# Patient Record
Sex: Female | Born: 1950 | Race: White | Hispanic: No | Marital: Married | Smoking: Never smoker
Health system: Southern US, Community
[De-identification: ages and names within clinical notes are randomized; demographics above are authoritative.]

## PROBLEM LIST (undated history)

## (undated) DIAGNOSIS — I82409 Acute embolism and thrombosis of unspecified deep veins of unspecified lower extremity: Secondary | ICD-10-CM

## (undated) DIAGNOSIS — K219 Gastro-esophageal reflux disease without esophagitis: Secondary | ICD-10-CM

## (undated) DIAGNOSIS — R112 Nausea with vomiting, unspecified: Secondary | ICD-10-CM

## (undated) DIAGNOSIS — J189 Pneumonia, unspecified organism: Secondary | ICD-10-CM

## (undated) DIAGNOSIS — E119 Type 2 diabetes mellitus without complications: Secondary | ICD-10-CM

## (undated) DIAGNOSIS — F32A Depression, unspecified: Secondary | ICD-10-CM

## (undated) DIAGNOSIS — U071 COVID-19: Secondary | ICD-10-CM

## (undated) DIAGNOSIS — I499 Cardiac arrhythmia, unspecified: Secondary | ICD-10-CM

## (undated) DIAGNOSIS — F419 Anxiety disorder, unspecified: Secondary | ICD-10-CM

## (undated) DIAGNOSIS — R51 Headache: Secondary | ICD-10-CM

## (undated) DIAGNOSIS — Z9889 Other specified postprocedural states: Secondary | ICD-10-CM

## (undated) DIAGNOSIS — J45909 Unspecified asthma, uncomplicated: Secondary | ICD-10-CM

## (undated) DIAGNOSIS — R519 Headache, unspecified: Secondary | ICD-10-CM

## (undated) DIAGNOSIS — M1990 Unspecified osteoarthritis, unspecified site: Secondary | ICD-10-CM

## (undated) DIAGNOSIS — T4145XA Adverse effect of unspecified anesthetic, initial encounter: Secondary | ICD-10-CM

## (undated) DIAGNOSIS — T8859XA Other complications of anesthesia, initial encounter: Secondary | ICD-10-CM

## (undated) HISTORY — PX: EYE SURGERY: SHX253

## (undated) HISTORY — PX: FINGER SURGERY: SHX640

## (undated) HISTORY — PX: ABDOMINAL HYSTERECTOMY: SHX81

## (undated) HISTORY — PX: TOTAL HIP ARTHROPLASTY: SHX124

## (undated) HISTORY — PX: SHOULDER ARTHROSCOPY: SHX128

---

## 1998-12-07 ENCOUNTER — Ambulatory Visit (HOSPITAL_BASED_OUTPATIENT_CLINIC_OR_DEPARTMENT_OTHER): Admission: RE | Admit: 1998-12-07 | Discharge: 1998-12-07 | Payer: Self-pay | Admitting: Orthopedic Surgery

## 1998-12-29 ENCOUNTER — Ambulatory Visit (HOSPITAL_BASED_OUTPATIENT_CLINIC_OR_DEPARTMENT_OTHER): Admission: RE | Admit: 1998-12-29 | Discharge: 1998-12-29 | Payer: Self-pay | Admitting: Orthopedic Surgery

## 1999-01-14 ENCOUNTER — Encounter: Admission: RE | Admit: 1999-01-14 | Discharge: 1999-03-02 | Payer: Self-pay | Admitting: Family Medicine

## 1999-09-28 ENCOUNTER — Encounter: Payer: Self-pay | Admitting: Orthopedic Surgery

## 1999-10-04 ENCOUNTER — Inpatient Hospital Stay (HOSPITAL_COMMUNITY): Admission: RE | Admit: 1999-10-04 | Discharge: 1999-10-07 | Payer: Self-pay | Admitting: Orthopedic Surgery

## 1999-11-05 ENCOUNTER — Encounter: Admission: RE | Admit: 1999-11-05 | Discharge: 2000-02-03 | Payer: Self-pay | Admitting: Orthopedic Surgery

## 2000-03-20 ENCOUNTER — Inpatient Hospital Stay (HOSPITAL_COMMUNITY): Admission: RE | Admit: 2000-03-20 | Discharge: 2000-03-23 | Payer: Self-pay | Admitting: Orthopedic Surgery

## 2000-04-18 ENCOUNTER — Encounter: Admission: RE | Admit: 2000-04-18 | Discharge: 2000-05-22 | Payer: Self-pay | Admitting: Orthopedic Surgery

## 2000-05-16 HISTORY — PX: JOINT REPLACEMENT: SHX530

## 2001-02-28 ENCOUNTER — Encounter: Payer: Self-pay | Admitting: Orthopedic Surgery

## 2001-02-28 ENCOUNTER — Inpatient Hospital Stay (HOSPITAL_COMMUNITY): Admission: RE | Admit: 2001-02-28 | Discharge: 2001-03-02 | Payer: Self-pay | Admitting: Orthopedic Surgery

## 2005-04-05 ENCOUNTER — Encounter: Admission: RE | Admit: 2005-04-05 | Discharge: 2005-04-12 | Payer: Self-pay | Admitting: Orthopedic Surgery

## 2005-08-24 ENCOUNTER — Encounter (HOSPITAL_COMMUNITY): Admission: RE | Admit: 2005-08-24 | Discharge: 2005-09-23 | Payer: Self-pay | Admitting: Orthopedic Surgery

## 2012-12-24 ENCOUNTER — Emergency Department (HOSPITAL_COMMUNITY)
Admission: EM | Admit: 2012-12-24 | Discharge: 2012-12-24 | Disposition: A | Discharge status: Home / Self Care | Payer: Medicare Other | Attending: Emergency Medicine | Admitting: Emergency Medicine

## 2012-12-24 ENCOUNTER — Encounter (HOSPITAL_COMMUNITY): Payer: Self-pay | Admitting: *Deleted

## 2012-12-24 ENCOUNTER — Emergency Department (HOSPITAL_COMMUNITY): Payer: Medicare Other

## 2012-12-24 ENCOUNTER — Encounter (HOSPITAL_COMMUNITY): Payer: Self-pay | Admitting: Registered Nurse

## 2012-12-24 ENCOUNTER — Emergency Department (HOSPITAL_COMMUNITY): Payer: Medicare Other | Admitting: Registered Nurse

## 2012-12-24 ENCOUNTER — Encounter (HOSPITAL_COMMUNITY)
Admission: EM | Disposition: A | Discharge status: Home / Self Care | Payer: Self-pay | Source: Home / Self Care | Attending: Emergency Medicine

## 2012-12-24 DIAGNOSIS — S73004A Unspecified dislocation of right hip, initial encounter: Secondary | ICD-10-CM

## 2012-12-24 DIAGNOSIS — X58XXXA Exposure to other specified factors, initial encounter: Secondary | ICD-10-CM | POA: Insufficient documentation

## 2012-12-24 DIAGNOSIS — Z96649 Presence of unspecified artificial hip joint: Secondary | ICD-10-CM | POA: Insufficient documentation

## 2012-12-24 DIAGNOSIS — T84029A Dislocation of unspecified internal joint prosthesis, initial encounter: Secondary | ICD-10-CM | POA: Insufficient documentation

## 2012-12-24 HISTORY — PX: HIP CLOSED REDUCTION: SHX983

## 2012-12-24 HISTORY — DX: Type 2 diabetes mellitus without complications: E11.9

## 2012-12-24 LAB — GLUCOSE, CAPILLARY: Glucose-Capillary: 144 mg/dL — ABNORMAL HIGH (ref 70–99)

## 2012-12-24 SURGERY — CLOSED REDUCTION, HIP
Anesthesia: General | Site: Hip | Laterality: Right | Wound class: Clean

## 2012-12-24 MED ORDER — SUCCINYLCHOLINE CHLORIDE 20 MG/ML IJ SOLN
INTRAMUSCULAR | Status: DC | PRN
  Administered 2012-12-24: 140 mg via INTRAVENOUS

## 2012-12-24 MED ORDER — FENTANYL CITRATE 0.05 MG/ML IJ SOLN: 25.0000 ug | INTRAMUSCULAR | Status: DC | PRN

## 2012-12-24 MED ORDER — PROMETHAZINE HCL 25 MG/ML IJ SOLN: 6.2500 mg | INTRAMUSCULAR | Status: DC | PRN

## 2012-12-24 MED ORDER — PROPOFOL 10 MG/ML IV BOLUS
0.5000 mg/kg | Freq: Once | INTRAVENOUS | Status: AC
  Administered 2012-12-24: 120 mg via INTRAVENOUS
  Filled 2012-12-24: qty 1

## 2012-12-24 MED ORDER — PROPOFOL 10 MG/ML IV BOLUS
INTRAVENOUS | Status: DC | PRN
  Administered 2012-12-24: 200 mg via INTRAVENOUS

## 2012-12-24 MED ORDER — HYDROMORPHONE HCL PF 1 MG/ML IJ SOLN
1.0000 mg | Freq: Once | INTRAMUSCULAR | Status: AC
  Administered 2012-12-24: 1 mg via INTRAVENOUS
  Filled 2012-12-24: qty 1

## 2012-12-24 MED ORDER — LACTATED RINGERS IV SOLN
INTRAVENOUS | Status: DC | PRN
  Administered 2012-12-24: 22:00:00 via INTRAVENOUS

## 2012-12-24 MED ORDER — ONDANSETRON HCL 4 MG/2ML IJ SOLN
INTRAMUSCULAR | Status: DC | PRN
  Administered 2012-12-24: 4 mg via INTRAVENOUS

## 2012-12-24 MED ORDER — ONDANSETRON HCL 4 MG/2ML IJ SOLN
4.0000 mg | Freq: Once | INTRAMUSCULAR | Status: AC
  Administered 2012-12-24: 4 mg via INTRAVENOUS
  Filled 2012-12-24: qty 2

## 2012-12-24 MED ORDER — LIDOCAINE HCL (CARDIAC) 20 MG/ML IV SOLN
INTRAVENOUS | Status: DC | PRN
  Administered 2012-12-24: 30 mg via INTRAVENOUS

## 2012-12-24 SURGICAL SUPPLY — 29 items
BAG ZIPLOCK 12X15 (MISCELLANEOUS) IMPLANT
CLOTH BEACON ORANGE TIMEOUT ST (SAFETY) IMPLANT
CUFF TOURN SGL QUICK 34 (TOURNIQUET CUFF)
CUFF TRNQT CYL 34X4X40X1 (TOURNIQUET CUFF) IMPLANT
DRAPE C-ARM 42X120 X-RAY (DRAPES) IMPLANT
DRAPE U-SHAPE 47X51 STRL (DRAPES) IMPLANT
DRSG ADAPTIC 3X8 NADH LF (GAUZE/BANDAGES/DRESSINGS) IMPLANT
DRSG PAD ABDOMINAL 8X10 ST (GAUZE/BANDAGES/DRESSINGS) IMPLANT
DURAPREP 26ML APPLICATOR (WOUND CARE) IMPLANT
ELECT REM PT RETURN 9FT ADLT (ELECTROSURGICAL)
ELECTRODE REM PT RTRN 9FT ADLT (ELECTROSURGICAL) IMPLANT
GLOVE ECLIPSE 7.5 STRL STRAW (GLOVE) IMPLANT
IMMOBILIZER KNEE 20 (SOFTGOODS) ×2
IMMOBILIZER KNEE 20 THIGH 36 (SOFTGOODS) ×1 IMPLANT
KIT BASIN OR (CUSTOM PROCEDURE TRAY) IMPLANT
MANIFOLD NEPTUNE II (INSTRUMENTS) IMPLANT
NS IRRIG 1000ML POUR BTL (IV SOLUTION) IMPLANT
PACK LOWER EXTREMITY WL (CUSTOM PROCEDURE TRAY) IMPLANT
PAD CAST 4YDX4 CTTN HI CHSV (CAST SUPPLIES) IMPLANT
PADDING CAST COTTON 4X4 STRL (CAST SUPPLIES)
POSITIONER SURGICAL ARM (MISCELLANEOUS) IMPLANT
SOL PREP PROV IODINE SCRUB 4OZ (MISCELLANEOUS) IMPLANT
SPONGE GAUZE 4X4 12PLY (GAUZE/BANDAGES/DRESSINGS) IMPLANT
STRIP CLOSURE SKIN 1/2X4 (GAUZE/BANDAGES/DRESSINGS) IMPLANT
SUT MNCRL AB 4-0 PS2 18 (SUTURE) IMPLANT
SUT VIC AB 1 CT1 27 (SUTURE)
SUT VIC AB 1 CT1 27XBRD ANTBC (SUTURE) IMPLANT
SUT VIC AB 2-0 CT1 27 (SUTURE)
SUT VIC AB 2-0 CT1 TAPERPNT 27 (SUTURE) IMPLANT

## 2012-12-24 NOTE — ED Notes (Signed)
Per ems: pt from home, slipped and fell on her porch today. Right hip pain. Deformity to hip, shortening and rotation noted. 10 mg morphine given in route. Hx of right hip replacement. bp 102/70, pulse 74, respirations 14, sao2 98%, cbg 134

## 2012-12-24 NOTE — Consult Note (Signed)
Reason for Consult: R hip dislocation Referring Physician: ED Anne Shutter  Regina Munoz is an 62 y.o. female.  HPI: She presented to the ED today by EMS after slipping on her porch and falling onto her R hip. She had immediate onset of pain and could not rise up or bare weight on her RLE. Her husband called EMS and she was transported to the ED. Radiographs were obtained and she was found to have R hip dislocation and ortho was consulted. She reports pain about the R hip and groin but denies radiation down the leg. Denies numbness or tingling. She denies LOC. She describes her R hip pain as severe, sharp, stabbing and aching. She cannot move the RLE without pain. She reports having a R THR approx 10 years ago and has no history of dislocations in the past. The ED has attempted to reduce without success. Pain in currently minimal and has improved.   Past Medical History  Diagnosis Date  . Diabetes mellitus without complication     Past Surgical History  Procedure Laterality Date  . Total hip arthroplasty    . Abdominal hysterectomy    . Joint replacement      right and left knees    History reviewed. No pertinent family history.  Social History:  reports that she has never smoked. She does not have any smokeless tobacco history on file. She reports that she does not drink alcohol or use illicit drugs.She lives at home with her husband and is retired  Allergies: No Known Allergies  Medications: No current facility-administered medications for this encounter. Current outpatient prescriptions:aspirin EC 81 MG tablet, Take 81 mg by mouth daily., Disp: , Rfl: ;  cholecalciferol (VITAMIN D) 1000 UNITS tablet, Take 1,000 Units by mouth daily., Disp: , Rfl: ;  citalopram (CELEXA) 20 MG tablet, Take 20 mg by mouth daily., Disp: , Rfl: ;  linagliptin (TRADJENTA) 5 MG TABS tablet, Take 5 mg by mouth daily., Disp: , Rfl: ;  omeprazole (PRILOSEC) 20 MG capsule, Take 20 mg by mouth daily., Disp: ,  Rfl:   BP 125/56  Pulse 80  Temp(Src) 97.7 F (36.5 C) (Oral)  Resp 15  Ht 5\' 5"  (1.651 m)  Wt 117.935 kg (260 lb)  BMI 43.27 kg/m2  SpO2 99%   Dg Hip Complete Right  12/24/2012   *RADIOLOGY REPORT*  Clinical Data: Fall.  Right hip pain.  RIGHT HIP - COMPLETE 2+ VIEW  Comparison: None.  Findings: Posterior right hip dislocation is present.  Right total hip arthroplasty.  Pelvic rings grossly appear intact.  No periprosthetic fracture is identified.  IMPRESSION: Posterior dislocation of the right total hip arthroplasty.   Original Report Authenticated By: Andreas Newport, M.D.    Review of Systems  Constitutional: Negative for fever, chills and malaise/fatigue.  Musculoskeletal: Positive for joint pain and falls.  Skin: Negative.   Neurological: Negative for dizziness and weakness.   Blood pressure 118/63, pulse 79, temperature 97.7 F (36.5 C), temperature source Oral, resp. rate 18, height 5\' 5"  (1.651 m), weight 117.935 kg (260 lb), SpO2 97.00%. Physical Exam  Constitutional: She is oriented to person, place, and time. She appears well-developed and well-nourished. No distress.  HENT:  Head: Normocephalic and atraumatic.  Eyes: EOM are normal.  Neck: Normal range of motion.  Cardiovascular: Intact distal pulses.   Respiratory: Effort normal.  Musculoskeletal:       Right hip: She exhibits decreased range of motion, decreased strength, tenderness and deformity. She  exhibits no bony tenderness, no swelling and no laceration.  R leg shortened and rotated, R hip displaced  Neurological: She is alert and oriented to person, place, and time.  Skin: Skin is warm and dry. She is not diaphoretic. No erythema.  Psychiatric: She has a normal mood and affect. Her behavior is normal. Judgment and thought content normal.    Assessment/Plan: R posterior hip displacement S/P R THA by Dr Turner Daniels 10 yrs previous, failure to reduce in OR  -Plan to take to OR for reduction under anesthesia. I  did discuss risks of failure of reduction, nerve injury, recurrent dislocation, fracture, and vascular injury  -F/U with Dr. Turner Daniels in the office in 1-2 weeks  Regina Munoz 12/24/2012, 9:37 PM

## 2012-12-24 NOTE — ED Notes (Signed)
Pt is awake and alert and complaining right hip pain..  Unable to to do closed reduction of right hip-Dr. Denton Lank made aware pt requesting pain meds.  Husband at bedside. VSS

## 2012-12-24 NOTE — ED Provider Notes (Signed)
CSN: [id number]     Arrival date & time 12/24/12  1836 History     First MD Initiated Contact with Patient 12/24/12 1840     Chief Complaint  Patient presents with  . Hip Injury  . Fall   (Consider location/radiation/quality/duration/timing/severity/associated sxs/prior Treatment) HPI Comments: Patient with a history of right hip replacement presents with a chief complaint of right hip pain and deformity.  She states that just prior to arrival she slipped on wet pavement and fell to the ground.  When she fell her right leg went behind her.  She has not been able to move her right hip since that time.  She has not been ambulatory since the fall.  She denies hitting her head.  No LOC.  She is not having pain anywhere aside from the right hip.  She denies numbness or tingling.  She reports that her right hip replacement was performed by Dr. Turner Daniels approximately ten years ago.  She denies any prior hip dislocations.    The history is provided by the patient.    Past Medical History  Diagnosis Date  . Diabetes mellitus without complication    Past Surgical History  Procedure Laterality Date  . Total hip arthroplasty    . Abdominal hysterectomy     No family history on file. History  Substance Use Topics  . Smoking status: Never Smoker   . Smokeless tobacco: Not on file  . Alcohol Use: No   OB History   Grav Para Term Preterm Abortions TAB SAB Ect Mult Living                 Review of Systems  Musculoskeletal:       Right hip pain    Allergies  Review of patient's allergies indicates no known allergies.  Home Medications   Current Outpatient Rx  Name  Route  Sig  Dispense  Refill  . aspirin EC 81 MG tablet   Oral   Take 81 mg by mouth daily.         . cholecalciferol (VITAMIN D) 1000 UNITS tablet   Oral   Take 1,000 Units by mouth daily.         . citalopram (CELEXA) 20 MG tablet   Oral   Take 20 mg by mouth daily.         Marland Kitchen linagliptin (TRADJENTA) 5 MG  TABS tablet   Oral   Take 5 mg by mouth daily.         Marland Kitchen omeprazole (PRILOSEC) 20 MG capsule   Oral   Take 20 mg by mouth daily.          BP 110/75  Pulse 83  Temp(Src) 97.7 F (36.5 C) (Oral)  Resp 18  Ht 5\' 5"  (1.651 m)  Wt 260 lb (117.935 kg)  BMI 43.27 kg/m2  SpO2 97% Physical Exam  Nursing note and vitals reviewed. Constitutional: She appears well-developed and well-nourished.  HENT:  Head: Normocephalic and atraumatic.  Eyes: EOM are normal. Pupils are equal, round, and reactive to light.  Neck: Normal range of motion. Neck supple.  Cardiovascular: Normal rate, regular rhythm and normal heart sounds.   Pulses:      Dorsalis pedis pulses are 2+ on the right side, and 2+ on the left side.  Pulmonary/Chest: Effort normal and breath sounds normal.  Musculoskeletal:       Right hip: She exhibits decreased range of motion, bony tenderness and deformity. She exhibits no laceration.  Right leg shortened and rotated.    Neurological: She is alert.  Sensation of right foot intact  Skin: Skin is warm and dry.  Psychiatric: She has a normal mood and affect.    ED Course   Reduction of dislocation Date/Time: 12/25/2012 12:50 AM Performed by: Suzi Roots Authorized by: Anne Shutter, Herbert Seta Consent: Verbal consent obtained. written consent obtained. Risks and benefits: risks, benefits and alternatives were discussed Consent given by: patient Patient understanding: patient states understanding of the procedure being performed Patient consent: the patient's understanding of the procedure matches consent given Patient identity confirmed: verbally with patient and arm band Time out: Immediately prior to procedure a "time out" was called to verify the correct patient, procedure, equipment, support staff and site/side marked as required. Local anesthesia used: no Patient sedated: yes Sedation type: moderate (conscious) sedation Sedatives: propofol Vitals: Vital signs  were monitored during sedation. Patient tolerance: Patient tolerated the procedure well with no immediate complications.   (including critical care time)  Labs Reviewed - No data to display Dg Hip Complete Right  12/24/2012   *RADIOLOGY REPORT*  Clinical Data: Fall.  Right hip pain.  RIGHT HIP - COMPLETE 2+ VIEW  Comparison: None.  Findings: Posterior right hip dislocation is present.  Right total hip arthroplasty.  Pelvic rings grossly appear intact.  No periprosthetic fracture is identified.  IMPRESSION: Posterior dislocation of the right total hip arthroplasty.   Original Report Authenticated By: Andreas Newport, M.D.   No diagnosis found.  9:00 PM Discussed with Dr. Yevette Edwards with Orthopedics.  He states that he will arrange hip reduction in the OR.  MDM  Patient with a history of right hip replacement presents with right hip dislocation.  Patient neurovascularly intact.  Hip dislocation reduction attempted by Dr. Denton Lank without success.  Dr. Yevette Edwards consulted to perform reduction in the OR.  Pascal Lux [place], PA-C 12/25/12 [phone]

## 2012-12-24 NOTE — Anesthesia Preprocedure Evaluation (Addendum)
Anesthesia Evaluation  Patient identified by MRN, date of birth, ID band Patient awake    Reviewed: Allergy & Precautions, H&P , NPO status , Patient's Chart, lab work & pertinent test results  History of Anesthesia Complications (+) MALIGNANT HYPERTHERMIA  Airway Mallampati: II TM Distance: <3 FB Neck ROM: Full    Dental no notable dental hx.    Pulmonary neg pulmonary ROS,  breath sounds clear to auscultation  Pulmonary exam normal       Cardiovascular negative cardio ROS  Rhythm:Regular Rate:Normal     Neuro/Psych negative neurological ROS  negative psych ROS   GI/Hepatic negative GI ROS, Neg liver ROS,   Endo/Other  diabetesMorbid obesity  Renal/GU negative Renal ROS  negative genitourinary   Musculoskeletal negative musculoskeletal ROS (+)   Abdominal   Peds negative pediatric ROS (+)  Hematology negative hematology ROS (+)   Anesthesia Other Findings   Reproductive/Obstetrics negative OB ROS                          Anesthesia Physical Anesthesia Plan  ASA: III  Anesthesia Plan: General   Post-op Pain Management:    Induction: Intravenous  Airway Management Planned: Oral ETT  Additional Equipment:   Intra-op Plan:   Post-operative Plan:   Informed Consent: I have reviewed the patients History and Physical, chart, labs and discussed the procedure including the risks, benefits and alternatives for the proposed anesthesia with the patient or authorized representative who has indicated his/her understanding and acceptance.   Dental advisory given  Plan Discussed with: CRNA and Surgeon  Anesthesia Plan Comments:        Anesthesia Quick Evaluation

## 2012-12-24 NOTE — Transfer of Care (Signed)
Immediate Anesthesia Transfer of Care Note  Patient: Regina Munoz  Procedure(s) Performed: Procedure(s): CLOSED REDUCTION HIP (Right)  Patient Location: PACU  Anesthesia Type:General  Level of Consciousness: awake, alert  and oriented  Airway & Oxygen Therapy: Patient Spontanous Breathing and Patient connected to face mask oxygen  Post-op Assessment: Report given to PACU RN and Post -op Vital signs reviewed and stable  Post vital signs: stable  Complications: No apparent anesthesia complications

## 2012-12-24 NOTE — ED Notes (Signed)
Pt remains awake and alert-MP NSR with rate 80 and no ectopy-O2 sat 100 % on O2 5l/[place] BP 117/63-IV intact right ACF/husband at bedside

## 2012-12-24 NOTE — ED Notes (Signed)
Bed:[facility]ALA<BR> Expected date:<BR> Expected time:<BR> Means of arrival:<BR> Comments:<BR> ems- right hip pain

## 2012-12-24 NOTE — ED Notes (Signed)
Pt received total 120 mg propofol IV during procedure

## 2012-12-24 NOTE — Progress Notes (Signed)
Pt does not use a computer. Pt does not want to sign up for My Chart. Briscoe Burns BSN, RN-BC Admissions RN  12/24/2012 9:09 PM

## 2012-12-24 NOTE — Preoperative (Signed)
Beta Blockers   Reason not to administer Beta Blockers:Not Applicable 

## 2012-12-25 ENCOUNTER — Encounter (HOSPITAL_COMMUNITY): Payer: Self-pay | Admitting: Orthopedic Surgery

## 2012-12-25 MED ORDER — MIDAZOLAM HCL 5 MG/5ML IJ SOLN
INTRAMUSCULAR | Status: DC | PRN
  Administered 2012-12-24: 2 mg via INTRAVENOUS

## 2012-12-25 NOTE — Op Note (Signed)
NAME:  Regina Munoz, Regina Munoz NO.:  [id number]  MEDICAL RECORD NO.:  [id number]  LOCATION:  [facility]                         FACILITY:  [facility]  PHYSICIAN:  Estill Bamberg, MD      DATE OF BIRTH:  Jul 05, 1950  DATE OF PROCEDURE:  12/24/2012 DATE OF DISCHARGE:  12/24/2012                              OPERATIVE REPORT   PREOPERATIVE DIAGNOSIS:  Posterior right hip dislocation.  POSTOPERATIVE DIAGNOSIS:  Posterior right hip dislocation.  PROCEDURE:  Reduction of dislocated right total hip arthroplasty with general anesthesia.  SURGEON:  Estill Bamberg, MD  ASSISTANT:  None.  ANESTHESIA:  General endotracheal anesthesia.  COMPLICATIONS:  None.  DISPOSITION:  Stable.  ESTIMATED BLOOD LOSS:  Minimal.  INDICATIONS FOR PROCEDURE:  Briefly, [name] is a pleasant 62 year old female who is approximately 10 years status post a right total hip replacement.  The patient did slip on a wet surface earlier today.  Her right lower extremity did perform a twisting maneuver.  She did notice an inability to bear weight and a deformity of her right lower extremity was noted.  After the patient's presentation to the [facility] Emergency Department, she was evaluated by me.  Radiographs did reveal right posterior hip dislocation.  I did discuss a closed reduction and the patient did agree to proceed.  OPERATIVE DETAILS:  On December 24, 2012, the patient was brought to surgery and general endotracheal anesthesia was administered.  In the supine position, with an assistant holding counter pressure against the patient's pelvis, I did internally rotate the right hip, applied traction, and adducted the hip.  I then externally rotated the hip and I did hear an audible clunk.  At this point, the patient's leg lengths were symmetric.  I then obtained an AP of the pelvis using intraoperative fluoroscopy, and a concentric reduction of the patient's right total hip arthroplasty was noted.  At  this point, the patient was awoken from general endotracheal anesthesia and transferred to recovery in stable condition.  On the patient's postoperative examination, she was noted to be neurovascularly intact.  Postoperative plan is to discharge the patient with follow up with Dr. Turner Daniels in approximately 2 weeks.     Estill Bamberg, MD     MD/MEDQ  D:  12/24/2012  T:  12/25/2012  Job:  [id number]  cc:   Feliberto Gottron. Turner Daniels, M.D. Fax: [phone]

## 2012-12-25 NOTE — ED Provider Notes (Signed)
Medical screening examination/treatment/procedure(s) were conducted as a shared visit with non-physician practitioner(s) and myself.  I personally evaluated the patient during the encounter Pt s/p slip and fall. No loc. C/o right hip pain. Remote hx right tha. Dislocation on xr.  w procedural sedation, propofol, excellent sedation/relaxation, but unable to reduce hip.  Recheck, distal pulses palp, no numbness/weakness, RLE motor/sens intact. Ortho consulted - to or for reduction.   Suzi Roots, MD 12/25/12 2141

## 2012-12-27 NOTE — Anesthesia Postprocedure Evaluation (Signed)
  Anesthesia Post-op Note  Patient: Regina Munoz  Procedure(s) Performed: Procedure(s) (LRB): CLOSED REDUCTION HIP (Right)  Patient Location: PACU  Anesthesia Type: General  Level of Consciousness: awake and alert   Airway and Oxygen Therapy: Patient Spontanous Breathing  Post-op Pain: mild  Post-op Assessment: Post-op Vital signs reviewed, Patient's Cardiovascular Status Stable, Respiratory Function Stable, Patent Airway and No signs of Nausea or vomiting  Last Vitals:  Filed Vitals:   12/24/12 2330  BP: 116/61  Pulse: 72  Temp: 36.5 C  Resp: 18    Post-op Vital Signs: stable   Complications: No apparent anesthesia complications

## 2015-01-13 ENCOUNTER — Ambulatory Visit: Payer: Medicare Other | Attending: Orthopedic Surgery | Admitting: Physical Therapy

## 2015-01-13 DIAGNOSIS — M25552 Pain in left hip: Secondary | ICD-10-CM | POA: Diagnosis present

## 2015-01-13 NOTE — Therapy (Signed)
[facility] [address] [place], [place], [address] Phone: [phone]   Fax:  [phone]  Physical Therapy Evaluation  Patient Details  Name: Regina Munoz MRN: [id number] Date of Birth: Jan 09, 1951 Referring Provider:  Gean Birchwood, MD  Encounter Date: 01/13/2015      PT End of Session - 01/13/15 1439    Visit Number 1   Number of Visits 8   Date for PT Re-Evaluation 02/17/15   PT Start Time 1123   PT Stop Time 1207   PT Time Calculation (min) 44 min   Activity Tolerance Patient tolerated treatment well   Behavior During Therapy [facility] for tasks assessed/performed      Past Medical History  Diagnosis Date  . Diabetes mellitus without complication     Past Surgical History  Procedure Laterality Date  . Total hip arthroplasty    . Abdominal hysterectomy    . Joint replacement      right and left knees  . Hip closed reduction Right 12/24/2012    Procedure: CLOSED REDUCTION HIP;  Surgeon: Emilee Hero, MD;  Location: [facility];  Service: Orthopedics;  Laterality: Right;    There were no vitals filed for this visit.  Visit Diagnosis:  Left hip pain - Plan: PT plan of care cert/re-cert      Subjective Assessment - 01/13/15 1422    Subjective Sometimes the pain hits me so hard in my hip my legs give way.              [facility] PT Assessment - 01/13/15 0001    Assessment   Medical Diagnosis Trochanteric bursitis.   Onset Date/Surgical Date --  Several months.   Precautions   Precautions None   Restrictions   Weight Bearing Restrictions No   Balance Screen   Has the patient fallen in the past 6 months Yes   How many times? --  2   Has the patient had a decrease in activity level because of a fear of falling?  Yes   Is the patient reluctant to leave their home because of a fear of falling?  No   Home [employer] information [employer] manager residence   Prior Function   Level of Independence Independent   ROM / Strength   AROM / PROM / Strength AROM;Strength   AROM   Overall AROM Comments WFL for left hip.   Strength   Overall Strength Comments Left hip abduction= 4/5.   Palpation   Palpation comment Pain complaints "in" hip but no palpable pain around the patient's left hip.   Special Tests    Special Tests --  Positive left FABER test.   Ambulation/Gait   Gait Comments Trendelenburg type gait pattern.                   [facility] Adult PT Treatment/Exercise - 01/13/15 0001    Modalities   Modalities Ultrasound   Ultrasound   Ultrasound Location Left hip.   Ultrasound Parameters 1.50 W/CM2 x 10 minutes.  Patient was in the left sdly position with folded pillow between knees for comfort.                  PT Short Term Goals - 01/13/15 1435    PT SHORT TERM GOAL #1   Title Ind with HEP.   Time 2   Period Weeks   Status New           PT Long Term Goals -  2015-01-29 1435    PT LONG TERM GOAL #1   Title Perform ADL's with pain not > 3/10 in left hip.   Time 4   Period Weeks   Status New   PT LONG TERM GOAL #2   Title No episodes of intense pain causing legs to "give way."   Time 4   Period Weeks   Status New               Plan - 2015-01-29 1427    Clinical Impression Statement The patient reports a pain occuring in her left hip over the last several months that will come without notice and has been so intense that her knees have given way on 2 occasions.  When this occurs she states her pain is a 10/10.  She reports no areas around her left hip that she can touch that feels painful.  When asked if she has a h/o low back problems she stated "yes" and has had injections in the past.  She is not reporting any significant LBP today however.  She is not palpable tender in her low back or left SIJ region.   PT Frequency 2x / week   PT Duration 4 weeks   PT Treatment/Interventions Ultrasound;Iontophoresis 4mg /ml Dexamethasone;Manual techniques   PT Next  Visit Plan 1-1 left hip PROM (IR/ER); U/S and Ionto.          G-Codes - 29-Jan-2015 1413    Functional Assessment Tool Used Clinical judgement.   Functional Limitation Mobility: Walking and moving around   Mobility: Walking and Moving Around Current Status [phone]) At least 20 percent but less than 40 percent impaired, limited or restricted   Mobility: Walking and Moving Around Goal Status [phone]) At least 1 percent but less than 20 percent impaired, limited or restricted       Problem List There are no active problems to display for this patient.   Miklo Aken, [place] MPT 01/29/15, 2:42 PM  [facility] [address] [place], [place], [address] Phone: [phone]   Fax:  [phone]

## 2015-01-15 ENCOUNTER — Ambulatory Visit: Payer: Medicare Other | Attending: Orthopedic Surgery | Admitting: *Deleted

## 2015-01-15 ENCOUNTER — Encounter: Payer: Self-pay | Admitting: *Deleted

## 2015-01-15 DIAGNOSIS — M25552 Pain in left hip: Secondary | ICD-10-CM | POA: Insufficient documentation

## 2015-01-15 NOTE — Therapy (Signed)
[facility] [address] [place], [place], [address] Phone: [phone]   Fax:  [phone]  Physical Therapy Treatment  Patient Details  Name: Regina Munoz MRN: [id number] Date of Birth: 21-Nov-1950 Referring Provider:  Gean Birchwood, MD  Encounter Date: 01/15/2015      PT End of Session - 01/15/15 1130    Visit Number 2   Number of Visits 8   Date for PT Re-Evaluation 02/17/15   PT Start Time 1115   PT Stop Time 1200   PT Time Calculation (min) 45 min      Past Medical History  Diagnosis Date  . Diabetes mellitus without complication     Past Surgical History  Procedure Laterality Date  . Total hip arthroplasty    . Abdominal hysterectomy    . Joint replacement      right and left knees  . Hip closed reduction Right 12/24/2012    Procedure: CLOSED REDUCTION HIP;  Surgeon: Emilee Hero, MD;  Location: [facility];  Service: Orthopedics;  Laterality: Right;    There were no vitals filed for this visit.  Visit Diagnosis:  Left hip pain      Subjective Assessment - 01/15/15 1128    Subjective Sometimes the pain hits me so hard in my hip my  LT leg gives way.     Currently in Pain? No/denies  No pain most of the time                         [facility] Adult PT Treatment/Exercise - 01/15/15 0001    Modalities   Modalities Ultrasound;Iontophoresis   Ultrasound   Ultrasound Location LT knee   Ultrasound Parameters 1.5 w/cm2 x10 mins RT sidelying   Iontophoresis   Type of Iontophoresis Dexamethasone   Location LT hip   Dose 2 ML, 80 ma/min   Time 8 min,  4 hour patch   Manual Therapy   Manual Therapy Soft tissue mobilization;Myofascial release;Passive ROM   Myofascial Release IASTM around LT hip with Pt RT sidelying   Passive ROM AAROM for ABD LT LE 3x10                  PT Short Term Goals - 01/13/15 1435    PT SHORT TERM GOAL #1   Title Ind with HEP.   Time 2   Period Weeks   Status New           PT Long Term Goals - 01/13/15 1435    PT LONG TERM GOAL #1   Title Perform ADL's with pain not > 3/10 in left hip.   Time 4   Period Weeks   Status New   PT LONG TERM GOAL #2   Title No episodes of intense pain causing legs to "give way."   Time 4   Period Weeks   Status New               Plan - 01/15/15 1210    Clinical Impression Statement Pt did fairly well with Rx and wasn't very sore with STW. She did have a little challenge with Hip ABduction. Goals are ongoing.   PT Frequency 2x / week   PT Duration 4 weeks   PT Treatment/Interventions Ultrasound;Iontophoresis /ml Dexamethasone;Manual techniques   PT Next Visit Plan 1-1 left hip PROM (IR/ER) and ABduction; U/S and Ionto.        Problem List There are no active problems to  display for this patient.   Eleyna Brugh,CHRIS, PTA 01/15/2015, 12:19 PM  [facility] [address] [place], [place], [address] Phone: [phone]   Fax:  [phone]

## 2015-01-20 ENCOUNTER — Ambulatory Visit: Payer: Medicare Other | Admitting: *Deleted

## 2015-01-20 DIAGNOSIS — M25552 Pain in left hip: Secondary | ICD-10-CM

## 2015-01-20 NOTE — Therapy (Addendum)
[facility] [place], [place], [address] Phone: [phone]   Fax:  [phone]  Physical Therapy Treatment  Patient Details  Name: Regina Munoz MRN: [id number] Date of Birth: 1951/05/12 Referring Provider:  Frederik Pear, MD  Encounter Date: 01/20/2015    Past Medical History:  Diagnosis Date  . Anxiety   . Arthritis   . Asthma   . Complication of anesthesia   . Diabetes mellitus without complication ([place])   . Dysrhythmia    palpitations ->20 yrs stress test neg nothing since  . GERD (gastroesophageal reflux disease)   . Headache   . PONV (postoperative nausea and vomiting)     Past Surgical History:  Procedure Laterality Date  . ABDOMINAL HYSTERECTOMY    . ANTERIOR CERVICAL DECOMP/DISCECTOMY FUSION N/A 02/17/2016   Procedure: ANTERIOR CERVICAL DECOMPRESSION/DISCECTOMY FUSION 1 LEVEL C4-5;  Surgeon: Melina Schools, MD;  Location: [facility];  Service: Orthopedics;  Laterality: N/A;  . EYE SURGERY Bilateral    cataracts  . HIP CLOSED REDUCTION Right 12/24/2012   Procedure: CLOSED REDUCTION HIP;  Surgeon: Sinclair Ship, MD;  Location: [facility];  Service: Orthopedics;  Laterality: Right;  . JOINT REPLACEMENT  2002   right and left knees  . TOTAL HIP ARTHROPLASTY Right    2002    There were no vitals filed for this visit.  Visit Diagnosis:  Left hip pain                                 PT Short Term Goals - 01/13/15 1435      PT SHORT TERM GOAL #1   Title Ind with HEP.   Time 2   Period Weeks   Status New           PT Long Term Goals - 01/13/15 1435      PT LONG TERM GOAL #1   Title Perform ADL's with pain not > 3/10 in left hip.   Time 4   Period Weeks   Status New     PT LONG TERM GOAL #2   Title No episodes of intense pain causing legs to "give way."   Time 4   Period Weeks   Status New               Problem List Patient Active Problem List   Diagnosis Date Noted  . Neck pain 02/17/2016    APPLEGATE, [place], PTA 03/21/2016, 3:53 PM  [facility] [place], [place], [address] Phone: [phone]   Fax:  [phone]  PHYSICAL THERAPY DISCHARGE SUMMARY  Visits from Start of Care: 3.  Current functional level related to goals / functional outcomes: Please see above.   Remaining deficits: Continued left hip pain.   Education / Equipment: HEP.  Plan: Patient agrees to discharge.  Patient goals were not met. Patient is being discharged due to meeting the stated rehab goals.  ?????        Mali Applegate MPT

## 2016-01-28 ENCOUNTER — Ambulatory Visit: Payer: Self-pay | Admitting: Physician Assistant

## 2016-02-10 ENCOUNTER — Encounter (HOSPITAL_COMMUNITY)
Admission: RE | Admit: 2016-02-10 | Discharge: 2016-02-10 | Disposition: A | Discharge status: Home / Self Care | Payer: Medicare HMO | Source: Ambulatory Visit | Attending: Orthopedic Surgery | Admitting: Orthopedic Surgery

## 2016-02-10 ENCOUNTER — Encounter (HOSPITAL_COMMUNITY): Payer: Self-pay

## 2016-02-10 DIAGNOSIS — Z01812 Encounter for preprocedural laboratory examination: Secondary | ICD-10-CM | POA: Diagnosis present

## 2016-02-10 DIAGNOSIS — J45909 Unspecified asthma, uncomplicated: Secondary | ICD-10-CM | POA: Diagnosis not present

## 2016-02-10 DIAGNOSIS — Z01810 Encounter for preprocedural cardiovascular examination: Secondary | ICD-10-CM | POA: Diagnosis present

## 2016-02-10 DIAGNOSIS — E119 Type 2 diabetes mellitus without complications: Secondary | ICD-10-CM | POA: Insufficient documentation

## 2016-02-10 DIAGNOSIS — M5412 Radiculopathy, cervical region: Secondary | ICD-10-CM | POA: Insufficient documentation

## 2016-02-10 DIAGNOSIS — K219 Gastro-esophageal reflux disease without esophagitis: Secondary | ICD-10-CM | POA: Diagnosis not present

## 2016-02-10 HISTORY — DX: Other specified postprocedural states: Z98.890

## 2016-02-10 HISTORY — DX: Headache, unspecified: R51.9

## 2016-02-10 HISTORY — DX: Unspecified osteoarthritis, unspecified site: M19.90

## 2016-02-10 HISTORY — DX: Headache: R51

## 2016-02-10 HISTORY — DX: Unspecified asthma, uncomplicated: J45.909

## 2016-02-10 HISTORY — DX: Cardiac arrhythmia, unspecified: I49.9

## 2016-02-10 HISTORY — DX: Gastro-esophageal reflux disease without esophagitis: K21.9

## 2016-02-10 HISTORY — DX: Nausea with vomiting, unspecified: R11.2

## 2016-02-10 HISTORY — DX: Anxiety disorder, unspecified: F41.9

## 2016-02-10 HISTORY — DX: Adverse effect of unspecified anesthetic, initial encounter: T41.45XA

## 2016-02-10 HISTORY — DX: Other complications of anesthesia, initial encounter: T88.59XA

## 2016-02-10 LAB — CBC
HEMATOCRIT: 37.3 % (ref 36.0–46.0)
Hemoglobin: 11.4 g/dL — ABNORMAL LOW (ref 12.0–15.0)
MCH: 24.7 pg — ABNORMAL LOW (ref 26.0–34.0)
MCHC: 30.6 g/dL (ref 30.0–36.0)
MCV: 80.9 fL (ref 78.0–100.0)
Platelets: 197 10*3/uL (ref 150–400)
RBC: 4.61 MIL/uL (ref 3.87–5.11)
RDW: 17.4 % — AB (ref 11.5–15.5)
WBC: 7.6 10*3/uL (ref 4.0–10.5)

## 2016-02-10 LAB — SURGICAL PCR SCREEN
MRSA, PCR: POSITIVE — AB
STAPHYLOCOCCUS AUREUS: POSITIVE — AB

## 2016-02-10 LAB — BASIC METABOLIC PANEL
Anion gap: 8 (ref 5–15)
BUN: 9 mg/dL (ref 6–20)
CALCIUM: 9.6 mg/dL (ref 8.9–10.3)
CO2: 24 mmol/L (ref 22–32)
CREATININE: 0.82 mg/dL (ref 0.44–1.00)
Chloride: 107 mmol/L (ref 101–111)
GFR calc non Af Amer: >60 mL/min (ref >60)
GLUCOSE: 101 mg/dL — AB (ref 65–99)
Potassium: 4.2 mmol/L (ref 3.5–5.1)
Sodium: 139 mmol/L (ref 135–145)

## 2016-02-10 LAB — GLUCOSE, CAPILLARY: GLUCOSE-CAPILLARY: 109 mg/dL — AB (ref 65–99)

## 2016-02-10 NOTE — Pre-Procedure Instructions (Addendum)
Regina Munoz  02/10/2016      [facility] [place] - [address] [place] [address] Phone: [phone] Fax: [phone]  [facility] [address] [place] - [address] [place] [address] [place] [address] Phone: [phone] Fax: [phone]    Your procedure is scheduled on 02/17/16.  Report to [facility] Admitting at 630 A.M.  Call this number if you have problems the morning of surgery:  [phone]   Remember:  Do not eat food or drink liquids after midnight.  Take these medicines the morning of surgery with A SIP OF WATER inhaler if needed(bring with you),celexa,prilosec, hydrocodone if needed  STOP all herbel meds, nsaids (aleve,naproxen,advil,ibuprofen)  starting Now including aspirin,all vitamins,Phenteramine      How to Manage Your Diabetes Before and After Surgery  Why is it important to control my blood sugar before and after surgery? . Improving blood sugar levels before and after surgery helps healing and can limit problems. . A way of improving blood sugar control is eating a healthy diet by: o  Eating less sugar and carbohydrates o  Increasing activity/exercise o  Talking with your doctor about reaching your blood sugar goals . High blood sugars (greater than 180 mg/dL) can raise your risk of infections and slow your recovery, so you will need to focus on controlling your diabetes during the weeks before surgery. . Make sure that the doctor who takes care of your diabetes knows about your planned surgery including the date and location.  How do I manage my blood sugar before surgery? . Check your blood sugar at least 4 times a day, starting 2 days before surgery, to make sure that the level is not too high or low. o Check your blood sugar the morning of your surgery when you wake up and every 2 hours until you get to the Short Stay unit. . If your blood sugar is less than 70  mg/dL, you will need to treat for low blood sugar: o Do not take insulin. o Treat a low blood sugar (less than 70 mg/dL) with  cup of clear juice (cranberry or apple), 4 glucose tablets, OR glucose gel. o Recheck blood sugar in 15 minutes after treatment (to make sure it is greater than 70 mg/dL). If your blood sugar is not greater than 70 mg/dL on recheck, call [phone] for further instructions. . Report your blood sugar to the short stay nurse when you get to Short Stay.  . If you are admitted to the hospital after surgery: o Your blood sugar will be checked by the staff and you will probably be given insulin after surgery (instead of oral diabetes medicines) to make sure you have good blood sugar levels. o The goal for blood sugar control after surgery is 80-180 mg/dL.   WHAT DO I DO ABOUT MY DIABETES MEDICATION?       May take regular  dose of trulicity  On regular day(friday 02/12/16) unless instructed otherwise by dr  . Drucilla Schmidt not take oral diabetes medicines (pills) the morning of surgery.(metformin,januvia)    Do not wear jewelry, make-up or nail polish.  Do not wear lotions, powders, or perfumes, or deoderant.  Do not shave 48 hours prior to surgery.  Men may shave face and neck.  Do not bring valuables to the hospital.  [facility] is not responsible for any belongings or valuables.  Contacts, dentures or bridgework  may not be worn into surgery.  Leave your suitcase in the car.  After surgery it may be brought to your room.  For patients admitted to the hospital, discharge time will be determined by your treatment team.  Patients discharged the day of surgery will not be allowed to drive home.   Name and phone number of your driver:   Special instructions:   Special Instructions: [facility] - Preparing for Surgery  Before surgery, you can play an important role.  Because skin is not sterile, your skin needs to be as free of germs as possible.  You can reduce the number of  germs on you skin by washing with CHG (chlorahexidine gluconate) soap before surgery.  CHG is an antiseptic cleaner which kills germs and bonds with the skin to continue killing germs even after washing.  Please DO NOT use if you have an allergy to CHG or antibacterial soaps.  If your skin becomes reddened/irritated stop using the CHG and inform your nurse when you arrive at Short Stay.  Do not shave (including legs and underarms) for at least 48 hours prior to the first CHG shower.  You may shave your face.  Please follow these instructions carefully:   1.  Shower with CHG Soap the night before surgery and the morning of Surgery.  2.  If you choose to wash your hair, wash your hair first as usual with your normal shampoo.  3.  After you shampoo, rinse your hair and body thoroughly to remove the Shampoo.  4.  Use CHG as you would any other liquid soap.  You can apply chg directly  to the skin and wash gently with scrungie or a clean washcloth.  5.  Apply the CHG Soap to your body ONLY FROM THE NECK DOWN.  Do not use on open wounds or open sores.  Avoid contact with your eyes ears, mouth and genitals (private parts).  Wash genitals (private parts)       with your normal soap.  6.  Wash thoroughly, paying special attention to the area where your surgery will be performed.  7.  Thoroughly rinse your body with warm water from the neck down.  8.  DO NOT shower/wash with your normal soap after using and rinsing off the CHG Soap.  9.  Pat yourself dry with a clean towel.            10.  Wear clean pajamas.            11.  Place clean sheets on your bed the night of your first shower and do not sleep with pets.  Day of Surgery  Do not apply any lotions/deodorants the morning of surgery.  Please wear clean clothes to the hospital/surgery center.  Please read over the fact sheets that you were given.

## 2016-02-10 NOTE — Progress Notes (Signed)
Mupirocin Ointment called into [facility] in [place] for positive PCR of MRSA and Staph. Pt notified and voiced understanding.

## 2016-02-11 LAB — HEMOGLOBIN A1C
HEMOGLOBIN A1C: 5.8 % — AB (ref 4.8–5.6)
MEAN PLASMA GLUCOSE: 120 mg/dL

## 2016-02-11 NOTE — Progress Notes (Signed)
Anesthesia Chart Review:  Pt is a 65 year old female scheduled for C4-5 ACDF on 02/17/2016 with Venita Lickahari Brooks, MD.   PCP is Samuel Jesterynthia Butler, DO  PMH includes:  DM, asthma, palpitations, post-op N/V, GERD.  Never smoker. BMI 43  Medications include: albuterol, ASA, dulaglutide, metformin, prilosec, phentermine, sitagliptin. Pt instructed to stop phentermine 02/10/16.   Preoperative labs reviewed. HgbA1c 5.8, glucose 101  EKG 02/10/16: NSR.   If no changes, I anticipate pt can proceed with surgery as scheduled.   Rica Mastngela Trayon Krantz, FNP-BC [facility]/Anesthesiology Phone: [phone] 02/11/2016 1:19 PM

## 2016-02-16 NOTE — Anesthesia Preprocedure Evaluation (Addendum)
Anesthesia Evaluation  Patient identified by MRN, date of birth, ID band Patient awake    Reviewed: Allergy & Precautions, NPO status , Patient's Chart, lab work & pertinent test results  History of Anesthesia Complications (+) PONV and history of anesthetic complications  Airway Mallampati: III  TM Distance: >3 FB Neck ROM: Full    Dental  (+) Teeth Intact, Dental Advisory Given   Pulmonary asthma ,    Pulmonary exam normal        Cardiovascular negative cardio ROS Normal cardiovascular exam     Neuro/Psych  Headaches, Anxiety    GI/Hepatic Neg liver ROS, GERD  Medicated,  Endo/Other  diabetes, Well Controlled, Type 2, Oral Hypoglycemic AgentsMorbid obesity  Renal/GU negative Renal ROS     Musculoskeletal  (+) Arthritis ,   Abdominal   Peds  Hematology   Anesthesia Other Findings   Reproductive/Obstetrics                           Anesthesia Physical Anesthesia Plan  ASA: III  Anesthesia Plan: General   Post-op Pain Management:    Induction: Intravenous  Airway Management Planned: Oral ETT  Additional Equipment:   Intra-op Plan:   Post-operative Plan: Extubation in OR  Informed Consent: I have reviewed the patients History and Physical, chart, labs and discussed the procedure including the risks, benefits and alternatives for the proposed anesthesia with the patient or authorized representative who has indicated his/her understanding and acceptance.   Dental advisory given  Plan Discussed with: CRNA and Anesthesiologist  Anesthesia Plan Comments:        Anesthesia Quick Evaluation

## 2016-02-17 ENCOUNTER — Observation Stay (HOSPITAL_COMMUNITY)
Admission: RE | Admit: 2016-02-17 | Discharge: 2016-02-18 | Disposition: A | Discharge status: Home / Self Care | Payer: Medicare HMO | Source: Ambulatory Visit | Attending: Orthopedic Surgery | Admitting: Orthopedic Surgery

## 2016-02-17 ENCOUNTER — Ambulatory Visit (HOSPITAL_COMMUNITY): Payer: Medicare HMO | Admitting: Emergency Medicine

## 2016-02-17 ENCOUNTER — Observation Stay (HOSPITAL_COMMUNITY): Payer: Medicare HMO

## 2016-02-17 ENCOUNTER — Ambulatory Visit (HOSPITAL_COMMUNITY): Payer: Medicare HMO | Admitting: Anesthesiology

## 2016-02-17 ENCOUNTER — Ambulatory Visit (HOSPITAL_COMMUNITY): Payer: Medicare HMO

## 2016-02-17 ENCOUNTER — Encounter (HOSPITAL_COMMUNITY): Payer: Self-pay | Admitting: *Deleted

## 2016-02-17 ENCOUNTER — Encounter (HOSPITAL_COMMUNITY)
Admission: RE | Disposition: A | Discharge status: Home / Self Care | Payer: Self-pay | Source: Ambulatory Visit | Attending: Orthopedic Surgery

## 2016-02-17 DIAGNOSIS — K219 Gastro-esophageal reflux disease without esophagitis: Secondary | ICD-10-CM | POA: Diagnosis not present

## 2016-02-17 DIAGNOSIS — M1612 Unilateral primary osteoarthritis, left hip: Secondary | ICD-10-CM | POA: Insufficient documentation

## 2016-02-17 DIAGNOSIS — M50121 Cervical disc disorder at C4-C5 level with radiculopathy: Secondary | ICD-10-CM | POA: Diagnosis present

## 2016-02-17 DIAGNOSIS — M25552 Pain in left hip: Secondary | ICD-10-CM | POA: Insufficient documentation

## 2016-02-17 DIAGNOSIS — Z90710 Acquired absence of both cervix and uterus: Secondary | ICD-10-CM | POA: Diagnosis not present

## 2016-02-17 DIAGNOSIS — Z9841 Cataract extraction status, right eye: Secondary | ICD-10-CM | POA: Diagnosis not present

## 2016-02-17 DIAGNOSIS — F419 Anxiety disorder, unspecified: Secondary | ICD-10-CM | POA: Diagnosis not present

## 2016-02-17 DIAGNOSIS — M25511 Pain in right shoulder: Secondary | ICD-10-CM | POA: Diagnosis not present

## 2016-02-17 DIAGNOSIS — R262 Difficulty in walking, not elsewhere classified: Secondary | ICD-10-CM

## 2016-02-17 DIAGNOSIS — IMO0001 Reserved for inherently not codable concepts without codable children: Secondary | ICD-10-CM

## 2016-02-17 DIAGNOSIS — Z9842 Cataract extraction status, left eye: Secondary | ICD-10-CM | POA: Insufficient documentation

## 2016-02-17 DIAGNOSIS — Z7984 Long term (current) use of oral hypoglycemic drugs: Secondary | ICD-10-CM | POA: Diagnosis not present

## 2016-02-17 DIAGNOSIS — M25512 Pain in left shoulder: Secondary | ICD-10-CM | POA: Insufficient documentation

## 2016-02-17 DIAGNOSIS — J45909 Unspecified asthma, uncomplicated: Secondary | ICD-10-CM | POA: Insufficient documentation

## 2016-02-17 DIAGNOSIS — E119 Type 2 diabetes mellitus without complications: Secondary | ICD-10-CM | POA: Insufficient documentation

## 2016-02-17 DIAGNOSIS — G8929 Other chronic pain: Secondary | ICD-10-CM | POA: Insufficient documentation

## 2016-02-17 DIAGNOSIS — M542 Cervicalgia: Secondary | ICD-10-CM | POA: Diagnosis present

## 2016-02-17 DIAGNOSIS — Z419 Encounter for procedure for purposes other than remedying health state, unspecified: Secondary | ICD-10-CM

## 2016-02-17 DIAGNOSIS — Z7982 Long term (current) use of aspirin: Secondary | ICD-10-CM | POA: Diagnosis not present

## 2016-02-17 DIAGNOSIS — M545 Low back pain: Secondary | ICD-10-CM | POA: Diagnosis not present

## 2016-02-17 DIAGNOSIS — T849XXA Unspecified complication of internal orthopedic prosthetic device, implant and graft, initial encounter: Secondary | ICD-10-CM

## 2016-02-17 HISTORY — PX: ANTERIOR CERVICAL DECOMP/DISCECTOMY FUSION: SHX1161

## 2016-02-17 LAB — GLUCOSE, CAPILLARY
GLUCOSE-CAPILLARY: 113 mg/dL — AB (ref 65–99)
GLUCOSE-CAPILLARY: 167 mg/dL — AB (ref 65–99)
Glucose-Capillary: 151 mg/dL — ABNORMAL HIGH (ref 65–99)
Glucose-Capillary: 162 mg/dL — ABNORMAL HIGH (ref 65–99)
Glucose-Capillary: 151 mg/dL — ABNORMAL HIGH (ref 65–99)

## 2016-02-17 SURGERY — ANTERIOR CERVICAL DECOMPRESSION/DISCECTOMY FUSION 1 LEVEL
Anesthesia: General | Site: Spine Cervical

## 2016-02-17 MED ORDER — METHOCARBAMOL 1000 MG/10ML IJ SOLN
500.0000 mg | Freq: Four times a day (QID) | INTRAMUSCULAR | Status: DC | PRN
  Filled 2016-02-17: qty 5

## 2016-02-17 MED ORDER — ONDANSETRON HCL 4 MG/2ML IJ SOLN
INTRAMUSCULAR | Status: DC | PRN
  Administered 2016-02-17: 4 mg via INTRAVENOUS

## 2016-02-17 MED ORDER — PROPOFOL 10 MG/ML IV BOLUS
INTRAVENOUS | Status: DC | PRN
  Administered 2016-02-17: 200 mg via INTRAVENOUS

## 2016-02-17 MED ORDER — BUPIVACAINE-EPINEPHRINE 0.25% -1:200000 IJ SOLN
INTRAMUSCULAR | Status: DC | PRN
  Administered 2016-02-17: 8 mL

## 2016-02-17 MED ORDER — FENTANYL CITRATE (PF) 100 MCG/2ML IJ SOLN
INTRAMUSCULAR | Status: AC
  Filled 2016-02-17: qty 2

## 2016-02-17 MED ORDER — THROMBIN 20000 UNITS EX KIT
PACK | CUTANEOUS | Status: DC | PRN
  Administered 2016-02-17: 20 mL via TOPICAL

## 2016-02-17 MED ORDER — METHOCARBAMOL 500 MG PO TABS
ORAL_TABLET | ORAL | Status: AC
  Filled 2016-02-17: qty 1

## 2016-02-17 MED ORDER — OXYCODONE HCL 5 MG PO TABS
ORAL_TABLET | ORAL | Status: AC
  Filled 2016-02-17: qty 2

## 2016-02-17 MED ORDER — ROCURONIUM BROMIDE 10 MG/ML (PF) SYRINGE
PREFILLED_SYRINGE | INTRAVENOUS | Status: AC
  Filled 2016-02-17: qty 10

## 2016-02-17 MED ORDER — PHENYLEPHRINE HCL 10 MG/ML IJ SOLN
INTRAMUSCULAR | Status: DC | PRN
  Administered 2016-02-17: 40 ug via INTRAVENOUS

## 2016-02-17 MED ORDER — MENTHOL 3 MG MT LOZG: 1.0000 | LOZENGE | OROMUCOSAL | Status: DC | PRN

## 2016-02-17 MED ORDER — ONDANSETRON HCL 4 MG PO TABS: 4.0000 mg | ORAL_TABLET | Freq: Three times a day (TID) | ORAL | 0 refills | Status: DC | PRN

## 2016-02-17 MED ORDER — LACTATED RINGERS IV SOLN: INTRAVENOUS | Status: DC

## 2016-02-17 MED ORDER — DEXAMETHASONE SODIUM PHOSPHATE 10 MG/ML IJ SOLN
INTRAMUSCULAR | Status: AC
  Filled 2016-02-17: qty 1

## 2016-02-17 MED ORDER — SODIUM CHLORIDE 0.9% FLUSH
3.0000 mL | Freq: Two times a day (BID) | INTRAVENOUS | Status: DC
  Administered 2016-02-17 (×2): 3 mL via INTRAVENOUS

## 2016-02-17 MED ORDER — FENTANYL CITRATE (PF) 100 MCG/2ML IJ SOLN
INTRAMUSCULAR | Status: DC | PRN
  Administered 2016-02-17: 100 ug via INTRAVENOUS
  Administered 2016-02-17 (×6): 50 ug via INTRAVENOUS

## 2016-02-17 MED ORDER — HYDROMORPHONE HCL 1 MG/ML IJ SOLN
INTRAMUSCULAR | Status: AC
  Filled 2016-02-17: qty 1

## 2016-02-17 MED ORDER — CEFAZOLIN SODIUM-DEXTROSE 2-4 GM/100ML-% IV SOLN
2.0000 g | INTRAVENOUS | Status: AC
  Administered 2016-02-17: 2 g via INTRAVENOUS
  Filled 2016-02-17: qty 100

## 2016-02-17 MED ORDER — OXYCODONE-ACETAMINOPHEN 10-325 MG PO TABS: 1.0000 | ORAL_TABLET | ORAL | 0 refills | Status: DC | PRN

## 2016-02-17 MED ORDER — FENTANYL CITRATE (PF) 100 MCG/2ML IJ SOLN
INTRAMUSCULAR | Status: AC
  Filled 2016-02-17: qty 4

## 2016-02-17 MED ORDER — MIDAZOLAM HCL 5 MG/5ML IJ SOLN
INTRAMUSCULAR | Status: DC | PRN
  Administered 2016-02-17: 2 mg via INTRAVENOUS

## 2016-02-17 MED ORDER — ROCURONIUM BROMIDE 100 MG/10ML IV SOLN
INTRAVENOUS | Status: DC | PRN
  Administered 2016-02-17 (×2): 10 mg via INTRAVENOUS
  Administered 2016-02-17: 30 mg via INTRAVENOUS
  Administered 2016-02-17: 10 mg via INTRAVENOUS
  Administered 2016-02-17: 20 mg via INTRAVENOUS

## 2016-02-17 MED ORDER — PROPOFOL 10 MG/ML IV BOLUS
INTRAVENOUS | Status: AC
  Filled 2016-02-17: qty 20

## 2016-02-17 MED ORDER — PHENOL 1.4 % MT LIQD
1.0000 | OROMUCOSAL | Status: DC | PRN
  Administered 2016-02-17: 1 via OROMUCOSAL
  Filled 2016-02-17: qty 177

## 2016-02-17 MED ORDER — PHENYLEPHRINE 40 MCG/ML (10ML) SYRINGE FOR IV PUSH (FOR BLOOD PRESSURE SUPPORT)
PREFILLED_SYRINGE | INTRAVENOUS | Status: AC
  Filled 2016-02-17: qty 10

## 2016-02-17 MED ORDER — LIDOCAINE HCL (CARDIAC) 20 MG/ML IV SOLN
INTRAVENOUS | Status: DC | PRN
  Administered 2016-02-17: 40 mg via INTRATRACHEAL

## 2016-02-17 MED ORDER — SUGAMMADEX SODIUM 200 MG/2ML IV SOLN
INTRAVENOUS | Status: DC | PRN
  Administered 2016-02-17: 200 mg via INTRAVENOUS

## 2016-02-17 MED ORDER — OXYCODONE HCL 5 MG PO TABS
10.0000 mg | ORAL_TABLET | ORAL | Status: DC | PRN
Start: 2016-02-17 — End: 2016-02-18
  Administered 2016-02-17 – 2016-02-18 (×5): 10 mg via ORAL
  Filled 2016-02-17 (×4): qty 2

## 2016-02-17 MED ORDER — METHOCARBAMOL 500 MG PO TABS: 500.0000 mg | ORAL_TABLET | Freq: Three times a day (TID) | ORAL | 0 refills | Status: DC | PRN

## 2016-02-17 MED ORDER — METFORMIN HCL 500 MG PO TABS
500.0000 mg | ORAL_TABLET | Freq: Two times a day (BID) | ORAL | Status: DC
  Administered 2016-02-17 – 2016-02-18 (×2): 500 mg via ORAL
  Filled 2016-02-17 (×2): qty 1

## 2016-02-17 MED ORDER — DEXTROSE 5 % IV SOLN
INTRAVENOUS | Status: DC | PRN
  Administered 2016-02-17: 20 ug/min via INTRAVENOUS

## 2016-02-17 MED ORDER — ONDANSETRON HCL 4 MG/2ML IJ SOLN
4.0000 mg | INTRAMUSCULAR | Status: DC | PRN
  Administered 2016-02-17: 4 mg via INTRAVENOUS
  Filled 2016-02-17: qty 2

## 2016-02-17 MED ORDER — THROMBIN 20000 UNITS EX SOLR
CUTANEOUS | Status: AC
  Filled 2016-02-17: qty 20000

## 2016-02-17 MED ORDER — SCOPOLAMINE 1 MG/3DAYS TD PT72
MEDICATED_PATCH | TRANSDERMAL | Status: AC
  Administered 2016-02-17: 1.5 mg via TRANSDERMAL
  Filled 2016-02-17: qty 1

## 2016-02-17 MED ORDER — DEXAMETHASONE SODIUM PHOSPHATE 10 MG/ML IJ SOLN
INTRAMUSCULAR | Status: DC | PRN
  Administered 2016-02-17: 8 mg via INTRAVENOUS

## 2016-02-17 MED ORDER — LACTATED RINGERS IV SOLN
INTRAVENOUS | Status: DC | PRN
  Administered 2016-02-17 (×2): via INTRAVENOUS

## 2016-02-17 MED ORDER — BUPIVACAINE-EPINEPHRINE (PF) 0.25% -1:200000 IJ SOLN
INTRAMUSCULAR | Status: AC
  Filled 2016-02-17: qty 30

## 2016-02-17 MED ORDER — CEFAZOLIN IN D5W 1 GM/50ML IV SOLN
1.0000 g | Freq: Three times a day (TID) | INTRAVENOUS | Status: AC
  Administered 2016-02-17 (×2): 1 g via INTRAVENOUS
  Filled 2016-02-17 (×2): qty 50

## 2016-02-17 MED ORDER — ONDANSETRON HCL 4 MG/2ML IJ SOLN
INTRAMUSCULAR | Status: AC
  Filled 2016-02-17: qty 2

## 2016-02-17 MED ORDER — ALBUTEROL SULFATE (2.5 MG/3ML) 0.083% IN NEBU: 3.0000 mL | INHALATION_SOLUTION | Freq: Four times a day (QID) | RESPIRATORY_TRACT | Status: DC | PRN

## 2016-02-17 MED ORDER — LINAGLIPTIN 5 MG PO TABS
5.0000 mg | ORAL_TABLET | Freq: Every day | ORAL | Status: DC
  Administered 2016-02-17: 5 mg via ORAL
  Filled 2016-02-17 (×2): qty 1

## 2016-02-17 MED ORDER — PHENTERMINE HCL 37.5 MG PO TABS: 18.7500 mg | ORAL_TABLET | Freq: Every day | ORAL | Status: DC

## 2016-02-17 MED ORDER — SUCCINYLCHOLINE CHLORIDE 200 MG/10ML IV SOSY
PREFILLED_SYRINGE | INTRAVENOUS | Status: DC | PRN
  Administered 2016-02-17: 120 mg via INTRAVENOUS

## 2016-02-17 MED ORDER — HYDROMORPHONE HCL 1 MG/ML IJ SOLN
0.2500 mg | INTRAMUSCULAR | Status: DC | PRN
  Administered 2016-02-17 (×4): 0.5 mg via INTRAVENOUS

## 2016-02-17 MED ORDER — MORPHINE SULFATE (PF) 2 MG/ML IV SOLN: 1.0000 mg | INTRAVENOUS | Status: DC | PRN

## 2016-02-17 MED ORDER — LIDOCAINE 2% (20 MG/ML) 5 ML SYRINGE
INTRAMUSCULAR | Status: AC
  Filled 2016-02-17: qty 5

## 2016-02-17 MED ORDER — INSULIN ASPART 100 UNIT/ML ~~LOC~~ SOLN
0.0000 [IU] | SUBCUTANEOUS | Status: DC
  Administered 2016-02-17: 3 [IU] via SUBCUTANEOUS

## 2016-02-17 MED ORDER — SODIUM CHLORIDE 0.9% FLUSH: 3.0000 mL | INTRAVENOUS | Status: DC | PRN

## 2016-02-17 MED ORDER — INSULIN ASPART 100 UNIT/ML ~~LOC~~ SOLN
0.0000 [IU] | Freq: Three times a day (TID) | SUBCUTANEOUS | Status: DC
  Administered 2016-02-18: 2 [IU] via SUBCUTANEOUS

## 2016-02-17 MED ORDER — 0.9 % SODIUM CHLORIDE (POUR BTL) OPTIME
TOPICAL | Status: DC | PRN
  Administered 2016-02-17: 1000 mL

## 2016-02-17 MED ORDER — SUCCINYLCHOLINE CHLORIDE 200 MG/10ML IV SOSY
PREFILLED_SYRINGE | INTRAVENOUS | Status: AC
  Filled 2016-02-17: qty 10

## 2016-02-17 MED ORDER — MIDAZOLAM HCL 2 MG/2ML IJ SOLN
INTRAMUSCULAR | Status: AC
  Filled 2016-02-17: qty 2

## 2016-02-17 MED ORDER — CITALOPRAM HYDROBROMIDE 20 MG PO TABS
20.0000 mg | ORAL_TABLET | Freq: Every day | ORAL | Status: DC
  Administered 2016-02-17: 20 mg via ORAL
  Filled 2016-02-17 (×2): qty 1

## 2016-02-17 MED ORDER — SCOPOLAMINE 1 MG/3DAYS TD PT72
1.0000 | MEDICATED_PATCH | TRANSDERMAL | Status: DC
  Administered 2016-02-17: 1.5 mg via TRANSDERMAL

## 2016-02-17 MED ORDER — PROMETHAZINE HCL 25 MG/ML IJ SOLN: 6.2500 mg | INTRAMUSCULAR | Status: DC | PRN

## 2016-02-17 MED ORDER — SUGAMMADEX SODIUM 200 MG/2ML IV SOLN
INTRAVENOUS | Status: AC
  Filled 2016-02-17: qty 2

## 2016-02-17 MED ORDER — METHOCARBAMOL 500 MG PO TABS
500.0000 mg | ORAL_TABLET | Freq: Four times a day (QID) | ORAL | Status: DC | PRN
  Administered 2016-02-17 – 2016-02-18 (×3): 500 mg via ORAL
  Filled 2016-02-17 (×2): qty 1

## 2016-02-17 SURGICAL SUPPLY — 63 items
BLADE SURG ROTATE 9660 (MISCELLANEOUS) IMPLANT
CANISTER SUCTION 2500CC (MISCELLANEOUS) ×3 IMPLANT
CLOSURE STERI-STRIP 1/2X4 (GAUZE/BANDAGES/DRESSINGS) ×1
CLSR STERI-STRIP ANTIMIC 1/2X4 (GAUZE/BANDAGES/DRESSINGS) ×2 IMPLANT
CORDS BIPOLAR (ELECTRODE) ×3 IMPLANT
COVER SURGICAL LIGHT HANDLE (MISCELLANEOUS) ×6 IMPLANT
CRADLE DONUT ADULT HEAD (MISCELLANEOUS) ×3 IMPLANT
DEVICE ENDSKLTN IMPLANT SM 7MM (Cage) ×1 IMPLANT
DRAPE C-ARM 42X72 X-RAY (DRAPES) ×3 IMPLANT
DRAPE INCISE IOBAN 66X45 STRL (DRAPES) ×3 IMPLANT
DRAPE POUCH INSTRU U-SHP 10X18 (DRAPES) ×3 IMPLANT
DRAPE SURG 17X23 STRL (DRAPES) ×3 IMPLANT
DRAPE U-SHAPE 47X51 STRL (DRAPES) ×3 IMPLANT
DRSG AQUACEL AG ADV 3.5X 4 (GAUZE/BANDAGES/DRESSINGS) IMPLANT
DRSG MEPILEX BORDER 4X4 (GAUZE/BANDAGES/DRESSINGS) ×3 IMPLANT
DURAPREP 6ML APPLICATOR 50/CS (WOUND CARE) ×3 IMPLANT
ELECT COATED BLADE 2.86 ST (ELECTRODE) ×3 IMPLANT
ELECT PENCIL ROCKER SW 15FT (MISCELLANEOUS) ×3 IMPLANT
ELECT REM PT RETURN 9FT ADLT (ELECTROSURGICAL) ×3
ELECTRODE REM PT RTRN 9FT ADLT (ELECTROSURGICAL) ×1 IMPLANT
ENDOSKELETON IMPLANT SM 7MM (Cage) ×3 IMPLANT
GLOVE BIO SURGEON STRL SZ 6.5 (GLOVE) ×2 IMPLANT
GLOVE BIO SURGEONS STRL SZ 6.5 (GLOVE) ×1
GLOVE BIOGEL PI IND STRL 6.5 (GLOVE) ×1 IMPLANT
GLOVE BIOGEL PI IND STRL 8.5 (GLOVE) ×1 IMPLANT
GLOVE BIOGEL PI INDICATOR 6.5 (GLOVE) ×2
GLOVE BIOGEL PI INDICATOR 8.5 (GLOVE) ×2
GLOVE SS BIOGEL STRL SZ 8.5 (GLOVE) ×1 IMPLANT
GLOVE SUPERSENSE BIOGEL SZ 8.5 (GLOVE) ×2
GOWN STRL REUS W/ TWL LRG LVL3 (GOWN DISPOSABLE) ×1 IMPLANT
GOWN STRL REUS W/TWL 2XL LVL3 (GOWN DISPOSABLE) ×3 IMPLANT
GOWN STRL REUS W/TWL LRG LVL3 (GOWN DISPOSABLE) ×2
KIT BASIN OR (CUSTOM PROCEDURE TRAY) ×3 IMPLANT
KIT ROOM TURNOVER OR (KITS) ×3 IMPLANT
NEEDLE 22X1 1/2 (OR ONLY) (NEEDLE) ×3 IMPLANT
NEEDLE SPNL 18GX3.5 QUINCKE PK (NEEDLE) ×3 IMPLANT
NS IRRIG 1000ML POUR BTL (IV SOLUTION) ×3 IMPLANT
PACK ORTHO CERVICAL (CUSTOM PROCEDURE TRAY) ×3 IMPLANT
PACK UNIVERSAL I (CUSTOM PROCEDURE TRAY) ×3 IMPLANT
PAD ARMBOARD 7.5X6 YLW CONV (MISCELLANEOUS) ×6 IMPLANT
PATTIES SURGICAL .25X.25 (GAUZE/BANDAGES/DRESSINGS) IMPLANT
PIN DISTRACTION 14 (PIN) ×6 IMPLANT
PLATE SKYLINE 12MM (Plate) ×3 IMPLANT
PUTTY BONE DBX 2.5 MIS (Bone Implant) ×3 IMPLANT
RESTRAINT LIMB HOLDER UNIV (RESTRAINTS) ×3 IMPLANT
SCREW SELF DRILL SKYLINE 12MM (Screw) ×6 IMPLANT
SCREW SKYLINE 14MM SD-VA (Screw) ×6 IMPLANT
SPONGE INTESTINAL PEANUT (DISPOSABLE) ×3 IMPLANT
SPONGE SURGIFOAM ABS GEL 100 (HEMOSTASIS) ×3 IMPLANT
SURGIFLO W/THROMBIN 8M KIT (HEMOSTASIS) ×3 IMPLANT
SUT BONE WAX W31G (SUTURE) ×3 IMPLANT
SUT MON AB 3-0 SH 27 (SUTURE) ×2
SUT MON AB 3-0 SH27 (SUTURE) ×1 IMPLANT
SUT SILK 2 0 (SUTURE) ×2
SUT SILK 2-0 18XBRD TIE 12 (SUTURE) ×1 IMPLANT
SUT VIC AB 2-0 CT1 18 (SUTURE) ×3 IMPLANT
SYR BULB IRRIGATION 50ML (SYRINGE) ×3 IMPLANT
SYR CONTROL 10ML LL (SYRINGE) ×3 IMPLANT
TAPE CLOTH 4X10 WHT NS (GAUZE/BANDAGES/DRESSINGS) ×3 IMPLANT
TAPE UMBILICAL COTTON 1/8X30 (MISCELLANEOUS) ×3 IMPLANT
TOWEL OR 17X24 6PK STRL BLUE (TOWEL DISPOSABLE) ×3 IMPLANT
TOWEL OR 17X26 10 PK STRL BLUE (TOWEL DISPOSABLE) ×3 IMPLANT
WATER STERILE IRR 1000ML POUR (IV SOLUTION) IMPLANT

## 2016-02-17 NOTE — H&P (Signed)
History of Present Illness  The patient is a 65 year old female who comes in today for a preoperative History and Physical. The patient is scheduled for a ACDF C4-5 to be performed by Dr. Debria Garretahari D. Shon BatonBrooks, MD [facility] on 02-17-16 . Please see the hospital record for complete dictated history and physical. Pt reports overall being in good health. She does reports left hip pain. She recalls benefit to an I/A hip injection but says it is wearing off. Told her we could discuss getting another injection at the 6 week post op appt. We can also consider a referral to one of our hip specialist postop.  Additional reasons for visit:  Transition into care is described as the following: The patient is transitioning into care and a summary of care was reviewed.   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 Congestive Heart Failure  Mother. First Degree Relatives  reported Heart Disease  Paternal Grandfather. Heart disease in female family member before age 64  Heart disease in female family member before age 64  Severe allergy  Sister.  Social History  Exercise  Exercises rarely; does other No history of drug/alcohol rehab  Number of flights of stairs before winded  greater than 5 Tobacco use  Never smoker. No alcohol use   Medication History  ProAir HFA (108 (90 Base)MCG/ACT Aerosol Soln, Inhalation) Active. Omeprazole (20MG  Capsule DR, Oral) Active. MetFORMIN HCl (500MG  Tablet, Oral) Active. Aspirin (81MG  Tablet DR, Oral) Active. Phentermine HCl (37.5MG  Tablet, Oral)  Active. Citalopram Hydrobromide (20MG  Tablet, Oral) Active. Trulicity (1.5MG /0.5ML Soln Pen-inj, Subcutaneous) Active. Tylenol (325MG  Tablet, Oral) Active. Medications Reconciled  Past Surgical History  Arthroscopy of Knee  bilateral Cataract Surgery  bilateral Hysterectomy  complete (non-cancerous)  Vitals 02/09/2016 1:14 PM Weight: 238 lb Height: 63in Body Surface Area: 2.08 m Body Mass Index: 42.16 kg/m  Temp.: 98.60F(Oral)  Pulse: 84 (Regular)  BP: 135/83 (Sitting, Left Arm, Standard)  Physical Exam General General Appearance-Not in acute distress. Orientation-Oriented X3. Build & Nutrition-Well nourished and Well developed.  Integumentary General Characteristics Surgical Scars - no surgical scar evidence of previous cervical surgery. Cervical Spine-Skin examination of the cervical spine is without deformity, skin lesions, lacerations or abrasions.  Chest and Lung Exam Auscultation Breath sounds - Normal and Clear.  Cardiovascular Auscultation Rhythm - Regular rate and rhythm.  Peripheral Vascular Upper Extremity Palpation - Radial pulse - Bilateral - 2+.  Neurologic Sensation Upper Extremity - Bilateral - sensation is intact in the upper extremity. Reflexes Biceps Reflex - Bilateral - 2+. Brachioradialis Reflex - Bilateral - 2+. Triceps Reflex - Bilateral - 2+. Hoffman's Sign - Bilateral - Hoffman's sign not present.  Musculoskeletal Spine/Ribs/Pelvis  Cervical Spine : Inspection and Palpation - Tenderness - right cervical paraspinals tender to palpation and left cervical paraspinals tender to palpation. Strength and Tone: Strength: Strength: Strength - Triceps - Bilateral - 5/5. Right - 5/5. Deltoid - Left - 4/5. Biceps - Left - 4/5. Right - 5/5. Wrist Extension - Bilateral - 5/5. Hand Grip - Bilateral - 5/5. Heel walk - Bilateral - able to heel walk without difficulty. Toe Walk - Bilateral - able to  walk on toes without  difficulty. Heel-Toe Walk - Bilateral - able to heel-toe walk without difficulty. ROM - Flexion - Moderately Decreased and painful. Extension - Moderately Decreased and painful. Left Lateral Flexion - Moderately Decreased and painful. Right Lateral Flexion - Moderately Decreased and painful. Left Rotation - Moderately Decreased and painful. Right Rotation - Moderately Decreased and painful. Pain - . Cervical Spine - Special Testing - axial compression test negative, cross chest impingement test negative. Non-Anatomic Signs - No non-anatomic signs present. Upper Extremity Range of Motion - No truesholder pain with IR/ER of the shoulders.   The MRI today compared to her January MRI shows the ongoing disc herniation at 4-5 with posterior lateral to the left disc extrusion. It is causing left C5 nerve compression and most likely C6 irritation as it traverses. There is mild-to-moderate foraminal stenosis with no significant central stenosis at C5/6.  Assessment & Plan  Anterior cervical fusion:Risks of surgery include, but are not limited to: Throat pain, swallowing difficulty, hoarseness or change in voice, death, stroke, paralysis, nerve root damage/injury, bleeding, blood clots, loss of bowel/bladder control, hardware failure, or mal-position, spinal fluid leak, adjacent segment disease, non-union, need for further surgery, ongoing or worse pain, infection. Post-operative bleeding or swelling that could require emergent surgery.  Goal Of Surgery: Discussed that goal of surgery is to reduce pain and improve function and quality of life. Patient is aware that despite all appropriate treatment that there pain and function could be the same, worse, or different.  On clinical exam, she continues to have trace weakness of the deltoid and biceps on the left side. She has pain that radiates into the C5 distribution down to the deltoid tuberosity in the mid humerus region on the left side. She has pain with  cervical spine range of motion. At this point in time, we had a long talk about a one versus two level ACDF. I know in the past I had considered the 5-6, but at this point clinically she has more C5 nerve pathology. Her MRI shows more pathology at the 4-5 level than at the 5-6. At this point, I think doing just a one level will give [place] a positive outcome. We have reviewed the risks and benefits, all of her questions were addressed. We will plan on moving forward with surgery once we get preoperative medical clearance.

## 2016-02-17 NOTE — Progress Notes (Signed)
PHARMACIST - PHYSICIAN ORDER COMMUNICATION  CONCERNING: Phentermine  This medication is used for weight loss and is unavailable inpatient. The order has been discontinued. Consider resuming upon discharge if appropriate.   Loura BackJennifer Entiat, PharmD, BCPS Clinical Pharmacist Main pharmacy - [phone] 02/17/2016 1:40 PM

## 2016-02-17 NOTE — Anesthesia Procedure Notes (Signed)
Performed by: Orlinda Slomski P       

## 2016-02-17 NOTE — Discharge Instructions (Signed)

## 2016-02-17 NOTE — Anesthesia Postprocedure Evaluation (Signed)
Anesthesia Post Note  Patient: Regina Munoz  Procedure(s) Performed: Procedure(s) (LRB): ANTERIOR CERVICAL DECOMPRESSION/DISCECTOMY FUSION 1 LEVEL C4-5 (N/A)  Patient location during evaluation: PACU Anesthesia Type: General Level of consciousness: sedated Pain management: pain level controlled Vital Signs Assessment: post-procedure vital signs reviewed and stable Respiratory status: spontaneous breathing and respiratory function stable Cardiovascular status: stable Anesthetic complications: no    Last Vitals:  Vitals:   02/17/16 1230 02/17/16 1245  BP: (!) 137/55 131/83  Pulse:    Resp: 20   Temp:      Last Pain:  Vitals:   02/17/16 1245  TempSrc:   PainSc: 3                  Raffaele Derise DANIEL

## 2016-02-17 NOTE — Progress Notes (Signed)
Pt. Had hemoglobin A1C drawn on 02/10/2016. The order for today was discontinued per protocol.

## 2016-02-17 NOTE — Brief Op Note (Signed)
02/17/2016  11:10 AM  PATIENT:  Regina Munoz  65 y.o. female  PRE-OPERATIVE DIAGNOSIS:  C4-5 H & P left C5 radiculopathy  POST-OPERATIVE DIAGNOSIS:  C4-5 H & P left C5 radiculopathy  PROCEDURE:  Procedure(s): ANTERIOR CERVICAL DECOMPRESSION/DISCECTOMY FUSION 1 LEVEL C4-5 (N/A)  SURGEON:  Surgeon(s) and Role:    * Venita Lickahari Cordney Barstow, MD - Primary  PHYSICIAN ASSISTANT:   ASSISTANTS: Carmen Mayo   ANESTHESIA:   general  EBL:  Total I/O In: 1000 [I.V.:1000] Out: -   BLOOD ADMINISTERED:none  DRAINS: none   LOCAL MEDICATIONS USED:  MARCAINE     SPECIMEN:  No Specimen  DISPOSITION OF SPECIMEN:  N/A  COUNTS:  YES  TOURNIQUET:  * No tourniquets in log *  DICTATION: .Other Dictation: Dictation Number [id number]  PLAN OF CARE: Admit for overnight observation  PATIENT DISPOSITION:  PACU - hemodynamically stable.

## 2016-02-17 NOTE — Transfer of Care (Signed)
Immediate Anesthesia Transfer of Care Note  Patient: Regina Munoz  Procedure(s) Performed: Procedure(s): ANTERIOR CERVICAL DECOMPRESSION/DISCECTOMY FUSION 1 LEVEL C4-5 (N/A)  Patient Location: PACU  Anesthesia Type:General  Level of Consciousness: awake, alert , oriented and patient cooperative  Airway & Oxygen Therapy: Patient Spontanous Breathing and Patient connected to face mask  Post-op Assessment: Report given to RN, Post -op Vital signs reviewed and stable, Patient moving all extremities X 4 and Patient able to stick tongue midline  Post vital signs: Reviewed and stable  Last Vitals:  Vitals:   02/17/16 0746  BP: (!) 128/48  Pulse: 87  Resp: 20  Temp: 37.2 C    Last Pain:  Vitals:   02/17/16 0746  TempSrc: Oral      Patients Stated Pain Goal: 6 (02/17/16 0715)  Complications: No apparent anesthesia complications

## 2016-02-18 ENCOUNTER — Encounter (HOSPITAL_COMMUNITY): Payer: Self-pay | Admitting: Orthopedic Surgery

## 2016-02-18 DIAGNOSIS — M50121 Cervical disc disorder at C4-C5 level with radiculopathy: Secondary | ICD-10-CM | POA: Diagnosis not present

## 2016-02-18 LAB — GLUCOSE, CAPILLARY: Glucose-Capillary: 123 mg/dL — ABNORMAL HIGH (ref 65–99)

## 2016-02-18 MED ORDER — CHLORHEXIDINE GLUCONATE CLOTH 2 % EX PADS
6.0000 | MEDICATED_PAD | Freq: Every day | CUTANEOUS | Status: DC
  Administered 2016-02-18: 6 via TOPICAL

## 2016-02-18 NOTE — Progress Notes (Signed)
    Subjective: Procedure(s) (LRB): ANTERIOR CERVICAL DECOMPRESSION/DISCECTOMY FUSION 1 LEVEL C4-5 (N/A) 1 Day Post-Op  Patient reports pain as 2 on 0-10 scale.  Reports decreased arm pain reports incisional neck pain   Positive void Negative bowel movement Positive flatus Negative chest pain or shortness of breath  Objective: Vital signs in last 24 hours: Temp:  [97.6 F (36.4 C)-98.5 F (36.9 C)] 97.8 F (36.6 C) (10/05 0753) Pulse Rate:  [76-99] 79 (10/05 0753) Resp:  [16-20] 18 (10/05 0753) BP: (104-138)/(49-83) 104/51 (10/05 0753) SpO2:  [93 %-99 %] 94 % (10/05 0753)  Intake/Output from previous day: 10/04 0701 - 10/05 0700 In: 1780 [P.O.:480; I.V.:1300] Out: 200 [Emesis/NG output:200]  Labs: No results for input(s): WBC, RBC, HCT, PLT in the last 72 hours. No results for input(s): NA, K, CL, CO2, BUN, CREATININE, GLUCOSE, CALCIUM in the last 72 hours. No results for input(s): LABPT, INR in the last 72 hours.  Physical Exam: Neurologically intact ABD soft Intact pulses distally Incision: dressing C/D/I and no drainage Compartment soft  Assessment/Plan: Patient stable  xrays satisfactory Mobilization with physical therapy Encourage incentive spirometry Continue care  Advance diet Up with therapy Discharge home with home health if needed  F/u in 2 weeks  Regina Lickahari Brystol Wasilewski, MD [facility] [phone]

## 2016-02-18 NOTE — Evaluation (Signed)
Occupational Therapy Evaluation and Discharge Patient Details Name: Regina Munoz MRN: [id number] DOB: January 14, 1951 Today's Date: 02/18/2016    History of Present Illness Pt is a 65 y/o female post-op Anterior cervical decompression/Disectomy fusion level C4-5 (N/A). Pt has a past medical history of Anxiety; Arthritis; Asthma; Complication of anesthesia; Diabetes mellitus without complication (HCC); Dysrhythmia; GERD (gastroesophageal reflux disease); Headache; and PONV (postoperative nausea and vomiting).   Clinical Impression   PTA Pt independent with ADL and IADL. Pt received education on precautions and AE to increase independence in self-care tasks. Daughter also in room and present for session. Pt able to perform grooming at sink and toileting as well as simulated shower transfer. Pt safe to d/c home with supervision and no OT follow up.     Follow Up Recommendations  No OT follow up;Supervision - Intermittent    Equipment Recommendations       Recommendations for Other Services       Precautions / Restrictions Precautions Precautions: Cervical Precaution Comments: Reviewed  Required Braces or Orthoses: Cervical Brace Cervical Brace: Hard collar;At all times Restrictions Weight Bearing Restrictions: No      Mobility Bed Mobility Overal bed mobility: Modified Independent                Transfers Overall transfer level: Modified independent                    Balance Overall balance assessment: No apparent balance deficits (not formally assessed)                                          ADL Overall ADL's : Needs assistance/impaired Eating/Feeding: Modified independent   Grooming: Wash/dry hands;Wash/dry face;Brushing hair;Supervision/safety;Cueing for safety;Standing Grooming Details (indicate cue type and reason): Pt educated in arching precaution, keeping activities at shoulder or below Upper Body Bathing: Minimal  assitance;With caregiver independent assisting;With adaptive equipment;Sitting   Lower Body Bathing: With caregiver independent assisting;Minimal assistance;Cueing for compensatory techniques;Cueing for back precautions;Sit to/from stand Lower Body Bathing Details (indicate cue type and reason): Pt re-educated in use of long handle sponge for LB bathing Upper Body Dressing : Supervision/safety;Bed level;Sitting Upper Body Dressing Details (indicate cue type and reason): Pt able to don cervical brace under supervision laying down and rolling.  Lower Body Dressing: Supervision/safety;Sit to/from stand (Pt able to bendi knees and bringing feet up for socks) Lower Body Dressing Details (indicate cue type and reason): re-educated in use of grabber/reacher for LB dressing. Pt states that she has sock donner as well at home Toilet Transfer: Supervision/safety;Ambulation;BSC Toilet Transfer Details (indicate cue type and reason): BSC over toilet Toileting- Clothing Manipulation and Hygiene: Supervision/safety;Cueing for back precautions;Sit to/from stand (educated on use of toileting tongs)   Tub/ Shower Transfer: Walk-in shower;Supervision/safety   Functional mobility during ADLs: Supervision/safety General ADL Comments: Pt familiar with DME and AE from previous surgeries     Vision     Perception     Praxis      Pertinent Vitals/Pain Pain Assessment: 0-10 Pain Score: 4  Pain Location: neck Pain Descriptors / Indicators: Sore Pain Intervention(s): Monitored during session     Hand Dominance Right   Extremity/Trunk Assessment Upper Extremity Assessment Upper Extremity Assessment: Overall WFL for tasks assessed   Lower Extremity Assessment Lower Extremity Assessment: Overall WFL for tasks assessed   Cervical / Trunk Assessment Cervical / Trunk Assessment:  Other exceptions Cervical / Trunk Exceptions: cervical brace   Communication Communication Communication: No difficulties    Cognition Arousal/Alertness: Awake/alert Behavior During Therapy: WFL for tasks assessed/performed Overall Cognitive Status: Within Functional Limits for tasks assessed                     General Comments       Exercises       Shoulder Instructions      Home Living Family/patient expects to be discharged to:: Private residence Living Arrangements: Spouse/significant other Available Help at Discharge: Family;Available 24 hours/day Type of Home: House Home Access: Stairs to enter [employer] of Steps: 2 Entrance Stairs-Rails: Right;Left;Can reach both Home Layout: Able to live on main level with bedroom/bathroom     Bathroom Shower/Tub: Producer, television/film/video: Standard Bathroom Accessibility: Yes How Accessible: Accessible via walker Home Equipment: Walker - 2 wheels;Cane - single point;Bedside commode;Shower seat;Adaptive equipment Adaptive Equipment: Reacher;Long-handled sponge Additional Comments: Pt with Bil Knee replacement and Hip replacement, all DME and AE      Prior Functioning/Environment Level of Independence: Independent                 OT Problem List: Decreased knowledge of precautions;Decreased knowledge of use of DME or AE   OT Treatment/Interventions:      OT Goals(Current goals can be found in the care plan section) Acute Rehab OT Goals Patient Stated Goal: home today OT Goal Formulation: All assessment and education complete, DC therapy Time For Goal Achievement: 02/25/16 Potential to Achieve Goals: Good  OT Frequency:     Barriers to D/C:            Co-evaluation              End of Session Equipment Utilized During Treatment: Cervical collar Nurse Communication: Mobility status;Precautions  Activity Tolerance: Patient tolerated treatment well Patient left: in bed;with call bell/phone within reach;with family/visitor present   Time: 0835-0905 OT Time Calculation (min): 30 min Charges:  OT  General Charges $OT Visit: 1 Procedure OT Evaluation $OT Eval Low Complexity: 1 Procedure OT Treatments $Self Care/Home Management : 8-22 mins G-Codes:    Evern Bio Aragon Scarantino 21-Feb-2016, 11:31 AM  Sherryl Manges OTR/L [phone]

## 2016-02-18 NOTE — Progress Notes (Signed)
Pt and daughter given D/C instructions with Rx's, verbal understanding was provided. Pt's incision is clean and dry with no sign of infection. Pt's IV was removed prior to D/C. Pt has Aspen collar on @ D/C. Pt D/C'd home via wheelchair @ 1110 per MD order. Pt is stable @ D/C and has no other needs at this time. Rema FendtAshley Joel [place], RN

## 2016-02-18 NOTE — Op Note (Signed)
NAMNicoletta Munoz:  Regina, Munoz             ACCOUNT NO.:  [id number]  MEDICAL RECORD NO.:  [id number]  LOCATION:  3C08C                        FACILITY:  [facility]  PHYSICIAN:  Kayte Borchard D. Shon BatonBrooks, M.D. 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  OPERATIVE PROCEDURE:  Anterior cervical diskectomy and fusion.  IMPLANT SYSTEM USED:  Titan titanium intervertebral cage, size 7, small packed with DBX mix along with a DePuy Skyline anterior cervical plate size 12 with 14-mm screws placed into the body of C4 and 12-mm screws into the body of C5.  COMPLICATIONS:  None.  CONDITION:  Stable.  FIRST ASSISTANT:  Anette Riedelarmen Mayo, [place].  HISTORY:  This is a very pleasant 65 year old woman who presents with severe radicular arm pain on the left side and weakness of the C5 dermatome.  MRI demonstrated a C4-5 disk herniation with caudal migration.  Attempts at conservative management have failed to alleviate her symptoms and so we elected to proceed with surgery.  All appropriate risks, benefits, and alternatives were discussed with the patient and consent was obtained.  OPERATIVE NOTE:  The patient was brought to the operating room and placed supine on the operating table.  After successful induction of general anesthesia and endotracheal intubation, TEDs and SCDs were applied.  The arms were tucked at the sides.  The anterior cervical spine was prepped and draped in a standard fashion.  Time-out was taken confirming the patient, procedure and all other pertinent important data.  Once this was done, x-ray was used to identify the C4-5 disk space and the incision site was marked out and then infiltrated with 0.25% Marcaine with epi.  A midline transverse  incision was started at the midline and proceeding to the left and sharp dissection was carried out down to the platysma.  The platysma was sharply incised, and I continued a standard Smith-Robinson approach through the platysma and through the deep cervical fascia.  I swept the esophagus and trachea to the right and protected it with an appendiceal retractor and then identified the carotid sheath.  Using [employer] dissectors, I removed the remaining prevertebral fascia to expose the anterior longitudinal ligament.  A needle was placed into the 4-5 disk space and a lateral C- arm x-ray was used to confirm I was at the appropriate level.  Once this was done, I then used bipolar electrocautery and mobilized the paraspinal muscles from the midbody of C4 to the midbody of C5.  Self- retaining retractor blades were placed underneath the longus colli muscle.  The endotracheal cuff was deflated.  I expanded the retractor and then reinflated the cuff.  An annulotomy was performed with a 15- blade scalpel and then using pituitary rongeurs, curettes, and Kerrison rongeurs, I removed the bulk of the disk material.  I then used a 2 and 3-mm Kerrison punch to remove the anterior osteophyte from the inferior aspect of the C4 vertebral body for better exposure.  Distraction pins were then placed into the 4 and  5 disk space.  I distracted the intervertebral space and maintained the distraction with the distraction pin set.  I then used my neural curette to continue my diskectomy down to the posterior anulus.  Once I was down there, I was able to see a rent in the midportion of the disk and I removed the fragment of disk material.  I then used my 1-mm Kerrison punch to trim down the posterior osteophyte from the C4 and C5 vertebral bodies.  I then went underneath the uncovertebral joint and debrided this with my 1-mm Kerrison as well. Using a fine neural hook, I was able to sweep underneath the body of  C4- C5 and sweep out 3 large free fragments of disk material.  This allowed me to develop a plane underneath the posterior longitudinal ligament and resect it with a 1-mm Kerrison.  At this point, I was able to freely pass my nerve hook underneath the posterior body of C5 where the disk fragment was located.  I was also able to sweep superiorly and easily out and underneath the uncovertebral joints.  I was pleased with the decompression and diskectomy.  I rasped the endplates and then trialed the 8 and 7 Titan interbody spacer and elected to place the 7.  The implant was obtained, packed with DBX max and malleted to the appropriate depth.  Once that was accomplished, I then removed the distraction pins and placed bone wax over the distraction pin sites.  I then obtained a 12-mm anterior cervical plate and affixed it using self- drilling screws into the bodies of C4 and C5.  All 4 screws had excellent purchase and were then locked according to manufacturer standards.  I then irrigated the wound copiously with normal saline.  I then used bipolar electrocautery and FloSeal to obtain and maintain hemostasis.  Once this was completed, I returned the trachea and esophagus to midline.  I then closed the platysma with interrupted 2-0 Vicryl suture and then a 3-0 Monocryl for the skin.  Steri-Strips and a dry dressing were applied and the patient was ultimately extubated, transferred to the PACU without incident.  At the end of the case, all needle and sponge counts were correct.     Vannah Nadal D. Shon Baton, M.D.     DDB/MEDQ  D:  02/17/2016  T:  02/18/2016  Job:  [id number]  cc:   Dr. Shon Baton' office

## 2016-02-18 NOTE — Evaluation (Signed)
Physical Therapy Evaluation & discharge Patient Details Name: Regina Munoz MRN: [id number] DOB: 1950-08-28 Today's Date: 02/18/2016   History of Present Illness  Pt is a 65 y/o female post-op Anterior cervical decompression/Disectomy fusion level C4-5 (N/A). Pt has a past medical history of Anxiety; Arthritis; Asthma; Complication of anesthesia; Diabetes mellitus without complication (HCC); Dysrhythmia; GERD (gastroesophageal reflux disease); Headache; and PONV (postoperative nausea and vomiting).  Clinical Impression  Pt did well with gait and stairs.  Pt educated on importance of home safety due to decreased cervical ROM.  No further PT needs identified and will d/c.    Follow Up Recommendations No PT follow up    Equipment Recommendations  None recommended by PT    Recommendations for Other Services       Precautions / Restrictions Precautions Precautions: Cervical Precaution Comments: Reviewed  Required Braces or Orthoses: Cervical Brace Cervical Brace: Hard collar;At all times Restrictions Weight Bearing Restrictions: No      Mobility  Bed Mobility Overal bed mobility: Modified Independent                Transfers Overall transfer level: Modified independent                  Ambulation/Gait Ambulation/Gait assistance: Modified independent (Device/Increase time);Supervision Ambulation Distance (Feet): 400 Feet Assistive device: None Gait Pattern/deviations: Step-through pattern     General Gait Details: Pt with good step through pattern and able to scan environment  Stairs Stairs: Yes Stairs assistance: Supervision Stair Management: One rail Left;Step to pattern Number of Stairs: 2 General stair comments: Step to pattern due to hip pain   Wheelchair Mobility    Modified Rankin (Stroke Patients Only)       Balance Overall balance assessment: No apparent balance deficits (not formally assessed)                                            Pertinent Vitals/Pain Pain Assessment: 0-10 Pain Score: 4  Pain Location: neck Pain Descriptors / Indicators: Sore Pain Intervention(s): Monitored during session    Home Living Family/patient expects to be discharged to:: Private residence Living Arrangements: Spouse/significant other Available Help at Discharge: Family;Available 24 hours/day Type of Home: House Home Access: Stairs to enter Entrance Stairs-Rails: Right;Left;Can reach both Entrance Stairs-Number of Steps: 2 Home Layout: Able to live on main level with bedroom/bathroom Home Equipment: Walker - 2 wheels;Cane - single point;Bedside commode;Shower seat;Adaptive equipment Additional Comments: Pt with Bil Knee replacement and Hip replacement, all DME and AE    Prior Function Level of Independence: Independent               Hand Dominance   Dominant Hand: Right    Extremity/Trunk Assessment   Upper Extremity Assessment: Defer to OT evaluation           Lower Extremity Assessment: Overall WFL for tasks assessed      Cervical / Trunk Assessment: Other exceptions  Communication   Communication: No difficulties  Cognition Arousal/Alertness: Awake/alert Behavior During Therapy: WFL for tasks assessed/performed Overall Cognitive Status: Within Functional Limits for tasks assessed                      General Comments      Exercises     Assessment/Plan    PT Assessment Patent does not need any further PT services  PT Problem List            PT Treatment Interventions      PT Goals (Current goals can be found in the Care Plan section)  Acute Rehab PT Goals Patient Stated Goal: home today PT Goal Formulation: All assessment and education complete, DC therapy    Frequency     Barriers to discharge        Co-evaluation               End of Session   Activity Tolerance: Patient tolerated treatment well Patient left: in bed Nurse Communication:  Mobility status    Functional Assessment Tool Used: objective findings and clinical judgement Functional Limitation: Mobility: Walking and moving around Mobility: Walking and Moving Around Current Status ([id number]): At least 1 percent but less than 20 percent impaired, limited or restricted Mobility: Walking and Moving Around Goal Status [phone]): At least 1 percent but less than 20 percent impaired, limited or restricted Mobility: Walking and Moving Around Discharge Status [phone]): At least 1 percent but less than 20 percent impaired, limited or restricted    Time: 0905-0916 PT Time Calculation (min) (ACUTE ONLY): 11 min   Charges:   PT Evaluation $PT Eval Low Complexity: 1 Procedure     PT G Codes:   PT G-Codes **NOT FOR INPATIENT CLASS** Functional Assessment Tool Used: objective findings and clinical judgement Functional Limitation: Mobility: Walking and moving around Mobility: Walking and Moving Around Current Status ([id number]): At least 1 percent but less than 20 percent impaired, limited or restricted Mobility: Walking and Moving Around Goal Status [phone]): At least 1 percent but less than 20 percent impaired, limited or restricted Mobility: Walking and Moving Around Discharge Status [phone]): At least 1 percent but less than 20 percent impaired, limited or restricted    [facility]UB[facility] 02/18/2016, 10:52 AM

## 2016-02-19 MED FILL — Thrombin For Soln 20000 Unit: CUTANEOUS | Qty: 1 | Status: AC

## 2016-02-23 NOTE — Progress Notes (Signed)
OT Note: Addendum    02/18/16 1131  OT Visit Information  Last OT Received On 02/18/16  OT G-codes **NOT FOR INPATIENT CLASS**  Functional Assessment Tool Used Clinical Judgement  Functional Limitation Self care  Self Care Current Status [phone]([id number]) CJ  Self Care Goal Status ([phone](G8988) CI  Self Care Discharge Status [phone](G8989) Irven EasterlyJ   Sherryl MangesLaura Bekki Tavenner OTR/L [phone]

## 2016-02-24 NOTE — Discharge Summary (Signed)
Physician Discharge Summary  Patient ID: Regina Munoz MRN: [id number] DOB/AGE: 10-15-1950 65 y.o.  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 Anxiety   . Arthritis   . Asthma   . Complication of anesthesia   . Diabetes mellitus without complication (HCC)   . Dysrhythmia    palpitations ->20 yrs stress test neg nothing since  . GERD (gastroesophageal reflux disease)   . Headache   . PONV (postoperative nausea and vomiting)     Surgeries: Procedure(s): ANTERIOR CERVICAL DECOMPRESSION/DISCECTOMY FUSION 1 LEVEL C4-5 on 02/17/2016   Consultants (if any):   Discharged Condition: Improved  Hospital Course: Regina Sarkatricia T Altergott is an 65 y.o. female who was admitted 02/17/2016 with a diagnosis of Cervical disc herniation with RUE radicular pain and went to the operating room on 02/17/2016 and underwent the above named procedures.  Post op day 1 - pt mobilizing with PT.  Decreased arm pain.  Pt has incisional pain well controlled on oral meds. Pt urinating w/o difficulty. Pt discharged with home health.  She was given perioperative antibiotics:  Anti-infectives    Start     Dose/Rate Route Frequency Ordered Stop   02/17/16 1600  ceFAZolin (ANCEF) IVPB 1 g/50 mL premix     1 g 100 mL/hr over 30 Minutes Intravenous Every 8 hours 02/17/16 1333 02/17/16 2349   02/17/16 0554  ceFAZolin (ANCEF) IVPB 2g/100 mL premix     2 g 200 mL/hr over 30 Minutes Intravenous 30 min pre-op 02/17/16 0554 02/17/16 0849    .  She was given sequential compression devices, early ambulation, and TED for DVT prophylaxis.  She benefited maximally from the hospital stay and there were no complications.    Recent vital signs:  Vitals:   02/18/16 0415 02/18/16 0753  BP: (!) 127/57 (!) 104/51  Pulse: 88 79  Resp: 20 18  Temp: 98.4 F (36.9  C) 97.8 F (36.6 C)    Recent laboratory studies:  Lab Results  Component Value Date   HGB 11.4 (L) 02/10/2016   Lab Results  Component Value Date   WBC 7.6 02/10/2016   PLT 197 02/10/2016   No results found for: INR Lab Results  Component Value Date   NA 139 02/10/2016   K 4.2 02/10/2016   CL 107 02/10/2016   CO2 24 02/10/2016   BUN 9 02/10/2016   CREATININE 0.82 02/10/2016   GLUCOSE 101 (H) 02/10/2016    Discharge Medications:     Medication List    STOP taking these medications   HYDROcodone-acetaminophen 10-325 MG tablet Commonly known as:  NORCO     TAKE these medications   acetaminophen 500 MG tablet Commonly known as:  TYLENOL Take 1,000 mg by mouth every 6 (six) hours as needed for moderate pain.   albuterol 108 (90 Base) MCG/ACT inhaler Commonly known as:  PROVENTIL HFA;VENTOLIN HFA Inhale 2 puffs into the lungs every 6 (six) hours as needed for wheezing or shortness of breath.   amoxicillin 500 MG capsule Commonly known as:  AMOXIL Take 2,000 mg by mouth as directed. An hour before dentist appointment   aspirin EC 81 MG tablet Take 81 mg by mouth daily.   cholecalciferol 1000 units tablet Commonly known as:  VITAMIN D Take 1,000 Units by mouth daily.   citalopram 20 MG tablet Commonly known  as:  CELEXA Take 20 mg by mouth daily.   metFORMIN 500 MG tablet Commonly known as:  GLUCOPHAGE Take 500 mg by mouth 2 (two) times daily with a meal.   methocarbamol 500 MG tablet Commonly known as:  ROBAXIN Take 1 tablet (500 mg total) by mouth 3 (three) times daily as needed for muscle spasms.   omeprazole 20 MG capsule Commonly known as:  PRILOSEC Take 20 mg by mouth daily.   ondansetron 4 MG tablet Commonly known as:  ZOFRAN Take 1 tablet (4 mg total) by mouth every 8 (eight) hours as needed for nausea or vomiting.   oxyCODONE-acetaminophen 10-325 MG tablet Commonly known as:  PERCOCET Take 1 tablet by mouth every 4 (four) hours as needed  for pain.   phentermine 37.5 MG tablet Commonly known as:  ADIPEX-P Take 18.75 mg by mouth daily before breakfast.   sitaGLIPtin 100 MG tablet Commonly known as:  JANUVIA Take 100 mg by mouth daily.   TRULICITY 1.5 MG/0.5ML Sopn Generic drug:  Dulaglutide Inject 1.5 mg into the skin once a week.       Diagnostic Studies: Dg Cervical Spine 2 Or 3 Views  Result Date: 02/17/2016 CLINICAL DATA:  65 year old female status post ACDF. Initial encounter. EXAM: CERVICAL SPINE - 2-3 VIEW COMPARISON:  Intraoperative images 10 49 hours today. FINDINGS: Portable AP and cross-table lateral views of the cervical spine. Sequelae of C4-C5 ACDF with partially metal interbody implant. Prevertebral soft tissue contour appears normal for the postoperative setting. Hardware appears intact. AP cervical alignment within normal limits. Evidence of some levoconvex upper thoracic scoliosis. Negative lung apices. IMPRESSION: C4-C5 ACDF with no adverse features. Electronically Signed   By: Odessa Fleming M.D.   On: 02/17/2016 12:26   Dg Cervical Spine 2-3 Views  Result Date: 02/17/2016 CLINICAL DATA:  Status post anterior fusion at C4-5 EXAM: DG C-ARM 61-120 MIN; CERVICAL SPINE - 2-3 VIEW COMPARISON:  None. FINDINGS: Frontal and lateral views were obtained. The patient is status post anterior screw and plate fixation at C4 and C5. Support hardware is intact. Disc spacer at C4-5 present. No fracture or spondylolisthesis. There is moderate disc space narrowing at C5-6 and C6-7. No bony destruction evident. IMPRESSION: Status post anterior screw and plate fixation at C4 and C5 with disc spacer placed at C4-5. Support hardware intact. No fracture or spondylolisthesis. Moderate disc space narrowing noted at C5-6 and C6-7. Electronically Signed   By: Bretta Bang III M.D.   On: 02/17/2016 11:08   Dg C-arm 1-60 Min  Result Date: 02/17/2016 CLINICAL DATA:  Status post anterior fusion at C4-5 EXAM: DG C-ARM 61-120 MIN; CERVICAL  SPINE - 2-3 VIEW COMPARISON:  None. FINDINGS: Frontal and lateral views were obtained. The patient is status post anterior screw and plate fixation at C4 and C5. Support hardware is intact. Disc spacer at C4-5 present. No fracture or spondylolisthesis. There is moderate disc space narrowing at C5-6 and C6-7. No bony destruction evident. IMPRESSION: Status post anterior screw and plate fixation at C4 and C5 with disc spacer placed at C4-5. Support hardware intact. No fracture or spondylolisthesis. Moderate disc space narrowing noted at C5-6 and C6-7. Electronically Signed   By: Bretta Bang III M.D.   On: 02/17/2016 11:08    Disposition: 01-Home or Self Care Post op medication provided Pt will present in 2 weeks to clinic Home health ordered for the pt   Follow-up Information    BROOKS,DAHARI D, MD. Schedule an appointment as soon  as possible for a visit in 2 weeks.   Specialty:  Orthopedic Surgery Why:  If symptoms worsen, For suture removal, For wound re-check Contact information: [address] [place] [address] [phone]            Signed: Kirt Boys 02/24/2016, 3:45 PM

## 2016-04-04 ENCOUNTER — Ambulatory Visit: Payer: Medicare HMO | Attending: Orthopedic Surgery | Admitting: Physical Therapy

## 2016-04-04 DIAGNOSIS — M542 Cervicalgia: Secondary | ICD-10-CM | POA: Insufficient documentation

## 2016-04-04 DIAGNOSIS — R293 Abnormal posture: Secondary | ICD-10-CM

## 2016-04-04 NOTE — Therapy (Signed)
[facility] [address] [place], [place], [address] Phone: [phone]   Fax:  [phone]  Physical Therapy Evaluation  Patient Details  Name: Regina Munoz MRN: [id number] Date of Birth: [birth year] Referring Provider: Venita Lick MD.  Encounter Date: 04/04/2016      PT End of Session - 04/04/16 1446    Visit Number 1   Number of Visits 12   Date for PT Re-Evaluation 06/03/16   PT Start Time 0945   PT Stop Time 1039   PT Time Calculation (min) 54 min   Activity Tolerance Patient tolerated treatment well   Behavior During Therapy [facility] for tasks assessed/performed      Past Medical History:  Diagnosis Date  . Anxiety   . Arthritis   . Asthma   . Complication of anesthesia   . Diabetes mellitus without complication (HCC)   . Dysrhythmia    palpitations ->20 yrs stress test neg nothing since  . GERD (gastroesophageal reflux disease)   . Headache   . PONV (postoperative nausea and vomiting)     Past Surgical History:  Procedure Laterality Date  . ABDOMINAL HYSTERECTOMY    . ANTERIOR CERVICAL DECOMP/DISCECTOMY FUSION N/A 02/17/2016   Procedure: ANTERIOR CERVICAL DECOMPRESSION/DISCECTOMY FUSION 1 LEVEL C4-5;  Surgeon: Venita Lick, MD;  Location: [facility];  Service: Orthopedics;  Laterality: N/A;  . EYE SURGERY Bilateral    cataracts  . HIP CLOSED REDUCTION Right 12/24/2012   Procedure: CLOSED REDUCTION HIP;  Surgeon: Emilee Hero, MD;  Location: [facility];  Service: Orthopedics;  Laterality: Right;  . JOINT REPLACEMENT  2002   right and left knees  . TOTAL HIP ARTHROPLASTY Right    2002    There were no vitals filed for this visit.       Subjective Assessment - 04/04/16 1440    Subjective The patient underwent a cervical fusion on 02/17/16.  She is very pleased with her progress and states:  "I can feel my hands again."  Her CC is that of headaches and she rates her pain at a 6/10 today.  She takes Tylenol to  decrease her pain and states that stress increases her pain.  She tried to sing at church recently but states her voice isn't quite back yet.   Patient Stated Goals Get rid of pain and headaches.   Currently in Pain? Yes   Pain Score 6    Pain Location Neck   Pain Orientation Posterior   Pain Descriptors / Indicators Aching   Pain Type Surgical pain   Pain Onset More than a month ago   Pain Frequency Constant   Aggravating Factors  Please see above.   Pain Relieving Factors Please see above.            [facility] PT Assessment - 04/04/16 0001      Assessment   Medical Diagnosis Cervical fusion.   Referring Provider Venita Lick MD.   Onset Date/Surgical Date --  02/17/16 (surgery date).     Precautions   Precautions --  Please follow cervical fusion protocol.     Restrictions   Weight Bearing Restrictions No     Balance Screen   Has the patient fallen in the past 6 months No   Has the patient had a decrease in activity level because of a fear of falling?  No   Is the patient reluctant to leave their home because of a fear of falling?  No     Home  [employer] manag[employer]   Living Environment Private residence     Prior Function   Level of Independence Independent     Posture/Postural Control   Posture/Postural Control Postural limitations   Postural Limitations Rounded Shoulders;Forward head     ROM / Strength   AROM / PROM / Strength AROM;Strength     AROM   Overall AROM Comments left active cervical rotation= 60 degrees and right= 65 degrees.     Strength   Overall Strength Comments Normal UE strength at elbows, wrists and hands.     Palpation   Palpation comment Tenderness reported over bilateral cervical paraspinal musculature which are taut.     Special Tests    Special Tests --  Normal bilateral UE DTR's.     Ambulation/Gait   Gait Comments WNL.                   [facility] Adult PT Treatment/Exercise - 04/04/16 0001      Modalities   Modalities  Electrical Stimulation;Moist Heat     Moist Heat Therapy   Number Minutes Moist Heat 20 Minutes   Moist Heat Location Cervical     Electrical Stimulation   Electrical Stimulation Location Cervical.   Electrical Stimulation Action IFC   Electrical Stimulation Parameters 80-150 Hz at 100% scan   Electrical Stimulation Goals Pain                  PT Short Term Goals - 04/04/16 1450      PT SHORT TERM GOAL #1   Title Ind with HEP.   Time 2   Period Weeks   Status New           PT Long Term Goals - 04/04/16 1450      PT LONG TERM GOAL #1   Title Bilateral active cervical rotation= 65-70 degrees so she can turn her head more easily while driving.   Time 4   Period Weeks   Status New     PT LONG TERM GOAL #2   Title Eliminate headaches.   Time 4   Period Weeks   Status New     PT LONG TERM GOAL #3   Title Perform ADL's with pain not > 3/10.   Time 4   Period Weeks   Status New               Plan - 04/04/16 1446    Clinical Impression Statement The patient presents with bilateral palpable tenderness and tautness over bilateral cervical paraspinal musculature.  She has some loss of active cervical range of motion as expected.  UE reflexes are intact and she reports her feeling in her hands has returned since having surgery.   Rehab Potential Excellent   PT Frequency 3x / week   PT Duration 4 weeks   PT Treatment/Interventions ADLs/Self Care Home Management;Electrical Stimulation;Moist Heat;Ultrasound;Patient/family education;Therapeutic exercise;Therapeutic activities;Manual techniques   PT Next Visit Plan STW/M to cervical spine musculature; HMP and STW/M.  Please follow cervical fusion protocol.      Patient will benefit from skilled therapeutic intervention in order to improve the following deficits and impairments:  Pain, Decreased activity tolerance, Decreased range of motion, Postural dysfunction  Visit Diagnosis: Cervicalgia - Plan: PT plan  of care cert/re-cert  Abnormal posture - Plan: PT plan of care cert/re-cert      G-Codes - 04/04/16 1415    Functional Assessment Tool Used Clinical judgement.   Functional Limitation Other PT primary   Other  PT Primary Current Status [phone]) At least 20 percent but less than 40 percent impaired, limited or restricted   Other PT Primary Goal Status [phone]) At least 1 percent but less than 20 percent impaired, limited or restricted       Problem List Patient Active Problem List   Diagnosis Date Noted  . Neck pain 02/17/2016    Leldon Steege, [place] MPT 04/04/2016, 2:59 PM  [facility] [address] [place], [place], [address] Phone: [phone]   Fax:  [phone]  Name: Regina Munoz MRN: [id number] Date of Birth: 12/08/1950

## 2016-04-13 ENCOUNTER — Ambulatory Visit: Payer: Medicare HMO | Admitting: Physical Therapy

## 2016-04-13 ENCOUNTER — Encounter: Payer: Self-pay | Admitting: Physical Therapy

## 2016-04-13 DIAGNOSIS — M542 Cervicalgia: Secondary | ICD-10-CM | POA: Diagnosis not present

## 2016-04-13 DIAGNOSIS — R293 Abnormal posture: Secondary | ICD-10-CM

## 2016-04-13 NOTE — Patient Instructions (Addendum)
AROM: Neck Rotation   Turn head slowly to look over one shoulder, then the other. Hold each position _10___ seconds. Repeat _5___ times per set. Do __2__ sets per session. Do _2-3___ sessions per day.   AROM: Lateral Neck Flexion   Slowly tilt head toward one shoulder, then the other. Hold each position _10___ seconds. Repeat __5__ times per set. Do __2__ sets per session. Do __2-3__ sessions per day.  Stretch Break - Chest and Shoulder Stretch   Maintaining erect posture, draw shoulders back while bringing elbows back and inward. Return to starting position. Repeat __10-20__ times every _3-4___ hours.

## 2016-04-13 NOTE — Therapy (Signed)
[facility] [place], [place], [address] Phone: [phone]   Fax:  [phone]  Physical Therapy Treatment  Patient Details  Name: Regina Munoz MRN: [id number] Date of Birth: 09-17-1950 Referring Provider: Melina Schools MD.  Encounter Date: 04/13/2016      PT End of Session - 04/13/16 1330    Visit Number 2   Number of Visits 12   Date for PT Re-Evaluation 06/03/16   PT Start Time 1315   PT Stop Time 1359   PT Time Calculation (min) 44 min   Activity Tolerance Patient tolerated treatment well   Behavior During Therapy [facility] for tasks assessed/performed      Past Medical History:  Diagnosis Date  . Anxiety   . Arthritis   . Asthma   . Complication of anesthesia   . Diabetes mellitus without complication ([place])   . Dysrhythmia    palpitations ->20 yrs stress test neg nothing since  . GERD (gastroesophageal reflux disease)   . Headache   . PONV (postoperative nausea and vomiting)     Past Surgical History:  Procedure Laterality Date  . ABDOMINAL HYSTERECTOMY    . ANTERIOR CERVICAL DECOMP/DISCECTOMY FUSION N/A 02/17/2016   Procedure: ANTERIOR CERVICAL DECOMPRESSION/DISCECTOMY FUSION 1 LEVEL C4-5;  Surgeon: Melina Schools, MD;  Location: [place];  Service: Orthopedics;  Laterality: N/A;  . EYE SURGERY Bilateral    cataracts  . HIP CLOSED REDUCTION Right 12/24/2012   Procedure: CLOSED REDUCTION HIP;  Surgeon: Sinclair Ship, MD;  Location: [facility];  Service: Orthopedics;  Laterality: Right;  . JOINT REPLACEMENT  2002   right and left knees  . TOTAL HIP ARTHROPLASTY Right    2002    There were no vitals filed for this visit.      Subjective Assessment - 04/13/16 1323    Subjective Patient reported stiffness in neck today   Patient Stated Goals Get rid of pain and headaches.   Currently in Pain? Yes   Pain Score 6    Pain Location Neck   Pain Orientation Right   Pain Descriptors / Indicators  Aching;Tightness   Pain Type Surgical pain   Pain Onset More than a month ago   Pain Frequency Constant   Aggravating Factors  certain movements   Pain Relieving Factors heat and rest                         [facility] Adult PT Treatment/Exercise - 04/13/16 0001      Moist Heat Therapy   Number Minutes Moist Heat 15 Minutes   Moist Heat Location Cervical     Electrical Stimulation   Electrical Stimulation Location Cervical.   Electrical Stimulation Action IFC   Electrical Stimulation Parameters 80-150hz  [id number]   Electrical Stimulation Goals Pain     Manual Therapy   Manual Therapy Soft tissue mobilization   Soft tissue mobilization gentle manual STW to bil cervical paraspinals and esp right levator and area of pain and tight muscles                PT Education - 04/13/16 1334    Education provided Yes   Education Details HEP   Person(s) Educated Patient   Methods Explanation;Demonstration;Handout   Comprehension Returned demonstration;Verbalized understanding          PT Short Term Goals - 04/13/16 1334      PT SHORT TERM GOAL #1   Title Ind with HEP.  Time 2   Period Weeks   Status Achieved           PT Long Term Goals - 04/04/16 1450      PT LONG TERM GOAL #1   Title Bilateral active cervical rotation= 65-70 degrees so she can turn her head more easily while driving.   Time 4   Period Weeks   Status New     PT LONG TERM GOAL #2   Title Eliminate headaches.   Time 4   Period Weeks   Status New     PT LONG TERM GOAL #3   Title Perform ADL's with pain not > 3/10.   Time 4   Period Weeks   Status New               Plan - 04/13/16 1331    Clinical Impression Statement Patient tolerated treatment well today. Patient arrived with stiffness and pain in c-spine today and has increased pain with certain movements esp in right side. Patient had less pain after therapy today. HEP given for self ROM in c-spine. Patient met STG  #1 and other goals ongoing due to ROM and pain deficts.   Rehab Potential Excellent   Clinical Impairments Affecting Rehab Potential surgery 02/17/16 currently 9 weeks 04/13/16   PT Frequency 3x / week   PT Duration 4 weeks   PT Treatment/Interventions ADLs/Self Care Home Management;Electrical Stimulation;Moist Heat;Ultrasound;Patient/family education;Therapeutic exercise;Therapeutic activities;Manual techniques   PT Next Visit Plan STW/M to cervical spine musculature; HMP and STW/M.  Please follow cervical fusion protocol.   Consulted and Agree with Plan of Care Patient      Patient will benefit from skilled therapeutic intervention in order to improve the following deficits and impairments:  Pain, Decreased activity tolerance, Decreased range of motion, Postural dysfunction  Visit Diagnosis: Cervicalgia  Abnormal posture     Problem List Patient Active Problem List   Diagnosis Date Noted  . Neck pain 02/17/2016    Aryana Wonnacott P, PTA 04/13/2016, 2:06 PM  [facility] [place], [place], [address] Phone: [phone]   Fax:  [phone]  Name: Regina Munoz MRN: [id number] Date of Birth: [birth year]

## 2016-04-15 ENCOUNTER — Ambulatory Visit: Payer: Medicare HMO | Attending: Orthopedic Surgery | Admitting: Physical Therapy

## 2016-04-15 DIAGNOSIS — M25552 Pain in left hip: Secondary | ICD-10-CM | POA: Insufficient documentation

## 2016-04-15 DIAGNOSIS — R293 Abnormal posture: Secondary | ICD-10-CM | POA: Insufficient documentation

## 2016-04-15 DIAGNOSIS — M542 Cervicalgia: Secondary | ICD-10-CM | POA: Insufficient documentation

## 2016-04-19 ENCOUNTER — Ambulatory Visit: Payer: Medicare HMO | Admitting: *Deleted

## 2016-04-19 DIAGNOSIS — R293 Abnormal posture: Secondary | ICD-10-CM | POA: Diagnosis present

## 2016-04-19 DIAGNOSIS — M25552 Pain in left hip: Secondary | ICD-10-CM

## 2016-04-19 DIAGNOSIS — M542 Cervicalgia: Secondary | ICD-10-CM | POA: Diagnosis not present

## 2016-04-19 NOTE — Therapy (Signed)
[facility] [address] [place], [place], [address] Phone: [phone]   Fax:  [phone]  Physical Therapy Treatment  Patient Details  Name: Regina Munoz MRN: [id number] Date of Birth: Feb 13, 1951 Referring Provider: Venita Lickahari Brooks MD.  Encounter Date: 04/19/2016      PT End of Session - 04/19/16 1301    Visit Number 3   Number of Visits 12   Date for PT Re-Evaluation 06/03/16   PT Start Time 1115   PT Stop Time 1205   PT Time Calculation (min) 50 min      Past Medical History:  Diagnosis Date  . Anxiety   . Arthritis   . Asthma   . Complication of anesthesia   . Diabetes mellitus without complication (HCC)   . Dysrhythmia    palpitations ->20 yrs stress test neg nothing since  . GERD (gastroesophageal reflux disease)   . Headache   . PONV (postoperative nausea and vomiting)     Past Surgical History:  Procedure Laterality Date  . ABDOMINAL HYSTERECTOMY    . ANTERIOR CERVICAL DECOMP/DISCECTOMY FUSION N/A 02/17/2016   Procedure: ANTERIOR CERVICAL DECOMPRESSION/DISCECTOMY FUSION 1 LEVEL C4-5;  Surgeon: Venita Lickahari Brooks, MD;  Location: [facility];  Service: Orthopedics;  Laterality: N/A;  . EYE SURGERY Bilateral    cataracts  . HIP CLOSED REDUCTION Right 12/24/2012   Procedure: CLOSED REDUCTION HIP;  Surgeon: Emilee HeroMark Leonard Dumonski, MD;  Location: [facility];  Service: Orthopedics;  Laterality: Right;  . JOINT REPLACEMENT  2002   right and left knees  . TOTAL HIP ARTHROPLASTY Right    2002    There were no vitals filed for this visit.      Subjective Assessment - 04/19/16 1116    Subjective Patient reported stiffness in neck today   Patient Stated Goals Get rid of pain and headaches.   Currently in Pain? Yes                         [facility] Adult PT Treatment/Exercise - 04/19/16 0001      Modalities   Modalities Electrical Stimulation;Moist Heat;Ultrasound     Moist Heat Therapy   Number Minutes Moist  Heat 15 Minutes   Moist Heat Location Cervical     Electrical Stimulation   Electrical Stimulation Location Cervical. IFC x 15 mins 80-150hz  in sitting   Electrical Stimulation Goals Pain     Ultrasound   Ultrasound Location Rt UT and levator   Ultrasound Parameters 1.5 w/cm2 x 10 mins in sitting   Ultrasound Goals Pain     Manual Therapy   Manual Therapy Soft tissue mobilization   Soft tissue mobilization IASTM and  manual STW to RT cervical paraspinals , UT, and right levator  in sitting                  PT Short Term Goals - 04/13/16 1334      PT SHORT TERM GOAL #1   Title Ind with HEP.   Time 2   Period Weeks   Status Achieved           PT Long Term Goals - 04/04/16 1450      PT LONG TERM GOAL #1   Title Bilateral active cervical rotation= 65-70 degrees so she can turn her head more easily while driving.   Time 4   Period Weeks   Status New     PT LONG TERM GOAL #2   Title  Eliminate headaches.   Time 4   Period Weeks   Status New     PT LONG TERM GOAL #3   Title Perform ADL's with pain not > 3/10.   Time 4   Period Weeks   Status New               Plan - 04/19/16 1204    Clinical Impression Statement Pt did fairly well today with Rx. She was still fairly tight on RT side UT and in levator. There was notable decrease in tension after US and STW. Normal response to modalities. She continues to perform her HEP at home and feels that they are helping.  She does feel that she over does it sometimes at home.   Rehab Potential Excellent   Clinical Impairments Affecting Rehab Potential surgery 02/17/16 currently 9 weeks 04/13/16   PT Frequency 3x / week   PT Duration 4 weeks   PT Treatment/Interventions ADLs/Self Care Home Management;Electrical Stimulation;Moist Heat;Ultrasound;Patient/family education;Therapeutic exercise;Therapeutic activities;Manual techniques   PT Next Visit Plan STW/M to cervical spine musculature; HMP and STW/M.  Please  follow cervical fusion protocol.   Consulted and Agree with Plan of Care Patient      Patient will benefit from skilled therapeutic intervention in order to improve the following deficits and impairments:  Pain, Decreased activity tolerance, Decreased range of motion, Postural dysfunction  Visit Diagnosis: Cervicalgia  Abnormal posture  Left hip pain     Problem List Patient Active Problem List   Diagnosis Date Noted  . Neck pain 02/17/2016    RAMSEUR,CHRIS, PTA 04/19/2016, 1:02 PM  [facility] [address] [place], [place], [address] Phone: [phone]   Fax:  [phone]  Name: Regina Sarkatricia T Schraeder MRN: [id number] Date of Birth: 1951/01/12

## 2016-04-21 ENCOUNTER — Ambulatory Visit: Payer: Medicare HMO | Admitting: Physical Therapy

## 2016-04-21 ENCOUNTER — Encounter: Payer: Self-pay | Admitting: Physical Therapy

## 2016-04-21 DIAGNOSIS — M542 Cervicalgia: Secondary | ICD-10-CM | POA: Diagnosis not present

## 2016-04-21 DIAGNOSIS — R293 Abnormal posture: Secondary | ICD-10-CM

## 2016-04-21 NOTE — Therapy (Addendum)
[facility] [place], [place], [address] Phone: [phone]   Fax:  [phone]  Physical Therapy Treatment  Patient Details  Name: Regina Munoz MRN: [id number] Date of Birth: Dec 08, 1950 Referring Provider: Melina Schools MD.  Encounter Date: 04/21/2016    Past Medical History:  Diagnosis Date  . Anxiety   . Arthritis   . Asthma   . Complication of anesthesia   . Diabetes mellitus without complication ([facility])   . Dysrhythmia    palpitations ->20 yrs stress test neg nothing since  . GERD (gastroesophageal reflux disease)   . Headache   . PONV (postoperative nausea and vomiting)     Past Surgical History:  Procedure Laterality Date  . ABDOMINAL HYSTERECTOMY    . ANTERIOR CERVICAL DECOMP/DISCECTOMY FUSION N/A 02/17/2016   Procedure: ANTERIOR CERVICAL DECOMPRESSION/DISCECTOMY FUSION 1 LEVEL C4-5;  Surgeon: Melina Schools, MD;  Location: [facility];  Service: Orthopedics;  Laterality: N/A;  . EYE SURGERY Bilateral    cataracts  . HIP CLOSED REDUCTION Right 12/24/2012   Procedure: CLOSED REDUCTION HIP;  Surgeon: Sinclair Ship, MD;  Location: [facility];  Service: Orthopedics;  Laterality: Right;  . JOINT REPLACEMENT  2002   right and left knees  . TOTAL HIP ARTHROPLASTY Right    2002    There were no vitals filed for this visit.                                 PT Short Term Goals - 04/13/16 1334      PT SHORT TERM GOAL #1   Title Ind with HEP.   Time 2   Period Weeks   Status Achieved           PT Long Term Goals - 04/21/16 1119      PT LONG TERM GOAL #1   Title Bilateral active cervical rotation= 65-70 degrees so she can turn her head more easily while driving.   Time 4   Period Weeks   Status Achieved  bil AROM 70 degrees 04/21/16     PT LONG TERM GOAL #2   Title Eliminate headaches.   Period Weeks   Status Achieved     PT LONG TERM GOAL #3   Title Perform ADL's with  pain not > 3/10.   Time 4   Period Weeks   Status Achieved             Patient will benefit from skilled therapeutic intervention in order to improve the following deficits and impairments:  Pain, Decreased activity tolerance, Decreased range of motion, Postural dysfunction  Visit Diagnosis: Cervicalgia  Abnormal posture     Problem List Patient Active Problem List   Diagnosis Date Noted  . Neck pain 02/17/2016    APPLE[place]ATE, [place], MPT 04/27/2016, 6:19 PM   [facility] [address] [place], [place], [address] Phone: [phone]   Fax:  [phone]  Name: Regina Munoz MRN: [id number] Date of Birth: 1951/01/27 PHYSICAL THERAPY DISCHARGE SUMMARY  Visits from Start of Care:  4.  Current functional level related to goals / functional outcomes: See above.   Remaining deficits: All goals met.   Education / Equipment: HEP. Plan: Patient agrees to discharge.  Patient goals were met. Patient is being discharged due to meeting the stated rehab goals.  ?????         Mali Applegate MPT

## 2016-04-21 NOTE — Patient Instructions (Signed)
Strengthening: Lateral Bend - Isometric (in Neutral)    Using light pressure from fingertips, press into right then left temple. Resist bending head sideways. Hold __10__ seconds. Repeat _5___ times per set. Do _2___ sets per session. Do _2___ sessions per day.   Strengthening: Flexion - Isometric (in Neutral)    Using light pressure from fingertips at forehead, resist bending head forward. Hold __10__ seconds. Repeat __5__ times per set. Do __2__ sets per session. Do _2___ sessions per day.   Strengthening: Extension - Isometric (in Neutral)    Using light pressure from fingertips at back of head, resist bending head backward. Hold _10___ seconds. Repeat __5__ times per set. Do _2___ sets per session. Do __2__ sessions per day.   Scapular Retraction: Bilateral   Facing anchor, pull arms back, bringing shoulder blades together. Repeat _30___ times per set. Do __1-2__ sets per session. Do _1-2___ sessions per day.

## 2016-04-26 ENCOUNTER — Ambulatory Visit: Payer: Medicare HMO | Admitting: Physical Therapy

## 2016-04-28 ENCOUNTER — Ambulatory Visit: Payer: Medicare HMO | Admitting: *Deleted

## 2016-05-17 DIAGNOSIS — M50021 Cervical disc disorder at C4-C5 level with myelopathy: Secondary | ICD-10-CM | POA: Diagnosis not present

## 2016-05-17 DIAGNOSIS — M50321 Other cervical disc degeneration at C4-C5 level: Secondary | ICD-10-CM | POA: Diagnosis not present

## 2016-06-06 DIAGNOSIS — Z1231 Encounter for screening mammogram for malignant neoplasm of breast: Secondary | ICD-10-CM | POA: Diagnosis not present

## 2016-07-18 DIAGNOSIS — E119 Type 2 diabetes mellitus without complications: Secondary | ICD-10-CM | POA: Diagnosis not present

## 2016-07-18 DIAGNOSIS — Z961 Presence of intraocular lens: Secondary | ICD-10-CM | POA: Diagnosis not present

## 2016-07-18 DIAGNOSIS — H43813 Vitreous degeneration, bilateral: Secondary | ICD-10-CM | POA: Diagnosis not present

## 2016-07-18 DIAGNOSIS — Z7984 Long term (current) use of oral hypoglycemic drugs: Secondary | ICD-10-CM | POA: Diagnosis not present

## 2016-08-01 DIAGNOSIS — I10 Essential (primary) hypertension: Secondary | ICD-10-CM | POA: Diagnosis not present

## 2016-08-01 DIAGNOSIS — E785 Hyperlipidemia, unspecified: Secondary | ICD-10-CM | POA: Diagnosis not present

## 2016-08-01 DIAGNOSIS — Z20828 Contact with and (suspected) exposure to other viral communicable diseases: Secondary | ICD-10-CM | POA: Diagnosis not present

## 2016-08-01 DIAGNOSIS — E669 Obesity, unspecified: Secondary | ICD-10-CM | POA: Diagnosis not present

## 2016-08-01 DIAGNOSIS — E119 Type 2 diabetes mellitus without complications: Secondary | ICD-10-CM | POA: Diagnosis not present

## 2016-08-01 DIAGNOSIS — E782 Mixed hyperlipidemia: Secondary | ICD-10-CM | POA: Diagnosis not present

## 2016-08-01 DIAGNOSIS — Z6841 Body Mass Index (BMI) 40.0 and over, adult: Secondary | ICD-10-CM | POA: Diagnosis not present

## 2016-08-01 DIAGNOSIS — Z79899 Other long term (current) drug therapy: Secondary | ICD-10-CM | POA: Diagnosis not present

## 2016-08-08 DIAGNOSIS — M50321 Other cervical disc degeneration at C4-C5 level: Secondary | ICD-10-CM | POA: Diagnosis not present

## 2016-08-08 DIAGNOSIS — Z4789 Encounter for other orthopedic aftercare: Secondary | ICD-10-CM | POA: Diagnosis not present

## 2016-08-08 DIAGNOSIS — M50322 Other cervical disc degeneration at C5-C6 level: Secondary | ICD-10-CM | POA: Diagnosis not present

## 2016-11-01 DIAGNOSIS — Z7984 Long term (current) use of oral hypoglycemic drugs: Secondary | ICD-10-CM | POA: Diagnosis not present

## 2016-11-01 DIAGNOSIS — F418 Other specified anxiety disorders: Secondary | ICD-10-CM | POA: Diagnosis not present

## 2016-11-01 DIAGNOSIS — Z79899 Other long term (current) drug therapy: Secondary | ICD-10-CM | POA: Diagnosis not present

## 2016-11-01 DIAGNOSIS — Z6841 Body Mass Index (BMI) 40.0 and over, adult: Secondary | ICD-10-CM | POA: Diagnosis not present

## 2016-11-01 DIAGNOSIS — G2581 Restless legs syndrome: Secondary | ICD-10-CM | POA: Diagnosis not present

## 2016-11-01 DIAGNOSIS — E785 Hyperlipidemia, unspecified: Secondary | ICD-10-CM | POA: Diagnosis not present

## 2016-11-01 DIAGNOSIS — E119 Type 2 diabetes mellitus without complications: Secondary | ICD-10-CM | POA: Diagnosis not present

## 2016-11-01 DIAGNOSIS — E669 Obesity, unspecified: Secondary | ICD-10-CM | POA: Diagnosis not present

## 2016-11-01 DIAGNOSIS — K219 Gastro-esophageal reflux disease without esophagitis: Secondary | ICD-10-CM | POA: Diagnosis not present

## 2016-11-01 DIAGNOSIS — Z20828 Contact with and (suspected) exposure to other viral communicable diseases: Secondary | ICD-10-CM | POA: Diagnosis not present

## 2016-11-01 DIAGNOSIS — R5383 Other fatigue: Secondary | ICD-10-CM | POA: Diagnosis not present

## 2016-11-01 DIAGNOSIS — I10 Essential (primary) hypertension: Secondary | ICD-10-CM | POA: Diagnosis not present

## 2016-11-07 DIAGNOSIS — Z4789 Encounter for other orthopedic aftercare: Secondary | ICD-10-CM | POA: Diagnosis not present

## 2016-11-07 DIAGNOSIS — M50322 Other cervical disc degeneration at C5-C6 level: Secondary | ICD-10-CM | POA: Diagnosis not present

## 2016-11-07 DIAGNOSIS — M50321 Other cervical disc degeneration at C4-C5 level: Secondary | ICD-10-CM | POA: Diagnosis not present

## 2017-02-02 DIAGNOSIS — I10 Essential (primary) hypertension: Secondary | ICD-10-CM | POA: Diagnosis not present

## 2017-02-02 DIAGNOSIS — K219 Gastro-esophageal reflux disease without esophagitis: Secondary | ICD-10-CM | POA: Diagnosis not present

## 2017-02-02 DIAGNOSIS — E119 Type 2 diabetes mellitus without complications: Secondary | ICD-10-CM | POA: Diagnosis not present

## 2017-02-02 DIAGNOSIS — E785 Hyperlipidemia, unspecified: Secondary | ICD-10-CM | POA: Diagnosis not present

## 2017-02-02 DIAGNOSIS — G2581 Restless legs syndrome: Secondary | ICD-10-CM | POA: Diagnosis not present

## 2017-02-02 DIAGNOSIS — Z79899 Other long term (current) drug therapy: Secondary | ICD-10-CM | POA: Diagnosis not present

## 2017-02-02 DIAGNOSIS — M659 Synovitis and tenosynovitis, unspecified: Secondary | ICD-10-CM | POA: Diagnosis not present

## 2017-02-02 DIAGNOSIS — E669 Obesity, unspecified: Secondary | ICD-10-CM | POA: Diagnosis not present

## 2017-02-13 DIAGNOSIS — M50321 Other cervical disc degeneration at C4-C5 level: Secondary | ICD-10-CM | POA: Diagnosis not present

## 2017-02-13 DIAGNOSIS — Z09 Encounter for follow-up examination after completed treatment for conditions other than malignant neoplasm: Secondary | ICD-10-CM | POA: Diagnosis not present

## 2017-03-03 DIAGNOSIS — M65341 Trigger finger, right ring finger: Secondary | ICD-10-CM | POA: Diagnosis not present

## 2017-03-03 DIAGNOSIS — G5603 Carpal tunnel syndrome, bilateral upper limbs: Secondary | ICD-10-CM | POA: Diagnosis not present

## 2017-03-03 DIAGNOSIS — M1812 Unilateral primary osteoarthritis of first carpometacarpal joint, left hand: Secondary | ICD-10-CM | POA: Diagnosis not present

## 2017-04-14 DIAGNOSIS — M1812 Unilateral primary osteoarthritis of first carpometacarpal joint, left hand: Secondary | ICD-10-CM | POA: Diagnosis not present

## 2017-05-04 DIAGNOSIS — Z79899 Other long term (current) drug therapy: Secondary | ICD-10-CM | POA: Diagnosis not present

## 2017-05-04 DIAGNOSIS — G2581 Restless legs syndrome: Secondary | ICD-10-CM | POA: Diagnosis not present

## 2017-05-04 DIAGNOSIS — K219 Gastro-esophageal reflux disease without esophagitis: Secondary | ICD-10-CM | POA: Diagnosis not present

## 2017-05-04 DIAGNOSIS — E782 Mixed hyperlipidemia: Secondary | ICD-10-CM | POA: Diagnosis not present

## 2017-05-04 DIAGNOSIS — F418 Other specified anxiety disorders: Secondary | ICD-10-CM | POA: Diagnosis not present

## 2017-05-04 DIAGNOSIS — I10 Essential (primary) hypertension: Secondary | ICD-10-CM | POA: Diagnosis not present

## 2017-05-04 DIAGNOSIS — E119 Type 2 diabetes mellitus without complications: Secondary | ICD-10-CM | POA: Diagnosis not present

## 2017-05-04 DIAGNOSIS — Z6841 Body Mass Index (BMI) 40.0 and over, adult: Secondary | ICD-10-CM | POA: Diagnosis not present

## 2017-05-04 DIAGNOSIS — E785 Hyperlipidemia, unspecified: Secondary | ICD-10-CM | POA: Diagnosis not present

## 2017-05-04 DIAGNOSIS — E669 Obesity, unspecified: Secondary | ICD-10-CM | POA: Diagnosis not present

## 2017-05-04 DIAGNOSIS — Z7984 Long term (current) use of oral hypoglycemic drugs: Secondary | ICD-10-CM | POA: Diagnosis not present

## 2017-05-04 DIAGNOSIS — Z20828 Contact with and (suspected) exposure to other viral communicable diseases: Secondary | ICD-10-CM | POA: Diagnosis not present

## 2017-05-04 DIAGNOSIS — R5383 Other fatigue: Secondary | ICD-10-CM | POA: Diagnosis not present

## 2017-05-26 DIAGNOSIS — M1812 Unilateral primary osteoarthritis of first carpometacarpal joint, left hand: Secondary | ICD-10-CM | POA: Diagnosis not present

## 2017-06-13 DIAGNOSIS — Z1231 Encounter for screening mammogram for malignant neoplasm of breast: Secondary | ICD-10-CM | POA: Diagnosis not present

## 2017-08-01 DIAGNOSIS — M1812 Unilateral primary osteoarthritis of first carpometacarpal joint, left hand: Secondary | ICD-10-CM | POA: Diagnosis not present

## 2017-08-02 DIAGNOSIS — R5383 Other fatigue: Secondary | ICD-10-CM | POA: Diagnosis not present

## 2017-08-02 DIAGNOSIS — Z7984 Long term (current) use of oral hypoglycemic drugs: Secondary | ICD-10-CM | POA: Diagnosis not present

## 2017-08-02 DIAGNOSIS — E669 Obesity, unspecified: Secondary | ICD-10-CM | POA: Diagnosis not present

## 2017-08-02 DIAGNOSIS — E785 Hyperlipidemia, unspecified: Secondary | ICD-10-CM | POA: Diagnosis not present

## 2017-08-02 DIAGNOSIS — Z6841 Body Mass Index (BMI) 40.0 and over, adult: Secondary | ICD-10-CM | POA: Diagnosis not present

## 2017-08-02 DIAGNOSIS — Z79899 Other long term (current) drug therapy: Secondary | ICD-10-CM | POA: Diagnosis not present

## 2017-08-02 DIAGNOSIS — I10 Essential (primary) hypertension: Secondary | ICD-10-CM | POA: Diagnosis not present

## 2017-08-02 DIAGNOSIS — E782 Mixed hyperlipidemia: Secondary | ICD-10-CM | POA: Diagnosis not present

## 2017-08-02 DIAGNOSIS — F418 Other specified anxiety disorders: Secondary | ICD-10-CM | POA: Diagnosis not present

## 2017-08-02 DIAGNOSIS — E119 Type 2 diabetes mellitus without complications: Secondary | ICD-10-CM | POA: Diagnosis not present

## 2017-08-02 DIAGNOSIS — G2581 Restless legs syndrome: Secondary | ICD-10-CM | POA: Diagnosis not present

## 2017-08-02 DIAGNOSIS — K219 Gastro-esophageal reflux disease without esophagitis: Secondary | ICD-10-CM | POA: Diagnosis not present

## 2017-08-29 DIAGNOSIS — M1812 Unilateral primary osteoarthritis of first carpometacarpal joint, left hand: Secondary | ICD-10-CM | POA: Diagnosis not present

## 2017-09-18 DIAGNOSIS — M4812 Ankylosing hyperostosis [Forestier], cervical region: Secondary | ICD-10-CM | POA: Diagnosis not present

## 2017-09-18 DIAGNOSIS — M13842 Other specified arthritis, left hand: Secondary | ICD-10-CM | POA: Diagnosis not present

## 2017-09-18 DIAGNOSIS — M1812 Unilateral primary osteoarthritis of first carpometacarpal joint, left hand: Secondary | ICD-10-CM | POA: Diagnosis not present

## 2017-09-18 DIAGNOSIS — G8918 Other acute postprocedural pain: Secondary | ICD-10-CM | POA: Diagnosis not present

## 2017-09-18 DIAGNOSIS — M19032 Primary osteoarthritis, left wrist: Secondary | ICD-10-CM | POA: Diagnosis not present

## 2017-10-03 DIAGNOSIS — Z4789 Encounter for other orthopedic aftercare: Secondary | ICD-10-CM | POA: Diagnosis not present

## 2017-10-03 DIAGNOSIS — M1812 Unilateral primary osteoarthritis of first carpometacarpal joint, left hand: Secondary | ICD-10-CM | POA: Diagnosis not present

## 2017-10-31 DIAGNOSIS — M1812 Unilateral primary osteoarthritis of first carpometacarpal joint, left hand: Secondary | ICD-10-CM | POA: Diagnosis not present

## 2017-10-31 DIAGNOSIS — M79645 Pain in left finger(s): Secondary | ICD-10-CM | POA: Diagnosis not present

## 2017-10-31 DIAGNOSIS — Z4789 Encounter for other orthopedic aftercare: Secondary | ICD-10-CM | POA: Diagnosis not present

## 2017-11-06 DIAGNOSIS — Z6841 Body Mass Index (BMI) 40.0 and over, adult: Secondary | ICD-10-CM | POA: Diagnosis not present

## 2017-11-06 DIAGNOSIS — K219 Gastro-esophageal reflux disease without esophagitis: Secondary | ICD-10-CM | POA: Diagnosis not present

## 2017-11-06 DIAGNOSIS — E785 Hyperlipidemia, unspecified: Secondary | ICD-10-CM | POA: Diagnosis not present

## 2017-11-06 DIAGNOSIS — Z7984 Long term (current) use of oral hypoglycemic drugs: Secondary | ICD-10-CM | POA: Diagnosis not present

## 2017-11-06 DIAGNOSIS — E782 Mixed hyperlipidemia: Secondary | ICD-10-CM | POA: Diagnosis not present

## 2017-11-06 DIAGNOSIS — E669 Obesity, unspecified: Secondary | ICD-10-CM | POA: Diagnosis not present

## 2017-11-06 DIAGNOSIS — E119 Type 2 diabetes mellitus without complications: Secondary | ICD-10-CM | POA: Diagnosis not present

## 2017-11-06 DIAGNOSIS — R5383 Other fatigue: Secondary | ICD-10-CM | POA: Diagnosis not present

## 2017-11-06 DIAGNOSIS — I10 Essential (primary) hypertension: Secondary | ICD-10-CM | POA: Diagnosis not present

## 2017-11-06 DIAGNOSIS — G894 Chronic pain syndrome: Secondary | ICD-10-CM | POA: Diagnosis not present

## 2017-11-06 DIAGNOSIS — Z79899 Other long term (current) drug therapy: Secondary | ICD-10-CM | POA: Diagnosis not present

## 2017-11-10 DIAGNOSIS — M25642 Stiffness of left hand, not elsewhere classified: Secondary | ICD-10-CM | POA: Diagnosis not present

## 2017-11-14 DIAGNOSIS — M25632 Stiffness of left wrist, not elsewhere classified: Secondary | ICD-10-CM | POA: Diagnosis not present

## 2017-11-28 DIAGNOSIS — M1812 Unilateral primary osteoarthritis of first carpometacarpal joint, left hand: Secondary | ICD-10-CM | POA: Diagnosis not present

## 2017-11-28 DIAGNOSIS — Z4789 Encounter for other orthopedic aftercare: Secondary | ICD-10-CM | POA: Diagnosis not present

## 2018-01-09 DIAGNOSIS — M1812 Unilateral primary osteoarthritis of first carpometacarpal joint, left hand: Secondary | ICD-10-CM | POA: Diagnosis not present

## 2018-01-09 DIAGNOSIS — Z4789 Encounter for other orthopedic aftercare: Secondary | ICD-10-CM | POA: Diagnosis not present

## 2018-02-06 DIAGNOSIS — Z79899 Other long term (current) drug therapy: Secondary | ICD-10-CM | POA: Diagnosis not present

## 2018-02-06 DIAGNOSIS — I10 Essential (primary) hypertension: Secondary | ICD-10-CM | POA: Diagnosis not present

## 2018-02-06 DIAGNOSIS — E782 Mixed hyperlipidemia: Secondary | ICD-10-CM | POA: Diagnosis not present

## 2018-02-06 DIAGNOSIS — E119 Type 2 diabetes mellitus without complications: Secondary | ICD-10-CM | POA: Diagnosis not present

## 2018-05-14 DIAGNOSIS — E119 Type 2 diabetes mellitus without complications: Secondary | ICD-10-CM | POA: Diagnosis not present

## 2018-05-14 DIAGNOSIS — E782 Mixed hyperlipidemia: Secondary | ICD-10-CM | POA: Diagnosis not present

## 2018-05-14 DIAGNOSIS — I10 Essential (primary) hypertension: Secondary | ICD-10-CM | POA: Diagnosis not present

## 2018-05-14 DIAGNOSIS — K219 Gastro-esophageal reflux disease without esophagitis: Secondary | ICD-10-CM | POA: Diagnosis not present

## 2018-05-14 DIAGNOSIS — G2581 Restless legs syndrome: Secondary | ICD-10-CM | POA: Diagnosis not present

## 2018-05-14 DIAGNOSIS — Z6841 Body Mass Index (BMI) 40.0 and over, adult: Secondary | ICD-10-CM | POA: Diagnosis not present

## 2018-05-14 DIAGNOSIS — E785 Hyperlipidemia, unspecified: Secondary | ICD-10-CM | POA: Diagnosis not present

## 2018-05-14 DIAGNOSIS — F418 Other specified anxiety disorders: Secondary | ICD-10-CM | POA: Diagnosis not present

## 2018-06-29 DIAGNOSIS — Z1231 Encounter for screening mammogram for malignant neoplasm of breast: Secondary | ICD-10-CM | POA: Diagnosis not present

## 2018-07-17 DIAGNOSIS — M542 Cervicalgia: Secondary | ICD-10-CM | POA: Diagnosis not present

## 2018-07-17 DIAGNOSIS — Z6841 Body Mass Index (BMI) 40.0 and over, adult: Secondary | ICD-10-CM | POA: Diagnosis not present

## 2018-07-25 ENCOUNTER — Other Ambulatory Visit: Payer: Self-pay

## 2018-07-25 ENCOUNTER — Ambulatory Visit: Payer: PPO | Attending: Orthopedic Surgery | Admitting: Physical Therapy

## 2018-07-25 ENCOUNTER — Encounter: Payer: Self-pay | Admitting: Physical Therapy

## 2018-07-25 DIAGNOSIS — R293 Abnormal posture: Secondary | ICD-10-CM | POA: Insufficient documentation

## 2018-07-25 DIAGNOSIS — M542 Cervicalgia: Secondary | ICD-10-CM | POA: Diagnosis not present

## 2018-07-25 NOTE — Therapy (Signed)
[facility] [address] [place], [place], [address] Phone: [phone]   Fax:  [phone]  Physical Therapy Evaluation  Patient Details  Name: Regina Munoz MRN: [id number] Date of Birth: Oct 05, 1950 Referring Provider (PT): Venita Lick MD   Encounter Date: 07/25/2018  PT End of Session - 07/25/18 1427    Visit Number  1    Number of Visits  8    Date for PT Re-Evaluation  08/22/18    PT Start Time  0100    PT Stop Time  0146    PT Time Calculation (min)  46 min    Activity Tolerance  Patient tolerated treatment well    Behavior During Therapy  [facility] for tasks assessed/performed       Past Medical History:  Diagnosis Date  . Anxiety   . Arthritis   . Asthma   . Complication of anesthesia   . Diabetes mellitus without complication ([facility])   . Dysrhythmia    palpitations ->20 yrs stress test neg nothing since  . GERD (gastroesophageal reflux disease)   . Headache   . PONV (postoperative nausea and vomiting)     Past Surgical History:  Procedure Laterality Date  . ABDOMINAL HYSTERECTOMY    . ANTERIOR CERVICAL DECOMP/DISCECTOMY FUSION N/A 02/17/2016   Procedure: ANTERIOR CERVICAL DECOMPRESSION/DISCECTOMY FUSION 1 LEVEL C4-5;  Surgeon: Venita Lick, MD;  Location: [facility];  Service: Orthopedics;  Laterality: N/A;  . EYE SURGERY Bilateral    cataracts  . HIP CLOSED REDUCTION Right 12/24/2012   Procedure: CLOSED REDUCTION HIP;  Surgeon: Emilee Hero, MD;  Location: [facility];  Service: Orthopedics;  Laterality: Right;  . JOINT REPLACEMENT  2002   right and left knees  . TOTAL HIP ARTHROPLASTY Right    2002    There were no vitals filed for this visit.   Subjective Assessment - 07/25/18 1432    Subjective  The patient presents to the clinic today with c/o neck pain and daily headaches.  She reports pain into both shoulders left > right.  Her pain today is a 4/10 but can rise to 7+/10 with certain neck postures.  Her  pain decreases with rest/lying down.      Pertinent History  C4-5, Right THA, DM.    How long can you sit comfortably?  5-10 degrees depending on position of neck.    Patient Stated Goals  Get rid of headaches and neck pain.    Currently in Pain?  Yes    Pain Score  4     Pain Location  Neck    Pain Orientation  Right;Left    Pain Descriptors / Indicators  Aching    Pain Type  Chronic pain    Pain Onset  More than a month ago    Pain Frequency  Constant    Aggravating Factors   See above.    Pain Relieving Factors  See above.         [facility] PT Assessment - 07/25/18 0001      Assessment   Medical Diagnosis  Cervicalgia.    Referring Provider (PT)  Venita Lick MD    Onset Date/Surgical Date  --   "Few months ago."     Precautions   Precautions  None      Restrictions   Weight Bearing Restrictions  No      Balance Screen   Has the patient fallen in the past 6 months  Yes    How  many times?  --   2.  Dog-related x 1.   Has the patient had a decrease in activity level because of a fear of falling?   Yes    Is the patient reluctant to leave their home because of a fear of falling?   Yes      Home Environment   Living Environment  Private residence      Prior Function   Level of Independence  Independent      Posture/Postural Control   Posture/Postural Control  Postural limitations    Postural Limitations  Rounded Shoulders;Forward head      Deep Tendon Reflexes   DTR Assessment Site  Biceps;Brachioradialis;Triceps    Biceps DTR  2+   Bilateral.   Brachioradialis DTR  2+   Bilateral.   Triceps DTR  2+   Bilateral.     ROM / Strength   AROM / PROM / Strength  AROM;Strength      AROM   Overall AROM Comments  Right active cervical rotation= 65 degrees and left= 52 degrees.  Bilateral shoulder AROM is WFL.      Strength   Overall Strength Comments  Bilateral shoudler ER= 4/5.      Palpation   Palpation comment  Tender to palption over bilateral suboccipital  region and UT's which increased tone noted.      Special Tests    Special Tests  Rotator Cuff Impingement    Rotator Cuff Impingment tests  Neer impingement test      Neer Impingement test    Findings  Positive    Side  Left      Ambulation/Gait   Gait Comments  WNL.                Objective measurements completed on examination: See above findings.      [facility] Adult PT Treatment/Exercise - 07/25/18 0001      Modalities   Modalities  Electrical Stimulation;Moist Heat               PT Short Term Goals - 07/25/18 1523      PT SHORT TERM GOAL #1   Title  Ind with HEP.    Time  2    Period  Weeks    Status  New        PT Long Term Goals - 07/25/18 1524      PT LONG TERM GOAL #1   Title  Bilateral active cervical rotation= 65-70 degrees so she can turn her head more easily while driving.    Time  4    Period  Weeks      PT LONG TERM GOAL #2   Title  Decrease headaches to no more than 2 per week.    Time  4    Period  Weeks    Status  New      PT LONG TERM GOAL #3   Title  Perform ADL's with pain not > 3/10.    Time  4    Period  Weeks    Status  New             Plan - 07/25/18 1457    Clinical Impression Statement  The patient presents to OPPT with c/o neck pain radiating into both shoulders and daily headaches.  She has limited cervical rotation especially to the left.  She is tender over both suboccipital region and increased tone and tenderness over both UT's.  She experienced pain reproduction with  an Impingement test on the left (shoulder).  Patient will benefit from skilled physical therapy intervention to address deficits and pain.      Personal Factors and Comorbidities  Age;Comorbidity 1    Comorbidities  C4-5 ACDF, DM.    Examination-Activity Limitations  Sit    Examination-Participation Restrictions  Church;Cleaning    Stability/Clinical Decision Making  Evolving/Moderate complexity    Clinical Decision Making  Low    Rehab  Potential  Good    PT Frequency  2x / week    PT Duration  4 weeks    PT Treatment/Interventions  ADLs/Self Care Home Management;Cryotherapy;Electrical Stimulation;Therapeutic activities;Therapeutic exercise;Patient/family education;Manual techniques;Passive range of motion;Dry needling    PT Next Visit Plan  In supine:  STW/M.Marland Kitchensuboccipital release technique, bilateral shoulder ER with theraband, U/S.  Postural improvement exercises. Dry needling.    Consulted and Agree with Plan of Care  Patient       Patient will benefit from skilled therapeutic intervention in order to improve the following deficits and impairments:  Pain, Decreased activity tolerance, Decreased range of motion, Postural dysfunction  Visit Diagnosis: Cervicalgia - Plan: PT plan of care cert/re-cert  Abnormal posture - Plan: PT plan of care cert/re-cert     Problem List Patient Active Problem List   Diagnosis Date Noted  . Neck pain 02/17/2016    Daily Doe, [place] MPT 07/25/2018, 3:29 PM  [facility] [address] [place], [place], [address] Phone: [phone]   Fax:  [phone]  Name: Regina Munoz MRN: [id number] Date of Birth: 1951/03/01

## 2018-07-31 ENCOUNTER — Ambulatory Visit: Payer: PPO | Admitting: Physical Therapy

## 2018-07-31 ENCOUNTER — Other Ambulatory Visit: Payer: Self-pay

## 2018-07-31 DIAGNOSIS — R293 Abnormal posture: Secondary | ICD-10-CM

## 2018-07-31 DIAGNOSIS — M542 Cervicalgia: Secondary | ICD-10-CM | POA: Diagnosis not present

## 2018-07-31 NOTE — Therapy (Signed)
[facility] [address] [place], [place], [address] Phone: [phone]   Fax:  [phone]  Physical Therapy Treatment  Patient Details  Name: Regina Munoz MRN: [id number] Date of Birth: 06/12/1950 Referring Provider (PT): Venita Lick MD   Encounter Date: 07/31/2018  PT End of Session - 07/31/18 1245    Visit Number  2    Number of Visits  8    Date for PT Re-Evaluation  08/22/18    PT Start Time  1115    PT Stop Time  1204    PT Time Calculation (min)  49 min    Activity Tolerance  Patient tolerated treatment well    Behavior During Therapy  [facility] for tasks assessed/performed       Past Medical History:  Diagnosis Date  . Anxiety   . Arthritis   . Asthma   . Complication of anesthesia   . Diabetes mellitus without complication ([facility])   . Dysrhythmia    palpitations ->20 yrs stress test neg nothing since  . GERD (gastroesophageal reflux disease)   . Headache   . PONV (postoperative nausea and vomiting)     Past Surgical History:  Procedure Laterality Date  . ABDOMINAL HYSTERECTOMY    . ANTERIOR CERVICAL DECOMP/DISCECTOMY FUSION N/A 02/17/2016   Procedure: ANTERIOR CERVICAL DECOMPRESSION/DISCECTOMY FUSION 1 LEVEL C4-5;  Surgeon: Venita Lick, MD;  [facility]: [facility];  Service: Orthopedics;  Laterality: N/A;  . EYE SURGERY Bilateral    cataracts  . HIP CLOSED REDUCTION Right 12/24/2012   Procedure: CLOSED REDUCTION HIP;  Surgeon: Emilee Hero, MD;  Location: [facility];  Service: Orthopedics;  Laterality: Right;  . JOINT REPLACEMENT  2002   right and left knees  . TOTAL HIP ARTHROPLASTY Right    2002    There were no vitals filed for this visit.  Subjective Assessment - 07/31/18 1245    Subjective  No new complaints.  Patient enjoyed treatment.    Currently in Pain?  Yes    Pain Score  4     Pain Location  Neck    Pain Orientation  Right;Left    Pain Type  Chronic pain    Pain Onset  More than a month ago                        [facility] Adult PT Treatment/Exercise - 07/31/18 0001      Modalities   Modalities  Electrical Stimulation;Ultrasound      Moist Heat Therapy   Number Minutes Moist Heat  20 Minutes    Moist Heat Location  Cervical      Electrical Stimulation   Electrical Stimulation Location  Cervical.    Electrical Stimulation Action  IFC    Electrical Stimulation Parameters  80-150 Hz x 20 minutes.    Electrical Stimulation Goals  Tone;Pain      Ultrasound   Ultrasound Location  Cervical.    Ultrasound Parameters  1.50 W/CM2 x 12 minutes.    Ultrasound Goals  Pain      Manual Therapy   Manual Therapy  Soft tissue mobilization    Soft tissue mobilization  STW/M x 12 to affected cervical region.               PT Short Term Goals - 07/25/18 1523      PT SHORT TERM GOAL #1   Title  Ind with HEP.    Time  2  Period  Weeks    Status  New        PT Long Term Goals - 07/25/18 1524      PT LONG TERM GOAL #1   Title  Bilateral active cervical rotation= 65-70 degrees so she can turn her head more easily while driving.    Time  4    Period  Weeks      PT LONG TERM GOAL #2   Title  Decrease headaches to no more than 2 per week.    Time  4    Period  Weeks    Status  New      PT LONG TERM GOAL #3   Title  Perform ADL's with pain not > 3/10.    Time  4    Period  Weeks    Status  New            Plan - 07/31/18 1248    Clinical Impression Statement  Patient did well today.  Continued increased tone over bilateral UT's.    PT Next Visit Plan  In supine:  STW/M.Marland Kitchensuboccipital release technique, bilateral shoulder ER with theraband, U/S.  Postural improvement exercises. Dry needling.    Consulted and Agree with Plan of Care  Patient       Patient will benefit from skilled therapeutic intervention in order to improve the following deficits and impairments:     Visit Diagnosis: Cervicalgia  Abnormal posture     Problem  List Patient Active Problem List   Diagnosis Date Noted  . Neck pain 02/17/2016    Archana Eckman, [place] MPT 07/31/2018, 12:49 PM  [facility] [address] [place], [place], [address] Phone: [phone]   Fax:  [phone]  Name: EVALY GINGER MRN: [id number] Date of Birth: August 09, 1950

## 2018-08-08 ENCOUNTER — Ambulatory Visit: Payer: PPO | Admitting: Physical Therapy

## 2018-08-13 DIAGNOSIS — E119 Type 2 diabetes mellitus without complications: Secondary | ICD-10-CM | POA: Diagnosis not present

## 2018-08-13 DIAGNOSIS — R5383 Other fatigue: Secondary | ICD-10-CM | POA: Diagnosis not present

## 2018-08-13 DIAGNOSIS — E785 Hyperlipidemia, unspecified: Secondary | ICD-10-CM | POA: Diagnosis not present

## 2018-08-13 DIAGNOSIS — H8110 Benign paroxysmal vertigo, unspecified ear: Secondary | ICD-10-CM | POA: Diagnosis not present

## 2018-08-13 DIAGNOSIS — I10 Essential (primary) hypertension: Secondary | ICD-10-CM | POA: Diagnosis not present

## 2018-08-15 ENCOUNTER — Telehealth: Payer: Self-pay | Admitting: Physical Therapy

## 2018-08-15 NOTE — Telephone Encounter (Signed)
  Regina Munoz was contacted today regarding the temporary reduction of OP Rehab Services due to concerns for community transmission of Covid-19.    PTA advised to contact her orthopedic office in regards to her pain and she thinks it will lead to her getting an injection. Outpatient Rehabilitation Services will follow up with this client. Patient is aware we can be reached by telephone during limited business hours in the meantime.

## 2018-08-30 DIAGNOSIS — E559 Vitamin D deficiency, unspecified: Secondary | ICD-10-CM | POA: Diagnosis not present

## 2018-08-30 DIAGNOSIS — Z7984 Long term (current) use of oral hypoglycemic drugs: Secondary | ICD-10-CM | POA: Diagnosis not present

## 2018-08-30 DIAGNOSIS — N182 Chronic kidney disease, stage 2 (mild): Secondary | ICD-10-CM | POA: Diagnosis not present

## 2018-08-30 DIAGNOSIS — Z7982 Long term (current) use of aspirin: Secondary | ICD-10-CM | POA: Diagnosis not present

## 2018-08-30 DIAGNOSIS — I129 Hypertensive chronic kidney disease with stage 1 through stage 4 chronic kidney disease, or unspecified chronic kidney disease: Secondary | ICD-10-CM | POA: Diagnosis not present

## 2018-08-30 DIAGNOSIS — E1122 Type 2 diabetes mellitus with diabetic chronic kidney disease: Secondary | ICD-10-CM | POA: Diagnosis not present

## 2018-08-30 DIAGNOSIS — M5033 Other cervical disc degeneration, cervicothoracic region: Secondary | ICD-10-CM | POA: Diagnosis not present

## 2018-08-30 DIAGNOSIS — J449 Chronic obstructive pulmonary disease, unspecified: Secondary | ICD-10-CM | POA: Diagnosis not present

## 2018-08-30 DIAGNOSIS — I2510 Atherosclerotic heart disease of native coronary artery without angina pectoris: Secondary | ICD-10-CM | POA: Diagnosis not present

## 2018-08-30 DIAGNOSIS — E785 Hyperlipidemia, unspecified: Secondary | ICD-10-CM | POA: Diagnosis not present

## 2018-08-30 DIAGNOSIS — D631 Anemia in chronic kidney disease: Secondary | ICD-10-CM | POA: Diagnosis not present

## 2018-08-30 DIAGNOSIS — G8929 Other chronic pain: Secondary | ICD-10-CM | POA: Diagnosis not present

## 2018-08-30 DIAGNOSIS — F3341 Major depressive disorder, recurrent, in partial remission: Secondary | ICD-10-CM | POA: Diagnosis not present

## 2018-08-30 DIAGNOSIS — M5030 Other cervical disc degeneration, unspecified cervical region: Secondary | ICD-10-CM | POA: Diagnosis not present

## 2018-08-30 DIAGNOSIS — S72402D Unspecified fracture of lower end of left femur, subsequent encounter for closed fracture with routine healing: Secondary | ICD-10-CM | POA: Diagnosis not present

## 2018-09-04 DIAGNOSIS — L237 Allergic contact dermatitis due to plants, except food: Secondary | ICD-10-CM | POA: Diagnosis not present

## 2018-09-06 DIAGNOSIS — L237 Allergic contact dermatitis due to plants, except food: Secondary | ICD-10-CM | POA: Diagnosis not present

## 2018-11-13 DIAGNOSIS — E119 Type 2 diabetes mellitus without complications: Secondary | ICD-10-CM | POA: Diagnosis not present

## 2018-11-13 DIAGNOSIS — E669 Obesity, unspecified: Secondary | ICD-10-CM | POA: Diagnosis not present

## 2018-11-13 DIAGNOSIS — K219 Gastro-esophageal reflux disease without esophagitis: Secondary | ICD-10-CM | POA: Diagnosis not present

## 2018-11-13 DIAGNOSIS — I10 Essential (primary) hypertension: Secondary | ICD-10-CM | POA: Diagnosis not present

## 2018-11-13 DIAGNOSIS — E782 Mixed hyperlipidemia: Secondary | ICD-10-CM | POA: Diagnosis not present

## 2018-11-13 DIAGNOSIS — F418 Other specified anxiety disorders: Secondary | ICD-10-CM | POA: Diagnosis not present

## 2018-11-19 DIAGNOSIS — Z981 Arthrodesis status: Secondary | ICD-10-CM | POA: Diagnosis not present

## 2018-11-19 DIAGNOSIS — M25512 Pain in left shoulder: Secondary | ICD-10-CM | POA: Diagnosis not present

## 2018-11-19 DIAGNOSIS — M25519 Pain in unspecified shoulder: Secondary | ICD-10-CM | POA: Diagnosis not present

## 2018-11-19 DIAGNOSIS — M542 Cervicalgia: Secondary | ICD-10-CM | POA: Diagnosis not present

## 2018-12-10 DIAGNOSIS — M25512 Pain in left shoulder: Secondary | ICD-10-CM | POA: Diagnosis not present

## 2018-12-19 DIAGNOSIS — M25512 Pain in left shoulder: Secondary | ICD-10-CM | POA: Diagnosis not present

## 2018-12-19 DIAGNOSIS — M19012 Primary osteoarthritis, left shoulder: Secondary | ICD-10-CM | POA: Diagnosis not present

## 2019-01-04 DIAGNOSIS — M25512 Pain in left shoulder: Secondary | ICD-10-CM | POA: Diagnosis not present

## 2019-02-04 DIAGNOSIS — M25512 Pain in left shoulder: Secondary | ICD-10-CM | POA: Diagnosis not present

## 2019-02-13 DIAGNOSIS — K219 Gastro-esophageal reflux disease without esophagitis: Secondary | ICD-10-CM | POA: Diagnosis not present

## 2019-02-13 DIAGNOSIS — Z6841 Body Mass Index (BMI) 40.0 and over, adult: Secondary | ICD-10-CM | POA: Diagnosis not present

## 2019-02-13 DIAGNOSIS — E119 Type 2 diabetes mellitus without complications: Secondary | ICD-10-CM | POA: Diagnosis not present

## 2019-02-13 DIAGNOSIS — F418 Other specified anxiety disorders: Secondary | ICD-10-CM | POA: Diagnosis not present

## 2019-02-13 DIAGNOSIS — I10 Essential (primary) hypertension: Secondary | ICD-10-CM | POA: Diagnosis not present

## 2019-02-13 DIAGNOSIS — Z23 Encounter for immunization: Secondary | ICD-10-CM | POA: Diagnosis not present

## 2019-02-19 DIAGNOSIS — M94212 Chondromalacia, left shoulder: Secondary | ICD-10-CM | POA: Diagnosis not present

## 2019-02-19 DIAGNOSIS — S46192A Other injury of muscle, fascia and tendon of long head of biceps, left arm, initial encounter: Secondary | ICD-10-CM | POA: Diagnosis not present

## 2019-02-19 DIAGNOSIS — G8918 Other acute postprocedural pain: Secondary | ICD-10-CM | POA: Diagnosis not present

## 2019-02-19 DIAGNOSIS — M19012 Primary osteoarthritis, left shoulder: Secondary | ICD-10-CM | POA: Diagnosis not present

## 2019-02-19 DIAGNOSIS — M7522 Bicipital tendinitis, left shoulder: Secondary | ICD-10-CM | POA: Diagnosis not present

## 2019-02-19 DIAGNOSIS — M24012 Loose body in left shoulder: Secondary | ICD-10-CM | POA: Diagnosis not present

## 2019-04-03 DIAGNOSIS — L509 Urticaria, unspecified: Secondary | ICD-10-CM | POA: Diagnosis not present

## 2019-04-15 ENCOUNTER — Other Ambulatory Visit: Payer: Self-pay

## 2019-04-15 DIAGNOSIS — Z20822 Contact with and (suspected) exposure to covid-19: Secondary | ICD-10-CM

## 2019-04-16 LAB — NOVEL CORONAVIRUS, NAA: SARS-CoV-2, NAA: DETECTED — AB

## 2019-04-17 ENCOUNTER — Telehealth: Payer: Self-pay | Admitting: Unknown Physician Specialty

## 2019-04-17 NOTE — Telephone Encounter (Signed)
Called to discussed monoclonal antibody infusion for Covid. Pt with symptoms greater than 10 days and does not qualify

## 2019-04-21 ENCOUNTER — Encounter (HOSPITAL_COMMUNITY): Payer: Self-pay | Admitting: Emergency Medicine

## 2019-04-21 ENCOUNTER — Inpatient Hospital Stay (HOSPITAL_COMMUNITY)
Admission: EM | Admit: 2019-04-21 | Discharge: 2019-05-02 | DRG: 177 | Disposition: A | Discharge status: Home / Self Care | Payer: PPO | Attending: Internal Medicine | Admitting: Internal Medicine

## 2019-04-21 ENCOUNTER — Emergency Department (HOSPITAL_COMMUNITY): Payer: PPO

## 2019-04-21 ENCOUNTER — Other Ambulatory Visit: Payer: Self-pay

## 2019-04-21 DIAGNOSIS — Z981 Arthrodesis status: Secondary | ICD-10-CM

## 2019-04-21 DIAGNOSIS — Z7982 Long term (current) use of aspirin: Secondary | ICD-10-CM

## 2019-04-21 DIAGNOSIS — K661 Hemoperitoneum: Secondary | ICD-10-CM | POA: Diagnosis not present

## 2019-04-21 DIAGNOSIS — K219 Gastro-esophageal reflux disease without esophagitis: Secondary | ICD-10-CM | POA: Diagnosis not present

## 2019-04-21 DIAGNOSIS — I82409 Acute embolism and thrombosis of unspecified deep veins of unspecified lower extremity: Secondary | ICD-10-CM

## 2019-04-21 DIAGNOSIS — R58 Hemorrhage, not elsewhere classified: Secondary | ICD-10-CM

## 2019-04-21 DIAGNOSIS — F419 Anxiety disorder, unspecified: Secondary | ICD-10-CM | POA: Diagnosis not present

## 2019-04-21 DIAGNOSIS — R0602 Shortness of breath: Secondary | ICD-10-CM | POA: Diagnosis not present

## 2019-04-21 DIAGNOSIS — J45901 Unspecified asthma with (acute) exacerbation: Secondary | ICD-10-CM | POA: Diagnosis present

## 2019-04-21 DIAGNOSIS — Y92239 Unspecified place in hospital as the place of occurrence of the external cause: Secondary | ICD-10-CM | POA: Diagnosis not present

## 2019-04-21 DIAGNOSIS — N9489 Other specified conditions associated with female genital organs and menstrual cycle: Secondary | ICD-10-CM | POA: Diagnosis not present

## 2019-04-21 DIAGNOSIS — U071 COVID-19: Secondary | ICD-10-CM | POA: Diagnosis not present

## 2019-04-21 DIAGNOSIS — J9601 Acute respiratory failure with hypoxia: Secondary | ICD-10-CM | POA: Diagnosis present

## 2019-04-21 DIAGNOSIS — J45909 Unspecified asthma, uncomplicated: Secondary | ICD-10-CM | POA: Diagnosis not present

## 2019-04-21 DIAGNOSIS — I9589 Other hypotension: Secondary | ICD-10-CM | POA: Diagnosis not present

## 2019-04-21 DIAGNOSIS — I2692 Saddle embolus of pulmonary artery without acute cor pulmonale: Secondary | ICD-10-CM | POA: Diagnosis not present

## 2019-04-21 DIAGNOSIS — Z96641 Presence of right artificial hip joint: Secondary | ICD-10-CM | POA: Diagnosis present

## 2019-04-21 DIAGNOSIS — I959 Hypotension, unspecified: Secondary | ICD-10-CM | POA: Diagnosis not present

## 2019-04-21 DIAGNOSIS — J1282 Pneumonia due to coronavirus disease 2019: Secondary | ICD-10-CM

## 2019-04-21 DIAGNOSIS — K8020 Calculus of gallbladder without cholecystitis without obstruction: Secondary | ICD-10-CM | POA: Diagnosis not present

## 2019-04-21 DIAGNOSIS — I82552 Chronic embolism and thrombosis of left peroneal vein: Secondary | ICD-10-CM | POA: Diagnosis present

## 2019-04-21 DIAGNOSIS — I82220 Acute embolism and thrombosis of inferior vena cava: Secondary | ICD-10-CM | POA: Diagnosis not present

## 2019-04-21 DIAGNOSIS — I248 Other forms of acute ischemic heart disease: Secondary | ICD-10-CM | POA: Diagnosis not present

## 2019-04-21 DIAGNOSIS — D62 Acute posthemorrhagic anemia: Secondary | ICD-10-CM | POA: Diagnosis not present

## 2019-04-21 DIAGNOSIS — Y848 Other medical procedures as the cause of abnormal reaction of the patient, or of later complication, without mention of misadventure at the time of the procedure: Secondary | ICD-10-CM | POA: Diagnosis present

## 2019-04-21 DIAGNOSIS — I2699 Other pulmonary embolism without acute cor pulmonale: Secondary | ICD-10-CM | POA: Diagnosis not present

## 2019-04-21 DIAGNOSIS — R0902 Hypoxemia: Secondary | ICD-10-CM

## 2019-04-21 DIAGNOSIS — I4581 Long QT syndrome: Secondary | ICD-10-CM | POA: Diagnosis not present

## 2019-04-21 DIAGNOSIS — E872 Acidosis: Secondary | ICD-10-CM | POA: Diagnosis present

## 2019-04-21 DIAGNOSIS — J1289 Other viral pneumonia: Secondary | ICD-10-CM | POA: Diagnosis present

## 2019-04-21 DIAGNOSIS — T8089XA Other complications following infusion, transfusion and therapeutic injection, initial encounter: Secondary | ICD-10-CM | POA: Diagnosis not present

## 2019-04-21 DIAGNOSIS — Z96653 Presence of artificial knee joint, bilateral: Secondary | ICD-10-CM | POA: Diagnosis present

## 2019-04-21 DIAGNOSIS — L508 Other urticaria: Secondary | ICD-10-CM | POA: Diagnosis not present

## 2019-04-21 DIAGNOSIS — R000 Tachycardia, unspecified: Secondary | ICD-10-CM | POA: Diagnosis not present

## 2019-04-21 DIAGNOSIS — Z79899 Other long term (current) drug therapy: Secondary | ICD-10-CM

## 2019-04-21 DIAGNOSIS — M7981 Nontraumatic hematoma of soft tissue: Secondary | ICD-10-CM | POA: Diagnosis not present

## 2019-04-21 DIAGNOSIS — I82542 Chronic embolism and thrombosis of left tibial vein: Secondary | ICD-10-CM | POA: Diagnosis present

## 2019-04-21 DIAGNOSIS — E119 Type 2 diabetes mellitus without complications: Secondary | ICD-10-CM

## 2019-04-21 DIAGNOSIS — E1165 Type 2 diabetes mellitus with hyperglycemia: Secondary | ICD-10-CM | POA: Diagnosis not present

## 2019-04-21 DIAGNOSIS — Z6841 Body Mass Index (BMI) 40.0 and over, adult: Secondary | ICD-10-CM | POA: Diagnosis not present

## 2019-04-21 DIAGNOSIS — T45515A Adverse effect of anticoagulants, initial encounter: Secondary | ICD-10-CM | POA: Diagnosis not present

## 2019-04-21 DIAGNOSIS — F329 Major depressive disorder, single episode, unspecified: Secondary | ICD-10-CM | POA: Diagnosis present

## 2019-04-21 DIAGNOSIS — I2609 Other pulmonary embolism with acute cor pulmonale: Secondary | ICD-10-CM | POA: Diagnosis not present

## 2019-04-21 DIAGNOSIS — Z7984 Long term (current) use of oral hypoglycemic drugs: Secondary | ICD-10-CM

## 2019-04-21 DIAGNOSIS — E861 Hypovolemia: Secondary | ICD-10-CM | POA: Diagnosis not present

## 2019-04-21 HISTORY — DX: COVID-19: U07.1

## 2019-04-21 LAB — CBC WITH DIFFERENTIAL/PLATELET
Abs Immature Granulocytes: 0.07 10*3/uL (ref 0.00–0.07)
Basophils Absolute: 0 10*3/uL (ref 0.0–0.1)
Basophils Relative: 1 %
Eosinophils Absolute: 0.1 10*3/uL (ref 0.0–0.5)
Eosinophils Relative: 1 %
HCT: 44.5 % (ref 36.0–46.0)
Hemoglobin: 13.3 g/dL (ref 12.0–15.0)
Immature Granulocytes: 1 %
Lymphocytes Relative: 17 %
Lymphs Abs: 1.2 10*3/uL (ref 0.7–4.0)
MCH: 24 pg — ABNORMAL LOW (ref 26.0–34.0)
MCHC: 29.9 g/dL — ABNORMAL LOW (ref 30.0–36.0)
MCV: 80.3 fL (ref 80.0–100.0)
Monocytes Absolute: 0.6 10*3/uL (ref 0.1–1.0)
Monocytes Relative: 8 %
Neutro Abs: 5.1 10*3/uL (ref 1.7–7.7)
Neutrophils Relative %: 72 %
Platelets: 134 10*3/uL — ABNORMAL LOW (ref 150–400)
RBC: 5.54 MIL/uL — ABNORMAL HIGH (ref 3.87–5.11)
RDW: 24 % — ABNORMAL HIGH (ref 11.5–15.5)
WBC: 7.1 10*3/uL (ref 4.0–10.5)
nRBC: 0 % (ref 0.0–0.2)

## 2019-04-21 LAB — COMPREHENSIVE METABOLIC PANEL
ALT: 17 U/L (ref 0–44)
AST: 25 U/L (ref 15–41)
Albumin: 3.1 g/dL — ABNORMAL LOW (ref 3.5–5.0)
Alkaline Phosphatase: 84 U/L (ref 38–126)
Anion gap: 15 (ref 5–15)
BUN: 12 mg/dL (ref 8–23)
CO2: 24 mmol/L (ref 22–32)
Calcium: 8.7 mg/dL — ABNORMAL LOW (ref 8.9–10.3)
Chloride: 99 mmol/L (ref 98–111)
Creatinine, Ser: 0.84 mg/dL (ref 0.44–1.00)
GFR calc Af Amer: >60 mL/min (ref >60)
GFR calc non Af Amer: >60 mL/min (ref >60)
Glucose, Bld: 221 mg/dL — ABNORMAL HIGH (ref 70–99)
Potassium: 3.8 mmol/L (ref 3.5–5.1)
Sodium: 138 mmol/L (ref 135–145)
Total Bilirubin: 1.2 mg/dL (ref 0.3–1.2)
Total Protein: 7.5 g/dL (ref 6.5–8.1)

## 2019-04-21 LAB — C-REACTIVE PROTEIN: CRP: 10.8 mg/dL — ABNORMAL HIGH (ref <1.0)

## 2019-04-21 LAB — LACTIC ACID, PLASMA
Lactic Acid, Venous: 2.8 mmol/L (ref 0.5–1.9)
Lactic Acid, Venous: 1.6 mmol/L (ref 0.5–1.9)

## 2019-04-21 LAB — D-DIMER, QUANTITATIVE: D-Dimer, Quant: 8.26 ug/mL-FEU — ABNORMAL HIGH (ref 0.00–0.50)

## 2019-04-21 LAB — LACTATE DEHYDROGENASE: LDH: 265 U/L — ABNORMAL HIGH (ref 98–192)

## 2019-04-21 LAB — PROCALCITONIN: Procalcitonin: <0.1 ng/mL

## 2019-04-21 LAB — TRIGLYCERIDES: Triglycerides: 123 mg/dL (ref <150)

## 2019-04-21 LAB — FERRITIN: Ferritin: 40 ng/mL (ref 11–307)

## 2019-04-21 LAB — FIBRINOGEN: Fibrinogen: 532 mg/dL — ABNORMAL HIGH (ref 210–475)

## 2019-04-21 MED ORDER — SODIUM CHLORIDE 0.9 % IV SOLN
100.0000 mg | Freq: Every day | INTRAVENOUS | Status: AC
  Administered 2019-04-22 – 2019-04-25 (×4): 100 mg via INTRAVENOUS
  Filled 2019-04-21: qty 100
  Filled 2019-04-21 (×4): qty 20

## 2019-04-21 MED ORDER — ALBUTEROL SULFATE HFA 108 (90 BASE) MCG/ACT IN AERS
2.0000 | INHALATION_SPRAY | RESPIRATORY_TRACT | Status: DC | PRN
  Administered 2019-04-21 – 2019-04-24 (×2): 2 via RESPIRATORY_TRACT
  Filled 2019-04-21: qty 6.7

## 2019-04-21 MED ORDER — SODIUM CHLORIDE 0.9 % IV BOLUS
1000.0000 mL | Freq: Once | INTRAVENOUS | Status: AC
  Administered 2019-04-21: 1000 mL via INTRAVENOUS

## 2019-04-21 MED ORDER — SODIUM CHLORIDE 0.9 % IV SOLN
200.0000 mg | Freq: Once | INTRAVENOUS | Status: AC
  Administered 2019-04-21: 21:00:00 200 mg via INTRAVENOUS
  Filled 2019-04-21: qty 40

## 2019-04-21 MED ORDER — DEXAMETHASONE SODIUM PHOSPHATE 10 MG/ML IJ SOLN
6.0000 mg | INTRAMUSCULAR | Status: DC
  Administered 2019-04-21 – 2019-04-28 (×7): 6 mg via INTRAVENOUS
  Filled 2019-04-21 (×5): qty 1
  Filled 2019-04-21: qty 0.6

## 2019-04-21 NOTE — ED Notes (Signed)
CRITICAL VALUE ALERT  Critical Value:  Lactic acid 2.8  Date & Time Notied:  04/21/2019, 1831  Provider Notified: Dr. Rogene Houston  Orders Received/Actions taken: see chart

## 2019-04-21 NOTE — ED Provider Notes (Addendum)
[facility] EMERGENCY DEPARTMENT Provider Note   CSN: [id number] Arrival date & time: 04/21/19  1556     History   Chief Complaint Chief Complaint  Patient presents with   Shortness of Breath    HPI Regina Munoz is a 68 y.o. female.     Patient formally diagnosed with a COVID-19 infection on November 30.  Patient states she had symptoms several days prior to that.  Said total number of days symptoms about 2 weeks she estimates.  Today she started to get significantly more short of breath.  The patient stated that it has been gradually getting worse since Monday.  Patient's oxygen saturations on arrival were around 84% on room air.  Patient placed on 3 L sats are 94%.  Remains a little tachycardic and a little tachypneic.  Not febrile here.  Patient able to talk in complete sentences.  Patient states that she has had loss of taste but not loss of smell.  Past medical history is significant for history of asthma, diabetes.     Past Medical History:  Diagnosis Date   Anxiety    Arthritis    Asthma    Complication of anesthesia    COVID-19    Diabetes mellitus without complication (HCC)    Dysrhythmia    palpitations ->20 yrs stress test neg nothing since   GERD (gastroesophageal reflux disease)    Headache    PONV (postoperative nausea and vomiting)     Patient Active Problem List   Diagnosis Date Noted   Neck pain 02/17/2016    Past Surgical History:  Procedure Laterality Date   ABDOMINAL HYSTERECTOMY     ANTERIOR CERVICAL DECOMP/DISCECTOMY FUSION N/A 02/17/2016   Procedure: ANTERIOR CERVICAL DECOMPRESSION/DISCECTOMY FUSION 1 LEVEL C4-5;  Surgeon: Venita Lickahari Brooks, MD;  Location: [facility] OR;  Service: Orthopedics;  Laterality: N/A;   EYE SURGERY Bilateral    cataracts   HIP CLOSED REDUCTION Right 12/24/2012   Procedure: CLOSED REDUCTION HIP;  Surgeon: Emilee HeroMark Leonard Dumonski, MD;  Location: [facility] ORS;  Service: Orthopedics;  Laterality: Right;   JOINT  REPLACEMENT  2002   right and left knees   SHOULDER ARTHROSCOPY Left    TOTAL HIP ARTHROPLASTY Right    2002     OB History   No obstetric history on file.      Home Medications    Prior to Admission medications   Medication Sig Start Date End Date Taking? Authorizing Provider  acetaminophen (TYLENOL) 500 MG tablet Take 1,000 mg by mouth every 6 (six) hours as needed for mild pain or moderate pain.    Yes [provider]  aspirin EC 81 MG tablet Take 81 mg by mouth daily.   Yes [provider]  buPROPion (ZYBAN) 150 MG 12 hr tablet Take 75 mg by mouth every morning.  03/28/19  Yes [provider]  cetirizine (ZYRTEC) 10 MG tablet Take 10 mg by mouth daily. 02/13/19  Yes [provider]  cholecalciferol (VITAMIN D) 1000 UNITS tablet Take 1,000 Units by mouth daily.   Yes [provider]  diclofenac Sodium (VOLTAREN) 1 % GEL Apply 2 g topically daily as needed (for pain).    Yes [provider]  metFORMIN (GLUCOPHAGE) 500 MG tablet Take 500 mg by mouth 2 (two) times daily with a meal.    Yes [provider]  omeprazole (PRILOSEC) 20 MG capsule Take 20 mg by mouth daily.   Yes [provider]    Family History  No family history on file.  Social History Social History   Tobacco Use   Smoking status: Never Smoker   Smokeless tobacco: Never Used  Substance Use Topics   Alcohol use: No   Drug use: No     Allergies   Patient has no known allergies.   Review of Systems Review of Systems  Constitutional: Negative for chills and fever.  HENT: Negative for rhinorrhea and sore throat.   Eyes: Negative for visual disturbance.  Respiratory: Positive for cough and shortness of breath.   Cardiovascular: Negative for chest pain and leg swelling.  Gastrointestinal: Negative for abdominal pain, diarrhea, nausea and vomiting.  Genitourinary: Negative for dysuria.  Musculoskeletal: Negative for back pain and  neck pain.  Skin: Negative for rash.  Neurological: Negative for dizziness, light-headedness and headaches.  Hematological: Does not bruise/bleed easily.  Psychiatric/Behavioral: Negative for confusion.     Physical Exam Updated Vital Signs BP 106/68 (BP Location: Right Arm)    Pulse (!) 124    Temp 99.3 F (37.4 C) (Oral)    Resp 20    Ht 1.6 m (5\' 3" )    Wt 109.8 kg    SpO2 94%    BMI 42.87 kg/m   Physical Exam Vitals signs and nursing note reviewed.  Constitutional:      General: She is not in acute distress.    Appearance: Normal appearance. She is well-developed.  HENT:     Head: Normocephalic and atraumatic.  Eyes:     Extraocular Movements: Extraocular movements intact.     Conjunctiva/sclera: Conjunctivae normal.     Pupils: Pupils are equal, round, and reactive to light.  Neck:     Musculoskeletal: Normal range of motion and neck supple.  Cardiovascular:     Rate and Rhythm: Normal rate and regular rhythm.     Heart sounds: No murmur.  Pulmonary:     Effort: Respiratory distress present.     Breath sounds: Normal breath sounds.  Abdominal:     Palpations: Abdomen is soft.     Tenderness: There is no abdominal tenderness.  Musculoskeletal:        General: No swelling.  Skin:    General: Skin is warm and dry.     Capillary Refill: Capillary refill takes less than 2 seconds.  Neurological:     General: No focal deficit present.     Mental Status: She is alert and oriented to person, place, and time.      ED Treatments / Results  Labs (all labs ordered are listed, but only abnormal results are displayed) Labs Reviewed  LACTIC ACID, PLASMA - Abnormal; Notable for the following components:      Result Value   Lactic Acid, Venous 2.8 (*)    All other components within normal limits  CBC WITH DIFFERENTIAL/PLATELET - Abnormal; Notable for the following components:   RBC 5.54 (*)    MCH 24.0 (*)    MCHC 29.9 (*)    RDW 24.0 (*)    Platelets 134 (*)    All  other components within normal limits  COMPREHENSIVE METABOLIC PANEL - Abnormal; Notable for the following components:   Glucose, Bld 221 (*)    Calcium 8.7 (*)    Albumin 3.1 (*)    All other components within normal limits  D-DIMER, QUANTITATIVE (NOT AT [facility]) - Abnormal; Notable for the following components:   D-Dimer, Quant 8.26 (*)    All other components within normal limits  LACTATE DEHYDROGENASE -  Abnormal; Notable for the following components:   LDH 265 (*)    All other components within normal limits  FIBRINOGEN - Abnormal; Notable for the following components:   Fibrinogen 532 (*)    All other components within normal limits  CULTURE, BLOOD (ROUTINE X 2)  CULTURE, BLOOD (ROUTINE X 2)  LACTIC ACID, PLASMA  PROCALCITONIN  FERRITIN  TRIGLYCERIDES  C-REACTIVE PROTEIN    EKG EKG Interpretation  Date/Time:  Sunday April 21 2019 17:12:17 EST Ventricular Rate:  123 PR Interval:    QRS Duration: 95 QT Interval:  342 QTC Calculation: 490 R Axis:   82 Text Interpretation: Sinus tachycardia Borderline right axis deviation Low voltage, precordial leads Borderline T abnormalities, anterior leads Borderline prolonged QT interval Confirmed by Vanetta Mulders [phone]) on 04/21/2019 5:22:34 PM   Radiology Dg Chest Port 1 View  Result Date: 04/21/2019 CLINICAL DATA:  68 year old female with shortness of breath. EXAM: PORTABLE CHEST 1 VIEW COMPARISON:  Chest radiograph report dated 02/28/2001. The images are not available for direct comparison. FINDINGS: Minimal bibasilar, left greater than right, densities likely atelectatic changes. Atypical infiltrate is less likely but not excluded. Clinical correlation is recommended. There is no focal consolidation, pleural effusion, pneumothorax. The cardiac silhouette is within normal limits. No acute osseous pathology. IMPRESSION: Minimal bibasilar atelectasis.  No focal consolidation. Electronically Signed   By: Elgie Collard M.D.   On:  04/21/2019 17:45    Procedures Procedures (including critical care time)  CRITICAL CARE Performed by: Vanetta Mulders Total critical care time: 30 minutes Critical care time was exclusive of separately billable procedures and treating other patients. Critical care was necessary to treat or prevent imminent or life-threatening deterioration. Critical care was time spent personally by me on the following activities: development of treatment plan with patient and/or surrogate as well as nursing, discussions with consultants, evaluation of patient's response to treatment, examination of patient, obtaining history from patient or surrogate, ordering and performing treatments and interventions, ordering and review of laboratory studies, ordering and review of radiographic studies, pulse oximetry and re-evaluation of patient's condition.   Medications Ordered in ED Medications - No data to display   Initial Impression / Assessment and Plan / ED Course  I have reviewed the triage vital signs and the nursing notes.  Pertinent labs & imaging results that were available during my care of the patient were reviewed by me and considered in my medical decision making (see chart for details).       Patient with known COVID-19 infection.  Labs here today suggestive of COVID-19 chest x-ray without significant abnormalities with patient has hypoxia patient more comfortable with normal sats on 3 L of oxygen.  Discussed with hospitalist patient has been arranged for admission.  Patient currently reasonably stable but persistent tachycardia and tachypnea.   Final Clinical Impressions(s) / ED Diagnoses   Final diagnoses:  COVID-19 virus infection  Hypoxia    ED Discharge Orders    None       Vanetta Mulders, MD 04/21/19 2022    Vanetta Mulders, MD 04/21/19 [id number]    Vanetta Mulders, MD 04/21/19 2034

## 2019-04-21 NOTE — ED Triage Notes (Addendum)
Patient c/o shortness of breath with productive cough, fever, nausea, vomiting, loss of taste, and diarrhea. Per patient started 2 weeks ago and is progressively getting worse. Patient states she got tested for Covid on 11/30 in which her results were positive. Patient extremely short of breath with exertion. O2 sat 82% on room air.

## 2019-04-21 NOTE — H&P (Signed)
History and Physical    Regina Munoz ZOX:[id number] DOB: [birth year] DOA: 04/21/2019  PCP: Hart RobinsonsButler, Cynthia P, DO   Patient coming from: Home  I [facility] personally briefly [facility]  Chief Complaint: Difficulty breathing, cough, recent positive COVID-19 test  HPI: Regina Munoz is a 68 y.o. female with medical history significant for diabetes mellitus, asthma and anxiety.  Patient presented to the ED with complaints of difficulty breathing, cough.  Also with fever chills nausea vomiting loss of taste and diarrhea.  Symptoms started about 2 weeks ago and have progressed.  Patient spouse and son tested positive also for COVID-19 but they are doing okay.  Patient tested positive for COVID-19 11/30.  ED Course: O2 sats 82% on room air.  Improved with 3.5 L nasal cannula to greater than 93%.  WBC 7.1.  Elevation in inflammatory markers.  Portable chest x-ray-minimal bibasilar left greater than right, densities likely atelectasis changes.  Atypical infiltrate is less likely but not excluded.  Lactic acid elevated at 2.8.  Hospitalist to admit for further evaluation and management.  Review of Systems: As per HPI all other systems reviewed and negative.  Past Medical History:  Diagnosis Date  . Anxiety   . Arthritis   . Asthma   . Complication of anesthesia   . COVID-19   . Diabetes mellitus without complication (HCC)   . Dysrhythmia    palpitations ->20 yrs stress test neg nothing since  . GERD (gastroesophageal reflux disease)   . Headache   . PONV (postoperative nausea and vomiting)     Past Surgical History:  Procedure Laterality Date  . ABDOMINAL HYSTERECTOMY    . ANTERIOR CERVICAL DECOMP/DISCECTOMY FUSION N/A 02/17/2016   Procedure: ANTERIOR CERVICAL DECOMPRESSION/DISCECTOMY FUSION 1 LEVEL C4-5;  Surgeon: Venita Lickahari Brooks, MD;  Location: [facility] OR;  Service: Orthopedics;  Laterality: N/A;  . EYE SURGERY Bilateral    cataracts  . HIP  CLOSED REDUCTION Right 12/24/2012   Procedure: CLOSED REDUCTION HIP;  Surgeon: Emilee HeroMark Leonard Dumonski, MD;  Location: [facility] OR[facility];  Service: Orthopedics;  Laterality: Right;  . JOINT REPLACEMENT  2002   right and left knees  . SHOULDER ARTHROSCOPY Left   . TOTAL HIP ARTHROPLASTY Right    2002     reports that she has never smoked. She has never used smokeless tobacco. She reports that she does not drink alcohol or use drugs.  No Known Allergies  Family hx of hypertension.  Prior to Admission medications   Medication Sig Start Date End Date Taking? Authorizing Provider  acetaminophen (TYLENOL) 500 MG tablet Take 1,000 mg by mouth every 6 (six) hours as needed for mild pain or moderate pain.    Yes [provider]  aspirin EC 81 MG tablet Take 81 mg by mouth daily.   Yes [provider]  buPROPion (ZYBAN) 150 MG 12 hr tablet Take 75 mg by mouth every morning.  03/28/19  Yes [provider]  cetirizine (ZYRTEC) 10 MG tablet Take 10 mg by mouth daily. 02/13/19  Yes [provider]  cholecalciferol (VITAMIN D) 1000 UNITS tablet Take 1,000 Units by mouth daily.   Yes [provider]  diclofenac Sodium (VOLTAREN) 1 % GEL Apply 2 g topically daily as needed (for pain).    Yes [provider]  metFORMIN (GLUCOPHAGE) 500 MG tablet Take 500 mg by mouth 2 (two) times daily with a meal.    Yes [provider]  omeprazole (PRILOSEC)  20 MG capsule Take 20 mg by mouth daily.   Yes [provider]    Physical Exam: Vitals:   04/21/19 1830 04/21/19 1900 04/21/19 1930 04/21/19 2000  BP: 118/66 114/67 112/65 124/69  Pulse: (!) 114 (!) 114 (!) 111 (!) 109  Resp: (!) 30 (!) 31 (!) 30 (!) 26  Temp:      TempSrc:      SpO2: 94% 91% 93% 90%  Weight:      Height:        Constitutional: NAD, calm, comfortable Vitals:   04/21/19 1830 04/21/19 1900 04/21/19 1930 04/21/19 2000  BP: 118/66 114/67 112/65 124/69  Pulse: (!) 114 (!) 114 (!)  111 (!) 109  Resp: (!) 30 (!) 31 (!) 30 (!) 26  Temp:      TempSrc:      SpO2: 94% 91% 93% 90%  Weight:      Height:       Eyes: PERRL, lids and conjunctivae normal ENMT: Mucous membranes are moist. Posterior pharynx clear of any exudate or lesions.Normal dentition.  Neck: normal, supple, no masses, no thyromegaly Respiratory: On 3.5 L nasal cannula, diffuse wheezing, normal respiratory effort. No accessory muscle use.  Cardiovascular: Regular rate and rhythm, no murmurs / rubs / gallops. No extremity edema. 2+ pedal pulses.  Abdomen: no tenderness, no masses palpated. No hepatosplenomegaly. Bowel sounds positive.  Musculoskeletal: no clubbing / cyanosis. No joint deformity upper and lower extremities. Good ROM, no contractures. Normal muscle tone.  Skin: no rashes, lesions, ulcers. No induration Neurologic: CN 2-12 grossly intact. Strength 5/5 in all 4.  Psychiatric: Normal judgment and insight. Alert and oriented x 3. Normal mood.   Labs on Admission: I have personally reviewed following labs and imaging studies  CBC: Recent Labs  Lab 04/21/19 1722  WBC 7.1  NEUTROABS 5.1  HGB 13.3  HCT 44.5  MCV 80.3  PLT 134*   Basic Metabolic Panel: Recent Labs  Lab 04/21/19 1722  NA 138  K 3.8  CL 99  CO2 24  GLUCOSE 221*  BUN 12  CREATININE 0.84  CALCIUM 8.7*   Liver Function Tests: Recent Labs  Lab 04/21/19 1722  AST 25  ALT 17  ALKPHOS 84  BILITOT 1.2  PROT 7.5  ALBUMIN 3.1*   Lipid Profile: Recent Labs    04/21/19 1722  TRIG 123   Anemia Panel: Recent Labs    04/21/19 1722  FERRITIN 40    Radiological Exams on Admission: Dg Chest Port 1 View  Result Date: 04/21/2019 CLINICAL DATA:  68 year old female with shortness of breath. EXAM: PORTABLE CHEST 1 VIEW COMPARISON:  Chest radiograph report dated 02/28/2001. The images are not available for direct comparison. FINDINGS: Minimal bibasilar, left greater than right, densities likely atelectatic changes.  Atypical infiltrate is less likely but not excluded. Clinical correlation is recommended. There is no focal consolidation, pleural effusion, pneumothorax. The cardiac silhouette is within normal limits. No acute osseous pathology. IMPRESSION: Minimal bibasilar atelectasis.  No focal consolidation. Electronically Signed   By: Elgie Collard M.D.   On: 04/21/2019 17:45    EKG: Independently reviewed.  Sinus tachycardia.  Nonspecific T wave changes in V3 and V4.   Assessment/Plan Active Problems:   Pneumonia due to COVID-19 virus  COVID-19 pneumonia- tachycardic and tachypneic.  O2 sats currently on 3.5 L nasal cannula.,  Sats greater than 93%.  Densities left greater than right bases, likely atelectasis changes, atypical infiltrate is less likely but not excluded.  Elevation  in inflammatory markers, D-dimer 8.26, LDH 265.  Fibrinogen 532.  CRP 10.8.  Procalcitonin less than 0.1. - Lactic acidosis 2.8 > 1.6, likely secondary to infection, poor p.o. intake and diarrhea.  Improved with bolus fluids- 1L.  -Initiate remdesivir, pharmacy to dose -IV dexamethasone 6 mg daily -Mucolytic's, supplemental O2 -As needed albuterol inhaler -Respiratory protocol -COVID-19 admission protocol -Daily CMP, CBC and inflammatory markers  Asthma-exacerbated by COVID-19 pneumonia. -As needed and scheduled albuterol inhaler -IV dexamethasone -Supplemental O2, mucolytics  Diabetes mellitus-random glucose 221 -Hemoglobin A1c - SSI -Hold home Metformin  DVT prophylaxis: Lovenox Code Status: Full code Family Communication: Patient's son was at bedside, I explained to him why he should not be in the hospital, and to call the hospital to talk to his mom.  He voiced understanding. Disposition Plan: Per rounding team Consults called: None Admission status: Inpatient, telemetry I certify that at the point of admission it is my clinical judgment that the patient will require inpatient hospital care spanning beyond 2  midnights from the point of admission due to high intensity of service, high risk for further deterioration and high frequency of surveillance required. The following factors support the patient status of inpatient: COVID-19 pneumonia requiring IV antivirals and steroids and his inpatient admission.   Bethena Roys MD [facility]  04/21/2019, 10:01 PM

## 2019-04-21 NOTE — ED Notes (Signed)
Called [facility] for remdesivir 

## 2019-04-21 NOTE — ED Notes (Signed)
Patient started on mdi inhaler , low sats no wheezes noted.

## 2019-04-21 NOTE — ED Notes (Signed)
[phone] number of spouse who would like to speak with provider re wife

## 2019-04-22 ENCOUNTER — Encounter (HOSPITAL_COMMUNITY): Payer: Self-pay

## 2019-04-22 LAB — GLUCOSE, CAPILLARY
Glucose-Capillary: 261 mg/dL — ABNORMAL HIGH (ref 70–99)
Glucose-Capillary: 262 mg/dL — ABNORMAL HIGH (ref 70–99)
Glucose-Capillary: 169 mg/dL — ABNORMAL HIGH (ref 70–99)

## 2019-04-22 LAB — HEMOGLOBIN A1C
Hgb A1c MFr Bld: 7.8 % — ABNORMAL HIGH (ref 4.8–5.6)
Mean Plasma Glucose: 177.16 mg/dL

## 2019-04-22 LAB — ABO/RH: ABO/RH(D): O POS

## 2019-04-22 LAB — HIV ANTIBODY (ROUTINE TESTING W REFLEX): HIV Screen 4th Generation wRfx: NONREACTIVE

## 2019-04-22 MED ORDER — IPRATROPIUM BROMIDE HFA 17 MCG/ACT IN AERS
2.0000 | INHALATION_SPRAY | Freq: Three times a day (TID) | RESPIRATORY_TRACT | Status: DC
  Administered 2019-04-23 – 2019-04-28 (×13): 2 via RESPIRATORY_TRACT
  Filled 2019-04-22: qty 12.9

## 2019-04-22 MED ORDER — VITAMIN D 25 MCG (1000 UNIT) PO TABS
1000.0000 [IU] | ORAL_TABLET | Freq: Every day | ORAL | Status: DC
  Administered 2019-04-22 – 2019-05-02 (×9): 1000 [IU] via ORAL
  Filled 2019-04-22 (×9): qty 1

## 2019-04-22 MED ORDER — IPRATROPIUM-ALBUTEROL 20-100 MCG/ACT IN AERS
1.0000 | INHALATION_SPRAY | Freq: Four times a day (QID) | RESPIRATORY_TRACT | Status: AC
  Administered 2019-04-22 – 2019-04-23 (×5): 1 via RESPIRATORY_TRACT
  Filled 2019-04-22: qty 4

## 2019-04-22 MED ORDER — ASPIRIN EC 81 MG PO TBEC
81.0000 mg | DELAYED_RELEASE_TABLET | Freq: Every day | ORAL | Status: DC
  Administered 2019-04-22 – 2019-04-27 (×6): 81 mg via ORAL
  Filled 2019-04-22 (×6): qty 1

## 2019-04-22 MED ORDER — INSULIN ASPART 100 UNIT/ML ~~LOC~~ SOLN
0.0000 [IU] | Freq: Every day | SUBCUTANEOUS | Status: DC
  Administered 2019-04-25: 2 [IU] via SUBCUTANEOUS
  Administered 2019-04-26: 22:00:00 3 [IU] via SUBCUTANEOUS

## 2019-04-22 MED ORDER — ALBUTEROL SULFATE HFA 108 (90 BASE) MCG/ACT IN AERS
2.0000 | INHALATION_SPRAY | Freq: Three times a day (TID) | RESPIRATORY_TRACT | Status: DC
  Administered 2019-04-22 – 2019-04-28 (×18): 2 via RESPIRATORY_TRACT
  Filled 2019-04-22 (×2): qty 6.7

## 2019-04-22 MED ORDER — BUPROPION HCL ER (SMOKING DET) 150 MG PO TB12: 75.0000 mg | ORAL_TABLET | Freq: Every morning | ORAL | Status: DC

## 2019-04-22 MED ORDER — ONDANSETRON HCL 4 MG PO TABS: 4.0000 mg | ORAL_TABLET | Freq: Four times a day (QID) | ORAL | Status: DC | PRN

## 2019-04-22 MED ORDER — INSULIN ASPART 100 UNIT/ML ~~LOC~~ SOLN
0.0000 [IU] | Freq: Three times a day (TID) | SUBCUTANEOUS | Status: DC
  Administered 2019-04-22 (×2): 8 [IU] via SUBCUTANEOUS
  Administered 2019-04-23: 5 [IU] via SUBCUTANEOUS
  Administered 2019-04-23: 8 [IU] via SUBCUTANEOUS
  Administered 2019-04-23: 3 [IU] via SUBCUTANEOUS
  Administered 2019-04-24: 13:00:00 5 [IU] via SUBCUTANEOUS
  Administered 2019-04-24: 8 [IU] via SUBCUTANEOUS
  Administered 2019-04-24 – 2019-04-25 (×2): 5 [IU] via SUBCUTANEOUS
  Administered 2019-04-25: 2 [IU] via SUBCUTANEOUS
  Administered 2019-04-25 – 2019-04-26 (×2): 3 [IU] via SUBCUTANEOUS
  Administered 2019-04-26 – 2019-04-27 (×2): 5 [IU] via SUBCUTANEOUS
  Administered 2019-04-27: 08:00:00 8 [IU] via SUBCUTANEOUS
  Administered 2019-04-27: 5 [IU] via SUBCUTANEOUS
  Administered 2019-04-28 (×2): 2 [IU] via SUBCUTANEOUS
  Administered 2019-04-28: 3 [IU] via SUBCUTANEOUS
  Administered 2019-04-29: 09:00:00 5 [IU] via SUBCUTANEOUS

## 2019-04-22 MED ORDER — IPRATROPIUM-ALBUTEROL 0.5-2.5 (3) MG/3ML IN SOLN: 3.0000 mL | Freq: Four times a day (QID) | RESPIRATORY_TRACT | Status: DC

## 2019-04-22 MED ORDER — ZINC SULFATE 220 (50 ZN) MG PO CAPS
220.0000 mg | ORAL_CAPSULE | Freq: Every day | ORAL | Status: DC
  Administered 2019-04-22 – 2019-05-02 (×9): 220 mg via ORAL
  Filled 2019-04-22 (×9): qty 1

## 2019-04-22 MED ORDER — VITAMIN C 500 MG PO TABS: 500.0000 mg | ORAL_TABLET | Freq: Every day | ORAL | Status: DC

## 2019-04-22 MED ORDER — IPRATROPIUM BROMIDE HFA 17 MCG/ACT IN AERS
2.0000 | INHALATION_SPRAY | Freq: Three times a day (TID) | RESPIRATORY_TRACT | Status: DC
  Administered 2019-04-22: 2 via RESPIRATORY_TRACT
  Filled 2019-04-22: qty 12.9

## 2019-04-22 MED ORDER — ONDANSETRON HCL 4 MG/2ML IJ SOLN: 4.0000 mg | Freq: Four times a day (QID) | INTRAMUSCULAR | Status: DC | PRN

## 2019-04-22 MED ORDER — ZINC SULFATE 220 (50 ZN) MG PO CAPS: 220.0000 mg | ORAL_CAPSULE | Freq: Every day | ORAL | Status: DC

## 2019-04-22 MED ORDER — BUPROPION HCL ER (SR) 150 MG PO TB12: 150.0000 mg | ORAL_TABLET | Freq: Every day | ORAL | Status: DC

## 2019-04-22 MED ORDER — PANTOPRAZOLE SODIUM 40 MG PO TBEC
40.0000 mg | DELAYED_RELEASE_TABLET | Freq: Every day | ORAL | Status: DC
  Administered 2019-04-22 – 2019-05-02 (×9): 40 mg via ORAL
  Filled 2019-04-22 (×9): qty 1

## 2019-04-22 MED ORDER — ENOXAPARIN SODIUM 60 MG/0.6ML ~~LOC~~ SOLN
55.0000 mg | SUBCUTANEOUS | Status: DC
  Administered 2019-04-22 – 2019-04-24 (×3): 55 mg via SUBCUTANEOUS
  Filled 2019-04-22 (×3): qty 0.6

## 2019-04-22 MED ORDER — DM-GUAIFENESIN ER 30-600 MG PO TB12
1.0000 | ORAL_TABLET | Freq: Two times a day (BID) | ORAL | Status: DC
  Administered 2019-04-22 – 2019-05-02 (×19): 1 via ORAL
  Filled 2019-04-22 (×22): qty 1

## 2019-04-22 MED ORDER — ASCORBIC ACID 500 MG PO TABS
500.0000 mg | ORAL_TABLET | Freq: Every day | ORAL | Status: DC
  Administered 2019-04-22 – 2019-05-02 (×9): 500 mg via ORAL
  Filled 2019-04-22 (×11): qty 1

## 2019-04-22 MED ORDER — BUPROPION HCL ER (SR) 100 MG PO TB12
100.0000 mg | ORAL_TABLET | Freq: Every day | ORAL | Status: DC
  Administered 2019-04-22 – 2019-05-02 (×9): 100 mg via ORAL
  Filled 2019-04-22 (×11): qty 1

## 2019-04-22 MED ORDER — ENSURE ENLIVE PO LIQD
237.0000 mL | Freq: Two times a day (BID) | ORAL | Status: DC
  Administered 2019-04-22 – 2019-04-25 (×5): 237 mL via ORAL

## 2019-04-22 MED ORDER — ACETAMINOPHEN 325 MG PO TABS
650.0000 mg | ORAL_TABLET | Freq: Four times a day (QID) | ORAL | Status: DC | PRN
  Administered 2019-04-27: 09:00:00 650 mg via ORAL
  Filled 2019-04-22: qty 2

## 2019-04-22 MED ORDER — POLYETHYLENE GLYCOL 3350 17 G PO PACK: 17.0000 g | PACK | Freq: Every day | ORAL | Status: DC | PRN

## 2019-04-22 NOTE — Progress Notes (Addendum)
PROGRESS NOTE  Regina Munoz PVV:[id number] DOB: 02-19-1951 DOA: 04/21/2019 PCP: [name], DO  HPI/Recap of past 60 hours: 68 year old female with past medical history significant for type 2 diabetes, asthma and anxiety who presented to the ED from home due to difficulty breathing and cough.  Associated with fever chills nausea vomiting loss of taste and diarrhea.  Symptoms started 2 weeks ago and have progressed.  Patient spouse and son tested positive for COVID-19.  Patient tested positive for COVID-19 on 04/15/2019.  Hypoxic on presentation with O2 saturation in the 80s on room air.  Chest x-ray showed patchy infiltrates worse at bases.  Started on remdesivir and Decadron.  Admitted for acute COVID-19 viral pneumonia.  04/22/19: Seen and examined.  Reports breathing has improved but still dyspneic on exertion.  Poor appetite and oral intake.  Assessment/Plan: Active Problems:   Pneumonia due to COVID-19 virus   Acute COVID-19 viral pneumonia Hypoxic with O2 saturation in the 80s on room air Covid -19 positive on 04/15/19 O2 saturation improved on 3.5 L by nasal cannula Independently viewed chest x-ray done on admission which shows patchy infiltrates bilaterally worse at bases. Started on remdesivir, to complete 5 days and IV Decadron 6 mg daily to complete 10 days. Started Xopenex and ipratropium inhalers 3 times daily Start vitamin C, D3 and zinc Start incentive spirometer Maintain O2 saturation greater than 92%  Acute hypoxic respiratory failure secondary to acute COVID-19 viral pneumonia Management as per above  Type 2 diabetes with hyperglycemia Hemoglobin A1c 7.8 Continue insulin coverage  Chronic anxiety/depression Continue home medication  GERD Continue home medication  DVT prophylaxis: Lovenox Code Status: Full code Family Communication:  None at bedside.  Disposition Plan:  Transfer to [facility] to continue care.   Consults called: None   Objective:  Vitals:   04/22/19 0100 04/22/19 0115 04/22/19 0604 04/22/19 0731  BP: 128/72  106/67   Pulse: (!) 108 (!) 103 88 87  Resp: (!) 23 (!) 24 (!) 26 20  Temp:   98 F (36.7 C)   TempSrc:   Oral   SpO2: 93% 93% 90% 92%  Weight:   107 kg   Height:        Intake/Output Summary (Last 24 hours) at 04/22/2019 1135 Last data filed at 04/22/2019 0600 Gross per 24 hour  Intake 200 ml  Output -  Net 200 ml   Filed Weights   04/21/19 1715 04/22/19 0604  Weight: 109.8 kg 107 kg    Exam:  . General: 68 y.o. year-old female well developed well nourished in no acute distress.  Alert and oriented x3. . Cardiovascular: Regular rate and rhythm with no rubs or gallops.  No thyromegaly or JVD noted.   Marland Kitchen Respiratory: Mild rales noted at bases.  No wheezing noted.  Good inspiratory effort. . Abdomen: Obese nontender with normal bowel sounds x4 quadrants. . Musculoskeletal: Trace lower extremity edema. 2/4 pulses in all 4 extremities. Marland Kitchen Psychiatry: Mood is appropriate for condition and setting   Data Reviewed: CBC: Recent Labs  Lab 04/21/19 1722  WBC 7.1  NEUTROABS 5.1  HGB 13.3  HCT 44.5  MCV 80.3  PLT 134*   Basic Metabolic Panel: Recent Labs  Lab 04/21/19 1722  NA 138  K 3.8  CL 99  CO2 24  GLUCOSE 221*  BUN 12  CREATININE 0.84  CALCIUM 8.7*   GFR: Estimated Creatinine Clearance: 76.1 mL/min (by C-G formula based on SCr of 0.84 mg/dL). Liver Function Tests:  Recent Labs  Lab 04/21/19 1722  AST 25  ALT 17  ALKPHOS 84  BILITOT 1.2  PROT 7.5  ALBUMIN 3.1*   No results for input(s): LIPASE, AMYLASE in the last 168 hours. No results for input(s): AMMONIA in the last 168 hours. Coagulation Profile: No results for input(s): INR, PROTIME in the last 168 hours. Cardiac Enzymes: No results for input(s): CKTOTAL, CKMB, CKMBINDEX, TROPONINI in the last 168 hours. BNP (last 3 results) No results for input(s): PROBNP in the last 8760 hours. HbA1C: Recent Labs    04/22/19  1100  HGBA1C 7.8*   CBG: No results for input(s): GLUCAP in the last 168 hours. Lipid Profile: Recent Labs    04/21/19 1722  TRIG 123   Thyroid Function Tests: No results for input(s): TSH, T4TOTAL, FREET4, T3FREE, THYROIDAB in the last 72 hours. Anemia Panel: Recent Labs    04/21/19 1722  FERRITIN 40   Urine analysis: No results found for: COLORURINE, APPEARANCEUR, LABSPEC, PHURINE, GLUCOSEU, HGBUR, BILIRUBINUR, KETONESUR, PROTEINUR, UROBILINOGEN, NITRITE, LEUKOCYTESUR Sepsis Labs: @LABRCNTIP (procalcitonin:4,lacticidven:4)  ) Recent Results (from the past 240 hour(s))  Novel Coronavirus, NAA ([facility])     Status: Abnormal   Collection Time: 04/15/19  8:50 AM   Specimen: Nasopharyngeal(NP) swabs in vial transport medium   NASOPHARYNGE  TESTING  Result Value Ref Range Status   SARS-CoV-2, NAA Detected (A) Not Detected Final    Comment: This nucleic acid amplification test was developed and its performance characteristics determined by [facility]. Nucleic acid amplification tests include PCR and TMA. This test has not been FDA cleared or approved. This test has been authorized by FDA under an Emergency Use Authorization (EUA). This test is only authorized for the duration of time the declaration that circumstances exist justifying the authorization of the emergency use of in vitro diagnostic tests for detection of SARS-CoV-2 virus and/or diagnosis of COVID-19 infection under section 564(b)(1) of the Act, 21 U.S.C. 161WRU-0(A360bbb-3(b) (1), unless the authorization is terminated or revoked sooner. When diagnostic testing is negative, the possibility of a false negative result should be considered in the context of a patient's recent exposures and the presence of clinical signs and symptoms consistent with COVID-19. An individual without symptoms of COVID-19 and who is not shedding SARS-CoV-2 virus would  expect to have a negative (not detected) result in this assay.    Blood Culture (routine x 2)     Status: None (Preliminary result)   Collection Time: 04/21/19  5:38 PM   Specimen: BLOOD LEFT ARM  Result Value Ref Range Status   Specimen Description BLOOD LEFT ARM  Final   Special Requests   Final    BOTTLES DRAWN AEROBIC AND ANAEROBIC Blood Culture adequate volume   Culture   Final    NO GROWTH < 24 HOURS Performed at [facility], [address] [place], [place] [address]    Report Status PENDING  Incomplete  Blood Culture (routine x 2)     Status: None (Preliminary result)   Collection Time: 04/21/19  5:38 PM   Specimen: Right Antecubital; Blood  Result Value Ref Range Status   Specimen Description RIGHT ANTECUBITAL  Final   Special Requests   Final    BOTTLES DRAWN AEROBIC AND ANAEROBIC Blood Culture adequate volume   Culture   Final    NO GROWTH < 24 HOURS Performed at [facility], [address] [place], [place] [address]    Report Status PENDING  Incomplete      Studies: Dg Chest  Port 1 View  Result Date: 04/21/2019 CLINICAL DATA:  68 year old female with shortness of breath. EXAM: PORTABLE CHEST 1 VIEW COMPARISON:  Chest radiograph report dated 02/28/2001. The images are not available for direct comparison. FINDINGS: Minimal bibasilar, left greater than right, densities likely atelectatic changes. Atypical infiltrate is less likely but not excluded. Clinical correlation is recommended. There is no focal consolidation, pleural effusion, pneumothorax. The cardiac silhouette is within normal limits. No acute osseous pathology. IMPRESSION: Minimal bibasilar atelectasis.  No focal consolidation. Electronically Signed   By: Anner Crete M.D.   On: 04/21/2019 17:45    Scheduled Meds: . albuterol  2 puff Inhalation Q8H  . cholecalciferol  1,000 Units Oral Daily  . dexamethasone (DECADRON) injection  6 mg Intravenous Q24H  . dextromethorphan-guaiFENesin  1 tablet Oral BID  . enoxaparin (LOVENOX) injection  55 mg Subcutaneous Q24H  .  feeding supplement (ENSURE ENLIVE)  237 mL Oral BID BM  . insulin aspart  0-15 Units Subcutaneous TID WC  . insulin aspart  0-5 Units Subcutaneous QHS  . [START ON 04/23/2019] ipratropium  2 puff Inhalation Q8H  . Ipratropium-Albuterol  1 puff Inhalation Q6H  . vitamin C  500 mg Oral Daily  . zinc sulfate  220 mg Oral Daily    Continuous Infusions: . remdesivir 100 mg in NS 100 mL       LOS: 1 day     Kayleen Memos, MD [facility] Pager [phone]  If 7PM-7AM, please contact night-coverage [email or web address] Password [email or web address] 04/22/2019, 11:35 AM

## 2019-04-22 NOTE — Progress Notes (Signed)
Updated Pt's son Herbie Baltimore over the phone. Answered all questions/concerns. Will update with any changes.

## 2019-04-22 NOTE — Progress Notes (Signed)
Initial Nutrition Assessment  RD working remotely.  DOCUMENTATION CODES:   Morbid obesity  INTERVENTION:   - Continue Ensure Enlive po BID, each supplement provides 350 kcal and 20 grams of protein  - Encourage adequate PO intake  NUTRITION DIAGNOSIS:   Inadequate oral intake related to decreased appetite as evidenced by per patient/family report.  GOAL:   Patient will meet greater than or equal to 90% of their needs  MONITOR:   PO intake, Supplement acceptance, Labs, Weight trends  REASON FOR ASSESSMENT:   Malnutrition Screening Tool    ASSESSMENT:   68 year old female who presented to the ED on 12/06 with SOB. Pt with known COVID-19 infection. PMH of DM, asthma, anxiety. Pt admitted with COVID-19 pneumonia.   Spoke with pt via phone call to room. Pt reports having a decrease in appetite and experiencing taste changes over the last 2 weeks. Pt states, "I thought I had the flu." Pt reports that because of this decrease in appetite, she has not really wanted to eat.  Pt shares that most meats that she has been eating are "burning my mouth." Pt prefers softer foods like pancakes and creamed potatoes. Encouraged pt to have her room phone nearby so that she can order meals she enjoys when [facility] calls to take her meal orders. Pt expresses understanding.  Pt states that this morning for breakfast, she had about 25% of the items on her meal tray. Pt reports she ate 1 pancake with syrup and drank the milk.  Pt reports that her UBW is 242 lbs and that she last weighed this 2 weeks ago. Current weight is 235 lbs. RD will monitor weight trends during admission.  Pt willing to consume Ensure Enlive during admission to aid in meeting kcal and protein needs. Pt prefers chocolate flavor.  No meal completions recorded.  Medications reviewed and include: cholecalciferol, Decadron, Ensure Enlive BID, SSI, vitamin C, zinc sulfate, Remdesivir  Labs reviewed.  NUTRITION - FOCUSED PHYSICAL  EXAM:  Unable to complete at this time. RD working remotely.  Diet Order:   Diet Order            Diet heart healthy/carb modified Room service appropriate? Yes; Fluid consistency: Thin  Diet effective now              EDUCATION NEEDS:   Education needs have been addressed  Skin:  Skin Assessment: Reviewed RN Assessment (MASD right buttocks)  Last BM:  no documented BM  Height:   Ht Readings from Last 1 Encounters:  04/21/19 5\' 3"  (1.6 m)    Weight:   Wt Readings from Last 1 Encounters:  04/22/19 107 kg    Ideal Body Weight:  52.3 kg  BMI:  Body mass index is 41.79 kg/m.  Estimated Nutritional Needs:   Kcal:  1900-2100  Protein:  95-110 grams  Fluid:  >/= 1.8 L    Gaynell Face, MS, RD, LDN Inpatient Clinical Dietitian Pager: [phone] Weekend/After Hours: [phone]

## 2019-04-22 NOTE — ED Notes (Signed)
ED TO INPATIENT HANDOFF REPORT  ED Nurse Name and Phone #: Louanne Belton, RN [phone]  S Name/Age/Gender Regina Munoz 68 y.o. female Room/Bed: APOTF/OTF  Code Status   Code Status: Prior  Home/SNF/Other Home Patient oriented to: self, place, time and situation Is this baseline? Yes   Triage Complete: Triage complete  Chief Complaint Sob Covid+  Triage Note Patient c/o shortness of breath with productive cough, fever, nausea, vomiting, loss of taste, and diarrhea. Per patient started 2 weeks ago and is progressively getting worse. Patient states she got tested for Covid on 11/30 in which her results were positive. Patient extremely short of breath with exertion. O2 sat 82% on room air.   Allergies No Known Allergies  Level of Care/Admitting Diagnosis ED Disposition    ED Disposition Condition Comment   Admit  Hospital Area: [facility] [[id number]]  Level of Care: Telemetry Medical [104]  Covid Evaluation: Confirmed COVID Positive  Diagnosis: Pneumonia due to COVID-19 virus [[id number]]  Admitting Physician: Onnie Boer [[id number]]  Attending Physician: Onnie Boer [phone]  Estimated length of stay: past midnight tomorrow  Certification:: I certify this patient will need inpatient services for at least 2 midnights  PT Class (Do Not Modify): Inpatient [101]  PT Acc Code (Do Not Modify): Private [1]       B Medical/Surgery History Past Medical History:  Diagnosis Date  . Anxiety   . Arthritis   . Asthma   . Complication of anesthesia   . COVID-19   . Diabetes mellitus without complication (HCC)   . Dysrhythmia    palpitations ->20 yrs stress test neg nothing since  . GERD (gastroesophageal reflux disease)   . Headache   . PONV (postoperative nausea and vomiting)    Past Surgical History:  Procedure Laterality Date  . ABDOMINAL HYSTERECTOMY    . ANTERIOR CERVICAL DECOMP/DISCECTOMY FUSION N/A 02/17/2016   Procedure: ANTERIOR  CERVICAL DECOMPRESSION/DISCECTOMY FUSION 1 LEVEL C4-5;  Surgeon: Venita Lick, MD;  Location: [facility] OR;  Service: Orthopedics;  Laterality: N/A;  . EYE SURGERY Bilateral    cataracts  . HIP CLOSED REDUCTION Right 12/24/2012   Procedure: CLOSED REDUCTION HIP;  Surgeon: Emilee Hero, MD;  Location: [facility] ORS;  Service: Orthopedics;  Laterality: Right;  . JOINT REPLACEMENT  2002   right and left knees  . SHOULDER ARTHROSCOPY Left   . TOTAL HIP ARTHROPLASTY Right    2002     A IV Location/Drains/Wounds Patient Lines/Drains/Airways Status   Active Line/Drains/Airways    Name:   Placement date:   Placement time:   Site:   Days:   Peripheral IV 04/21/19 Left Hand   04/21/19    1723    Hand   1   Peripheral IV 04/21/19 Lateral;Left Forearm   04/21/19    1736    Forearm   1   Incision 12/24/12 Leg Right   12/24/12    2231     2310   Incision (Closed) 02/17/16 Neck Other (Comment)   02/17/16    0931     1160          Intake/Output Last 24 hours No intake or output data in the 24 hours ending 04/22/19 0136  Labs/Imaging Results for orders placed or performed during the hospital encounter of 04/21/19 (from the past 48 hour(s))  Lactic acid, plasma     Status: Abnormal   Collection Time: 04/21/19  5:22 PM  Result Value Ref Range   Lactic  Acid, Venous 2.8 (HH) 0.5 - 1.9 mmol/L    Comment: CRITICAL RESULT CALLED TO, READ BACK BY AND VERIFIED WITH: CARDWELL,L @ 1830 ON 04/21/19 BY JUW Performed at [facility], [address] [place], [place] [address]   CBC WITH DIFFERENTIAL     Status: Abnormal   Collection Time: 04/21/19  5:22 PM  Result Value Ref Range   WBC 7.1 4.0 - 10.5 K/uL   RBC 5.54 (H) 3.87 - 5.11 MIL/uL   Hemoglobin 13.3 12.0 - 15.0 g/dL   HCT 44.5 36.0 - 46.0 %   MCV 80.3 80.0 - 100.0 fL   MCH 24.0 (L) 26.0 - 34.0 pg   MCHC 29.9 (L) 30.0 - 36.0 g/dL   RDW 24.0 (H) 11.5 - 15.5 %   Platelets 134 (L) 150 - 400 K/uL   nRBC 0.0 0.0 - 0.2 %   Neutrophils Relative % 72 %    Neutro Abs 5.1 1.7 - 7.7 K/uL   Lymphocytes Relative 17 %   Lymphs Abs 1.2 0.7 - 4.0 K/uL   Monocytes Relative 8 %   Monocytes Absolute 0.6 0.1 - 1.0 K/uL   Eosinophils Relative 1 %   Eosinophils Absolute 0.1 0.0 - 0.5 K/uL   Basophils Relative 1 %   Basophils Absolute 0.0 0.0 - 0.1 K/uL   Immature Granulocytes 1 %   Abs Immature Granulocytes 0.07 0.00 - 0.07 K/uL    Comment: Performed at [facility], [address], [place], [place] [address]  Comprehensive metabolic panel     Status: Abnormal   Collection Time: 04/21/19  5:22 PM  Result Value Ref Range   Sodium 138 135 - 145 mmol/L   Potassium 3.8 3.5 - 5.1 mmol/L   Chloride 99 98 - 111 mmol/L   CO2 24 22 - 32 mmol/L   Glucose, Bld 221 (H) 70 - 99 mg/dL   BUN 12 8 - 23 mg/dL   Creatinine, Ser 0.84 0.44 - 1.00 mg/dL   Calcium 8.7 (L) 8.9 - 10.3 mg/dL   Total Protein 7.5 6.5 - 8.1 g/dL   Albumin 3.1 (L) 3.5 - 5.0 g/dL   AST 25 15 - 41 U/L   ALT 17 0 - 44 U/L   Alkaline Phosphatase 84 38 - 126 U/L   Total Bilirubin 1.2 0.3 - 1.2 mg/dL   GFR calc non Af Amer >60 >60 mL/min   GFR calc Af Amer >60 >60 mL/min   Anion gap 15 5 - 15    Comment: Performed at [facility], [address] [place], [place] [address]  D-dimer, quantitative     Status: Abnormal   Collection Time: 04/21/19  5:22 PM  Result Value Ref Range   D-Dimer, Quant 8.26 (H) 0.00 - 0.50 ug/mL-FEU    Comment: (NOTE) At the manufacturer cut-off of 0.50 ug/mL FEU, this assay has been documented to exclude PE with a sensitivity and negative predictive value of 97 to 99%.  At this time, this assay has not been approved by the FDA to exclude DVT/VTE. Results should be correlated with clinical presentation. Performed at [facility], [address], [place], [place] [address]   Procalcitonin     Status: None   Collection Time: 04/21/19  5:22 PM  Result Value Ref Range   Procalcitonin <0.10 ng/mL    Comment:        Interpretation: PCT (Procalcitonin) <= 0.5  ng/mL: Systemic infection (sepsis) is not likely. Local bacterial infection is possible. (NOTE)  Sepsis PCT Algorithm           Lower Respiratory Tract                                      Infection PCT Algorithm    ----------------------------     ----------------------------         PCT < 0.25 ng/mL                PCT < 0.10 ng/mL         Strongly encourage             Strongly discourage   discontinuation of antibiotics    initiation of antibiotics    ----------------------------     -----------------------------       PCT 0.25 - 0.50 ng/mL            PCT 0.10 - 0.25 ng/mL               OR       >80% decrease in PCT            Discourage initiation of                                            antibiotics      Encourage discontinuation           of antibiotics    ----------------------------     -----------------------------         PCT >= 0.50 ng/mL              PCT 0.26 - 0.50 ng/mL               AND        <80% decrease in PCT             Encourage initiation of                                             antibiotics       Encourage continuation           of antibiotics    ----------------------------     -----------------------------        PCT >= 0.50 ng/mL                  PCT > 0.50 ng/mL               AND         increase in PCT                  Strongly encourage                                      initiation of antibiotics    Strongly encourage escalation           of antibiotics                                     -----------------------------  PCT <= 0.25 ng/mL                                                 OR                                        > 80% decrease in PCT                                     Discontinue / Do not initiate                                             antibiotics Performed at [facility], [address] [place], [place] [address]   Lactate dehydrogenase     Status: Abnormal   Collection  Time: 04/21/19  5:22 PM  Result Value Ref Range   LDH 265 (H) 98 - 192 U/L    Comment: Performed at [facility], [address] [place], [place] [address]  Ferritin     Status: None   Collection Time: 04/21/19  5:22 PM  Result Value Ref Range   Ferritin 40 11 - 307 ng/mL    Comment: Performed at [facility], [address] [place], [place] [address]  Triglycerides     Status: None   Collection Time: 04/21/19  5:22 PM  Result Value Ref Range   Triglycerides 123 <150 mg/dL    Comment: Performed at [facility], [address] [place], [place] [address]  Fibrinogen     Status: Abnormal   Collection Time: 04/21/19  5:22 PM  Result Value Ref Range   Fibrinogen 532 (H) 210 - 475 mg/dL    Comment: Performed at [facility], [address] [place], [place] [address]  C-reactive protein     Status: Abnormal   Collection Time: 04/21/19  5:22 PM  Result Value Ref Range   CRP 10.8 (H) <1.0 mg/dL    Comment: Performed at [facility], [address] [place], [place] [address]  Blood Culture (routine x 2)     Status: None (Preliminary result)   Collection Time: 04/21/19  5:38 PM   Specimen: BLOOD LEFT ARM  Result Value Ref Range   Specimen Description BLOOD LEFT ARM    Special Requests      BOTTLES DRAWN AEROBIC AND ANAEROBIC Blood Culture adequate volume Performed at [facility], [address] [place], [place] [address]    Culture PENDING    Report Status PENDING   Blood Culture (routine x 2)     Status: None (Preliminary result)   Collection Time: 04/21/19  5:38 PM   Specimen: Right Antecubital; Blood  Result Value Ref Range   Specimen Description RIGHT ANTECUBITAL    Special Requests      BOTTLES DRAWN AEROBIC AND ANAEROBIC Blood Culture adequate volume Performed at [facility], [address] [place], [place] [address]    Culture PENDING    Report Status PENDING   Lactic acid, plasma     Status: None   Collection Time: 04/21/19  8:58 PM  Result  Value Ref Range   Lactic  Acid, Venous 1.6 0.5 - 1.9 mmol/L    Comment: Performed at [facility], [address], [place], [place] [address]   Dg Chest Port 1 View  Result Date: 04/21/2019 CLINICAL DATA:  68 year old female with shortness of breath. EXAM: PORTABLE CHEST 1 VIEW COMPARISON:  Chest radiograph report dated 02/28/2001. The images are not available for direct comparison. FINDINGS: Minimal bibasilar, left greater than right, densities likely atelectatic changes. Atypical infiltrate is less likely but not excluded. Clinical correlation is recommended. There is no focal consolidation, pleural effusion, pneumothorax. The cardiac silhouette is within normal limits. No acute osseous pathology. IMPRESSION: Minimal bibasilar atelectasis.  No focal consolidation. Electronically Signed   By: Elgie Collard M.D.   On: 04/21/2019 17:45    Pending Labs [employer] (From admission, onward)    Start     Ordered   Signed and Held  HIV Antibody (routine testing w rflx)  (HIV Antibody (Routine testing w reflex) panel)  Once,   R     Signed and Held   Signed and Held  ABO/Rh  Once,   R     Signed and Held   Signed and Held  C-reactive protein  Daily,   R     Signed and Held   Signed and Held  CBC with Differential/Platelet  Daily,   R     Signed and Held   Signed and Held  Comprehensive metabolic panel  Daily,   R     Signed and Held   Signed and Held  D-dimer, quantitative (not at [facility])  Daily,   R     Signed and Held   Signed and Held  Ferritin  Daily,   R     Signed and Held   Signed and Held  Hemoglobin A1c  Once,   R    Comments: To assess prior glycemic control    Signed and Held          Vitals/Pain Today's Vitals   04/22/19 0000 04/22/19 0030 04/22/19 0100 04/22/19 0115  BP: 110/60 101/60 128/72   Pulse: (!) 109 (!) 105 (!) 108 (!) 103  Resp: (!) 26 (!) 25 (!) 23 (!) 24  Temp:      TempSrc:      SpO2: 91% 92% 93% 93%  Weight:      Height:      PainSc:        Isolation  Precautions Airborne and Contact precautions  Medications Medications  dexamethasone (DECADRON) injection 6 mg (6 mg Intravenous Given 04/21/19 2011)  remdesivir 200 mg in sodium chloride 0.9% 250 mL IVPB (0 mg Intravenous Stopped 04/21/19 2158)    Followed by  remdesivir 100 mg in sodium chloride 0.9 % 100 mL IVPB (has no administration in time range)  albuterol (VENTOLIN HFA) 108 (90 Base) MCG/ACT inhaler 2 puff (2 puffs Inhalation Given 04/21/19 2140)  sodium chloride 0.9 % bolus 1,000 mL (0 mLs Intravenous Stopped 04/21/19 2203)    Mobility walks with person assist High fall risk   Focused Assessments    R Recommendations: See Admitting Provider Note  Report given to:   Additional Notes:

## 2019-04-22 NOTE — Progress Notes (Signed)
Chaplain is aware of the need for and AD. Due to [facility] testing positive for COVID-19 Chaplain is not currently able to complete the AD at this time. Chaplain's office is working on a plan for having notaries and volunteers available for AD completion while keeping everyone safe. Chaplain remains available for support as needs arise.   Chaplain Resident, Evelene Croon, [place]. [phone]

## 2019-04-23 LAB — COMPREHENSIVE METABOLIC PANEL
ALT: 13 U/L (ref 0–44)
AST: 17 U/L (ref 15–41)
Albumin: 2.4 g/dL — ABNORMAL LOW (ref 3.5–5.0)
Alkaline Phosphatase: 64 U/L (ref 38–126)
Anion gap: 10 (ref 5–15)
BUN: 18 mg/dL (ref 8–23)
CO2: 23 mmol/L (ref 22–32)
Calcium: 8.2 mg/dL — ABNORMAL LOW (ref 8.9–10.3)
Chloride: 106 mmol/L (ref 98–111)
Creatinine, Ser: 0.84 mg/dL (ref 0.44–1.00)
GFR calc Af Amer: >60 mL/min (ref >60)
GFR calc non Af Amer: >60 mL/min (ref >60)
Glucose, Bld: 245 mg/dL — ABNORMAL HIGH (ref 70–99)
Potassium: 4.1 mmol/L (ref 3.5–5.1)
Sodium: 139 mmol/L (ref 135–145)
Total Bilirubin: 0.5 mg/dL (ref 0.3–1.2)
Total Protein: 6.1 g/dL — ABNORMAL LOW (ref 6.5–8.1)

## 2019-04-23 LAB — CBC WITH DIFFERENTIAL/PLATELET
Abs Immature Granulocytes: 0.05 10*3/uL (ref 0.00–0.07)
Basophils Absolute: 0 10*3/uL (ref 0.0–0.1)
Basophils Relative: 1 %
Eosinophils Absolute: 0 10*3/uL (ref 0.0–0.5)
Eosinophils Relative: 0 %
HCT: 34.9 % — ABNORMAL LOW (ref 36.0–46.0)
Hemoglobin: 10.9 g/dL — ABNORMAL LOW (ref 12.0–15.0)
Immature Granulocytes: 1 %
Lymphocytes Relative: 14 %
Lymphs Abs: 1.3 10*3/uL (ref 0.7–4.0)
MCH: 24.6 pg — ABNORMAL LOW (ref 26.0–34.0)
MCHC: 31.2 g/dL (ref 30.0–36.0)
MCV: 78.8 fL — ABNORMAL LOW (ref 80.0–100.0)
Monocytes Absolute: 0.6 10*3/uL (ref 0.1–1.0)
Monocytes Relative: 6 %
Neutro Abs: 6.9 10*3/uL (ref 1.7–7.7)
Neutrophils Relative %: 78 %
Platelets: 119 10*3/uL — ABNORMAL LOW (ref 150–400)
RBC: 4.43 MIL/uL (ref 3.87–5.11)
RDW: 23.8 % — ABNORMAL HIGH (ref 11.5–15.5)
WBC: 8.9 10*3/uL (ref 4.0–10.5)
nRBC: 0 % (ref 0.0–0.2)

## 2019-04-23 LAB — FERRITIN: Ferritin: 42 ng/mL (ref 11–307)

## 2019-04-23 LAB — MRSA PCR SCREENING: MRSA by PCR: NEGATIVE

## 2019-04-23 LAB — GLUCOSE, CAPILLARY
Glucose-Capillary: 230 mg/dL — ABNORMAL HIGH (ref 70–99)
Glucose-Capillary: 291 mg/dL — ABNORMAL HIGH (ref 70–99)
Glucose-Capillary: 161 mg/dL — ABNORMAL HIGH (ref 70–99)
Glucose-Capillary: 190 mg/dL — ABNORMAL HIGH (ref 70–99)

## 2019-04-23 LAB — FIBRINOGEN: Fibrinogen: 391 mg/dL (ref 210–475)

## 2019-04-23 LAB — LACTATE DEHYDROGENASE: LDH: 284 U/L — ABNORMAL HIGH (ref 98–192)

## 2019-04-23 LAB — C-REACTIVE PROTEIN: CRP: 6 mg/dL — ABNORMAL HIGH (ref <1.0)

## 2019-04-23 LAB — D-DIMER, QUANTITATIVE: D-Dimer, Quant: 6.81 ug/mL-FEU — ABNORMAL HIGH (ref 0.00–0.50)

## 2019-04-23 MED ORDER — INSULIN ASPART 100 UNIT/ML ~~LOC~~ SOLN
4.0000 [IU] | Freq: Three times a day (TID) | SUBCUTANEOUS | Status: DC
  Administered 2019-04-23 – 2019-04-24 (×4): 4 [IU] via SUBCUTANEOUS

## 2019-04-23 MED ORDER — INSULIN GLARGINE 100 UNIT/ML ~~LOC~~ SOLN
7.0000 [IU] | Freq: Every day | SUBCUTANEOUS | Status: DC
  Administered 2019-04-23 – 2019-04-26 (×4): 7 [IU] via SUBCUTANEOUS
  Filled 2019-04-23 (×4): qty 0.07

## 2019-04-23 NOTE — Progress Notes (Addendum)
PROGRESS NOTE  Regina Munoz DXI:[id number] DOB: May 01, 1951 DOA: 04/21/2019 PCP: Scotty Court, DO  HPI/Recap of past 61 hours: 68 year old female with past medical history significant for type 2 diabetes, asthma and anxiety who presented to the ED from home due to difficulty breathing and cough.  Associated with fever chills nausea vomiting loss of taste and diarrhea.  Symptoms started 2 weeks ago and have progressed.  Patient spouse and son tested positive for COVID-19.  Patient tested positive for COVID-19 on 04/15/2019.  Hypoxic on presentation with O2 saturation in the 80s on room air.  Chest x-ray showed patchy infiltrates worse at bases.  Started on remdesivir and Decadron.  Admitted for acute COVID-19 viral pneumonia.  04/23/19: Seen and examined.  Reports persistent cough, nonproductive.  Denies chest pain.  Admits to pain in her lower abdomen when she coughs.  Nausea is improved.  No vomiting.   Assessment/Plan: Active Problems:   Pneumonia due to COVID-19 virus   Acute COVID-19 viral pneumonia Initially hypoxemic with O2 saturation in the 80s on room air.   Covid -19 positive on 04/15/19 O2 saturation improved on 3.5 L by nasal cannula>> improved now on room air with O2 saturation 93%. Independently viewed chest x-ray done on admission which shows patchy infiltrates bilaterally worse at bases. Continue remdesivir, to complete 5 days and IV Decadron 6 mg daily to complete 10 days. Continue albuterol inhaler and ipratropium inhaler 3 times daily Continue vitamin C, D3 and zinc Continue incentive spirometer Continue to trend inflammatory markers.  Acute hypoxic respiratory failure secondary to acute COVID-19 viral pneumonia Management as stated above.  Type 2 diabetes with hyperglycemia Hemoglobin A1c 7.8 Uncontrolled hyperglycemia exacerbated by Decadron Add Lantus 7 units daily Add NovoLog 4 units 3 times daily. Continue insulin sliding scale  Chronic  anxiety/depression Continue home medication  GERD Continue home medication  Obesity BMI 41 Recommend weight loss outpatient with regular physical activity and healthy diet daily once healed from COVID-19 viral pneumonia. PT OT to assess.   DVT prophylaxis: Lovenox subcu daily Code Status: Full code Family Communication:  None at bedside.  Disposition Plan:  Possible discharge to home in 72 hours once complete course of remdesivir.   Consults called: None   Objective: Vitals:   04/22/19 2043 04/22/19 2356 04/23/19 0000 04/23/19 0414  BP:  98/72 104/64 (!) 106/95  Pulse:   91   Resp:   (!) 25   Temp: 98.1 F (36.7 C) 98.1 F (36.7 C)  98.2 F (36.8 C)  TempSrc:      SpO2:   93%   Weight:      Height:        Intake/Output Summary (Last 24 hours) at 04/23/2019 1137 Last data filed at 04/22/2019 1500 Gross per 24 hour  Intake 420 ml  Output -  Net 420 ml   Filed Weights   04/21/19 1715 04/22/19 0604  Weight: 109.8 kg 107 kg    Exam:  . General: 68 y.o. year-old female well-developed well-nourished in no acute distress.  Alert and oriented x3.   . Cardiovascular: Regular rate and rhythm no rubs or gallops.  Marland Kitchen Respiratory: Faint rales at bases.  No wheezing noted.  Good respiratory effort.   . Abdomen: Obese nontender normal bowel sounds present.  . Musculoskeletal: Trace lower extremity edema.  2 out of 4 pulses in all 4 extremities.   Marland Kitchen Psychiatry: Mood is appropriate for condition and setting.   Data Reviewed: CBC: Recent Labs  Lab 04/21/19 1722 04/23/19 0500  WBC 7.1 8.9  NEUTROABS 5.1 6.9  HGB 13.3 10.9*  HCT 44.5 34.9*  MCV 80.3 78.8*  PLT 134* 119*   Basic Metabolic Panel: Recent Labs  Lab 04/21/19 1722 04/23/19 0500  NA 138 139  K 3.8 4.1  CL 99 106  CO2 24 23  GLUCOSE 221* 245*  BUN 12 18  CREATININE 0.84 0.84  CALCIUM 8.7* 8.2*   GFR: Estimated Creatinine Clearance: 76.1 mL/min (by C-G formula based on SCr of 0.84 mg/dL). Liver  Function Tests: Recent Labs  Lab 04/21/19 1722 04/23/19 0500  AST 25 17  ALT 17 13  ALKPHOS 84 64  BILITOT 1.2 0.5  PROT 7.5 6.1*  ALBUMIN 3.1* 2.4*   No results for input(s): LIPASE, AMYLASE in the last 168 hours. No results for input(s): AMMONIA in the last 168 hours. Coagulation Profile: No results for input(s): INR, PROTIME in the last 168 hours. Cardiac Enzymes: No results for input(s): CKTOTAL, CKMB, CKMBINDEX, TROPONINI in the last 168 hours. BNP (last 3 results) No results for input(s): PROBNP in the last 8760 hours. HbA1C: Recent Labs    04/22/19 1100  HGBA1C 7.8*   CBG: Recent Labs  Lab 04/22/19 1200 04/22/19 1646 04/22/19 2023 04/23/19 0809  GLUCAP 261* 262* 169* 230*   Lipid Profile: Recent Labs    04/21/19 1722  TRIG 123   Thyroid Function Tests: No results for input(s): TSH, T4TOTAL, FREET4, T3FREE, THYROIDAB in the last 72 hours. Anemia Panel: Recent Labs    04/21/19 1722 04/23/19 0500  FERRITIN 40 42   Urine analysis: No results found for: COLORURINE, APPEARANCEUR, LABSPEC, PHURINE, GLUCOSEU, HGBUR, BILIRUBINUR, KETONESUR, PROTEINUR, UROBILINOGEN, NITRITE, LEUKOCYTESUR Sepsis Labs: @LABRCNTIP (procalcitonin:4,lacticidven:4)  ) Recent Results (from the past 240 hour(s))  Novel Coronavirus, NAA ([facility])     Status: Abnormal   Collection Time: 04/15/19  8:50 AM   Specimen: Nasopharyngeal(NP) swabs in vial transport medium   NASOPHARYNGE  TESTING  Result Value Ref Range Status   SARS-CoV-2, NAA Detected (A) Not Detected Final    Comment: This nucleic acid amplification test was developed and its performance characteristics determined by 04/17/19. Nucleic acid amplification tests include PCR and TMA. This test has not been FDA cleared or approved. This test has been authorized by FDA under an Emergency Use Authorization (EUA). This test is only authorized for the duration of time the declaration that circumstances exist  justifying the authorization of the emergency use of in vitro diagnostic tests for detection of SARS-CoV-2 virus and/or diagnosis of COVID-19 infection under section 564(b)(1) of the Act, 21 U.S.C. [employer]) (1), unless the authorization is terminated or revoked sooner. When diagnostic testing is negative, the possibility of a false negative result should be considered in the context of a patient's recent exposures and the presence of clinical signs and symptoms consistent with COVID-19. An individual without symptoms of COVID-19 and who is not shedding SARS-CoV-2 virus would  expect to have a negative (not detected) result in this assay.   Blood Culture (routine x 2)     Status: None (Preliminary result)   Collection Time: 04/21/19  5:38 PM   Specimen: BLOOD LEFT ARM  Result Value Ref Range Status   Specimen Description BLOOD LEFT ARM  Final   Special Requests   Final    BOTTLES DRAWN AEROBIC AND ANAEROBIC Blood Culture adequate volume   Culture   Final    NO GROWTH 2 DAYS Performed at [facility], [address] [place], [place] [address]    Report Status PENDING  Incomplete  Blood Culture (routine x 2)     Status: None (Preliminary result)   Collection Time: 04/21/19  5:38 PM   Specimen: Right Antecubital; Blood  Result Value Ref Range Status   Specimen Description RIGHT ANTECUBITAL  Final   Special Requests   Final    BOTTLES DRAWN AEROBIC AND ANAEROBIC Blood Culture adequate volume   Culture   Final    NO GROWTH 2 DAYS Performed at [facility], [address] [place], [place] [address]    Report Status PENDING  Incomplete      Studies: No results found.  Scheduled Meds: . albuterol  2 puff Inhalation Q8H  . aspirin EC  81 mg Oral Daily  . buPROPion  100 mg Oral Daily  . cholecalciferol  1,000 Units Oral Daily  . dexamethasone (DECADRON) injection  6 mg Intravenous Q24H  . dextromethorphan-guaiFENesin  1 tablet Oral BID  . enoxaparin (LOVENOX) injection  55  mg Subcutaneous Q24H  . feeding supplement (ENSURE ENLIVE)  237 mL Oral BID BM  . insulin aspart  0-15 Units Subcutaneous TID WC  . insulin aspart  0-5 Units Subcutaneous QHS  . ipratropium  2 puff Inhalation Q8H  . pantoprazole  40 mg Oral Daily  . vitamin C  500 mg Oral Daily  . zinc sulfate  220 mg Oral Daily    Continuous Infusions: . remdesivir 100 mg in NS 100 mL 100 mg (04/23/19 0951)     LOS: 2 days     Darlin Droparole N Nairi Oswald, MD [facility] Pager [phone]  If 7PM-7AM, please contact night-coverage [email or web address] Password [email or web address] 04/23/2019, 11:37 AM

## 2019-04-23 NOTE — Progress Notes (Signed)
Inpatient Diabetes Program Recommendations  AACE/ADA: New Consensus Statement on Inpatient Glycemic Control (2015)  Target Ranges:  Prepandial:   less than 140 mg/dL      Peak postprandial:   less than 180 mg/dL (1-2 hours)      Critically ill patients:  140 - 180 mg/dL   Lab Results  Component Value Date   GLUCAP 230 (H) 04/23/2019   HGBA1C 7.8 (H) 04/22/2019    Review of Glycemic Control Results for Regina Munoz, Regina Munoz (MRN [id number]) as of 04/23/2019 10:47  Ref. Range 04/22/2019 12:00 04/22/2019 16:46 04/22/2019 20:23 04/23/2019 08:09  Glucose-Capillary Latest Ref Range: 70 - 99 mg/dL 261 (H) 262 (H) 169 (H) 230 (H)   Diabetes history: DM 2 Outpatient Diabetes medications: Metformin 500 mg bid Current orders for Inpatient glycemic control:  Novolog 0-15 units tid + hs  Decadron 6 mg Q24 hours A1c 7.8% BUN/Creat:  18/0.84 Ensure Enlive bid between meals   Inpatient Diabetes Program Recommendations:  Insulin - Basal: Consider Lantus 6-8 units for glucose control.  Thanks,  Tama Headings RN, MSN, BC-ADM Inpatient Diabetes Coordinator Team Pager [phone] (8a-5p)

## 2019-04-24 ENCOUNTER — Other Ambulatory Visit (HOSPITAL_COMMUNITY): Payer: Self-pay | Admitting: Radiology

## 2019-04-24 ENCOUNTER — Inpatient Hospital Stay (HOSPITAL_COMMUNITY): Payer: PPO

## 2019-04-24 LAB — CBC WITH DIFFERENTIAL/PLATELET
Abs Immature Granulocytes: 0.07 10*3/uL (ref 0.00–0.07)
Basophils Absolute: 0 10*3/uL (ref 0.0–0.1)
Basophils Relative: 0 %
Eosinophils Absolute: 0 10*3/uL (ref 0.0–0.5)
Eosinophils Relative: 0 %
HCT: 37.2 % (ref 36.0–46.0)
Hemoglobin: 11.6 g/dL — ABNORMAL LOW (ref 12.0–15.0)
Immature Granulocytes: 1 %
Lymphocytes Relative: 16 %
Lymphs Abs: 1.3 10*3/uL (ref 0.7–4.0)
MCH: 24.1 pg — ABNORMAL LOW (ref 26.0–34.0)
MCHC: 31.2 g/dL (ref 30.0–36.0)
MCV: 77.3 fL — ABNORMAL LOW (ref 80.0–100.0)
Monocytes Absolute: 0.5 10*3/uL (ref 0.1–1.0)
Monocytes Relative: 7 %
Neutro Abs: 6 10*3/uL (ref 1.7–7.7)
Neutrophils Relative %: 76 %
Platelets: 128 10*3/uL — ABNORMAL LOW (ref 150–400)
RBC: 4.81 MIL/uL (ref 3.87–5.11)
RDW: 23.6 % — ABNORMAL HIGH (ref 11.5–15.5)
WBC: 7.9 10*3/uL (ref 4.0–10.5)
nRBC: 0 % (ref 0.0–0.2)

## 2019-04-24 LAB — COMPREHENSIVE METABOLIC PANEL
ALT: 13 U/L (ref 0–44)
AST: 22 U/L (ref 15–41)
Albumin: 2.3 g/dL — ABNORMAL LOW (ref 3.5–5.0)
Alkaline Phosphatase: 75 U/L (ref 38–126)
Anion gap: 11 (ref 5–15)
BUN: 14 mg/dL (ref 8–23)
CO2: 25 mmol/L (ref 22–32)
Calcium: 8.4 mg/dL — ABNORMAL LOW (ref 8.9–10.3)
Chloride: 103 mmol/L (ref 98–111)
Creatinine, Ser: 0.83 mg/dL (ref 0.44–1.00)
GFR calc Af Amer: >60 mL/min (ref >60)
GFR calc non Af Amer: >60 mL/min (ref >60)
Glucose, Bld: 275 mg/dL — ABNORMAL HIGH (ref 70–99)
Potassium: 4.8 mmol/L (ref 3.5–5.1)
Sodium: 139 mmol/L (ref 135–145)
Total Bilirubin: 0.8 mg/dL (ref 0.3–1.2)
Total Protein: 6.2 g/dL — ABNORMAL LOW (ref 6.5–8.1)

## 2019-04-24 LAB — GLUCOSE, CAPILLARY
Glucose-Capillary: 243 mg/dL — ABNORMAL HIGH (ref 70–99)
Glucose-Capillary: 221 mg/dL — ABNORMAL HIGH (ref 70–99)
Glucose-Capillary: 267 mg/dL — ABNORMAL HIGH (ref 70–99)
Glucose-Capillary: 181 mg/dL — ABNORMAL HIGH (ref 70–99)

## 2019-04-24 LAB — BRAIN NATRIURETIC PEPTIDE: B Natriuretic Peptide: 193.7 pg/mL — ABNORMAL HIGH (ref 0.0–100.0)

## 2019-04-24 LAB — LACTATE DEHYDROGENASE: LDH: 315 U/L — ABNORMAL HIGH (ref 98–192)

## 2019-04-24 LAB — C-REACTIVE PROTEIN: CRP: 2.7 mg/dL — ABNORMAL HIGH (ref <1.0)

## 2019-04-24 LAB — D-DIMER, QUANTITATIVE: D-Dimer, Quant: 7.75 ug/mL-FEU — ABNORMAL HIGH (ref 0.00–0.50)

## 2019-04-24 LAB — FERRITIN: Ferritin: 36 ng/mL (ref 11–307)

## 2019-04-24 MED ORDER — IOHEXOL 350 MG/ML SOLN
75.0000 mL | Freq: Once | INTRAVENOUS | Status: AC | PRN
  Administered 2019-04-24: 21:00:00 75 mL via INTRAVENOUS

## 2019-04-24 MED ORDER — INSULIN ASPART 100 UNIT/ML ~~LOC~~ SOLN
7.0000 [IU] | Freq: Three times a day (TID) | SUBCUTANEOUS | Status: DC
  Administered 2019-04-24 – 2019-05-02 (×19): 7 [IU] via SUBCUTANEOUS

## 2019-04-24 MED ORDER — HEPARIN BOLUS VIA INFUSION
3100.0000 [IU] | Freq: Once | INTRAVENOUS | Status: AC
  Administered 2019-04-24: 3100 [IU] via INTRAVENOUS
  Filled 2019-04-24: qty 3100

## 2019-04-24 MED ORDER — HEPARIN (PORCINE) 25000 UT/250ML-% IV SOLN
1250.0000 [IU]/h | INTRAVENOUS | Status: DC
  Administered 2019-04-24 – 2019-04-27 (×4): 1250 [IU]/h via INTRAVENOUS
  Filled 2019-04-24 (×3): qty 250
  Filled 2019-04-24: qty 500

## 2019-04-24 NOTE — Progress Notes (Signed)
NP called by radiologist to report abnormal findings of CTA chest. + bilateral PEs with right heart strain. Pt's chart reviewed. No obvious contraindication for Heparin. Lovenox d/c'd. Heparin per pharmacy consult.  KJKG, NP [facility]

## 2019-04-24 NOTE — Progress Notes (Signed)
ANTICOAGULATION CONSULT NOTE - Initial Consult  Pharmacy Consult for Heparin Indication: pulmonary embolus  No Known Allergies  Patient Measurements: Height: 5\' 3"  (160 cm) Weight: 235 lb 14.3 oz (107 kg) IBW/kg (Calculated) : 52.4 Heparin Dosing Weight: 78 kg  Vital Signs: Temp: 97.7 F (36.5 C) (12/09 2123) Temp Source: Oral (12/09 2123) BP: 105/63 (12/09 2123) Pulse Rate: 98 (12/09 2123)  Labs: Recent Labs    04/23/19 0500 04/24/19 0500  HGB 10.9* 11.6*  HCT 34.9* 37.2  PLT 119* 128*  CREATININE 0.84 0.83    Estimated Creatinine Clearance: 77 mL/min (by C-G formula based on SCr of 0.83 mg/dL).   Medical History: Past Medical History:  Diagnosis Date  . Anxiety   . Arthritis   . Asthma   . Complication of anesthesia   . COVID-19   . Diabetes mellitus without complication ([place])   . Dysrhythmia    palpitations ->20 yrs stress test neg nothing since  . GERD (gastroesophageal reflux disease)   . Headache   . PONV (postoperative nausea and vomiting)     Assessment: 68 yr old female admitted on 04/21/19 with difficulty breathing/cough; she had tested positive for COVID on 03/19/19. Pt now with new bilateral PEs with right heart strain. Pharmacy consulted to dose heparin. Pt was on no anticoagulation PTA. Pt has been receiving Lovenox 0.5 mg/kg (55 mg) SQ daily for VTE prophylaxis (last dose at 11:01 AM today).  H/H 11.6/37.2, platelets 120 (CBC stable)  Goal of Therapy:  Heparin level 0.3-0.7 units/ml Monitor platelets by anticoagulation protocol: Yes   Plan: Heparin 3100 units IV bolus X 1, followed by heparin infusion at 1250 units/hr Check 6-hr HL Monitor daily heparin level, CBC Monitor for signs/symptoms of bleeding  Gillermina Hu, PharmD, BCPS, [facility] Clinical Pharmacist 04/24/2019,9:52 PM

## 2019-04-24 NOTE — Progress Notes (Signed)
VAST consulted regarding IV infiltration of Remdesivir. Contacted pharmacy and notified that there is not enough research or a known anecdote for Remdesivir infiltration at this time. Advised to use warm or cold compresses.  Notified Margarita Grizzle, Therapist, sports.

## 2019-04-24 NOTE — Progress Notes (Signed)
Paged Dr. [place] Crane to let her know that patient desatted again when she was walked.  Ambulatory sats in this note.  SATURATION QUALIFICATIONS: (This note is used to comply with regulatory documentation for home oxygen)  Patient Saturations on Room Air at Rest =92%  Patient Saturations on Room Air while Ambulating = 74% on 1.5 Liters [place], can't tolerate walking with no oxygen.  Patient Saturations on 1.5 Liters of oxygen while Ambulating = 74%  Please briefly explain why patient needs home oxygen: pt cannot tolerate walking 15 feet to restroom even with 1.5 LNC on, she gets winded, and tachycardic.  Needs assistance to get back to bed.

## 2019-04-24 NOTE — Evaluation (Signed)
Physical Therapy Evaluation Patient Details Name: Regina Munoz MRN: [id number] DOB: November 11, 1950 Today's Date: 04/24/2019   History of Present Illness  68 y.o. female with medical history significant for diabetes mellitus, asthma and anxiety.  Patient presented to the ED with complaints of difficulty breathing, cough, fever chills nausea vomiting loss of taste and diarrhea. Patient tested positive for COVID-19 11/30. Admitted 04/21/19 for treatment of COVID PNA  Clinical Impression  PTA pt living with husband and children in mobile home with 2 steps to enter. Pt was driving and completely independent. Pt is currently limited in safe mobility by oxygen desaturation in presence of generalized weakness and mild instability. Pt is mod I for bed mobility, supervision for transfers and min guard for ambulation with RW and minA for ambulation without AD. Pt advised to ambulate with RW until a little more steady on her feet. PT does not anticipate any further PT needs at d/c, however PT will continue to see pt acutely to progress strengthening and balance.     Follow Up Recommendations No PT follow up;Supervision for mobility/OOB    Equipment Recommendations  None recommended by PT       Precautions / Restrictions Precautions Precautions: None Restrictions Weight Bearing Restrictions: No      Mobility  Bed Mobility Overal bed mobility: Modified Independent                Transfers Overall transfer level: Needs assistance Equipment used: Rolling walker (2 wheeled) Transfers: Sit to/from Stand Sit to Stand: Supervision         General transfer comment: supervision for safety   Ambulation/Gait Ambulation/Gait assistance: Min guard;Min assist Gait Distance (Feet): 30 Feet Assistive device: Rolling walker (2 wheeled) Gait Pattern/deviations: Step-to pattern;Decreased step length - right;Decreased step length - left Gait velocity: slowed Gait velocity interpretation: <1.8  ft/sec, indicate of risk for recurrent falls General Gait Details: min guard for safety with RW, attempted to ambulate without RW and requires minA for steadying      Balance Overall balance assessment: Needs assistance Sitting-balance support: Feet supported;No upper extremity supported Sitting balance-Leahy Scale: Normal     Standing balance support: No upper extremity supported;During functional activity Standing balance-Leahy Scale: Good                               Pertinent Vitals/Pain Pain Assessment: No/denies pain    Home Living Family/patient expects to be discharged to:: Private residence Living Arrangements: Spouse/significant other;Children Available Help at Discharge: Family;Available 24 hours/day Type of Home: Mobile home Home Access: Stairs to enter Entrance Stairs-Rails: Right;Left;Can reach both Entrance Stairs-Number of Steps: 2 Home Layout: One level Home Equipment: Walker - 2 wheels;Shower seat - built in;Shower seat;Hand held shower head;Bedside commode      Prior Function           Comments: Independent, drives     Hand Dominance        Extremity/Trunk Assessment   Upper Extremity Assessment Upper Extremity Assessment: Defer to OT evaluation    Lower Extremity Assessment Lower Extremity Assessment: RLE deficits/detail;LLE deficits/detail RLE Deficits / Details: TKA, ROM WFL, strength grossly assessed at 4/5 LLE Deficits / Details: TKA, ROM WFL, strength grossly assessed at 4/5       Communication   Communication: No difficulties  Cognition Arousal/Alertness: Awake/alert Behavior During Therapy: WFL for tasks assessed/performed Overall Cognitive Status: Within Functional Limits for tasks assessed  General Comments General comments (skin integrity, edema, etc.): Pt on 1 L O2 via [place] on entry with SaO2 96%O2 with ambulation SaO2 dropped to 87%O2 increased O2 to 2L  and pt able to finish ambulation >92%O2, with sitting pt placed back on 1L O2 and pt able to maintain SaO2>92%O2        Assessment/Plan    PT Assessment Patient needs continued PT services  PT Problem List Decreased strength;Decreased balance;Decreased mobility;Cardiopulmonary status limiting activity       PT Treatment Interventions DME instruction;Gait training;Stair training;Functional mobility training;Therapeutic activities;Therapeutic exercise;Balance training;Cognitive remediation;Patient/family education    PT Goals (Current goals can be found in the Care Plan section)  Acute Rehab PT Goals Patient Stated Goal: get her strength back  PT Goal Formulation: With patient Potential to Achieve Goals: Good    Frequency Min 3X/week    AM-PAC PT "6 Clicks" Mobility  Outcome Measure Help needed turning from your back to your side while in a flat bed without using bedrails?: None Help needed moving from lying on your back to sitting on the side of a flat bed without using bedrails?: None Help needed moving to and from a bed to a chair (including a wheelchair)?: None Help needed standing up from a chair using your arms (e.g., wheelchair or bedside chair)?: None Help needed to walk in hospital room?: A Little Help needed climbing 3-5 steps with a railing? : A Little 6 Click Score: 22    End of Session Equipment Utilized During Treatment: Gait belt;Oxygen Activity Tolerance: Patient tolerated treatment well Patient left: in chair;with call bell/phone within reach Nurse Communication: Mobility status PT Visit Diagnosis: Unsteadiness on feet (R26.81);Other abnormalities of gait and mobility (R26.89);Muscle weakness (generalized) (M62.81)    Time: 4098-1191 PT Time Calculation (min) (ACUTE ONLY): 28 min   Charges:   PT Evaluation $PT Eval Moderate Complexity: 1 Mod PT Treatments $Gait Training: 8-22 mins        Muaz Shorey B. Migdalia Dk PT, DPT Acute Rehabilitation  Services Pager [phone] Office [phone]   [place] 04/24/2019, 12:46 PM

## 2019-04-24 NOTE — Progress Notes (Signed)
PROGRESS NOTE  Regina Munoz TGG:[id number] DOB: Nov 26, 1950 DOA: 04/21/2019 PCP: [name], DO  HPI/Recap of past 30 hours: 68 year old female with past medical history significant for type 2 diabetes, asthma and anxiety who presented to the ED from home due to difficulty breathing and cough.  Associated with fever chills nausea vomiting loss of taste and diarrhea.  Symptoms started 2 weeks ago and have progressed.  Patient spouse and son tested positive for COVID-19.  Patient tested positive for COVID-19 on 04/15/2019.  Hypoxic on presentation with O2 saturation in the 80s on room air.  Chest x-ray showed patchy infiltrates worse at bases.  Started on remdesivir and Decadron.  Admitted for acute COVID-19 viral pneumonia.  04/24/19: Seen and examined.  States she feels fine today however this afternoon bedside RN reports significant hypoxia with ambulation with O2 saturation dropping in the 70s.  O2 saturation returning to 90% at rest on 1.5L.   Assessment/Plan: Active Problems:   Pneumonia due to COVID-19 virus   Acute COVID-19 viral pneumonia Initially hypoxemic on presentation with O2 saturation in the 80s on room air.   Covid -19 positive on 04/15/19 Significant drop in oxygen saturation with minimal ambulation >> in the 70's reported by bedside RN Katrinka Blazing On Remdesivir day #4/5 C/w Decadron to complete 10 days  Obtain BNP as add-on lab.  If elevated will diurese. Continue albuterol inhaler and ipratropium inhaler 3 times daily Continue vitamin C, D3 and zinc Continue incentive spirometer Continue to trend inflammatory markers. Transfer to [facility] to continue care  Acute hypoxic respiratory failure secondary to acute COVID-19 viral pneumonia Management as stated above.  Sinus tachycardia with prolonged QTC Reviewed 12 lead EKG Repeat 12 lead EKG and to reassess QTC  Type 2 diabetes with hyperglycemia Hemoglobin A1c 7.8 Uncontrolled hyperglycemia exacerbated by Decadron  C/w Lantus 7 units daily Increase dose  NovoLog 7 units 3 times daily. Continue insulin sliding scale  Chronic anxiety/depression Continue home medication  GERD Continue home medication  Obesity BMI 41 Recommend weight loss outpatient with regular physical activity and healthy diet daily once healed from COVID-19 viral pneumonia. PT OT to assess.   DVT prophylaxis: Lovenox subcu daily Code Status: Full code Family Communication:  None at bedside.  Disposition Plan:  Transfer to [facility] to continue care.  Consults called: None   Objective: Vitals:   04/24/19 1200 04/24/19 1355 04/24/19 1401 04/24/19 1403  BP: 117/68     Pulse: 93 (!) 123  (!) 114  Resp: (!) 30 (!) 27  15  Temp:   97.8 F (36.6 C)   TempSrc:   Axillary   SpO2: 96% (!) 74%  91%  Weight:      Height:        Intake/Output Summary (Last 24 hours) at 04/24/2019 1419 Last data filed at 04/23/2019 1618 Gross per 24 hour  Intake 260 ml  Output -  Net 260 ml   Filed Weights   04/21/19 1715 04/22/19 0604  Weight: 109.8 kg 107 kg    Exam:  . General: 68 y.o. year-old female well-developed well-nourished in no acute distress.  Alert and oriented x3.   . Cardiovascular: Regular rate and rhythm no rubs or gallops.   Marland Kitchen Respiratory: Fine rales at bases no wheezing noted.  Good respiratory effort. . Abdomen: Obese nontender normal bowel sounds present. Musculoskeletal: Trace lower extremity edema in lower extremities.  Psychiatry: Mood is appropriate for condition and setting.  Data Reviewed: CBC: Recent Labs  Lab  04/21/19 1722 04/23/19 0500 04/24/19 0500  WBC 7.1 8.9 7.9  NEUTROABS 5.1 6.9 6.0  HGB 13.3 10.9* 11.6*  HCT 44.5 34.9* 37.2  MCV 80.3 78.8* 77.3*  PLT 134* 119* 128*   Basic Metabolic Panel: Recent Labs  Lab 04/21/19 1722 04/23/19 0500 04/24/19 0500  NA 138 139 139  K 3.8 4.1 4.8  CL 99 106 103  CO2 24 23 25   GLUCOSE 221* 245* 275*  BUN 12 18 14   CREATININE 0.84 0.84 0.83   CALCIUM 8.7* 8.2* 8.4*   GFR: Estimated Creatinine Clearance: 77 mL/min (by C-G formula based on SCr of 0.83 mg/dL). Liver Function Tests: Recent Labs  Lab 04/21/19 1722 04/23/19 0500 04/24/19 0500  AST 25 17 22   ALT 17 13 13   ALKPHOS 84 64 75  BILITOT 1.2 0.5 0.8  PROT 7.5 6.1* 6.2*  ALBUMIN 3.1* 2.4* 2.3*   No results for input(s): LIPASE, AMYLASE in the last 168 hours. No results for input(s): AMMONIA in the last 168 hours. Coagulation Profile: No results for input(s): INR, PROTIME in the last 168 hours. Cardiac Enzymes: No results for input(s): CKTOTAL, CKMB, CKMBINDEX, TROPONINI in the last 168 hours. BNP (last 3 results) No results for input(s): PROBNP in the last 8760 hours. HbA1C: Recent Labs    04/22/19 1100  HGBA1C 7.8*   CBG: Recent Labs  Lab 04/23/19 1154 04/23/19 1636 04/23/19 2103 04/24/19 0800 04/24/19 1144  GLUCAP 291* 161* 190* 243* 221*   Lipid Profile: Recent Labs    04/21/19 1722  TRIG 123   Thyroid Function Tests: No results for input(s): TSH, T4TOTAL, FREET4, T3FREE, THYROIDAB in the last 72 hours. Anemia Panel: Recent Labs    04/23/19 0500 04/24/19 0500  FERRITIN 42 36   Urine analysis: No results found for: COLORURINE, APPEARANCEUR, LABSPEC, PHURINE, GLUCOSEU, HGBUR, BILIRUBINUR, KETONESUR, PROTEINUR, UROBILINOGEN, NITRITE, LEUKOCYTESUR Sepsis Labs: @LABRCNTIP (procalcitonin:4,lacticidven:4)  ) Recent Results (from the past 240 hour(s))  Novel Coronavirus, NAA ([facility])     Status: Abnormal   Collection Time: 04/15/19  8:50 AM   Specimen: Nasopharyngeal(NP) swabs in vial transport medium   NASOPHARYNGE  TESTING  Result Value Ref Range Status   SARS-CoV-2, NAA Detected (A) Not Detected Final    Comment: This nucleic acid amplification test was developed and its performance characteristics determined by [facility]. Nucleic acid amplification tests include PCR and TMA. This test has not been FDA cleared or  approved. This test has been authorized by FDA under an Emergency Use Authorization (EUA). This test is only authorized for the duration of time the declaration that circumstances exist justifying the authorization of the emergency use of in vitro diagnostic tests for detection of SARS-CoV-2 virus and/or diagnosis of COVID-19 infection under section 564(b)(1) of the Act, 21 U.S.C. 161WRU-0(A360bbb-3(b) (1), unless the authorization is terminated or revoked sooner. When diagnostic testing is negative, the possibility of a false negative result should be considered in the context of a patient's recent exposures and the presence of clinical signs and symptoms consistent with COVID-19. An individual without symptoms of COVID-19 and who is not shedding SARS-CoV-2 virus would  expect to have a negative (not detected) result in this assay.   Blood Culture (routine x 2)     Status: None (Preliminary result)   Collection Time: 04/21/19  5:38 PM   Specimen: BLOOD LEFT ARM  Result Value Ref Range Status   Specimen Description BLOOD LEFT ARM  Final   Special Requests   Final    BOTTLES  DRAWN AEROBIC AND ANAEROBIC Blood Culture adequate volume   Culture   Final    NO GROWTH 3 DAYS Performed at [facility], [address] [place], [place] [address]    Report Status PENDING  Incomplete  Blood Culture (routine x 2)     Status: None (Preliminary result)   Collection Time: 04/21/19  5:38 PM   Specimen: Right Antecubital; Blood  Result Value Ref Range Status   Specimen Description RIGHT ANTECUBITAL  Final   Special Requests   Final    BOTTLES DRAWN AEROBIC AND ANAEROBIC Blood Culture adequate volume   Culture   Final    NO GROWTH 3 DAYS Performed at [facility], [address], [place], [place] [address]    Report Status PENDING  Incomplete  MRSA PCR Screening     Status: None   Collection Time: 04/23/19 12:35 PM   Specimen: Nasal Mucosa; Nasopharyngeal  Result Value Ref Range Status   MRSA by PCR  NEGATIVE NEGATIVE Final    Comment:        The GeneXpert MRSA Assay (FDA approved for NASAL specimens only), is one component of a comprehensive MRSA colonization surveillance program. It is not intended to diagnose MRSA infection nor to guide or monitor treatment for MRSA infections. Performed at [facility], [place] [address], [place], [place] [address]       Studies: No results found.  Scheduled Meds: . albuterol  2 puff Inhalation Q8H  . aspirin EC  81 mg Oral Daily  . buPROPion  100 mg Oral Daily  . cholecalciferol  1,000 Units Oral Daily  . dexamethasone (DECADRON) injection  6 mg Intravenous Q24H  . dextromethorphan-guaiFENesin  1 tablet Oral BID  . enoxaparin (LOVENOX) injection  55 mg Subcutaneous Q24H  . feeding supplement (ENSURE ENLIVE)  237 mL Oral BID BM  . insulin aspart  0-15 Units Subcutaneous TID WC  . insulin aspart  0-5 Units Subcutaneous QHS  . insulin aspart  4 Units Subcutaneous TID WC  . insulin glargine  7 Units Subcutaneous Daily  . ipratropium  2 puff Inhalation Q8H  . pantoprazole  40 mg Oral Daily  . vitamin C  500 mg Oral Daily  . zinc sulfate  220 mg Oral Daily    Continuous Infusions: . remdesivir 100 mg in NS 100 mL Stopped (04/24/19 1101)     LOS: 3 days     Kayleen Memos, MD [facility] Pager [phone]  If 7PM-7AM, please contact night-coverage [email or web address] Password [email or web address] 04/24/2019, 2:19 PM

## 2019-04-24 NOTE — Evaluation (Signed)
Occupational Therapy Evaluation Patient Details Name: Regina Munoz MRN: [id number] DOB: 02-24-1951 Today's Date: 04/24/2019    History of Present Illness 68 y.o. female with medical history significant for diabetes mellitus, asthma and anxiety.  Patient presented to the ED with complaints of difficulty breathing, cough, fever chills nausea vomiting loss of taste and diarrhea. Patient tested positive for COVID-19 11/30. Admitted 04/21/19 for treatment of COVID PNA   Clinical Impression   This 68 y/o female presents with the above. PTA pt reports independence with ADL, iADL and functional mobility. Pt performing room level functional mobility using RW with overall minguard assist this session. She currently requires minguardA for toileting, LB and standing grooming ADL. Pt on 2L O2 with activity with spO2 >/=87%. She will benefit from continued acute OT services to progress pt towards her PLOF. Will follow.    Follow Up Recommendations  No OT follow up;Supervision - Intermittent    Equipment Recommendations  None recommended by OT           Precautions / Restrictions Precautions Precautions: None Precaution Comments: monitor O2 sats Restrictions Weight Bearing Restrictions: No      Mobility Bed Mobility Overal bed mobility: Modified Independent             General bed mobility comments: received OOB in recliner  Transfers Overall transfer level: Needs assistance Equipment used: Rolling walker (2 wheeled) Transfers: Sit to/from Stand Sit to Stand: Supervision         General transfer comment: supervision for safety, lines    Balance Overall balance assessment: Needs assistance Sitting-balance support: Feet supported;No upper extremity supported Sitting balance-Leahy Scale: Normal     Standing balance support: No upper extremity supported;During functional activity Standing balance-Leahy Scale: Good                             ADL either  performed or assessed with clinical judgement   ADL Overall ADL's : Needs assistance/impaired Eating/Feeding: Modified independent;Sitting   Grooming: Oral care;Wash/dry hands;Min guard;Supervision/safety;Standing   Upper Body Bathing: Set up;Sitting   Lower Body Bathing: Min guard;Sit to/from stand   Upper Body Dressing : Set up;Sitting   Lower Body Dressing: Min guard;Sit to/from stand   Toilet Transfer: Min guard;Ambulation;RW;Grab bars   Toileting- Clothing Manipulation and Hygiene: Min guard;Sit to/from stand;Sitting/lateral lean Toileting - Clothing Manipulation Details (indicate cue type and reason): pt performing peri-care via lateral leans while seated on toilet      Functional mobility during ADLs: Min guard;Rolling walker       Vision         Perception     Praxis      Pertinent Vitals/Pain Pain Assessment: No/denies pain     Hand Dominance     Extremity/Trunk Assessment Upper Extremity Assessment Upper Extremity Assessment: Overall WFL for tasks assessed   Lower Extremity Assessment Lower Extremity Assessment: Defer to PT evaluation RLE Deficits / Details: TKA, ROM WFL, strength grossly assessed at 4/5 LLE Deficits / Details: TKA, ROM WFL, strength grossly assessed at 4/5       Communication Communication Communication: No difficulties   Cognition Arousal/Alertness: Awake/alert Behavior During Therapy: WFL for tasks assessed/performed Overall Cognitive Status: Within Functional Limits for tasks assessed                                     General Comments  Pt on 1 L O2 via [place] on entry with SaO2 96%O2 with ambulation SaO2 dropped to 87%O2 increased O2 to 2L and pt able to finish ambulation >92%O2, with sitting pt placed back on 1L O2 and pt able to maintain SaO2>92%O2    Exercises     Shoulder Instructions      Home Living Family/patient expects to be discharged to:: Private residence Living Arrangements: Spouse/significant  other;Children Available Help at Discharge: Family;Available 24 hours/day Type of Home: Mobile home Home Access: Stairs to enter Entrance Stairs-Number of Steps: 2 Entrance Stairs-Rails: Right;Left;Can reach both Home Layout: One level     Bathroom Shower/Tub: Occupational psychologist: Handicapped height Bathroom Accessibility: Yes   Home Equipment: Environmental consultant - 2 wheels;Shower seat - built in;Shower seat;Hand held shower head;Bedside commode          Prior Functioning/Environment Level of Independence: Independent        Comments: Independent, drives and performs iADL        OT Problem List: Decreased strength;Decreased activity tolerance;Impaired balance (sitting and/or standing);Obesity;Cardiopulmonary status limiting activity      OT Treatment/Interventions: Self-care/ADL training;Therapeutic exercise;Energy conservation;DME and/or AE instruction;Therapeutic activities;Patient/family education;Balance training    OT Goals(Current goals can be found in the care plan section) Acute Rehab OT Goals Patient Stated Goal: get her strength back  OT Goal Formulation: With patient Time For Goal Achievement: 05/08/19 Potential to Achieve Goals: Good ADL Goals Pt Will Perform Grooming: with modified independence;standing Pt Will Perform Lower Body Bathing: with modified independence;sit to/from stand Pt Will Perform Lower Body Dressing: with modified independence;sit to/from stand Pt Will Transfer to Toilet: with modified independence;ambulating Pt Will Perform Toileting - Clothing Manipulation and hygiene: with modified independence;sit to/from stand Additional ADL Goal #1: Pt will independently verbalize/return demonstrate energy at least 3 conservation strategies to utilize during functional task completion.  OT Frequency: Min 2X/week   Barriers to D/C:            Co-evaluation              AM-PAC OT "6 Clicks" Daily Activity     Outcome Measure Help from  another person eating meals?: None Help from another person taking care of personal grooming?: None Help from another person toileting, which includes using toliet, bedpan, or urinal?: None Help from another person bathing (including washing, rinsing, drying)?: A Little Help from another person to put on and taking off regular upper body clothing?: None Help from another person to put on and taking off regular lower body clothing?: A Little 6 Click Score: 22   End of Session Equipment Utilized During Treatment: Gait belt;Rolling walker;Oxygen Nurse Communication: Mobility status  Activity Tolerance: Patient tolerated treatment well Patient left: in chair;with call bell/phone within reach  OT Visit Diagnosis: Other abnormalities of gait and mobility (R26.89);Other (comment)(decreased activity tolerance)                Time: 1120-1145 OT Time Calculation (min): 25 min Charges:  OT General Charges $OT Visit: 1 Visit OT Evaluation $OT Eval Moderate Complexity: 1 Mod OT Treatments $Self Care/Home Management : 8-22 mins  Lou Cal, OT Supplemental Rehabilitation Services Pager [phone] Office [phone]   Raymondo Band 04/24/2019, 12:59 PM

## 2019-04-24 NOTE — Progress Notes (Signed)
Updated the patient's spouse, Nathaneil Canary, via phone. All questions answered to his satisfaction.

## 2019-04-24 NOTE — Progress Notes (Signed)
Inpatient Diabetes Program Recommendations  AACE/ADA: New Consensus Statement on Inpatient Glycemic Control (2015)  Target Ranges:  Prepandial:   less than 140 mg/dL      Peak postprandial:   less than 180 mg/dL (1-2 hours)      Critically ill patients:  140 - 180 mg/dL   Lab Results  Component Value Date   GLUCAP 243 (H) 04/24/2019   HGBA1C 7.8 (H) 04/22/2019    Review of Glycemic Control Results for Regina Munoz, Regina Munoz (MRN [id number]) as of 04/24/2019 11:07  Ref. Range 04/23/2019 08:09 04/23/2019 11:54 04/23/2019 16:36 04/23/2019 21:03 04/24/2019 08:00  Glucose-Capillary Latest Ref Range: 70 - 99 mg/dL 230 (H) 291 (H) 161 (H) 190 (H) 243 (H)    Diabetes history: DM 2 Outpatient Diabetes medications: Metformin 500 mg bid Current orders for Inpatient glycemic control:  Lantus 7 units Daily Novolog 0-15 units tid + hs Novolog 4 units tid meal coverage  Decadron 6 mg Q24 hours A1c 7.8% BUN/Creat:  14/0.83 Ensure Enlive bid between meals  Noted Novolog 4 units tid meal coverage and Lantus 7 units added yesterday  Inpatient Diabetes Program Recommendations:  Insulin - Basal: Consider increasing Lantus to 14 units for glucose control.  Thanks,  Tama Headings RN, MSN, BC-ADM Inpatient Diabetes Coordinator Team Pager [phone] (8a-5p)

## 2019-04-25 ENCOUNTER — Inpatient Hospital Stay (HOSPITAL_COMMUNITY): Payer: PPO

## 2019-04-25 DIAGNOSIS — I2609 Other pulmonary embolism with acute cor pulmonale: Secondary | ICD-10-CM

## 2019-04-25 LAB — CBC WITH DIFFERENTIAL/PLATELET
Abs Immature Granulocytes: 0.1 10*3/uL — ABNORMAL HIGH (ref 0.00–0.07)
Basophils Absolute: 0.1 10*3/uL (ref 0.0–0.1)
Basophils Relative: 1 %
Eosinophils Absolute: 0 10*3/uL (ref 0.0–0.5)
Eosinophils Relative: 0 %
HCT: 36.8 % (ref 36.0–46.0)
Hemoglobin: 11.3 g/dL — ABNORMAL LOW (ref 12.0–15.0)
Immature Granulocytes: 1 %
Lymphocytes Relative: 23 %
Lymphs Abs: 1.6 10*3/uL (ref 0.7–4.0)
MCH: 23.8 pg — ABNORMAL LOW (ref 26.0–34.0)
MCHC: 30.7 g/dL (ref 30.0–36.0)
MCV: 77.6 fL — ABNORMAL LOW (ref 80.0–100.0)
Monocytes Absolute: 0.5 10*3/uL (ref 0.1–1.0)
Monocytes Relative: 7 %
Neutro Abs: 4.7 10*3/uL (ref 1.7–7.7)
Neutrophils Relative %: 68 %
Platelets: 136 10*3/uL — ABNORMAL LOW (ref 150–400)
RBC: 4.74 MIL/uL (ref 3.87–5.11)
RDW: 23.9 % — ABNORMAL HIGH (ref 11.5–15.5)
WBC: 7 10*3/uL (ref 4.0–10.5)
nRBC: 0 % (ref 0.0–0.2)

## 2019-04-25 LAB — HEPARIN LEVEL (UNFRACTIONATED)
Heparin Unfractionated: 0.51 IU/mL (ref 0.30–0.70)
Heparin Unfractionated: 0.41 IU/mL (ref 0.30–0.70)

## 2019-04-25 LAB — COMPREHENSIVE METABOLIC PANEL
ALT: 16 U/L (ref 0–44)
AST: 23 U/L (ref 15–41)
Albumin: 2.4 g/dL — ABNORMAL LOW (ref 3.5–5.0)
Alkaline Phosphatase: 75 U/L (ref 38–126)
Anion gap: 7 (ref 5–15)
BUN: 14 mg/dL (ref 8–23)
CO2: 26 mmol/L (ref 22–32)
Calcium: 8.2 mg/dL — ABNORMAL LOW (ref 8.9–10.3)
Chloride: 105 mmol/L (ref 98–111)
Creatinine, Ser: 0.85 mg/dL (ref 0.44–1.00)
GFR calc Af Amer: >60 mL/min (ref >60)
GFR calc non Af Amer: >60 mL/min (ref >60)
Glucose, Bld: 250 mg/dL — ABNORMAL HIGH (ref 70–99)
Potassium: 4.8 mmol/L (ref 3.5–5.1)
Sodium: 138 mmol/L (ref 135–145)
Total Bilirubin: 1 mg/dL (ref 0.3–1.2)
Total Protein: 5.8 g/dL — ABNORMAL LOW (ref 6.5–8.1)

## 2019-04-25 LAB — TROPONIN I (HIGH SENSITIVITY)
Troponin I (High Sensitivity): 32 ng/L — ABNORMAL HIGH (ref <18)
Troponin I (High Sensitivity): 28 ng/L — ABNORMAL HIGH (ref <18)

## 2019-04-25 LAB — GLUCOSE, CAPILLARY
Glucose-Capillary: 211 mg/dL — ABNORMAL HIGH (ref 70–99)
Glucose-Capillary: 165 mg/dL — ABNORMAL HIGH (ref 70–99)
Glucose-Capillary: 138 mg/dL — ABNORMAL HIGH (ref 70–99)
Glucose-Capillary: 239 mg/dL — ABNORMAL HIGH (ref 70–99)

## 2019-04-25 LAB — ECHOCARDIOGRAM LIMITED
Height: 63 in
Weight: 3943.59 oz

## 2019-04-25 LAB — C-REACTIVE PROTEIN: CRP: 1.5 mg/dL — ABNORMAL HIGH (ref <1.0)

## 2019-04-25 LAB — FERRITIN: Ferritin: 33 ng/mL (ref 11–307)

## 2019-04-25 LAB — D-DIMER, QUANTITATIVE: D-Dimer, Quant: 3.73 ug/mL-FEU — ABNORMAL HIGH (ref 0.00–0.50)

## 2019-04-25 LAB — LACTATE DEHYDROGENASE: LDH: 268 U/L — ABNORMAL HIGH (ref 98–192)

## 2019-04-25 NOTE — Progress Notes (Signed)
ANTICOAGULATION CONSULT NOTE - Follow Up Consult  Pharmacy Consult for Heparin Indication: pulmonary embolus  No Known Allergies  Patient Measurements: Height: 5\' 3"  (160 cm) Weight: 246 lb 7.6 oz (111.8 kg) IBW/kg (Calculated) : 52.4 Heparin Dosing Weight: 78 kg  Vital Signs: Temp: 98 F (36.7 C) (12/10 1600) Temp Source: Oral (12/10 1600) Pulse Rate: 98 (12/10 1355)  Labs: Recent Labs    04/23/19 0500 04/24/19 0500 04/25/19 0430 04/25/19 0500 04/25/19 0832 04/25/19 1608  HGB 10.9* 11.6*  --  11.3*  --   --   HCT 34.9* 37.2  --  36.8  --   --   PLT 119* 128*  --  136*  --   --   HEPARINUNFRC  --   --  0.51  --   --  0.41  CREATININE 0.84 0.83  --  0.85  --   --   TROPONINIHS  --   --   --  32* 28*  --     Estimated Creatinine Clearance: 77.3 mL/min (by C-G formula based on SCr of 0.85 mg/dL).   Medical History: Past Medical History:  Diagnosis Date  . Anxiety   . Arthritis   . Asthma   . Complication of anesthesia   . COVID-19   . Diabetes mellitus without complication ([place])   . Dysrhythmia    palpitations ->20 yrs stress test neg nothing since  . GERD (gastroesophageal reflux disease)   . Headache   . PONV (postoperative nausea and vomiting)     Assessment: 68 yr old female admitted on 04/21/19 with difficulty breathing/cough; she had tested positive for COVID on 03/19/19. Pt now with new bilateral PEs with right heart strain. Pharmacy consulted to dose heparin. Pt was on no anticoagulation PTA.   H/H 11.3/36.8, platelets 128>136 (CBC stable)  Confirmatory heparin level drawn ~11 hrs after earlier therapeutic heparin level (0.51 units/ml) today was 0.41 units/ml, which remains within the goal range for this pt. Per RN, no issues with IV or bleeding observed.  Goal of Therapy:  Heparin level 0.3-0.7 units/ml Monitor platelets by anticoagulation protocol: Yes   Plan: Continue heparin infusion at 1250 units/hr Monitor daily heparin level, CBC Monitor  for signs/symptoms of bleeding  Gillermina Hu, PharmD, BCPS, [facility] Clinical Pharmacist 04/25/2019,5:01 PM

## 2019-04-25 NOTE — Progress Notes (Signed)
Notified by pharmacy. Will start heparin bolus & continue drip.

## 2019-04-25 NOTE — TOC Benefit Eligibility Note (Addendum)
Transition of Care [facility]) Benefit Eligibility Note    Patient Details  Name: ARLETTA LUMADUE MRN: [id number] Date of Birth: 1950/10/14   Medication/Dose: Arne Cleveland  2.5 MG BID   ANS   ELIQUIS  5 MG BID  Covered?: Yes  Tier: 3 Drug  Prescription Coverage Preferred Pharmacy: [facility] [place] with Person/Company/Phone Number:: [employer] [facility] [employer] [facility] # [phone] OPT-2  Co-Pay  $45.00  FOR EACH PRESCRIPTION  Prior Approval: No  Deductible: Met  Additional Notes: ELIQUIS  10 MG BID : Crecencio Mc Phone Number: 04/25/2019, 10:45 AM

## 2019-04-25 NOTE — Progress Notes (Signed)
I spoke with Pt's husband, [name], explained that Regina Munoz is on a blood thinner for blood clots in her lungs.  I explained to Cherlyn that we will stick with heparin drip for 3 days as treatment for blood clots.  She said she would be sure to explain that to husband too.

## 2019-04-25 NOTE — Progress Notes (Signed)
  Echocardiogram 2D Echocardiogram has been performed.  Johny Chess 04/25/2019, 12:05 PM

## 2019-04-25 NOTE — Progress Notes (Signed)
PROGRESS NOTE  Regina Munoz YYT:[id number] DOB: 1951-03-22 DOA: 04/21/2019 PCP: [name], DO  HPI/Recap of past 24 hours: 68 year old female with past medical history significant for type 2 diabetes, asthma and anxiety who presented to the ED from home due to difficulty breathing and cough.  Associated with fever chills nausea vomiting loss of taste and diarrhea.  Symptoms started 2 weeks ago and have progressed.  Patient spouse and son tested positive for COVID-19.  Patient tested positive for COVID-19 on 04/15/2019.  Hypoxic on presentation with O2 saturation in the 80s on room air.  Chest x-ray showed patchy infiltrates worse at bases.  Started on remdesivir and Decadron.  Admitted for acute COVID-19 viral pneumonia.  Hospital course complicated by significant hypoxia with ambulation with O2 saturation dropping in the 70s.  CTA chest positive for massive bilateral pulmonary embolism.  Started on heparin drip overnight.  Obtain troponin, mildly elevated, 2D echo is pending.    PCCM contacted, curbsided and discussed with Dr. [name] 12/10.  Hemodynamically stable at this time, recommended to continue heparin drip for 3 days then transition to DOAC.  04/25/19: Seen and examined.  Reports mild hemoptysis yesterday, no recurrence this morning.  Denies chest pain.  No dyspnea while laying in bed.  Awaiting 2D echo to be read by cardiology.      Assessment/Plan: Active Problems:   Pneumonia due to COVID-19 virus   Acute COVID-19 viral pneumonia Initially hypoxemic on presentation with O2 saturation in the 80s on room air.   Covid -19 positive on 04/15/19 Significant drop in oxygen saturation with minimal ambulation >> in the 70's reported by bedside RN Katrinka Blazing on 04/24/2019. CTA chest positive for acute bilateral pulmonary embolism Completed 5 days of remdesivir.   C/w Decadron to complete 10 days  Continue albuterol inhaler and ipratropium inhaler 3 times daily Continue vitamin C,  D3 and zinc Continue incentive spirometer Continue to trend inflammatory markers.  Acute hypoxic respiratory failure multifactorial secondary to acute COVID-19 viral pneumonia and acute bilateral pulmonary embolism Management as stated above.  Newly diagnosed acute massive bilateral pulmonary embolism with right heart strain Independently reviewed CT a chest which shows pulmonary embolism affecting both pulmonary arteries extending into all proximal lobar branches. 2D echo completed, awaiting results to be read by cardiology PCCM contacted, curbsided and discussed with Dr. [name] 12/10.  Hemodynamically stable at this time, recommended to continue heparin drip for 3 days then transition to DOAC. Case manager contacted to check Eliquis insurance coverage.  Appreciate assistance.  Elevated troponin likely demand ischemia from acute massive bilateral PE. Troponin is peaked at 32 then trended down. No anginal symptoms at the time of this visit.  Resolved sinus tachycardia likely secondary to acute PE  Prolonged QTC Avoid QTC prolonging agents Monitor with twelve-lead EKG  Type 2 diabetes with hyperglycemia Hemoglobin A1c 7.8 Uncontrolled hyperglycemia exacerbated by Decadron Continue Lantus 7 units daily Continue NovoLog 7 units 3 times daily. Continue insulin sliding scale  Chronic anxiety/depression Continue home medication  GERD Continue home medication  Obesity BMI 41 Recommend weight loss outpatient with regular physical activity and healthy diet daily once healed from COVID-19 viral pneumonia. PT OT to assess.   DVT prophylaxis:  Heparin drip Code Status: Full code Family Communication:  None at bedside.  Disposition Plan:  Likely home in the next 72 hours.  Consults called:  Curbsided with PCCM Dr. [name] on 04/25/2019.   Objective: Vitals:   04/25/19 0034 04/25/19 0326 04/25/19 0332 04/25/19  0729  BP: 130/63  126/70   Pulse: 89 82 82 80  Resp: (!) 21 20 (!)  23 (!) 22  Temp: 98.8 F (37.1 C)  98.4 F (36.9 C)   TempSrc: Oral  Oral   SpO2: 91% 94% 94% 95%  Weight:  111.8 kg    Height:        Intake/Output Summary (Last 24 hours) at 04/25/2019 1311 Last data filed at 04/25/2019 0830 Gross per 24 hour  Intake 200 ml  Output 400 ml  Net -200 ml   Filed Weights   04/21/19 1715 04/22/19 0604 04/25/19 0326  Weight: 109.8 kg 107 kg 111.8 kg    Exam:   General: 68 y.o. year-old female obese in no acute distress.  Alert and oriented x3.  Cardiovascular: Regular rate and rhythm no rubs or gallops.  Respiratory: Mild rales at bases no wheezing noted.  Poor respiratory effort.    Abdomen: Obese nontender normal bowel sounds present. Musculoskeletal: No lower extremity edema.   Psychiatry: Mood is appropriate for condition and setting.  Data Reviewed: CBC: Recent Labs  Lab 04/21/19 1722 04/23/19 0500 04/24/19 0500 04/25/19 0500  WBC 7.1 8.9 7.9 7.0  NEUTROABS 5.1 6.9 6.0 4.7  HGB 13.3 10.9* 11.6* 11.3*  HCT 44.5 34.9* 37.2 36.8  MCV 80.3 78.8* 77.3* 77.6*  PLT 134* 119* 128* 136*   Basic Metabolic Panel: Recent Labs  Lab 04/21/19 1722 04/23/19 0500 04/24/19 0500 04/25/19 0500  NA 138 139 139 138  K 3.8 4.1 4.8 4.8  CL 99 106 103 105  CO2 24 23 25 26   GLUCOSE 221* 245* 275* 250*  BUN 12 18 14 14   CREATININE 0.84 0.84 0.83 0.85  CALCIUM 8.7* 8.2* 8.4* 8.2*   GFR: Estimated Creatinine Clearance: 77.3 mL/min (by C-G formula based on SCr of 0.85 mg/dL). Liver Function Tests: Recent Labs  Lab 04/21/19 1722 04/23/19 0500 04/24/19 0500 04/25/19 0500  AST 25 17 22 23   ALT 17 13 13 16   ALKPHOS 84 64 75 75  BILITOT 1.2 0.5 0.8 1.0  PROT 7.5 6.1* 6.2* 5.8*  ALBUMIN 3.1* 2.4* 2.3* 2.4*   No results for input(s): LIPASE, AMYLASE in the last 168 hours. No results for input(s): AMMONIA in the last 168 hours. Coagulation Profile: No results for input(s): INR, PROTIME in the last 168 hours. Cardiac Enzymes: No  results for input(s): CKTOTAL, CKMB, CKMBINDEX, TROPONINI in the last 168 hours. BNP (last 3 results) No results for input(s): PROBNP in the last 8760 hours. HbA1C: No results for input(s): HGBA1C in the last 72 hours. CBG: Recent Labs  Lab 04/24/19 1144 04/24/19 1629 04/24/19 2124 04/25/19 0806 04/25/19 1205  GLUCAP 221* 267* 181* 211* 165*   Lipid Profile: No results for input(s): CHOL, HDL, LDLCALC, TRIG, CHOLHDL, LDLDIRECT in the last 72 hours. Thyroid Function Tests: No results for input(s): TSH, T4TOTAL, FREET4, T3FREE, THYROIDAB in the last 72 hours. Anemia Panel: Recent Labs    04/24/19 0500 04/25/19 0500  FERRITIN 36 33   Urine analysis: No results found for: COLORURINE, APPEARANCEUR, LABSPEC, PHURINE, GLUCOSEU, HGBUR, BILIRUBINUR, KETONESUR, PROTEINUR, UROBILINOGEN, NITRITE, LEUKOCYTESUR Sepsis Labs: @LABRCNTIP (procalcitonin:4,lacticidven:4)  ) Recent Results (from the past 240 hour(s))  Blood Culture (routine x 2)     Status: None (Preliminary result)   Collection Time: 04/21/19  5:38 PM   Specimen: BLOOD LEFT ARM  Result Value Ref Range Status   Specimen Description BLOOD LEFT ARM  Final   Special Requests   Final  BOTTLES DRAWN AEROBIC AND ANAEROBIC Blood Culture adequate volume   Culture   Final    NO GROWTH 4 DAYS Performed at [facility], [address], [place], [place] [address]    Report Status PENDING  Incomplete  Blood Culture (routine x 2)     Status: None (Preliminary result)   Collection Time: 04/21/19  5:38 PM   Specimen: Right Antecubital; Blood  Result Value Ref Range Status   Specimen Description RIGHT ANTECUBITAL  Final   Special Requests   Final    BOTTLES DRAWN AEROBIC AND ANAEROBIC Blood Culture adequate volume   Culture   Final    NO GROWTH 4 DAYS Performed at [facility], [address], [place], [place] [address]    Report Status PENDING  Incomplete  MRSA PCR Screening     Status: None   Collection Time: 04/23/19 12:35 PM     Specimen: Nasal Mucosa; Nasopharyngeal  Result Value Ref Range Status   MRSA by PCR NEGATIVE NEGATIVE Final    Comment:        The GeneXpert MRSA Assay (FDA approved for NASAL specimens only), is one component of a comprehensive MRSA colonization surveillance program. It is not intended to diagnose MRSA infection nor to guide or monitor treatment for MRSA infections. Performed at [facility], [address] [place],  [address]       Studies: CT ANGIO CHEST PE W OR WO CONTRAST  Result Date: 04/24/2019 CLINICAL DATA:  Hypoxemia, cough and shortness of breath. COVID-19 pneumonia. EXAM: CT ANGIOGRAPHY CHEST WITH CONTRAST TECHNIQUE: Multidetector CT imaging of the chest was performed using the standard protocol during bolus administration of intravenous contrast. Multiplanar CT image reconstructions and MIPs were obtained to evaluate the vascular anatomy. CONTRAST:  5mL OMNIPAQUE IOHEXOL 350 MG/ML SOLN COMPARISON:  None. FINDINGS: Cardiovascular: --Pulmonary arteries: Contrast injection is sufficient to demonstrate satisfactory opacification of the pulmonary arteries to the segmental level, with attenuation of at least 200 HU at the main pulmonary artery. There is a large pulmonary embolus clot burden within both the right and left main pulmonary arteries extending into all proximal lobar branches. There is evidence of right heart strain with an RV to LV ratio of 1.65. the main pulmonary artery is within normal limits for size. --Aorta: Satisfactory opacification of the thoracic aorta. No aortic dissection or other acute aortic syndrome. Conventional 3 vessel aortic branching pattern. There is no aortic atherosclerosis. --Heart: Normal size. No pericardial effusion. Mediastinum/Nodes: No mediastinal, hilar or axillary lymphadenopathy. The visualized thyroid and thoracic esophageal course are unremarkable. Lungs/Pleura: Mild ground-glass opacity without focal consolidation. Upper  Abdomen: Contrast bolus timing is not optimized for evaluation of the abdominal organs. There are stones within the gallbladder. Musculoskeletal: No chest wall abnormality. No bony spinal canal stenosis. Review of the MIP images confirms the above findings. IMPRESSION: 1. Massive pulmonary embolus within both main pulmonary arteries extending into all proximal lobar branches with CT evidence of right heart strain (RV/LV Ratio = 1.65) consistent with at least submassive (intermediate risk) PE. The presence of right heart strain has been associated with an increased risk of morbidity and mortality. Please activate Code PE by paging [phone]. 2. Multifocal pulmonary ground-glass opacity without specific evidence of infection 3. Cholelithiasis. Critical Value/emergent results were called by telephone at the time of interpretation on 04/24/2019 at 9:38 pm to provider K. Baltazar Najjar, who verbally acknowledged these results. Electronically Signed   By: Ulyses Jarred M.D.   On: 04/24/2019 21:38  DG CHEST PORT 1 VIEW  Result Date: 04/24/2019 CLINICAL DATA:  68 year old with COVID-19 pneumonia, now with worsening hypoxia. EXAM: PORTABLE CHEST 1 VIEW COMPARISON:  04/21/2019. FINDINGS: Cardiac silhouette upper normal in size to mildly enlarged, unchanged. Improved aeration in the lung bases, with residual mild streaky opacities at the LEFT base. No new pulmonary parenchymal abnormalities. Normal pulmonary vascularity. No visible pleural effusions. IMPRESSION: 1. Improved aeration in the lung bases, with residual mild streaky atelectasis and/or bronchopneumonia at the LEFT base. 2. No new abnormalities. Electronically Signed   By: Hulan Saas M.D.   On: 04/24/2019 17:07    Scheduled Meds:  albuterol  2 puff Inhalation Q8H   aspirin EC  81 mg Oral Daily   buPROPion  100 mg Oral Daily   cholecalciferol  1,000 Units Oral Daily   dexamethasone (DECADRON) injection  6 mg Intravenous Q24H    dextromethorphan-guaiFENesin  1 tablet Oral BID   feeding supplement (ENSURE ENLIVE)  237 mL Oral BID BM   insulin aspart  0-15 Units Subcutaneous TID WC   insulin aspart  0-5 Units Subcutaneous QHS   insulin aspart  7 Units Subcutaneous TID WC   insulin glargine  7 Units Subcutaneous Daily   ipratropium  2 puff Inhalation Q8H   pantoprazole  40 mg Oral Daily   vitamin C  500 mg Oral Daily   zinc sulfate  220 mg Oral Daily    Continuous Infusions:  heparin 1,250 Units/hr (04/24/19 2255)     LOS: 4 days     Darlin Drop, MD [facility] Pager [phone]  If 7PM-7AM, please contact night-coverage [email or web address] Password [email or web address] 04/25/2019, 1:11 PM

## 2019-04-25 NOTE — Care Management (Signed)
Benefit check submitted for Eliquis, patient will need Eliquis card prior to discharge if started on it.

## 2019-04-25 NOTE — Progress Notes (Signed)
ANTICOAGULATION CONSULT NOTE -follow up  Pharmacy Consult for Heparin Indication: pulmonary embolus  No Known Allergies  Patient Measurements: Height: 5\' 3"  (160 cm) Weight: 246 lb 7.6 oz (111.8 kg) IBW/kg (Calculated) : 52.4 Heparin Dosing Weight: 78 kg  Vital Signs: Temp: 98.4 F (36.9 C) (12/10 0332) Temp Source: Oral (12/10 0332) BP: 126/70 (12/10 0332) Pulse Rate: 80 (12/10 0729)  Labs: Recent Labs    04/23/19 0500 04/24/19 0500 04/25/19 0430 04/25/19 0500  HGB 10.9* 11.6*  --  11.3*  HCT 34.9* 37.2  --  36.8  PLT 119* 128*  --  136*  HEPARINUNFRC  --   --  0.51  --   CREATININE 0.84 0.83  --  0.85  TROPONINIHS  --   --   --  32*    Estimated Creatinine Clearance: 77.3 mL/min (by C-G formula based on SCr of 0.85 mg/dL).   Medical History: Past Medical History:  Diagnosis Date  . Anxiety   . Arthritis   . Asthma   . Complication of anesthesia   . COVID-19   . Diabetes mellitus without complication ([place])   . Dysrhythmia    palpitations ->20 yrs stress test neg nothing since  . GERD (gastroesophageal reflux disease)   . Headache   . PONV (postoperative nausea and vomiting)     Assessment: 68 yr old female admitted on 04/21/19 with difficulty breathing/cough; she had tested positive for COVID on 03/19/19. Pt now with new bilateral PEs with right heart strain. Pharmacy consulted to dose heparin. Pt was on no anticoagulation PTA.  Heparin infusion started last night.  The 6 hr heparin level = 0.51 on heparin 1250 units/hr, level is therapeutic. No bleeding noted.  H/H 11.3/36.8, platelets 128>136 (CBC stable)  Goal of Therapy:  Heparin level 0.3-0.7 units/ml Monitor platelets by anticoagulation protocol: Yes   Plan: Continue Heparin infusion at 1250 units/hr Check 8-hr HL Monitor daily heparin level, CBC Monitor for signs/symptoms of bleeding  Nicole Cella, RPh Clinical Pharmacist Please check AMION for all [place] phone numbers After 10:00 PM,  call [place] [phone] 04/25/2019,9:42 AM

## 2019-04-25 NOTE — Progress Notes (Signed)
PT Cancellation Note  Patient Details Name: Regina Munoz MRN: [id number] DOB: Nov 05, 1950   Cancelled Treatment:    Reason Eval/Treat Not Completed: (P) Medical issues which prohibited therapy Pt found to have bilateral PE. Heparin started 2255 04/24/19. PT will follow back tomorrow for treatment.   Hatice Bubel B. Migdalia Dk PT, DPT Acute Rehabilitation Services Pager [phone] Office [phone]    [place] 04/25/2019, 1:17 PM

## 2019-04-26 LAB — CBC WITH DIFFERENTIAL/PLATELET
Abs Immature Granulocytes: 0.19 10*3/uL — ABNORMAL HIGH (ref 0.00–0.07)
Basophils Absolute: 0.1 10*3/uL (ref 0.0–0.1)
Basophils Relative: 1 %
Eosinophils Absolute: 0 10*3/uL (ref 0.0–0.5)
Eosinophils Relative: 0 %
HCT: 38.7 % (ref 36.0–46.0)
Hemoglobin: 12 g/dL (ref 12.0–15.0)
Immature Granulocytes: 2 %
Lymphocytes Relative: 15 %
Lymphs Abs: 1.2 10*3/uL (ref 0.7–4.0)
MCH: 24.3 pg — ABNORMAL LOW (ref 26.0–34.0)
MCHC: 31 g/dL (ref 30.0–36.0)
MCV: 78.5 fL — ABNORMAL LOW (ref 80.0–100.0)
Monocytes Absolute: 0.4 10*3/uL (ref 0.1–1.0)
Monocytes Relative: 5 %
Neutro Abs: 6.1 10*3/uL (ref 1.7–7.7)
Neutrophils Relative %: 77 %
Platelets: 147 10*3/uL — ABNORMAL LOW (ref 150–400)
RBC: 4.93 MIL/uL (ref 3.87–5.11)
RDW: 23.9 % — ABNORMAL HIGH (ref 11.5–15.5)
WBC: 8 10*3/uL (ref 4.0–10.5)
nRBC: 0 % (ref 0.0–0.2)

## 2019-04-26 LAB — CULTURE, BLOOD (ROUTINE X 2)
Culture: NO GROWTH
Culture: NO GROWTH
Special Requests: ADEQUATE
Special Requests: ADEQUATE

## 2019-04-26 LAB — GLUCOSE, CAPILLARY
Glucose-Capillary: 228 mg/dL — ABNORMAL HIGH (ref 70–99)
Glucose-Capillary: 153 mg/dL — ABNORMAL HIGH (ref 70–99)
Glucose-Capillary: 118 mg/dL — ABNORMAL HIGH (ref 70–99)
Glucose-Capillary: 262 mg/dL — ABNORMAL HIGH (ref 70–99)

## 2019-04-26 LAB — COMPREHENSIVE METABOLIC PANEL
ALT: 19 U/L (ref 0–44)
AST: 35 U/L (ref 15–41)
Albumin: 2.6 g/dL — ABNORMAL LOW (ref 3.5–5.0)
Alkaline Phosphatase: 86 U/L (ref 38–126)
Anion gap: 11 (ref 5–15)
BUN: 15 mg/dL (ref 8–23)
CO2: 23 mmol/L (ref 22–32)
Calcium: 8.9 mg/dL (ref 8.9–10.3)
Chloride: 102 mmol/L (ref 98–111)
Creatinine, Ser: 0.83 mg/dL (ref 0.44–1.00)
GFR calc Af Amer: >60 mL/min (ref >60)
GFR calc non Af Amer: >60 mL/min (ref >60)
Glucose, Bld: 283 mg/dL — ABNORMAL HIGH (ref 70–99)
Potassium: 5.5 mmol/L — ABNORMAL HIGH (ref 3.5–5.1)
Sodium: 136 mmol/L (ref 135–145)
Total Bilirubin: 0.6 mg/dL (ref 0.3–1.2)
Total Protein: 6.7 g/dL (ref 6.5–8.1)

## 2019-04-26 LAB — LACTATE DEHYDROGENASE: LDH: 372 U/L — ABNORMAL HIGH (ref 98–192)

## 2019-04-26 LAB — FERRITIN: Ferritin: 44 ng/mL (ref 11–307)

## 2019-04-26 LAB — HEPARIN LEVEL (UNFRACTIONATED): Heparin Unfractionated: 0.49 IU/mL (ref 0.30–0.70)

## 2019-04-26 LAB — C-REACTIVE PROTEIN: CRP: 1.2 mg/dL — ABNORMAL HIGH (ref <1.0)

## 2019-04-26 LAB — D-DIMER, QUANTITATIVE: D-Dimer, Quant: 3.44 ug/mL-FEU — ABNORMAL HIGH (ref 0.00–0.50)

## 2019-04-26 MED ORDER — INSULIN GLARGINE 100 UNIT/ML ~~LOC~~ SOLN
10.0000 [IU] | Freq: Every day | SUBCUTANEOUS | Status: DC
  Administered 2019-04-27 – 2019-05-02 (×6): 10 [IU] via SUBCUTANEOUS
  Filled 2019-04-26 (×6): qty 0.1

## 2019-04-26 MED ORDER — INSULIN GLARGINE 100 UNIT/ML ~~LOC~~ SOLN
3.0000 [IU] | Freq: Once | SUBCUTANEOUS | Status: AC
  Administered 2019-04-26: 3 [IU] via SUBCUTANEOUS
  Filled 2019-04-26: qty 0.03

## 2019-04-26 NOTE — Progress Notes (Signed)
Updated patient's spouse Mr Nathaneil Canary via phone. All questions answered to his satisfaction.

## 2019-04-26 NOTE — TOC Initial Note (Addendum)
Transition of Care [facility]) - Initial/Assessment Note    Patient Details  Name: Regina Munoz MRN: [id number] Date of Birth: 09-18-1950  [facility] of [facility]) CM/SW Contact:    Maryclare Labrador, RN Phone Number: 04/26/2019, 3:13 PM  Clinical Narrative:     PTA independent from home with spouse.  Pt confirms she has PCP and denied barriers with paying for discharge medications.  Pt will discharge home on Eliquis   - benefit check results shared with pt  - pay can afford copay.  CM printed free 30 day Eliquis card to unit Oncologist to place form on shadow chart - CM spoke with bedside nurse and requested form be  hand delivered to pt today (pt is aware that the form will provide her with free 30 day supply).   Pt is also in agreement with home oxygen as ordered - CM provided pt choice of DME agency - pt chose [facility]-  tentative referral accepted pending pulm ambulatory test.  Ambulatory Test ordered.  Pt will transport home via private vehicle              Expected Discharge Plan: Home/Self Care     Patient Goals and CMS Choice Patient states their goals for this hospitalization and ongoing recovery are:: Pt states she is ready to get home and feel better CMS Medicare.gov Compare Post Acute Care list provided to:: Patient Choice offered to / list presented to : Patient  Expected Discharge Plan and Services Expected Discharge Plan: Home/Self Care     Post Acute Care Choice: Durable Medical Equipment Living arrangements for the past 2 months: Single Family Home                 DME Arranged: Oxygen DME Agency: [place] Date DME Agency Contacted: 04/26/19 Time DME Agency Contacted: 86 Representative spoke with at DME Agency: Magda Paganini            Prior Living Arrangements/Services Living arrangements for the past 2 months: [place] Lives with:: Spouse   Do you feel safe going back to the place where you live?: Yes      Need for Family  Participation in Patient Care: Yes (Comment) Care giver support system in place?: Yes (comment)   Criminal Activity/Legal Involvement Pertinent to Current Situation/Hospitalization: No - Comment as needed  Activities of Daily Living Home Assistive Devices/Equipment: None ADL Screening (condition at time of admission) Patient's cognitive ability adequate to safely complete daily activities?: Yes Is the patient deaf or have difficulty hearing?: No Does the patient have difficulty seeing, even when wearing glasses/contacts?: No Does the patient have difficulty concentrating, remembering, or making decisions?: No Patient able to express need for assistance with ADLs?: Yes Does the patient have difficulty dressing or bathing?: Yes Independently performs ADLs?: No Grooming: Needs assistance Is this a change from baseline?: Change from baseline, expected to last <3 days Bathing: Needs assistance Is this a change from baseline?: Change from baseline, expected to last <3 days Does the patient have difficulty walking or climbing stairs?: Yes Weakness of Legs: Both Weakness of Arms/Hands: Both  Permission Sought/Granted   Permission granted to share information with : Yes, Verbal Permission Granted     Permission granted to share info w AGENCY: [facility]        Emotional Assessment   Attitude/Demeanor/Rapport: Self-Confident, Charismatic, Gracious Affect (typically observed): Accepting Orientation: : Oriented to Self, Oriented to Place, Oriented to  Time, Oriented to Situation  Admission diagnosis:  Hypoxia [R09.02] COVID-19 virus infection [U07.1] Pneumonia due to COVID-19 virus [U07.1, J12.89] Patient Active Problem List   Diagnosis Date Noted  . Pneumonia due to COVID-19 virus 04/21/2019  . Neck pain 02/17/2016   PCP:  [name], DO Pharmacy:   [facility] - [place] - [address] [place] [address] Phone: [phone] Fax:  [phone]  [facility] [address] [place] - [address] [place] [address] [place] [address] Phone: [phone] Fax: [phone]     Social Determinants of Health (SDOH) Interventions    Readmission Risk Interventions No flowsheet data found.

## 2019-04-26 NOTE — Progress Notes (Addendum)
Physical Therapy Treatment Patient Details Name: Regina Munoz MRN: [id number] DOB: 05-23-1950 Today's Date: 04/26/2019    History of Present Illness 68 y.o. female with medical history significant for diabetes mellitus, asthma and anxiety.  Patient presented to the ED with complaints of difficulty breathing, cough, fever chills nausea vomiting loss of taste and diarrhea. Patient tested positive for COVID-19 11/30. Admitted 04/21/19 for treatment of COVID PNA    PT Comments    Pt is making excellent progress towards her goals despite onset of bilateral PE. Pt is limited in safe mobility by O2 desaturation (see General Comments) and generalized weakness. Pt is mod I for bed mobility, supervision for transfers from bed and toilet, and min guard for ambulation of 250 feet with RW and 2x standing rest breaks. Given pt's minor set back PT recommending HHPT at d/c. PT will continue to follow acutely.      Follow Up Recommendations  Supervision for mobility/OOB;Home health PT     Equipment Recommendations  None recommended by PT       Precautions / Restrictions Precautions Precautions: None Restrictions Weight Bearing Restrictions: No    Mobility  Bed Mobility Overal bed mobility: Modified Independent                Transfers Overall transfer level: Needs assistance Equipment used: Rolling walker (2 wheeled) Transfers: Sit to/from Stand Sit to Stand: Supervision         General transfer comment: supervision for safety   Ambulation/Gait Ambulation/Gait assistance: Min guard Gait Distance (Feet): 250 Feet Assistive device: Rolling walker (2 wheeled) Gait Pattern/deviations: Step-to pattern;Decreased step length - right;Decreased step length - left Gait velocity: slowed Gait velocity interpretation: <1.8 ft/sec, indicate of risk for recurrent falls General Gait Details: min guard for safety with RW, required 2x standing rest breaks 1x due to drop in SaO2 second due to  increased HR 123bpm/fatigue         Balance Overall balance assessment: Needs assistance Sitting-balance support: Feet supported;No upper extremity supported Sitting balance-Leahy Scale: Normal     Standing balance support: No upper extremity supported;During functional activity Standing balance-Leahy Scale: Good                              Cognition Arousal/Alertness: Awake/alert Behavior During Therapy: WFL for tasks assessed/performed Overall Cognitive Status: Within Functional Limits for tasks assessed                                           General Comments General comments (skin integrity, edema, etc.): Pt on 3L O2 via [place] at rest SaO2 94%O2 with ambulation SaO2 dropped to 88%O2, with standing rest break and cue for pursed lipped breathing SaO rebounded to 92%O2      Pertinent Vitals/Pain Pain Assessment: No/denies pain           PT Goals (current goals can now be found in the care plan section) Acute Rehab PT Goals Patient Stated Goal: get her strength back  PT Goal Formulation: With patient Potential to Achieve Goals: Good Progress towards PT goals: Progressing toward goals    Frequency    Min 3X/week      PT Plan Discharge plan needs to be updated       AM-PAC PT "6 Clicks" Mobility   Outcome Measure  Help needed turning from  your back to your side while in a flat bed without using bedrails?: None Help needed moving from lying on your back to sitting on the side of a flat bed without using bedrails?: None Help needed moving to and from a bed to a chair (including a wheelchair)?: None Help needed standing up from a chair using your arms (e.g., wheelchair or bedside chair)?: None Help needed to walk in hospital room?: A Little Help needed climbing 3-5 steps with a railing? : A Little 6 Click Score: 22    End of Session Equipment Utilized During Treatment: Gait belt;Oxygen Activity Tolerance: Patient tolerated  treatment well Patient left: with call bell/phone within reach;in bed Nurse Communication: Mobility status PT Visit Diagnosis: Unsteadiness on feet (R26.81);Other abnormalities of gait and mobility (R26.89);Muscle weakness (generalized) (M62.81)     Time: 7517-0017 PT Time Calculation (min) (ACUTE ONLY): 31 min  Charges:  $Gait Training: 23-37 mins                     Baldemar Dady B. Migdalia Dk PT, DPT Acute Rehabilitation Services Pager [phone] Office [phone]    [place] 04/26/2019, 4:32 PM

## 2019-04-26 NOTE — Progress Notes (Signed)
Inpatient Diabetes Program Recommendations  AACE/ADA: New Consensus Statement on Inpatient Glycemic Control (2015)  Target Ranges:  Prepandial:   less than 140 mg/dL      Peak postprandial:   less than 180 mg/dL (1-2 hours)      Critically ill patients:  140 - 180 mg/dL   Lab Results  Component Value Date   GLUCAP 228 (H) 04/26/2019   HGBA1C 7.8 (H) 04/22/2019    Review of Glycemic Control Results for Regina Munoz, Regina Munoz (MRN [id number]) as of 04/26/2019 11:42  Ref. Range 04/25/2019 08:06 04/25/2019 12:05 04/25/2019 16:42 04/25/2019 21:55 04/26/2019 08:05  Glucose-Capillary Latest Ref Range: 70 - 99 mg/dL 211 (H) 165 (H) 138 (H) 239 (H) 228 (H)   Diabetes history: DM 2 Outpatient Diabetes medications: Metformin 500 mg bid Current orders for Inpatient glycemic control:  Lantus 7 units Daily Novolog 0-15 units tid + hs Novolog 4 units tid meal coverage  Decadron 6 mg Q24 hours A1c 7.8% BUN/Creat:  14/0.83 Ensure Enlive bid between meals  Inpatient Diabetes Program Recommendations:  Insulin - Basal: Consider increasing Lantus to 10 units for glucose control.  Thanks,  Tama Headings RN, MSN, BC-ADM Inpatient Diabetes Coordinator Team Pager [phone] (8a-5p)

## 2019-04-26 NOTE — Progress Notes (Signed)
ANTICOAGULATION CONSULT NOTE - Follow Up Consult  Pharmacy Consult for Heparin Indication: pulmonary embolus  No Known Allergies  Patient Measurements: Height: 5\' 3"  (160 cm) Weight: 246 lb 7.6 oz (111.8 kg) IBW/kg (Calculated) : 52.4 Heparin Dosing Weight: 78 kg  Vital Signs: Temp: 97.6 F (36.4 C) (12/11 0535) Temp Source: Oral (12/11 0535) BP: 123/63 (12/11 0535) Pulse Rate: 89 (12/11 0535)  Labs: Recent Labs    04/24/19 0500 04/25/19 0430 04/25/19 0500 04/25/19 0832 04/25/19 1608 04/26/19 0500  HGB 11.6*  --  11.3*  --   --  12.0  HCT 37.2  --  36.8  --   --  38.7  PLT 128*  --  136*  --   --  147*  HEPARINUNFRC  --  0.51  --   --  0.41 0.49  CREATININE 0.83  --  0.85  --   --  0.83  TROPONINIHS  --   --  32* 28*  --   --     Estimated Creatinine Clearance: 79.1 mL/min (by C-G formula based on SCr of 0.83 mg/dL).   Medical History: Past Medical History:  Diagnosis Date  . Anxiety   . Arthritis   . Asthma   . Complication of anesthesia   . COVID-19   . Diabetes mellitus without complication ([place])   . Dysrhythmia    palpitations ->20 yrs stress test neg nothing since  . GERD (gastroesophageal reflux disease)   . Headache   . PONV (postoperative nausea and vomiting)     Assessment: 68 yr old female admitted on 04/21/19 with difficulty breathing/cough; she had tested positive for COVID on 03/19/19. Pt now with new bilateral PEs with right heart strain 04/24/19. Pharmacy consulted to dose heparin. Pt was on no anticoagulation PTA.   H/H 12.0/38.7, platelets [phone]. No bleeding reported.  Today's heparin level is 0.49, remains therapeutic on IV heparin 1250 units/hr.  Goal of Therapy:  Heparin level 0.3-0.7 units/ml Monitor platelets by anticoagulation protocol: Yes   Plan: Continue heparin infusion at 1250 units/hr Monitor daily heparin level, CBC Monitor for signs/symptoms of bleeding Benefit check submitted for Eliquis per CM pending  Nicole Cella, RPh Clinical Pharmacist [phone] Please check AMION for all [place] phone numbers After 10:00 PM, call [place] [phone] 04/26/2019,10:04 AM

## 2019-04-26 NOTE — Progress Notes (Signed)
PROGRESS NOTE  Regina Munoz NID:[id number] DOB: December 26, 1950 DOA: 04/21/2019 PCP: [name], DO  HPI/Recap of past 54 hours: 68 year old female with past medical history significant for type 2 diabetes, asthma and anxiety who presented to the ED from home due to difficulty breathing and cough.  Associated with fever chills nausea vomiting loss of taste and diarrhea.  Symptoms started 2 weeks ago and have progressed.  Patient spouse and son tested positive for COVID-19.  Patient tested positive for COVID-19 on 04/15/2019.  Hypoxic on presentation with O2 saturation in the 80s on room air.  Chest x-ray showed patchy infiltrates worse at bases.  Started on remdesivir and Decadron.  Admitted for acute COVID-19 viral pneumonia.  Hospital course complicated by significant hypoxia with ambulation with O2 saturation dropping in the 70s.  CTA chest positive for massive bilateral pulmonary embolism.  Started on heparin drip overnight.  Obtain troponin, mildly elevated, 2D echo is pending.    PCCM contacted, curbsided and discussed with Dr. [name] 12/10.  Hemodynamically stable at this time, recommended to continue heparin drip for 3 days then transition to DOAC.  04/26/19: Seen and examined.  Denies chest pain or recurrent hemoptysis.  We will continue heparin drip for total of 3 days.  Home O2 evaluation ordered for DC planning.  Will start Eliquis when off heparin drip.    Assessment/Plan: Active Problems:   Pneumonia due to COVID-19 virus   Acute COVID-19 viral pneumonia Initially hypoxemic on presentation with O2 saturation in the 80s on room air.   Covid -19 positive on 04/15/19 Significant drop in oxygen saturation with minimal ambulation >> in the 70's reported by bedside RN Katrinka Blazing on 04/24/2019. CTA chest positive for acute massive bilateral pulmonary embolism Completed 5 days of remdesivir.   C/w Decadron to complete 10 days  Continue albuterol inhaler and ipratropium inhaler 3  times daily Continue vitamin C, D3 and zinc Continue incentive spirometer Continue to trend inflammatory markers.  Acute hypoxic respiratory failure multifactorial secondary to acute COVID-19 viral pneumonia and acute bilateral pulmonary embolism Management as stated above. Home O2 evaluation for DC planning  Newly diagnosed acute massive bilateral pulmonary embolism with right heart strain Independently reviewed CT a chest which shows pulmonary embolism affecting both pulmonary arteries extending into all proximal lobar branches. 2D echo completed, awaiting results to be read by cardiology PCCM contacted, curbsided and discussed with Dr. [name] 12/10.  Hemodynamically stable at this time, recommended to continue heparin drip for 3 days then transition to DOAC.  Day number 2 out of 3. Switch to Eliquis when off heparin drip  Elevated troponin likely demand ischemia from acute massive bilateral PE. Troponin is peaked at 32 then trended down. No anginal symptoms at the time of this visit.  Resolved sinus tachycardia likely secondary to acute PE  Prolonged QTC Avoid QTC prolonging agents Monitor with twelve-lead EKG  Type 2 diabetes with hyperglycemia Hemoglobin A1c 7.8 Uncontrolled hyperglycemia exacerbated by Decadron Continue Lantus 7 units daily Continue NovoLog 7 units 3 times daily. Continue insulin sliding scale  Chronic anxiety/depression Continue home medication  GERD Continue home medication  Obesity BMI 41 Recommend weight loss outpatient with regular physical activity and healthy diet daily once healed from COVID-19 viral pneumonia. PT OT to assess.   DVT prophylaxis:  Heparin drip Code Status: Full code Family Communication:  None at bedside.  Disposition Plan:  Likely home with home health services after completion of heparin drip and starting Eliquis.  Will obtain home  O2 evaluation on 04/26/2023 DC planning.  Consults called:  Curbsided with PCCM Dr.  [name] on 04/25/2019.   Objective: Vitals:   04/25/19 1711 04/25/19 2000 04/25/19 2151 04/26/19 0535  BP:  117/61 127/67 123/63  Pulse: 89 88 98 89  Resp: 20 (!) 29 (!) 25 20  Temp:   97.7 F (36.5 C) 97.6 F (36.4 C)  TempSrc:   Oral Oral  SpO2: 92% 95% 94% 95%  Weight:      Height:        Intake/Output Summary (Last 24 hours) at 04/26/2019 1309 Last data filed at 04/26/2019 0533 Gross per 24 hour  Intake 497.5 ml  Output 1 ml  Net 496.5 ml   Filed Weights   04/21/19 1715 04/22/19 0604 04/25/19 0326  Weight: 109.8 kg 107 kg 111.8 kg    Exam:  . General: 68 y.o. year-old female obese in no acute distress.  Alert and oriented x3.   . Cardiovascular: Regular rate and rhythm with no rubs or gallops.  Respiratory: Mild rales at bases no wheezing noted.   Abdomen: Obese nontender normal bowel sounds present.   Musculoskeletal: No lower extremity edema.  2 out of 4 pulses in all 4 extremities.  Nontender on palpation.   Psychiatry: Mood is appropriate for condition and setting.  Data Reviewed: CBC: Recent Labs  Lab 04/21/19 1722 04/23/19 0500 04/24/19 0500 04/25/19 0500 04/26/19 0500  WBC 7.1 8.9 7.9 7.0 8.0  NEUTROABS 5.1 6.9 6.0 4.7 6.1  HGB 13.3 10.9* 11.6* 11.3* 12.0  HCT 44.5 34.9* 37.2 36.8 38.7  MCV 80.3 78.8* 77.3* 77.6* 78.5*  PLT 134* 119* 128* 136* 294*   Basic Metabolic Panel: Recent Labs  Lab 04/21/19 1722 04/23/19 0500 04/24/19 0500 04/25/19 0500 04/26/19 0500  NA 138 139 139 138 136  K 3.8 4.1 4.8 4.8 5.5*  CL 99 106 103 105 102  CO2 24 23 25 26 23   GLUCOSE 221* 245* 275* 250* 283*  BUN 12 18 14 14 15   CREATININE 0.84 0.84 0.83 0.85 0.83  CALCIUM 8.7* 8.2* 8.4* 8.2* 8.9   GFR: Estimated Creatinine Clearance: 79.1 mL/min (by C-G formula based on SCr of 0.83 mg/dL). Liver Function Tests: Recent Labs  Lab 04/21/19 1722 04/23/19 0500 04/24/19 0500 04/25/19 0500 04/26/19 0500  AST 25 17 22 23  35  ALT 17 13 13 16 19   ALKPHOS 84 64  75 75 86  BILITOT 1.2 0.5 0.8 1.0 0.6  PROT 7.5 6.1* 6.2* 5.8* 6.7  ALBUMIN 3.1* 2.4* 2.3* 2.4* 2.6*   No results for input(s): LIPASE, AMYLASE in the last 168 hours. No results for input(s): AMMONIA in the last 168 hours. Coagulation Profile: No results for input(s): INR, PROTIME in the last 168 hours. Cardiac Enzymes: No results for input(s): CKTOTAL, CKMB, CKMBINDEX, TROPONINI in the last 168 hours. BNP (last 3 results) No results for input(s): PROBNP in the last 8760 hours. HbA1C: No results for input(s): HGBA1C in the last 72 hours. CBG: Recent Labs  Lab 04/25/19 1205 04/25/19 1642 04/25/19 2155 04/26/19 0805 04/26/19 1203  GLUCAP 165* 138* 239* 228* 153*   Lipid Profile: No results for input(s): CHOL, HDL, LDLCALC, TRIG, CHOLHDL, LDLDIRECT in the last 72 hours. Thyroid Function Tests: No results for input(s): TSH, T4TOTAL, FREET4, T3FREE, THYROIDAB in the last 72 hours. Anemia Panel: Recent Labs    04/25/19 0500 04/26/19 0500  FERRITIN 33 44   Urine analysis: No results found for: COLORURINE, APPEARANCEUR, LABSPEC, [place], GLUCOSEU, [place], BILIRUBINUR,  KETONESUR, PROTEINUR, UROBILINOGEN, NITRITE, LEUKOCYTESUR Sepsis Labs: @LABRCNTIP (procalcitonin:4,lacticidven:4)  ) Recent Results (from the past 240 hour(s))  Blood Culture (routine x 2)     Status: None   Collection Time: 04/21/19  5:38 PM   Specimen: BLOOD LEFT ARM  Result Value Ref Range Status   Specimen Description BLOOD LEFT ARM  Final   Special Requests   Final    BOTTLES DRAWN AEROBIC AND ANAEROBIC Blood Culture adequate volume   Culture   Final    NO GROWTH 5 DAYS Performed at [facility], [address] [place], [place] [address]    Report Status 04/26/2019 FINAL  Final  Blood Culture (routine x 2)     Status: None   Collection Time: 04/21/19  5:38 PM   Specimen: Right Antecubital; Blood  Result Value Ref Range Status   Specimen Description RIGHT ANTECUBITAL  Final   Special Requests    Final    BOTTLES DRAWN AEROBIC AND ANAEROBIC Blood Culture adequate volume   Culture   Final    NO GROWTH 5 DAYS Performed at [facility], [address] [place], [place] [address]    Report Status 04/26/2019 FINAL  Final  MRSA PCR Screening     Status: None   Collection Time: 04/23/19 12:35 PM   Specimen: Nasal Mucosa; Nasopharyngeal  Result Value Ref Range Status   MRSA by PCR NEGATIVE NEGATIVE Final    Comment:        The GeneXpert MRSA Assay (FDA approved for NASAL specimens only), is one component of a comprehensive MRSA colonization surveillance program. It is not intended to diagnose MRSA infection nor to guide or monitor treatment for MRSA infections. Performed at [facility], [address] [place], [place] [address]       Studies: No results found.  Scheduled Meds: . albuterol  2 puff Inhalation Q8H  . aspirin EC  81 mg Oral Daily  . buPROPion  100 mg Oral Daily  . cholecalciferol  1,000 Units Oral Daily  . dexamethasone (DECADRON) injection  6 mg Intravenous Q24H  . dextromethorphan-guaiFENesin  1 tablet Oral BID  . feeding supplement (ENSURE ENLIVE)  237 mL Oral BID BM  . insulin aspart  0-15 Units Subcutaneous TID WC  . insulin aspart  0-5 Units Subcutaneous QHS  . insulin aspart  7 Units Subcutaneous TID WC  . [START ON 04/27/2019] insulin glargine  10 Units Subcutaneous Daily  . insulin glargine  3 Units Subcutaneous Once  . ipratropium  2 puff Inhalation Q8H  . pantoprazole  40 mg Oral Daily  . vitamin C  500 mg Oral Daily  . zinc sulfate  220 mg Oral Daily    Continuous Infusions: . heparin 1,250 Units/hr (04/26/19 1131)     LOS: 5 days     Darlin Droparole N Kelsey Edman, MD [facility] Pager [phone]  If 7PM-7AM, please contact night-coverage [email or web address] Password [email or web address] 04/26/2019, 1:09 PM

## 2019-04-27 ENCOUNTER — Inpatient Hospital Stay (HOSPITAL_COMMUNITY): Payer: PPO

## 2019-04-27 DIAGNOSIS — J1289 Other viral pneumonia: Secondary | ICD-10-CM

## 2019-04-27 DIAGNOSIS — E861 Hypovolemia: Secondary | ICD-10-CM

## 2019-04-27 DIAGNOSIS — U071 COVID-19: Principal | ICD-10-CM

## 2019-04-27 DIAGNOSIS — I2699 Other pulmonary embolism without acute cor pulmonale: Secondary | ICD-10-CM

## 2019-04-27 DIAGNOSIS — I9589 Other hypotension: Secondary | ICD-10-CM

## 2019-04-27 DIAGNOSIS — I2692 Saddle embolus of pulmonary artery without acute cor pulmonale: Secondary | ICD-10-CM

## 2019-04-27 LAB — GLUCOSE, CAPILLARY
Glucose-Capillary: 273 mg/dL — ABNORMAL HIGH (ref 70–99)
Glucose-Capillary: 233 mg/dL — ABNORMAL HIGH (ref 70–99)
Glucose-Capillary: 219 mg/dL — ABNORMAL HIGH (ref 70–99)
Glucose-Capillary: 166 mg/dL — ABNORMAL HIGH (ref 70–99)

## 2019-04-27 LAB — CBC WITH DIFFERENTIAL/PLATELET
Abs Immature Granulocytes: 0.27 10*3/uL — ABNORMAL HIGH (ref 0.00–0.07)
Basophils Absolute: 0.1 10*3/uL (ref 0.0–0.1)
Basophils Relative: 1 %
Eosinophils Absolute: 0 10*3/uL (ref 0.0–0.5)
Eosinophils Relative: 0 %
HCT: 36.7 % (ref 36.0–46.0)
Hemoglobin: 11.4 g/dL — ABNORMAL LOW (ref 12.0–15.0)
Immature Granulocytes: 3 %
Lymphocytes Relative: 14 %
Lymphs Abs: 1.2 10*3/uL (ref 0.7–4.0)
MCH: 24 pg — ABNORMAL LOW (ref 26.0–34.0)
MCHC: 31.1 g/dL (ref 30.0–36.0)
MCV: 77.3 fL — ABNORMAL LOW (ref 80.0–100.0)
Monocytes Absolute: 0.4 10*3/uL (ref 0.1–1.0)
Monocytes Relative: 5 %
Neutro Abs: 6.3 10*3/uL (ref 1.7–7.7)
Neutrophils Relative %: 77 %
Platelets: 154 10*3/uL (ref 150–400)
RBC: 4.75 MIL/uL (ref 3.87–5.11)
RDW: 23.3 % — ABNORMAL HIGH (ref 11.5–15.5)
WBC: 8.2 10*3/uL (ref 4.0–10.5)
nRBC: 0 % (ref 0.0–0.2)

## 2019-04-27 LAB — HEMOGLOBIN AND HEMATOCRIT, BLOOD
HCT: 34.2 % — ABNORMAL LOW (ref 36.0–46.0)
Hemoglobin: 9.7 g/dL — ABNORMAL LOW (ref 12.0–15.0)

## 2019-04-27 LAB — COMPREHENSIVE METABOLIC PANEL
ALT: 17 U/L (ref 0–44)
AST: 23 U/L (ref 15–41)
Albumin: 2.6 g/dL — ABNORMAL LOW (ref 3.5–5.0)
Alkaline Phosphatase: 79 U/L (ref 38–126)
Anion gap: 8 (ref 5–15)
BUN: 16 mg/dL (ref 8–23)
CO2: 25 mmol/L (ref 22–32)
Calcium: 8.6 mg/dL — ABNORMAL LOW (ref 8.9–10.3)
Chloride: 102 mmol/L (ref 98–111)
Creatinine, Ser: 0.87 mg/dL (ref 0.44–1.00)
GFR calc Af Amer: >60 mL/min (ref >60)
GFR calc non Af Amer: >60 mL/min (ref >60)
Glucose, Bld: 313 mg/dL — ABNORMAL HIGH (ref 70–99)
Potassium: 5 mmol/L (ref 3.5–5.1)
Sodium: 135 mmol/L (ref 135–145)
Total Bilirubin: 0.9 mg/dL (ref 0.3–1.2)
Total Protein: 6.3 g/dL — ABNORMAL LOW (ref 6.5–8.1)

## 2019-04-27 LAB — HEPARIN LEVEL (UNFRACTIONATED): Heparin Unfractionated: 0.38 IU/mL (ref 0.30–0.70)

## 2019-04-27 LAB — BLOOD GAS, VENOUS
Acid-base deficit: 2 mmol/L (ref 0.0–2.0)
Bicarbonate: 22.7 mmol/L (ref 20.0–28.0)
FIO2: 28
O2 Saturation: 51.8 %
Patient temperature: 37
pCO2, Ven: 41.7 mmHg — ABNORMAL LOW (ref 44.0–60.0)
pH, Ven: 7.355 (ref 7.250–7.430)
pO2, Ven: 32.7 mmHg (ref 32.0–45.0)

## 2019-04-27 LAB — PROTIME-INR
INR: 1.4 — ABNORMAL HIGH (ref 0.8–1.2)
Prothrombin Time: 17.1 seconds — ABNORMAL HIGH (ref 11.4–15.2)

## 2019-04-27 LAB — CBC
HCT: 32.2 % — ABNORMAL LOW (ref 36.0–46.0)
Hemoglobin: 9.8 g/dL — ABNORMAL LOW (ref 12.0–15.0)
MCH: 24.4 pg — ABNORMAL LOW (ref 26.0–34.0)
MCHC: 30.4 g/dL (ref 30.0–36.0)
MCV: 80.1 fL (ref 80.0–100.0)
Platelets: 218 10*3/uL (ref 150–400)
RBC: 4.02 MIL/uL (ref 3.87–5.11)
RDW: 23.4 % — ABNORMAL HIGH (ref 11.5–15.5)
WBC: 17.5 10*3/uL — ABNORMAL HIGH (ref 4.0–10.5)
nRBC: 0 % (ref 0.0–0.2)

## 2019-04-27 LAB — BASIC METABOLIC PANEL
Anion gap: 12 (ref 5–15)
BUN: 23 mg/dL (ref 8–23)
CO2: 21 mmol/L — ABNORMAL LOW (ref 22–32)
Calcium: 8.3 mg/dL — ABNORMAL LOW (ref 8.9–10.3)
Chloride: 105 mmol/L (ref 98–111)
Creatinine, Ser: 1.07 mg/dL — ABNORMAL HIGH (ref 0.44–1.00)
GFR calc Af Amer: >60 mL/min (ref >60)
GFR calc non Af Amer: 54 mL/min — ABNORMAL LOW (ref >60)
Glucose, Bld: 210 mg/dL — ABNORMAL HIGH (ref 70–99)
Potassium: 4.4 mmol/L (ref 3.5–5.1)
Sodium: 138 mmol/L (ref 135–145)

## 2019-04-27 LAB — D-DIMER, QUANTITATIVE: D-Dimer, Quant: 3.2 ug/mL-FEU — ABNORMAL HIGH (ref 0.00–0.50)

## 2019-04-27 LAB — FERRITIN: Ferritin: 36 ng/mL (ref 11–307)

## 2019-04-27 LAB — LACTIC ACID, PLASMA
Lactic Acid, Venous: 3.9 mmol/L (ref 0.5–1.9)
Lactic Acid, Venous: 4 mmol/L (ref 0.5–1.9)

## 2019-04-27 LAB — LACTATE DEHYDROGENASE: LDH: 282 U/L — ABNORMAL HIGH (ref 98–192)

## 2019-04-27 LAB — PREPARE RBC (CROSSMATCH)

## 2019-04-27 LAB — C-REACTIVE PROTEIN: CRP: 0.9 mg/dL (ref <1.0)

## 2019-04-27 MED ORDER — HYDROMORPHONE HCL 1 MG/ML IJ SOLN
0.5000 mg | Freq: Once | INTRAMUSCULAR | Status: AC
  Administered 2019-04-27: 0.5 mg via INTRAVENOUS
  Filled 2019-04-27: qty 0.5

## 2019-04-27 MED ORDER — CHLORHEXIDINE GLUCONATE CLOTH 2 % EX PADS
6.0000 | MEDICATED_PAD | Freq: Every day | CUTANEOUS | Status: DC
  Administered 2019-04-27 – 2019-05-01 (×5): 6 via TOPICAL

## 2019-04-27 MED ORDER — DICLOFENAC SODIUM 1 % EX GEL
4.0000 g | Freq: Four times a day (QID) | CUTANEOUS | Status: DC
  Administered 2019-04-28 – 2019-04-29 (×4): 4 g via TOPICAL
  Filled 2019-04-27 (×2): qty 100

## 2019-04-27 MED ORDER — ALBUMIN HUMAN 25 % IV SOLN
50.0000 g | Freq: Four times a day (QID) | INTRAVENOUS | Status: DC
  Administered 2019-04-27 – 2019-04-28 (×3): 50 g via INTRAVENOUS
  Filled 2019-04-27 (×2): qty 200
  Filled 2019-04-27: qty 150
  Filled 2019-04-27: qty 200
  Filled 2019-04-27: qty 150
  Filled 2019-04-27: qty 50

## 2019-04-27 MED ORDER — SODIUM CHLORIDE 0.9% IV SOLUTION: Freq: Once | INTRAVENOUS | Status: DC

## 2019-04-27 MED ORDER — BISACODYL 5 MG PO TBEC: 5.0000 mg | DELAYED_RELEASE_TABLET | Freq: Every day | ORAL | Status: DC | PRN

## 2019-04-27 MED ORDER — ORAL CARE MOUTH RINSE
15.0000 mL | Freq: Two times a day (BID) | OROMUCOSAL | Status: DC
  Administered 2019-04-27 – 2019-05-01 (×10): 15 mL via OROMUCOSAL

## 2019-04-27 MED ORDER — OXYCODONE HCL 5 MG PO TABS
5.0000 mg | ORAL_TABLET | ORAL | Status: DC | PRN
  Administered 2019-04-27 – 2019-04-28 (×2): 5 mg via ORAL
  Filled 2019-04-27 (×2): qty 1

## 2019-04-27 MED ORDER — IOHEXOL 350 MG/ML SOLN
100.0000 mL | Freq: Once | INTRAVENOUS | Status: AC | PRN
  Administered 2019-04-27: 100 mL via INTRAVENOUS

## 2019-04-27 MED ORDER — SENNOSIDES-DOCUSATE SODIUM 8.6-50 MG PO TABS
1.0000 | ORAL_TABLET | Freq: Every day | ORAL | Status: DC
  Administered 2019-04-27 – 2019-04-29 (×3): 1 via ORAL
  Filled 2019-04-27 (×4): qty 1

## 2019-04-27 MED ORDER — SODIUM CHLORIDE 0.9 % IV SOLN
INTRAVENOUS | Status: AC
  Administered 2019-04-27 – 2019-04-28 (×2): via INTRAVENOUS

## 2019-04-27 MED ORDER — SODIUM CHLORIDE 0.9 % IV BOLUS
1000.0000 mL | Freq: Once | INTRAVENOUS | Status: AC
  Administered 2019-04-27: 18:00:00 1000 mL via INTRAVENOUS

## 2019-04-27 NOTE — Progress Notes (Addendum)
PCCM progress:  Pt to be transferred to 26M, discussed with RN and patient remains hemodynamically stable.  Hgb dropped from 12.0 to 9.8, but then stable on last h/h at 9.7.  Discussed with IR, they will see her in the morning unless there is concern for active bleeding overnight.  Continue to follow serial hgb.

## 2019-04-27 NOTE — Progress Notes (Signed)
[place] Progress Note Patient Name: Regina Munoz DOB: [birth year] MRN: [id number]   Date of Service  04/27/2019  HPI/Events of Note  Called for 10/10 pain despite having received oxycodone 5mg  PO.  eICU Interventions  Spoke to RN. Will order Dilaudid 0.5mg  IV for pain relief.     Intervention Category Intermediate Interventions: Pain - evaluation and management  Marily Lente Ashanna Heinsohn 04/27/2019, 10:14 PM

## 2019-04-27 NOTE — Consult Note (Signed)
NAME:  Regina Munoz, MRN:  [id number], DOB:  1950-06-22, LOS: 6 ADMISSION DATE:  04/21/2019, CONSULTATION DATE: 04/27/2019 REFERRING MD: Dr. Margo AyeHall, CHIEF COMPLAINT: Bilateral pulmonary embolism, rectus muscle hematoma  Brief History   Patient was admitted with difficulty breathing Recent positive Covid 19 11/30 Admitted for COVID-19 pneumonia started on dexamethasone and remdesivir Increased hypoxemia on 12/ 9-patient had a CT angio showing pulmonary embolism Patient was started on heparin This morning started complaining of abdominal pain, had a CT scan of the abdomen revealing wall hematoma ICU consulted History of present illness   COVID-19 pneumonia Pulmonary embolism 12 /9 Rectus sheath/pelvic hematoma12/12   Past Medical History   Past Medical History:  Diagnosis Date  . Anxiety   . Arthritis   . Asthma   . Complication of anesthesia   . COVID-19   . Diabetes mellitus without complication (HCC)   . Dysrhythmia    palpitations ->20 yrs stress test neg nothing since  . GERD (gastroesophageal reflux disease)   . Headache   . PONV (postoperative nausea and vomiting)      Significant Hospital Events   Pulmonary embolism 12/9 Rectus sheath hematoma 12/12 Consults:  PCCM  Procedures:  None  Significant Diagnostic Tests:  1. Massive pulmonary embolus within both main pulmonary arteries extending into all proximal lobar branches with CT evidence of right heart strain (RV/LV Ratio = 1.65) consistent with at least submassive (intermediate risk) PE. The presence of right heart strain has been associated with an increased risk of morbidity and mortality. Please activate Code PE by paging [phone]. 2. Multifocal pulmonary ground-glass opacity without specific evidence of infection 3. Cholelithiasis.  12/12 IMPRESSION: VASCULAR  Large pelvic hematoma is noted which appears to originate from the inferior portion of the left rectus muscle, most consistent  with spontaneous hemorrhage, potentially related to anticoagulation. This causes displacement and compression of the urinary bladder. There is seen some irregular high density along the medial portion of the left inferior rectus muscle which may represent contrast extravasation and possibly active hemorrhage. Critical Value/emergent results were called by telephone at the time of interpretation on 04/27/2019 at 5:05 pm to providerCAROLE HALL , who verbally acknowledged these results.  Mild cholelithiasis is noted.  Minimally nodular hepatic margins are noted suggesting possible hepatic cirrhosis.  Echocardiogram 1210 Normal right-sided heart function Micro Data:  SARS coronavirus +11/30  Antimicrobials:  Remdesivir  Interim history/subjective:  Complain of abdominal wall pain to left hand this morning, now 7/10  Objective   Blood pressure 100/68, pulse 98, temperature 97.6 F (36.4 C), temperature source Oral, resp. rate 16, height 5\' 3"  (1.6 m), weight 111.8 kg, SpO2 97 %.        Intake/Output Summary (Last 24 hours) at 04/27/2019 1843 Last data filed at 04/27/2019 1132 Gross per 24 hour  Intake 510 ml  Output 1700 ml  Net -1190 ml   Filed Weights   04/21/19 1715 04/22/19 0604 04/25/19 0326  Weight: 109.8 kg 107 kg 111.8 kg    Examination: General: Middle-age lady, does not appear to be in distress HENT: Moist oral mucosa Lungs: Clear breath sounds Cardiovascular: S1-S2 appreciated Abdomen: Soft, bowel sounds appreciated Extremities: No clubbing, no edema Neuro: Alert and oriented x3 GU:   Resolved Hospital Problem list     Assessment & Plan:  Large pelvic hematoma -CT showing that there may be a component of active hemorrhage -Interventional radiology consulted  Submassive pulmonary embolism -Strain noted on CT -Echo shows normal right-sided function -Was  being anticoagulated with heparin -Complicated by pelvic hematoma -Anticoagulation on  hold  Hypotension -Patient received fluid resuscitation -Currently receiving albumin  COVID-19 pneumonia -Complete course of remdesivir -Complete course of dexamethasone  Blood loss anemia - Monitor H&H every 4 Hold aspirin Follow-up with interventional radiology  We will transfer the patient to the ICU for close monitoring has she has a submassive PE and cannot be safely anticoagulated   Patient will likely need an IVC filter placement in the context of massive PE with inability to anticoagulate safely  Best practice:  Diet: As tolerated Pain/Anxiety/Delirium protocol (if indicated): Oxycodone VAP protocol (if indicated): Not indicated DVT prophylaxis:  Glucose control: Monitor Mobility: Bedrest Code Status: Full code Family Communication: Primary did update Disposition: ICU  Labs   CBC: Recent Labs  Lab 04/23/19 0500 04/24/19 0500 04/25/19 0500 04/26/19 0500 04/27/19 0419 04/27/19 1548  WBC 8.9 7.9 7.0 8.0 8.2 17.5*  NEUTROABS 6.9 6.0 4.7 6.1 6.3  --   HGB 10.9* 11.6* 11.3* 12.0 11.4* 9.8*  HCT 34.9* 37.2 36.8 38.7 36.7 32.2*  MCV 78.8* 77.3* 77.6* 78.5* 77.3* 80.1  PLT 119* 128* 136* 147* 154 416    Basic Metabolic Panel: Recent Labs  Lab 04/24/19 0500 04/25/19 0500 04/26/19 0500 04/27/19 0419 04/27/19 1548  NA 139 138 136 135 138  K 4.8 4.8 5.5* 5.0 4.4  CL 103 105 102 102 105  CO2 25 26 23 25  21*  GLUCOSE 275* 250* 283* 313* 210*  BUN 14 14 15 16 23   CREATININE 0.83 0.85 0.83 0.87 1.07*  CALCIUM 8.4* 8.2* 8.9 8.6* 8.3*   GFR: Estimated Creatinine Clearance: 61.4 mL/min (A) (by C-G formula based on SCr of 1.07 mg/dL (H)). Recent Labs  Lab 04/21/19 1722 04/21/19 2058 04/25/19 0500 04/26/19 0500 04/27/19 0419 04/27/19 1548 04/27/19 1556  PROCALCITON <0.10  --   --   --   --   --   --   WBC 7.1  --  7.0 8.0 8.2 17.5*  --   LATICACIDVEN 2.8* 1.6  --   --   --   --  3.9*    Liver Function Tests: Recent Labs  Lab 04/23/19 0500  04/24/19 0500 04/25/19 0500 04/26/19 0500 04/27/19 0419  AST 17 22 23  35 23  ALT 13 13 16 19 17   ALKPHOS 64 75 75 86 79  BILITOT 0.5 0.8 1.0 0.6 0.9  PROT 6.1* 6.2* 5.8* 6.7 6.3*  ALBUMIN 2.4* 2.3* 2.4* 2.6* 2.6*   No results for input(s): LIPASE, AMYLASE in the last 168 hours. No results for input(s): AMMONIA in the last 168 hours.  ABG No results found for: PHART, PCO2ART, PO2ART, HCO3, TCO2, ACIDBASEDEF, O2SAT   Coagulation Profile: No results for input(s): INR, PROTIME in the last 168 hours.  Cardiac Enzymes: No results for input(s): CKTOTAL, CKMB, CKMBINDEX, TROPONINI in the last 168 hours.  HbA1C: Hgb A1c MFr Bld  Date/Time Value Ref Range Status  04/22/2019 11:00 AM 7.8 (H) 4.8 - 5.6 % Final    Comment:    (NOTE) Pre diabetes:          5.7%-6.4% Diabetes:              >6.4% Glycemic control for   <7.0% adults with diabetes   02/10/2016 11:52 AM 5.8 (H) 4.8 - 5.6 % Final    Comment:    (NOTE)         Pre-diabetes: 5.7 - 6.4  Diabetes: >6.4         Glycemic control for adults with diabetes: <7.0     CBG: Recent Labs  Lab 04/26/19 1653 04/26/19 2131 04/27/19 0751 04/27/19 1213 04/27/19 1635  GLUCAP 118* 262* 273* 233* 219*    Review of Systems:   Significant for abdominal wall pain Denies significant shortness of breath Occasional cough  Past Medical History  She,  has a past medical history of Anxiety, Arthritis, Asthma, Complication of anesthesia, COVID-19, Diabetes mellitus without complication (HCC), Dysrhythmia, GERD (gastroesophageal reflux disease), Headache, and PONV (postoperative nausea and vomiting).   Surgical History    Past Surgical History:  Procedure Laterality Date  . ABDOMINAL HYSTERECTOMY    . ANTERIOR CERVICAL DECOMP/DISCECTOMY FUSION N/A 02/17/2016   Procedure: ANTERIOR CERVICAL DECOMPRESSION/DISCECTOMY FUSION 1 LEVEL C4-5;  Surgeon: Venita Lick, MD;  Location: [facility] OR;  Service: Orthopedics;  Laterality: N/A;  . EYE  SURGERY Bilateral    cataracts  . HIP CLOSED REDUCTION Right 12/24/2012   Procedure: CLOSED REDUCTION HIP;  Surgeon: Emilee Hero, MD;  Location: [facility] ORS;  Service: Orthopedics;  Laterality: Right;  . JOINT REPLACEMENT  2002   right and left knees  . SHOULDER ARTHROSCOPY Left   . TOTAL HIP ARTHROPLASTY Right    2002     Social History   reports that she has never smoked. She has never used smokeless tobacco. She reports that she does not drink alcohol or use drugs.   Family History   Her family history is not on file.   Allergies No Known Allergies   Home Medications  Prior to Admission medications   Medication Sig Start Date End Date Taking? Authorizing Provider  acetaminophen (TYLENOL) 500 MG tablet Take 1,000 mg by mouth every 6 (six) hours as needed for mild pain or moderate pain.    Yes [provider]  aspirin EC 81 MG tablet Take 81 mg by mouth daily.   Yes [provider]  buPROPion (ZYBAN) 150 MG 12 hr tablet Take 75 mg by mouth every morning.  03/28/19  Yes [provider]  cetirizine (ZYRTEC) 10 MG tablet Take 10 mg by mouth daily. 02/13/19  Yes [provider]  cholecalciferol (VITAMIN D) 1000 UNITS tablet Take 1,000 Units by mouth daily.   Yes [provider]  diclofenac Sodium (VOLTAREN) 1 % GEL Apply 2 g topically daily as needed (for pain).    Yes [provider]  metFORMIN (GLUCOPHAGE) 500 MG tablet Take 500 mg by mouth 2 (two) times daily with a meal.    Yes [provider]  omeprazole (PRILOSEC) 20 MG capsule Take 20 mg by mouth daily.   Yes [provider]     Critical care time:   Virl Diamond, MD [place], PCCM Cell: [phone]

## 2019-04-27 NOTE — Progress Notes (Signed)
Patient reported LLQ abdominal pain starting this morning and gradually worsening this afternoon from 2/10 to 7/10, not responding to analgesic.  Bedside RN held heparin drip and Stat CT abdomen and pelvis was ordered.   Scan demonstrated left pelvic spontaneous hematoma. IR consulted for possible embolization in the setting of recent massive bilateral PE.  Stat CBC, lactic acid ordered and pending.   B/L LE duplex US ordered stat.  SCDs on hold until B/L LE DVT is ruled out.  BP soft. Maintain MAP >65 with IV fluid NS at 75 cc/hr and IV albumin 50 g q 6H x 4 doses. Will continue to closely monitor and treat as indicated. Will update spouse.

## 2019-04-27 NOTE — Significant Event (Signed)
Rapid Response Event Note  Overview: Time Called: 8841 Arrival Time: 6606 Event Type: Hypotension  Initial Focused Assessment: Patient admitted with Covid 19 PNA, massive bilat PE She has been on heparin since late 12/9 She is complaining of lower abdominal pain and back pain.  She is alert.  Skin pale, cold/clammy to the touch. BP 87/67  HR 102  RR 19 O2 sat 98% on Max  Interventions: Heparin stopped 1L NS bolus given BP improved to 118/69 while fluids infusing, after bolus bp 96/66 CTA abdomen and pelvis done Arterial blood gas attempted, unable to draw blood. Order placed for venous blood gas. Labs drawn 2nd L NS bolus Dr Nevada Crane at bedside to assess patient and review CT and lab results with patient, Pelvic Hematoma Per Dr Nevada Crane IR and CCM consulted  Transfer to ICU BP 90-110/60s  HR 84  RR 18  O2 sat 99% on 2L         Plan of Care (if not transferred):  Event Summary: Name of Physician Notified: DR Nevada Crane at 1535    at    Outcome: Transferred (Comment)  Event End Time: 1910  Raliegh Ip

## 2019-04-27 NOTE — Progress Notes (Signed)
Report given to [facility] RN Theadora Rama

## 2019-04-27 NOTE — Progress Notes (Signed)
Nurse called into room by pt. Cites feeling poorly and 10/10 pain in lower abdomen radiating towards back. Pt sstates feeling fulll, but unrelieved by urinating/defecating. Bladder scan indicated 1ml. SBP in 80s. Other VSS. MD notified. Agricultural consultant notified. Rapid RN called.

## 2019-04-27 NOTE — Progress Notes (Signed)
+   LLE  DVT, age indeterminate.

## 2019-04-27 NOTE — Progress Notes (Signed)
Pt received as Rapid Response from 5W.  Dr. Gillermina Phy made aware via [place], that pt is still complaining of left back & left abdominal pain which she rates 10 out of 10 despite pain med.  Awaiting new orders.

## 2019-04-27 NOTE — Progress Notes (Signed)
Blood bank called and said there was a unit of blood ready for pick up for pt. While looking in the orders there are no orders that say Transfuse Blood. The only order for blood is BPAM RBC. Spoke with Dr. Gillermina Phy about confusion on if the order was put in wrong and if we should administer blood or not. Pt is not hypotensive and other VS WNL. Per MD no reason to administer blood at this time. Will continue to monitor pt for s/s of active bleeding.

## 2019-04-27 NOTE — Progress Notes (Signed)
CRITICAL VALUE ALERT  Critical Value:  3.9  Date & Time Notied:  04/27/19 1746  Provider Notified: Nevada Crane MD  Orders Received/Actions taken: MD notified. Patient to be transferred to ICU

## 2019-04-27 NOTE — Progress Notes (Signed)
VASCULAR LAB PRELIMINARY  PRELIMINARY  PRELIMINARY  PRELIMINARY  Bilateral lower extremity venous duplex completed.    Preliminary report:  See CV proc for preliminary results.   Durga Saldarriaga, RVT 04/27/2019, 6:46 PM

## 2019-04-27 NOTE — Progress Notes (Addendum)
This note also relates to the following rows which could not be included: ECG Heart Rate - Cannot attach notes to unvalidated device data    04/27/19 1519  Vitals  BP 99/66  MAP (mmHg) 77  Pulse Rate (!) 101  Resp 18  Oxygen Therapy  SpO2 95 %  MEWS Score  MEWS RR 0  MEWS Pulse 1  MEWS Systolic 1  MEWS LOC 0  MEWS Temp 0  MEWS Score 2  MEWS Score Color Yellow  MEWS Assessment  Is this an acute change? Yes  MEWS guidelines implemented *See Row Information* Yellow   Pt c/o 10/10 pain in lower abdomen. MD notified. CT abdomen scan ordered. Will continue to monitor VS per protocol

## 2019-04-27 NOTE — Progress Notes (Signed)
PROGRESS NOTE  Regina Munoz RXV:[id number] DOB: 08-20-1950 DOA: 04/21/2019 PCP: [name], DO  HPI/Recap of past 51 hours: 68 year old female with past medical history significant for type 2 diabetes, asthma and anxiety who presented to the ED from home due to difficulty breathing and cough.  Associated with fever chills nausea vomiting loss of taste and diarrhea.  Symptoms started 2 weeks ago and have progressed.  Patient spouse and son tested positive for COVID-19.  Patient tested positive for COVID-19 on 04/15/2019.  Hypoxic on presentation with O2 saturation in the 80s on room air.  Chest x-ray showed patchy infiltrates worse at bases.  Started on remdesivir and Decadron.  Admitted for acute COVID-19 viral pneumonia.  Hospital course complicated by significant hypoxia with ambulation with O2 saturation dropping in the 70s.  CTA chest positive for massive bilateral pulmonary embolism.  Started on heparin drip overnight.  Obtain troponin, mildly elevated, 2D echo is pending.    PCCM contacted, curbsided and discussed with Dr. [name] 12/10.  Hemodynamically stable at this time, recommended to continue heparin drip for 3 days then transition to DOAC.  Will start Eliquis when off heparin drip likely tomorrow 04/28/2019.  04/27/19: Seen and examined.  Reports left lower quadrant abdominal pain.  1 bowel movement today.  Denies nausea.  No bruising at site on physical exam.  Will try analgesics and see if symptomatology improves.  H&H stable on heparin drip.  We will continue to closely monitor for any changes.   Assessment/Plan: Active Problems:   Pneumonia due to COVID-19 virus   Acute COVID-19 viral pneumonia Initially hypoxemic on presentation with O2 saturation in the 80s on room air.   Covid -19 positive on 04/15/19 Significant drop in oxygen saturation with minimal ambulation >> in the 70's reported by bedside RN Katrinka Blazing on 04/24/2019. CTA chest positive for acute massive  bilateral pulmonary embolism Completed 5 days of remdesivir.   C/w Decadron to complete 10 days  Continue albuterol inhaler and ipratropium inhaler 3 times daily Continue vitamin C, D3 and zinc Continue incentive spirometer Continue to trend inflammatory markers.  Acute hypoxic respiratory failure multifactorial secondary to acute COVID-19 viral pneumonia and acute massive bilateral pulmonary embolism with right heart strain on CT. Management as stated above. Home O2 evaluation for DC planning  Newly diagnosed acute massive bilateral pulmonary embolism with right heart strain Independently reviewed CT a chest which shows pulmonary embolism affecting both pulmonary arteries extending into all proximal lobar branches. 2D echo completed showed preserved LVEF PCCM contacted, curbsided and discussed with Dr. [name] 12/10.  Hemodynamically stable at this time, recommended to continue heparin drip for 3 days then transition to DOAC.  Day number 3 out of 3. Switch to Eliquis on 04/28/2019, covered by her insurance.  Elevated troponin likely demand ischemia from acute massive bilateral PE. Troponin is peaked at 32 then trended down. No anginal symptoms at the time of this visit.  Left lower quadrant abdominal pain, unclear etiology Reported 1 bowel movement today Denies nausea Treated with analgesic Continue to closely monitor symptomatology for any changes H&H is stable on heparin drip, repeat H&H this afternoon Physical exam benign except for tenderness with palpation of left lower quadrant, no signs of bruising noted.  Resolved sinus tachycardia likely secondary to acute PE  Prolonged QTC Avoid QTC prolonging agents Monitor with twelve-lead EKG  Type 2 diabetes with hyperglycemia Hemoglobin A1c 7.8 Uncontrolled hyperglycemia exacerbated by Decadron Increased Lantus dosing to 10 units daily Continue NovoLog 7  units 3 times daily Continue insulin sliding scale.  Chronic  anxiety/depression Continue home medication  GERD Continue home medication  Obesity BMI 41 Recommend weight loss outpatient with regular physical activity and healthy diet daily once healed from COVID-19 viral pneumonia. PT OT to assess.   DVT prophylaxis:  Heparin drip Code Status: Full code Family Communication:  None at bedside.  Disposition Plan:  Likely home with home health services on 04/28/2019 after being switched to Eliquis to treat bilateral pulmonary embolism.  Consults called:  Curbsided with PCCM Dr. Isaiah SergeMannam on 04/25/2019.   Objective: Vitals:   04/27/19 0449 04/27/19 0752 04/27/19 0800 04/27/19 1225  BP:   124/69 113/73  Pulse:   85 85  Resp:   18 (!) 21  Temp: 97.8 F (36.6 C) 98 F (36.7 C)  97.6 F (36.4 C)  TempSrc: Oral Oral  Oral  SpO2:   96% 95%  Weight:      Height:        Intake/Output Summary (Last 24 hours) at 04/27/2019 1417 Last data filed at 04/27/2019 1132 Gross per 24 hour  Intake 510 ml  Output 1700 ml  Net -1190 ml   Filed Weights   04/21/19 1715 04/22/19 0604 04/25/19 0326  Weight: 109.8 kg 107 kg 111.8 kg    Exam:  . General: 68 y.o. year-old female obese in no acute distress.  Alert and oriented x3. . Cardiovascular: Regular rate and rhythm no rubs or gallops.  No JVD or thyromegaly. Marland Kitchen.  Respiratory: Mild rales at bases.  No wheezing noted.  Good respiratory effort.   Abdomen: Obese with tenderness at left lower quadrant with palpation.  Bowel sounds present. Musculoskeletal: No lower extremity edema bilaterally.  2 out of 4 pulses in all 4 extremities. Psychiatry: Mood is appropriate for condition and setting..  Data Reviewed: CBC: Recent Labs  Lab 04/23/19 0500 04/24/19 0500 04/25/19 0500 04/26/19 0500 04/27/19 0419  WBC 8.9 7.9 7.0 8.0 8.2  NEUTROABS 6.9 6.0 4.7 6.1 6.3  HGB 10.9* 11.6* 11.3* 12.0 11.4*  HCT 34.9* 37.2 36.8 38.7 36.7  MCV 78.8* 77.3* 77.6* 78.5* 77.3*  PLT 119* 128* 136* 147* 154   Basic  Metabolic Panel: Recent Labs  Lab 04/23/19 0500 04/24/19 0500 04/25/19 0500 04/26/19 0500 04/27/19 0419  NA 139 139 138 136 135  K 4.1 4.8 4.8 5.5* 5.0  CL 106 103 105 102 102  CO2 23 25 26 23 25   GLUCOSE 245* 275* 250* 283* 313*  BUN 18 14 14 15 16   CREATININE 0.84 0.83 0.85 0.83 0.87  CALCIUM 8.2* 8.4* 8.2* 8.9 8.6*   GFR: Estimated Creatinine Clearance: 75.5 mL/min (by C-G formula based on SCr of 0.87 mg/dL). Liver Function Tests: Recent Labs  Lab 04/23/19 0500 04/24/19 0500 04/25/19 0500 04/26/19 0500 04/27/19 0419  AST 17 22 23  35 23  ALT 13 13 16 19 17   ALKPHOS 64 75 75 86 79  BILITOT 0.5 0.8 1.0 0.6 0.9  PROT 6.1* 6.2* 5.8* 6.7 6.3*  ALBUMIN 2.4* 2.3* 2.4* 2.6* 2.6*   No results for input(s): LIPASE, AMYLASE in the last 168 hours. No results for input(s): AMMONIA in the last 168 hours. Coagulation Profile: No results for input(s): INR, PROTIME in the last 168 hours. Cardiac Enzymes: No results for input(s): CKTOTAL, CKMB, CKMBINDEX, TROPONINI in the last 168 hours. BNP (last 3 results) No results for input(s): PROBNP in the last 8760 hours. HbA1C: No results for input(s): HGBA1C in the last 72 hours.  CBG: Recent Labs  Lab 04/26/19 1203 04/26/19 1653 04/26/19 2131 04/27/19 0751 04/27/19 1213  GLUCAP 153* 118* 262* 273* 233*   Lipid Profile: No results for input(s): CHOL, HDL, LDLCALC, TRIG, CHOLHDL, LDLDIRECT in the last 72 hours. Thyroid Function Tests: No results for input(s): TSH, T4TOTAL, FREET4, T3FREE, THYROIDAB in the last 72 hours. Anemia Panel: Recent Labs    04/26/19 0500 04/27/19 0419  FERRITIN 44 36   Urine analysis: No results found for: COLORURINE, APPEARANCEUR, LABSPEC, PHURINE, GLUCOSEU, HGBUR, BILIRUBINUR, KETONESUR, PROTEINUR, UROBILINOGEN, NITRITE, LEUKOCYTESUR Sepsis Labs: @LABRCNTIP (procalcitonin:4,lacticidven:4)  ) Recent Results (from the past 240 hour(s))  Blood Culture (routine x 2)     Status: None   Collection  Time: 04/21/19  5:38 PM   Specimen: BLOOD LEFT ARM  Result Value Ref Range Status   Specimen Description BLOOD LEFT ARM  Final   Special Requests   Final    BOTTLES DRAWN AEROBIC AND ANAEROBIC Blood Culture adequate volume   Culture   Final    NO GROWTH 5 DAYS Performed at [facility], [address] [place], [place] [address]    Report Status 04/26/2019 FINAL  Final  Blood Culture (routine x 2)     Status: None   Collection Time: 04/21/19  5:38 PM   Specimen: Right Antecubital; Blood  Result Value Ref Range Status   Specimen Description RIGHT ANTECUBITAL  Final   Special Requests   Final    BOTTLES DRAWN AEROBIC AND ANAEROBIC Blood Culture adequate volume   Culture   Final    NO GROWTH 5 DAYS Performed at [facility], [address], [place], [place] [address]    Report Status 04/26/2019 FINAL  Final  MRSA PCR Screening     Status: None   Collection Time: 04/23/19 12:35 PM   Specimen: Nasal Mucosa; Nasopharyngeal  Result Value Ref Range Status   MRSA by PCR NEGATIVE NEGATIVE Final    Comment:        The GeneXpert MRSA Assay (FDA approved for NASAL specimens only), is one component of a comprehensive MRSA colonization surveillance program. It is not intended to diagnose MRSA infection nor to guide or monitor treatment for MRSA infections. Performed at [facility], [place] [address], [place], [place] [address]       Studies: No results found.  Scheduled Meds: . albuterol  2 puff Inhalation Q8H  . aspirin EC  81 mg Oral Daily  . buPROPion  100 mg Oral Daily  . cholecalciferol  1,000 Units Oral Daily  . dexamethasone (DECADRON) injection  6 mg Intravenous Q24H  . dextromethorphan-guaiFENesin  1 tablet Oral BID  . diclofenac Sodium  4 g Topical QID  . feeding supplement (ENSURE ENLIVE)  237 mL Oral BID BM  . insulin aspart  0-15 Units Subcutaneous TID WC  . insulin aspart  0-5 Units Subcutaneous QHS  . insulin aspart  7 Units Subcutaneous TID WC  . insulin  glargine  10 Units Subcutaneous Daily  . ipratropium  2 puff Inhalation Q8H  . mouth rinse  15 mL Mouth Rinse BID  . pantoprazole  40 mg Oral Daily  . vitamin C  500 mg Oral Daily  . zinc sulfate  220 mg Oral Daily    Continuous Infusions: . heparin 1,250 Units/hr (04/27/19 0616)     LOS: 6 days     Kayleen Memos, MD [facility] Pager [phone]  If 7PM-7AM, please contact night-coverage [email or web address] Password [email or web address] 04/27/2019, 2:17 PM

## 2019-04-27 NOTE — Progress Notes (Signed)
Updated the patient's spouse Mr. Lance Galas via phone. He asked to talk to his wife and possibly see her on I-pad.  All questions answered. Will keep informed of any new changes.

## 2019-04-27 NOTE — Progress Notes (Signed)
ANTICOAGULATION CONSULT NOTE - Follow Up Consult  Pharmacy Consult for Heparin Indication: pulmonary embolus  No Known Allergies  Patient Measurements: Height: 5\' 3"  (160 cm) Weight: 246 lb 7.6 oz (111.8 kg) IBW/kg (Calculated) : 52.4 Heparin Dosing Weight: 78 kg  Vital Signs: Temp: 98 F (36.7 C) (12/12 0752) Temp Source: Oral (12/12 0752) BP: 134/70 (12/12 0429) Pulse Rate: 75 (12/12 0429)  Labs: Recent Labs    04/25/19 0430 04/25/19 0500 04/25/19 0832 04/25/19 1608 04/26/19 0500 04/27/19 0419 04/27/19 0420  HGB   < > 11.3*  --   --  12.0 11.4*  --   HCT  --  36.8  --   --  38.7 36.7  --   PLT  --  136*  --   --  147* 154  --   HEPARINUNFRC  --   --   --  0.41 0.49  --  0.38  CREATININE  --  0.85  --   --  0.83 0.87  --   TROPONINIHS  --  32* 28*  --   --   --   --    < > = values in this interval not displayed.    Estimated Creatinine Clearance: 75.5 mL/min (by C-G formula based on SCr of 0.87 mg/dL).   Medical History: Past Medical History:  Diagnosis Date  . Anxiety   . Arthritis   . Asthma   . Complication of anesthesia   . COVID-19   . Diabetes mellitus without complication ([place])   . Dysrhythmia    palpitations ->20 yrs stress test neg nothing since  . GERD (gastroesophageal reflux disease)   . Headache   . PONV (postoperative nausea and vomiting)     Assessment: 68 yo female admitted on 04/21/19 with difficulty breathing,cough, and recently tested positive for COVID-19 on 03/19/19. On 12/9 CTA found new bilateral PEs with right heart strain (RV/LV ratio 1.65). Pharmacy consulted to dose heparin for PE. Pt was not on anticoagulation PTA. Heparin level of 0.38 is therapeutic on heparin 1250 units/hr. CBC stable. No reported bleeding.    Goal of Therapy:  Heparin level 0.3-0.7 units/ml Monitor platelets by anticoagulation protocol: Yes   Plan: Continue heparin infusion at 1250 units/hr Monitor heparin level, CBC, and signs/symptoms of bleeding  daily Follow up transition to apixaban   Cristela Felt, PharmD PGY1 Pharmacy Resident [employer]: [phone]  04/27/2019,8:48 AM

## 2019-04-28 ENCOUNTER — Inpatient Hospital Stay (HOSPITAL_COMMUNITY): Payer: PPO

## 2019-04-28 DIAGNOSIS — N9489 Other specified conditions associated with female genital organs and menstrual cycle: Secondary | ICD-10-CM

## 2019-04-28 LAB — C-REACTIVE PROTEIN: CRP: 1.7 mg/dL — ABNORMAL HIGH (ref <1.0)

## 2019-04-28 LAB — COMPREHENSIVE METABOLIC PANEL
ALT: 22 U/L (ref 0–44)
AST: 37 U/L (ref 15–41)
Albumin: 4.2 g/dL (ref 3.5–5.0)
Alkaline Phosphatase: 47 U/L (ref 38–126)
Anion gap: 12 (ref 5–15)
BUN: 20 mg/dL (ref 8–23)
CO2: 20 mmol/L — ABNORMAL LOW (ref 22–32)
Calcium: 8.8 mg/dL — ABNORMAL LOW (ref 8.9–10.3)
Chloride: 108 mmol/L (ref 98–111)
Creatinine, Ser: 1.01 mg/dL — ABNORMAL HIGH (ref 0.44–1.00)
GFR calc Af Amer: >60 mL/min (ref >60)
GFR calc non Af Amer: 58 mL/min — ABNORMAL LOW (ref >60)
Glucose, Bld: 181 mg/dL — ABNORMAL HIGH (ref 70–99)
Potassium: 4.6 mmol/L (ref 3.5–5.1)
Sodium: 140 mmol/L (ref 135–145)
Total Bilirubin: 1.5 mg/dL — ABNORMAL HIGH (ref 0.3–1.2)
Total Protein: 6.5 g/dL (ref 6.5–8.1)

## 2019-04-28 LAB — URINALYSIS, COMPLETE (UACMP) WITH MICROSCOPIC
Bilirubin Urine: NEGATIVE
Glucose, UA: NEGATIVE mg/dL
Ketones, ur: 15 mg/dL — AB
Leukocytes,Ua: NEGATIVE
Nitrite: POSITIVE — AB
Protein, ur: NEGATIVE mg/dL
Specific Gravity, Urine: 1.025 (ref 1.005–1.030)
pH: 6.5 (ref 5.0–8.0)

## 2019-04-28 LAB — CBC WITH DIFFERENTIAL/PLATELET
Abs Immature Granulocytes: 0.53 10*3/uL — ABNORMAL HIGH (ref 0.00–0.07)
Basophils Absolute: 0.1 10*3/uL (ref 0.0–0.1)
Basophils Relative: 1 %
Eosinophils Absolute: 0.1 10*3/uL (ref 0.0–0.5)
Eosinophils Relative: 1 %
HCT: 22 % — ABNORMAL LOW (ref 36.0–46.0)
Hemoglobin: 6.7 g/dL — CL (ref 12.0–15.0)
Immature Granulocytes: 4 %
Lymphocytes Relative: 20 %
Lymphs Abs: 2.4 10*3/uL (ref 0.7–4.0)
MCH: 25 pg — ABNORMAL LOW (ref 26.0–34.0)
MCHC: 30.5 g/dL (ref 30.0–36.0)
MCV: 82.1 fL (ref 80.0–100.0)
Monocytes Absolute: 1.2 10*3/uL — ABNORMAL HIGH (ref 0.1–1.0)
Monocytes Relative: 10 %
Neutro Abs: 7.9 10*3/uL — ABNORMAL HIGH (ref 1.7–7.7)
Neutrophils Relative %: 64 %
Platelets: 149 10*3/uL — ABNORMAL LOW (ref 150–400)
RBC: 2.68 MIL/uL — ABNORMAL LOW (ref 3.87–5.11)
RDW: 23.6 % — ABNORMAL HIGH (ref 11.5–15.5)
WBC: 12.3 10*3/uL — ABNORMAL HIGH (ref 4.0–10.5)
nRBC: 0 % (ref 0.0–0.2)

## 2019-04-28 LAB — D-DIMER, QUANTITATIVE: D-Dimer, Quant: 2.13 ug/mL-FEU — ABNORMAL HIGH (ref 0.00–0.50)

## 2019-04-28 LAB — HEMOGLOBIN AND HEMATOCRIT, BLOOD
HCT: 24.8 % — ABNORMAL LOW (ref 36.0–46.0)
HCT: 24.9 % — ABNORMAL LOW (ref 36.0–46.0)
HCT: 26 % — ABNORMAL LOW (ref 36.0–46.0)
HCT: 28 % — ABNORMAL LOW (ref 36.0–46.0)
Hemoglobin: 7.6 g/dL — ABNORMAL LOW (ref 12.0–15.0)
Hemoglobin: 8.1 g/dL — ABNORMAL LOW (ref 12.0–15.0)
Hemoglobin: 8.3 g/dL — ABNORMAL LOW (ref 12.0–15.0)
Hemoglobin: 8.5 g/dL — ABNORMAL LOW (ref 12.0–15.0)

## 2019-04-28 LAB — PREPARE RBC (CROSSMATCH)

## 2019-04-28 LAB — GLUCOSE, CAPILLARY
Glucose-Capillary: 150 mg/dL — ABNORMAL HIGH (ref 70–99)
Glucose-Capillary: 152 mg/dL — ABNORMAL HIGH (ref 70–99)
Glucose-Capillary: 132 mg/dL — ABNORMAL HIGH (ref 70–99)
Glucose-Capillary: 141 mg/dL — ABNORMAL HIGH (ref 70–99)
Glucose-Capillary: 154 mg/dL — ABNORMAL HIGH (ref 70–99)

## 2019-04-28 LAB — FERRITIN: Ferritin: 43 ng/mL (ref 11–307)

## 2019-04-28 LAB — LACTATE DEHYDROGENASE: LDH: 244 U/L — ABNORMAL HIGH (ref 98–192)

## 2019-04-28 LAB — FIBRINOGEN: Fibrinogen: 220 mg/dL (ref 210–475)

## 2019-04-28 MED ORDER — HYDROMORPHONE HCL 1 MG/ML IJ SOLN: 0.5000 mg | INTRAMUSCULAR | Status: DC | PRN

## 2019-04-28 MED ORDER — ALBUMIN HUMAN 25 % IV SOLN
50.0000 g | Freq: Four times a day (QID) | INTRAVENOUS | Status: AC
  Administered 2019-04-28 – 2019-04-29 (×3): 50 g via INTRAVENOUS
  Filled 2019-04-28 (×2): qty 200

## 2019-04-28 MED ORDER — SODIUM CHLORIDE 0.9% IV SOLUTION
Freq: Once | INTRAVENOUS | Status: AC
  Administered 2019-04-28: 11:00:00 via INTRAVENOUS

## 2019-04-28 MED ORDER — SODIUM CHLORIDE 0.9 % IV BOLUS
250.0000 mL | Freq: Once | INTRAVENOUS | Status: AC
  Administered 2019-04-28: 07:00:00 250 mL via INTRAVENOUS

## 2019-04-28 MED ORDER — SODIUM CHLORIDE 0.9% IV SOLUTION
Freq: Once | INTRAVENOUS | Status: AC
  Administered 2019-04-28: 12:00:00 via INTRAVENOUS

## 2019-04-28 MED ORDER — IPRATROPIUM BROMIDE HFA 17 MCG/ACT IN AERS: 2.0000 | INHALATION_SPRAY | RESPIRATORY_TRACT | Status: DC | PRN

## 2019-04-28 MED ORDER — DIPHENHYDRAMINE HCL 50 MG/ML IJ SOLN: 50.0000 mg | Freq: Once | INTRAMUSCULAR | Status: AC

## 2019-04-28 MED ORDER — DIPHENHYDRAMINE HCL 50 MG/ML IJ SOLN
INTRAMUSCULAR | Status: AC
  Administered 2019-04-28: 50 mg via INTRAVENOUS
  Filled 2019-04-28: qty 1

## 2019-04-28 NOTE — Progress Notes (Signed)
Developed hives over stomach after second unit of blood.  No wheezing. Given benadryl, transfusion reaction labs sent.  Erskine Emery MD

## 2019-04-28 NOTE — Consult Note (Signed)
Chief Complaint: Patient was seen in consultation today for consideration of pelvic hemorrhage embolization  Chief Complaint  Patient presents with   Shortness of Breath   at the request of Dr Lazarus Salines   Supervising Physician: Gilmer Mor  Patient Status: [facility] - In-pt  History of Present Illness: Regina Munoz is a 68 y.o. female   Admitted with SOB +Covid 11/30 treating with Dexamethasone and remdesivir  Grew more SOB 12/9 CTA: +B PE Started on heparin drip  Developed pain in abd Drop in Hg from 11 to 9 CTA 12/12: Large pelvic hematoma is noted which appears to originate from the inferior portion of the left rectus muscle, most consistent with spontaneous hemorrhage, potentially related to anticoagulation. This causes displacement and compression of the urinary bladder. There is seen some irregular high density along the medial portion of the left inferior rectus muscle which may represent contrast extravasation and possibly active hemorrhage.  Pt now with Hg at 6.7 Has not had transfusion til today  abd pain is diffuse Tachy at 114 BP 107/89  Dr Loreta Ave has reviewed chart and imaging He has spoken to pt at length regarding possible embolization   Past Medical History:  Diagnosis Date   Anxiety    Arthritis    Asthma    Complication of anesthesia    COVID-19    Diabetes mellitus without complication (HCC)    Dysrhythmia    palpitations ->20 yrs stress test neg nothing since   GERD (gastroesophageal reflux disease)    Headache    PONV (postoperative nausea and vomiting)     Past Surgical History:  Procedure Laterality Date   ABDOMINAL HYSTERECTOMY     ANTERIOR CERVICAL DECOMP/DISCECTOMY FUSION N/A 02/17/2016   Procedure: ANTERIOR CERVICAL DECOMPRESSION/DISCECTOMY FUSION 1 LEVEL C4-5;  Surgeon: Venita Lick, MD;  Location: [facility] OR;  Service: Orthopedics;  Laterality: N/A;   EYE SURGERY Bilateral    cataracts   HIP CLOSED  REDUCTION Right 12/24/2012   Procedure: CLOSED REDUCTION HIP;  Surgeon: Emilee Hero, MD;  Location: [facility] ORS;  Service: Orthopedics;  Laterality: Right;   JOINT REPLACEMENT  2002   right and left knees   SHOULDER ARTHROSCOPY Left    TOTAL HIP ARTHROPLASTY Right    2002    Allergies: Patient has no known allergies.  Medications: Prior to Admission medications   Medication Sig Start Date End Date Taking? Authorizing Provider  acetaminophen (TYLENOL) 500 MG tablet Take 1,000 mg by mouth every 6 (six) hours as needed for mild pain or moderate pain.    Yes [provider]  aspirin EC 81 MG tablet Take 81 mg by mouth daily.   Yes [provider]  buPROPion (ZYBAN) 150 MG 12 hr tablet Take 75 mg by mouth every morning.  03/28/19  Yes [provider]  cetirizine (ZYRTEC) 10 MG tablet Take 10 mg by mouth daily. 02/13/19  Yes [provider]  cholecalciferol (VITAMIN D) 1000 UNITS tablet Take 1,000 Units by mouth daily.   Yes [provider]  diclofenac Sodium (VOLTAREN) 1 % GEL Apply 2 g topically daily as needed (for pain).    Yes [provider]  metFORMIN (GLUCOPHAGE) 500 MG tablet Take 500 mg by mouth 2 (two) times daily with a meal.    Yes [provider]  omeprazole (PRILOSEC) 20 MG capsule Take 20 mg by mouth daily.   Yes [provider]     History reviewed. No pertinent family history.  Social  History   Socioeconomic History   Marital status: Married    Spouse name: Not on file   Number of children: Not on file   Years of education: Not on file   Highest education level: Not on file  Occupational History   Not on file  Tobacco Use   Smoking status: Never Smoker   Smokeless tobacco: Never Used  Substance and Sexual Activity   Alcohol use: No   Drug use: No   Sexual activity: Not on file  Other Topics Concern   Not on file  Social History Narrative   Not on file   Social  Determinants of Health   Financial Resource Strain: Low Risk    Difficulty of Paying Living Expenses: Not hard at all  Food Insecurity: No Food Insecurity   Worried About Running Out of Food in the Last Year: Never true   Ran Out of Food in the Last Year: Never true  Transportation Needs: No Transportation Needs   Lack of Transportation (Medical): No   Lack of Transportation (Non-Medical): No  Physical Activity: Unknown   Days of Exercise per Week: 1 day   Minutes of Exercise per Session: Not on file  Stress:    Feeling of Stress : Not on file  Social Connections:    Frequency of Communication with Friends and Family: Not on file   Frequency of Social Gatherings with Friends and Family: Not on file   Attends Religious Services: Not on file   Active Member of Clubs or Organizations: Not on file   Attends Banker Meetings: Not on file   Marital Status: Not on file     Review of Systems: A 12 point ROS discussed and pertinent positives are indicated in the HPI above.  All other systems are negative.  Review of Systems  Constitutional: Positive for activity change and fatigue. Negative for fever.  Respiratory: Positive for shortness of breath.   Cardiovascular: Negative for chest pain.  Gastrointestinal: Positive for abdominal pain.  Neurological: Positive for weakness.  Psychiatric/Behavioral: Negative for behavioral problems and confusion.    Vital Signs: BP 107/89 (BP Location: Left Arm)    Pulse (!) 114    Temp 99.4 F (37.4 C) (Oral)    Resp (!) 23    Ht 5\' 3"  (1.6 m)    Wt 238 lb 8.6 oz (108.2 kg)    SpO2 93%    BMI 42.26 kg/m   Physical Exam Vitals reviewed.  Cardiovascular:     Rate and Rhythm: Regular rhythm. Tachycardia present.  Pulmonary:     Breath sounds: Normal breath sounds.  Abdominal:     Tenderness: There is abdominal tenderness.  Musculoskeletal:        General: Normal range of motion.     Right lower leg: No edema.      Left lower leg: No edema.  Skin:    General: Skin is warm and dry.  Neurological:     Mental Status: She is alert and oriented to person, place, and time.  Psychiatric:        Behavior: Behavior normal.     Imaging: CT ANGIO CHEST PE W OR WO CONTRAST  Result Date: 04/24/2019 CLINICAL DATA:  Hypoxemia, cough and shortness of breath. COVID-19 pneumonia. EXAM: CT ANGIOGRAPHY CHEST WITH CONTRAST TECHNIQUE: Multidetector CT imaging of the chest was performed using the standard protocol during bolus administration of intravenous contrast. Multiplanar CT image reconstructions and MIPs were obtained to evaluate the vascular anatomy.  CONTRAST:  75mL OMNIPAQUE IOHEXOL 350 MG/ML SOLN COMPARISON:  None. FINDINGS: Cardiovascular: --Pulmonary arteries: Contrast injection is sufficient to demonstrate satisfactory opacification of the pulmonary arteries to the segmental level, with attenuation of at least 200 HU at the main pulmonary artery. There is a large pulmonary embolus clot burden within both the right and left main pulmonary arteries extending into all proximal lobar branches. There is evidence of right heart strain with an RV to LV ratio of 1.65. the main pulmonary artery is within normal limits for size. --Aorta: Satisfactory opacification of the thoracic aorta. No aortic dissection or other acute aortic syndrome. Conventional 3 vessel aortic branching pattern. There is no aortic atherosclerosis. --Heart: Normal size. No pericardial effusion. Mediastinum/Nodes: No mediastinal, hilar or axillary lymphadenopathy. The visualized thyroid and thoracic esophageal course are unremarkable. Lungs/Pleura: Mild ground-glass opacity without focal consolidation. Upper Abdomen: Contrast bolus timing is not optimized for evaluation of the abdominal organs. There are stones within the gallbladder. Musculoskeletal: No chest wall abnormality. No bony spinal canal stenosis. Review of the MIP images confirms the above findings.  IMPRESSION: 1. Massive pulmonary embolus within both main pulmonary arteries extending into all proximal lobar branches with CT evidence of right heart strain (RV/LV Ratio = 1.65) consistent with at least submassive (intermediate risk) PE. The presence of right heart strain has been associated with an increased risk of morbidity and mortality. Please activate Code PE by paging [phone]. 2. Multifocal pulmonary ground-glass opacity without specific evidence of infection 3. Cholelithiasis. Critical Value/emergent results were called by telephone at the time of interpretation on 04/24/2019 at 9:38 pm to provider K. Craige Cotta, who verbally acknowledged these results. Electronically Signed   By: Deatra Robinson M.D.   On: 04/24/2019 21:38   DG CHEST PORT 1 VIEW  Result Date: 04/24/2019 CLINICAL DATA:  68 year old with COVID-19 pneumonia, now with worsening hypoxia. EXAM: PORTABLE CHEST 1 VIEW COMPARISON:  04/21/2019. FINDINGS: Cardiac silhouette upper normal in size to mildly enlarged, unchanged. Improved aeration in the lung bases, with residual mild streaky opacities at the LEFT base. No new pulmonary parenchymal abnormalities. Normal pulmonary vascularity. No visible pleural effusions. IMPRESSION: 1. Improved aeration in the lung bases, with residual mild streaky atelectasis and/or bronchopneumonia at the LEFT base. 2. No new abnormalities. Electronically Signed   By: Hulan Saas M.D.   On: 04/24/2019 17:07   DG Chest Port 1 View  Result Date: 04/21/2019 CLINICAL DATA:  68 year old female with shortness of breath. EXAM: PORTABLE CHEST 1 VIEW COMPARISON:  Chest radiograph report dated 02/28/2001. The images are not available for direct comparison. FINDINGS: Minimal bibasilar, left greater than right, densities likely atelectatic changes. Atypical infiltrate is less likely but not excluded. Clinical correlation is recommended. There is no focal consolidation, pleural effusion, pneumothorax. The cardiac  silhouette is within normal limits. No acute osseous pathology. IMPRESSION: Minimal bibasilar atelectasis.  No focal consolidation. Electronically Signed   By: Elgie Collard M.D.   On: 04/21/2019 17:45   VAS [place] LOWER EXTREMITY VENOUS (DVT)  Result Date: 04/27/2019  Lower Venous Study Indications: Pulmonary embolism.  Risk Factors: Large pelvic hematoma is noted which appears to originate from the. Limitations: Body habitus. Comparison Study: No prior study on file for comparison. Performing Technologist: Sherren Kerns RVS  Examination Guidelines: A complete evaluation includes B-mode imaging, spectral Doppler, color Doppler, and power Doppler as needed of all accessible portions of each vessel. Bilateral testing is considered an integral part of a complete examination. Limited examinations for reoccurring indications may be  performed as noted.  +---------+---------------+---------+-----------+----------+--------------+  RIGHT     Compressibility Phasicity Spontaneity Properties Thrombus Aging  +---------+---------------+---------+-----------+----------+--------------+  CFV       Full            Yes       Yes                                    +---------+---------------+---------+-----------+----------+--------------+  SFJ       Full                                                             +---------+---------------+---------+-----------+----------+--------------+  FV Prox   Full                                                             +---------+---------------+---------+-----------+----------+--------------+  FV Mid    Full                                                             +---------+---------------+---------+-----------+----------+--------------+  FV Distal Full                                                             +---------+---------------+---------+-----------+----------+--------------+  PFV       Full                                                              +---------+---------------+---------+-----------+----------+--------------+  POP       Full            Yes       Yes                                    +---------+---------------+---------+-----------+----------+--------------+  PTV       Full                                                             +---------+---------------+---------+-----------+----------+--------------+  PERO      Full                                                             +---------+---------------+---------+-----------+----------+--------------+   +---------+---------------+---------+-----------+----------+-----------------+  LEFT      Compressibility Phasicity Spontaneity Properties Thrombus Aging     +---------+---------------+---------+-----------+----------+-----------------+  CFV       Full            Yes       Yes                                       +---------+---------------+---------+-----------+----------+-----------------+  SFJ       Full                                                                +---------+---------------+---------+-----------+----------+-----------------+  FV Prox   Full                                                                +---------+---------------+---------+-----------+----------+-----------------+  FV Mid    Full                                                                +---------+---------------+---------+-----------+----------+-----------------+  FV Distal Full                                                                +---------+---------------+---------+-----------+----------+-----------------+  PFV       Full                                                                +---------+---------------+---------+-----------+----------+-----------------+  POP       Full            No        No                                        +---------+---------------+---------+-----------+----------+-----------------+  PTV       Full                                             Age  Indeterminate  +---------+---------------+---------+-----------+----------+-----------------+  PERO      Full  Age Indeterminate  +---------+---------------+---------+-----------+----------+-----------------+     Summary: Right: There is no evidence of deep vein thrombosis in the lower extremity. Left: Findings consistent with age indeterminate deep vein thrombosis involving the left posterior tibial veins, and left peroneal veins.  *See table(s) above for measurements and observations.    Preliminary    ECHOCARDIOGRAM LIMITED  Result Date: 04/25/2019   ECHOCARDIOGRAM LIMITED REPORT   Patient Name:   NARY SNEED Date of Exam: 04/25/2019 Medical Rec #:  [id number]         Height:       63.0 in Accession #:    [id number]        Weight:       246.5 lb Date of Birth:  1951/01/30         BSA:          2.11 m Patient Age:    68 years          BP:           126/70 mmHg Patient Gender: F                 HR:           79 bpm. Exam Location:  Inpatient  Procedure: Limited Echo, Limited Color Doppler and Cardiac Doppler STAT ECHO Indications:    pulmonary embolus 415.19  History:        Patient has no prior history of Echocardiogram examinations.  Sonographer:    Delcie Roch Referring Phys: [id number] CAROLE N HALL IMPRESSIONS  1. Left ventricular ejection fraction, by visual estimation, is 50 to 55%. The left ventricle has normal function. There is no left ventricular hypertrophy.  2. Left ventricular diastolic parameters are consistent with Grade I diastolic dysfunction (impaired relaxation).  3. The left ventricle has no regional wall motion abnormalities.  4. Global right ventricle has normal systolic function.The right ventricular size is normal.  5. Left atrial size was normal.  6. Right atrial size was normal.  7. The mitral valve is normal in structure. No evidence of mitral valve regurgitation. No evidence of mitral stenosis.  8. The tricuspid valve is normal in  structure. Tricuspid valve regurgitation is trivial.  9. The aortic valve is tricuspid. Aortic valve regurgitation is not visualized. Mild aortic valve sclerosis without stenosis. 10. The pulmonic valve was normal in structure. Pulmonic valve regurgitation is not visualized. 11. Mildly elevated pulmonary artery systolic pressure. 12. The inferior vena cava is normal in size with greater than 50% respiratory variability, suggesting right atrial pressure of 3 mmHg. 13. Low normal LV systolic function; grade 1 diastolic dysfunction; moderate RV dysfunction. FINDINGS  Left Ventricle: Left ventricular ejection fraction, by visual estimation, is 50 to 55%. The left ventricle has normal function. The left ventricle has no regional wall motion abnormalities. There is no left ventricular hypertrophy. Left ventricular diastolic parameters are consistent with Grade I diastolic dysfunction (impaired relaxation). Normal left atrial pressure. Right Ventricle: The right ventricular size is normal.Global RV systolic function is has normal systolic function. The tricuspid regurgitant velocity is 2.82 m/s, and with an assumed right atrial pressure of 3 mmHg, the estimated right ventricular systolic pressure is mildly elevated at 34.8 mmHg. Left Atrium: Left atrial size was normal in size. Right Atrium: Right atrial size was normal in size Pericardium: There is no evidence of pericardial effusion. Mitral Valve: The mitral valve is normal in structure. No evidence of mitral valve stenosis by observation. MV Area by PHT, 4.31 cm.  MV PHT, 51.04 msec. No evidence of mitral valve regurgitation. Tricuspid Valve: The tricuspid valve is normal in structure. Tricuspid valve regurgitation is trivial. Aortic Valve: The aortic valve is tricuspid. Aortic valve regurgitation is not visualized. Mild aortic valve sclerosis is present, with no evidence of aortic valve stenosis. Pulmonic Valve: The pulmonic valve was normal in structure. Pulmonic  valve regurgitation is not visualized. Aorta: The aortic root is normal in size and structure. Venous: The inferior vena cava is normal in size with greater than 50% respiratory variability, suggesting right atrial pressure of 3 mmHg.  LEFT VENTRICLE          Normals PLAX 2D LVIDd:         4.10 cm  3.6 cm   Diastology                 Normals LVIDs:         3.20 cm  1.7 cm   LV e' lateral:   8.27 cm/s 6.42 cm/s LV PW:         0.90 cm  1.4 cm   LV E/e' lateral: 8.4       15.4 LV IVS:        1.00 cm  1.3 cm   LV e' medial:    6.85 cm/s 6.96 cm/s LVOT diam:     1.90 cm  2.0 cm   LV E/e' medial:  10.2      6.96 LV SV:         33 ml    79 ml LV SV Index:   14.51    45 ml/m2 LVOT Area:     2.84 cm 3.14 cm2  RIGHT VENTRICLE RV S prime:     15.60 cm/s TAPSE (M-mode): 2.1 cm LEFT ATRIUM         Index LA diam:    3.20 cm 1.51 cm/m  AORTIC VALVE             Normals LVOT Vmax:   78.30 cm/s LVOT Vmean:  46.200 cm/s 75 cm/s LVOT VTI:    0.134 m     25.3 cm  AORTA                 Normals Ao Root diam: 3.10 cm 31 mm MITRAL VALVE              Normals   TRICUSPID VALVE             Normals MV Area (PHT): 4.31 cm             TR Peak grad:   31.8 mmHg MV PHT:        51.04 msec 55 ms     TR Vmax:        282.00 cm/s 288 cm/s MV Decel Time: 176 msec   187 ms MV E velocity: 69.80 cm/s 103 cm/s  SHUNTS MV A velocity: 78.80 cm/s 70.3 cm/s Systemic VTI:  0.13 m MV E/A ratio:  0.89       1.5       Systemic Diam: 1.90 cm  Regina Millers MD Electronically signed by Regina Millers MD Signature Date/Time: 04/25/2019/3:06:36 PM    Final    CT Angio Abd/Pel w/ and/or w/o  Result Date: 04/27/2019 CLINICAL DATA:  Acute generalized abdominal pain. EXAM: CTA ABDOMEN AND PELVIS WITHOUT AND WITH CONTRAST TECHNIQUE: Multidetector CT imaging of the abdomen and pelvis was performed using the standard protocol during bolus administration of intravenous contrast. Multiplanar  reconstructed images and MIPs were obtained and reviewed to evaluate the  vascular anatomy. CONTRAST:  135mL OMNIPAQUE IOHEXOL 350 MG/ML SOLN COMPARISON:  None. FINDINGS: VASCULAR Aorta: Normal caliber aorta without aneurysm, dissection, vasculitis or significant stenosis. Celiac: Patent without evidence of aneurysm, dissection, vasculitis or significant stenosis. SMA: Patent without evidence of aneurysm, dissection, vasculitis or significant stenosis. Renals: Both renal arteries are patent without evidence of aneurysm, dissection, vasculitis, fibromuscular dysplasia or significant stenosis. IMA: Patent without evidence of aneurysm, dissection, vasculitis or significant stenosis. Inflow: Patent without evidence of aneurysm, dissection, vasculitis or significant stenosis. Proximal Outflow: Bilateral common femoral and visualized portions of the superficial and profunda femoral arteries are patent without evidence of aneurysm, dissection, vasculitis or significant stenosis. Veins: No obvious venous abnormality within the limitations of this arterial phase study. Review of the MIP images confirms the above findings. NON-VASCULAR Lower chest: No acute abnormality. Hepatobiliary: Cholelithiasis is noted. No biliary dilatation is noted. Minimally nodular hepatic margins are noted suggesting possible early hepatic cirrhosis. Pancreas: Unremarkable. No pancreatic ductal dilatation or surrounding inflammatory changes. Spleen: Normal in size without focal abnormality. Adrenals/Urinary Tract: Adrenal glands and kidneys appear normal. No hydronephrosis is noted. Urinary bladder appears to be compressed by large pelvic hematoma. Stomach/Bowel: The stomach appears normal. There is no evidence of bowel obstruction or inflammation. The appendix is not visualized. Lymphatic: No adenopathy is noted. Reproductive: Status post hysterectomy. No adnexal masses. Other: There is a large complex lobulated density in the left in anterior portion of the pelvis which appears to arise from inferior portion of left  rectal sheath, most consistent with hemorrhage. There is noted some hyperdensity along the medial portion of the left inferior rectus sheath which may represent contrast extravasation and possible active hemorrhage. Musculoskeletal: No acute or significant osseous findings. IMPRESSION: VASCULAR Large pelvic hematoma is noted which appears to originate from the inferior portion of the left rectus muscle, most consistent with spontaneous hemorrhage, potentially related to anticoagulation. This causes displacement and compression of the urinary bladder. There is seen some irregular high density along the medial portion of the left inferior rectus muscle which may represent contrast extravasation and possibly active hemorrhage. Critical Value/emergent results were called by telephone at the time of interpretation on 04/27/2019 at 5:05 pm to [facility] , who verbally acknowledged these results. Mild cholelithiasis is noted. Minimally nodular hepatic margins are noted suggesting possible hepatic cirrhosis. Electronically Signed   By: Marijo Conception M.D.   On: 04/27/2019 17:14    Labs:  CBC: Recent Labs    04/26/19 0500 04/27/19 0419 04/27/19 1548 04/27/19 1901 04/27/19 2345 04/28/19 0445 04/28/19 0652  WBC 8.0 8.2 17.5*  --   --   --  12.3*  HGB 12.0 11.4* 9.8* 9.7* 8.3* 7.6* 6.7*  HCT 38.7 36.7 32.2* 34.2* 28.0* 24.9* 22.0*  PLT 147* 154 218  --   --   --  149*    COAGS: Recent Labs    04/27/19 2018  INR 1.4*    BMP: Recent Labs    04/26/19 0500 04/27/19 0419 04/27/19 1548 04/28/19 0445  NA 136 135 138 140  K 5.5* 5.0 4.4 4.6  CL 102 102 105 108  CO2 23 25 21* 20*  GLUCOSE 283* 313* 210* 181*  BUN 15 16 23 20   CALCIUM 8.9 8.6* 8.3* 8.8*  CREATININE 0.83 0.87 1.07* 1.01*  GFRNONAA >60 >60 54* 58*  GFRAA >60 >60 >60 >60    LIVER FUNCTION TESTS: Recent Labs  04/25/19 0500 04/26/19 0500 04/27/19 0419 04/28/19 0445  BILITOT 1.0 0.6 0.9 1.5*  AST 23 35 23 37    ALT 16 19 17 22   ALKPHOS 75 86 79 47  PROT 5.8* 6.7 6.3* 6.5  ALBUMIN 2.4* 2.6* 2.6* 4.2    TUMOR MARKERS: No results for input(s): AFPTM, CEA, CA199, CHROMGRNA in the last 8760 hours.  Assessment and Plan:  Left rectus sheath hematoma Hg now 6.7 Transfusing now per MD Will receck Hg after Tx If drop noted or change in sxs-- worsening pain- will need stat Non Cx CT Abd/Pelv per Dr Loreta Ave He has spoken to pt regarding procedure of pelvic hemorrhage embolization Please have MD call Dr Loreta Ave at [phone] for discussion of plan  Thank you for this interesting consult.  I greatly enjoyed meeting Regina Munoz and look forward to participating in their care.  A copy of this report was sent to the requesting provider on this date.  Electronically Signed: Robet Leu, PA-C 04/28/2019, 10:06 AM   I spent a total of 40 Minutes    in face to face in clinical consultation, greater than 50% of which was counseling/coordinating care for pelvic hemorrhage embolization

## 2019-04-28 NOTE — Progress Notes (Signed)
Updated the patient's spouse Mr. Nathaneil Canary via phone. He would like to speak or see his wife via I-pad.  All questions answered to his satisfaction.  Will keep him informed.

## 2019-04-28 NOTE — Progress Notes (Addendum)
NAME:  Regina Munoz, MRN:  [id number], DOB:  Oct 30, 1950, LOS: 7 ADMISSION DATE:  04/21/2019, CONSULTATION DATE: 04/27/2019 REFERRING MD: Dr. Margo Aye, CHIEF COMPLAINT: Bilateral pulmonary embolism, rectus muscle hematoma  Brief History   Patient was admitted with difficulty breathing Recent positive Covid 19 11/30 Admitted for COVID-19 pneumonia started on dexamethasone and remdesivir Increased hypoxemia on 12/ 9-patient had a CT angio showing pulmonary embolism Patient was started on heparin This morning started complaining of abdominal pain, had a CT scan of the abdomen revealing wall hematoma ICU consulted  History of present illness   COVID-19 pneumonia Pulmonary embolism 12 /9 Rectus sheath/pelvic hematoma12/12   Past Medical History   Past Medical History:  Diagnosis Date  . Anxiety   . Arthritis   . Asthma   . Complication of anesthesia   . COVID-19   . Diabetes mellitus without complication (HCC)   . Dysrhythmia    palpitations ->20 yrs stress test neg nothing since  . GERD (gastroesophageal reflux disease)   . Headache   . PONV (postoperative nausea and vomiting)      Significant Hospital Events   Pulmonary embolism 12/9 Rectus sheath hematoma 12/12 Consults:  PCCM  Procedures:  None  Significant Diagnostic Tests:  1. Massive pulmonary embolus within both main pulmonary arteries extending into all proximal lobar branches with CT evidence of right heart strain (RV/LV Ratio = 1.65) consistent with at least submassive (intermediate risk) PE. The presence of right heart strain has been associated with an increased risk of morbidity and mortality. Please activate Code PE by paging [phone]. 2. Multifocal pulmonary ground-glass opacity without specific evidence of infection 3. Cholelithiasis.  12/12 IMPRESSION: VASCULAR  Large pelvic hematoma is noted which appears to originate from the inferior portion of the left rectus muscle, most consistent  with spontaneous hemorrhage, potentially related to anticoagulation. This causes displacement and compression of the urinary bladder. There is seen some irregular high density along the medial portion of the left inferior rectus muscle which may represent contrast extravasation and possibly active hemorrhage. Critical Value/emergent results were called by telephone at the time of interpretation on 04/27/2019 at 5:05 pm to providerCAROLE HALL , who verbally acknowledged these results.  Mild cholelithiasis is noted.  Minimally nodular hepatic margins are noted suggesting possible hepatic cirrhosis.  Echocardiogram 1210 Normal right-sided heart function Micro Data:  SARS coronavirus +11/30  Antimicrobials:  Remdesivir  Interim history/subjective:  Complain of abdominal wall pain to left hand this morning, now 7/10  Objective   Blood pressure 107/89, pulse (!) 114, temperature 99.4 F (37.4 C), temperature source Oral, resp. rate (!) 23, height 5\' 3"  (1.6 m), weight 108.2 kg, SpO2 93 %.        Intake/Output Summary (Last 24 hours) at 04/28/2019 04/30/2019 Last data filed at 04/28/2019 0800 Gross per 24 hour  Intake 885 ml  Output 400 ml  Net 485 ml   Filed Weights   04/22/19 0604 04/25/19 0326 04/27/19 2047  Weight: 107 kg 111.8 kg 108.2 kg    Examination: General: Middle-age lady, does not appear to be in distress HENT: Moist oral mucosa Lungs: Clear breath sounds Cardiovascular: S1-S2 appreciated Abdomen: Soft, bowel sounds appreciated Extremities: No clubbing, no edema Neuro: Alert and oriented x3 GU:   Resolved Hospital Problem list     Assessment & Plan:  Overall in tough spot with large VTE burden and large bleed.  Complicated by deconditioning and body habitus.  Keep in ICU today.  Large pelvic hematoma -CT  showing that there may be a component of active hemorrhage -Interventional radiology consulted, want to watch and see if responds to transfusion  otherwise to go for angiography (and hopefully IVC filter placement) - Remains HD stable - Aspirin and AC on hold - 2 units pRBCs ordered this AM  Submassive pulmonary embolism -Strain noted on CT -Echo shows normal right-sided function -Was being anticoagulated with heparin -Complicated by pelvic hematoma -Anticoagulation on hold -Pretty small tibeal clots on duplex yesterday but I think filter is reasonable as resumption of AC may be a good time from now; will d/w interventional radiology if/when they come by again  Hypotension -Resolved  COVID-19 pneumonia -Complete course of remdesivir -Complete course of dexamethasone - In theory should be outside infectious stage  Best practice:  Diet: As tolerated Pain/Anxiety/Delirium protocol (if indicated): oxycodone moderate, dilaudid severe VAP protocol (if indicated): Not indicated DVT prophylaxis: on hold Glucose control: Monitor Mobility: Bedrest Code Status: Full code Family Communication: Per primary Disposition: ICU   Critical care time: 64min   Erskine Emery MD PCCM

## 2019-04-28 NOTE — Progress Notes (Signed)
Dr. Erskine Emery informed of patient's HGB of 6.7. See new orders.

## 2019-04-28 NOTE — Progress Notes (Signed)
Assisted tele visit to patient with family member.  Melody Savidge P, RN  

## 2019-04-28 NOTE — Progress Notes (Addendum)
PROGRESS NOTE  Regina Munoz UUV:[id number] DOB: 19-Jul-1950 DOA: 04/21/2019 PCP: [name], DO  HPI/Recap of past 64 hours: 68 year old female with past medical history significant for type 2 diabetes, severe obesity, asthma and anxiety who presented to the ED from home due to difficulty breathing and cough.  Associated with fever chills nausea vomiting loss of taste and diarrhea.  Symptoms started 2 weeks ago and have progressed.  Patient spouse and son tested positive for COVID-19.  Patient tested positive for COVID-19 on 04/15/2019.  Hypoxic on presentation with O2 saturation in the 80s on room air.  Chest x-ray showed patchy infiltrates worse at bases.  Started on remdesivir and Decadron.  Admitted for acute COVID-19 viral pneumonia.  Hospital course complicated by significant hypoxia with ambulation with O2 saturation dropping in the 70s.  CTA chest positive for massive bilateral pulmonary embolism.  Started on heparin drip overnight.  Obtain troponin, mildly elevated, 2D echo is pending.    PCCM contacted, curbsided and discussed with Dr. [name] 12/10.  Hemodynamically stable at this time, recommended to continue heparin drip for 3 days then transition to DOAC.  Will start Eliquis when off heparin drip likely 04/28/2019.  Hospital course complicated by left lower quadrant abdominal pain for which she had a CT abdomen and pelvis done without contrast.  Revealing spontaneous left pelvic hematoma.  Interventional radiology consulted for possible embolization.  H&H every 4 hours revealing persistent drop in hemoglobin.  Hemoglobin down to 6.7.  Will transfuse 2 unit PRBC on 04/28/2019.  Repeat CT abdomen and pelvis without contrast to reassess left pelvic hematoma.  04/28/19: Seen and examined.  Reports left lower quadrant abdominal pain.  Pain management in place.  States she feels tired and wants to go home.  Hg dropping and BP is soft. On IV fluid NS and IV albumin to maintain her blood  pressure. She is alert and oriented x3.  PCCM and interventional radiology following.  Appreciate specialists assistance.   Assessment/Plan: Active Problems:   Pneumonia due to COVID-19 virus   Acute COVID-19 viral pneumonia Initially hypoxemic on presentation with O2 saturation in the 80s on room air.   Covid -19 positive on 04/15/19 Significant drop in oxygen saturation with minimal ambulation >> in the 70's reported by bedside RN Katrinka Blazing on 04/24/2019 which prompted CTA chest >>revealed submassive B/L PE. Started on hep drip. On 04/27/19 had LLQ abd pain which prompted CTA abd pelvis which showed large left pelvic spontaneous hematoma. Heparin drip, ASA and decadron held. IR and PCCM consulted and transferred to the ICU Completed 5 days of Remdesivir and 5 days of IV decadron 6 mg daily. Continue albuterol inhaler and ipratropium inhaler 3 times daily Continue vitamin C, D3 and zinc Continue incentive spirometer Continue to trend inflammatory markers.  Acute hypoxic respiratory failure multifactorial secondary to acute COVID-19 viral pneumonia and acute massive bilateral pulmonary embolism with right heart strain on CT. Management as stated above. Home O2 evaluation for DC planning  Newly diagnosed acute massive bilateral pulmonary embolism with right heart strain Independently reviewed CT a chest which shows pulmonary embolism affecting both pulmonary arteries extending into all proximal lobar branches.  2D echo completed showed preserved LVEF and G1DD  Was on hep drip day#3 when found to have left pelvic hematoma. Hep drip discontinued on 04/27/19. IR consulted for possible IVC filter placement  Newly diagnosed spontaneous large left pelvic hematoma LLQ abd pain on 04/27/19>>CT abd pelvis w contrast 04/27/19 showed above findings. Hep drip,  ASA and decadron stopped PCCM, IR consulted and transferred to ICU Hg dropped to 6.7 Transfusing 2U PRBCs Repeat H&H post transfusions Repeat  CT abd pelvis to reassess hematoma IR following   Newly diagnosed LLE DVT, age indeterminate On B/L duplex US done on 12/12 IR for possible IVC filter placement No SCDs and No anticoagulation due to large spontaneous pelvic hematoma  Hypotension in the setting of acute blood loss Maintain MAP greater than 65 Received 2 L IV fluid boluses on 04/27/2019. Received a bolus of normal saline 250 cc once today On IV fluid hydration normal saline at 75 cc/h and IV Albumin 50 g every 6 hours x4 doses Continue to closely monitor vital signs  Acute blood loss secondary to spontaneous large left pelvic hematoma Hemoglobin down to 6.7 on 04/28/2019 Continue to monitor H&H every 4 hours 2 unit PRBCs ordered to be transfused on 04/28/2019. Maintain hemoglobin greater than 8 Reassess hematoma with repeat CT abdomen and pelvis  Elevated troponin likely demand ischemia from acute massive bilateral PE. Troponin is peaked at 32 then trended down. No anginal symptoms  Resolved sinus tachycardia likely secondary to acute PE  Prolonged QTC Avoid QTC prolonging agents Monitor with twelve-lead EKG as needed  Type 2 diabetes with hyperglycemia Hemoglobin A1c 7.8 Uncontrolled hyperglycemia exacerbated by Decadron Increased Lantus dosing to 10 units daily Continue NovoLog 7 units 3 times daily Continue insulin sliding scale.  Chronic anxiety/depression Continue home medication  GERD Continue home medication  Obesity BMI 41 Recommend weight loss outpatient with regular physical activity and healthy diet daily once healed from COVID-19 viral pneumonia. PT rec HHPT   DVT prophylaxis:  No SCDs due to DVT; no chemical DVT PPX due to spontaneous large left pelvic hematoma Code Status: Full code Family Communication:  Updated her husband via phone on 04/27/19  Disposition Plan:  Undetermined due to ongoing workup and treatment of present condition. Patient is critically ill.  Consults called:  PCCM, IR   Objective: Vitals:   04/28/19 0900 04/28/19 1000 04/28/19 1030 04/28/19 1045  BP: (!) 134/50 (!) 143/59 90/63 118/60  Pulse: (!) 120 (!) 114 (!) 106 (!) 106  Resp: (!) 28 (!) 28 (!) 27 (!) 27  Temp:   100.2 F (37.9 C) 100.2 F (37.9 C)  TempSrc:   Oral Oral  SpO2: 97% 97% 97% 97%  Weight:      Height:        Intake/Output Summary (Last 24 hours) at 04/28/2019 1106 Last data filed at 04/28/2019 0800 Gross per 24 hour  Intake 885 ml  Output 400 ml  Net 485 ml   Filed Weights   04/22/19 0604 04/25/19 0326 04/27/19 2047  Weight: 107 kg 111.8 kg 108.2 kg    Exam:  . General: 68 y.o. year-old female obese appears uncomfortable due to left lower quadrant abdominal pain.  Alert and oriented x3. . Cardiovascular: Regular rate and rhythm no rubs or gallops no JVD or thyromegaly noted. Marland Kitchen  Respiratory: Mild rales at bases.  No wheezing noted.  Poor inspiratory effort.   Abdomen: Obese tenderness left lower quadrant with mild palpation.  Bowel sounds present. Musculoskeletal: No lower extremity edema.  2 out of 4 pulses in all 4 extremities. Psychiatry: Mood is appropriate for condition and setting. Data Reviewed: CBC: Recent Labs  Lab 04/24/19 0500 04/25/19 0500 04/26/19 0500 04/27/19 0419 04/27/19 1548 04/27/19 1901 04/27/19 2345 04/28/19 0445 04/28/19 0652  WBC 7.9 7.0 8.0 8.2 17.5*  --   --   --  12.3*  NEUTROABS 6.0 4.7 6.1 6.3  --   --   --   --  7.9*  HGB 11.6* 11.3* 12.0 11.4* 9.8* 9.7* 8.3* 7.6* 6.7*  HCT 37.2 36.8 38.7 36.7 32.2* 34.2* 28.0* 24.9* 22.0*  MCV 77.3* 77.6* 78.5* 77.3* 80.1  --   --   --  82.1  PLT 128* 136* 147* 154 218  --   --   --  149*   Basic Metabolic Panel: Recent Labs  Lab 04/25/19 0500 04/26/19 0500 04/27/19 0419 04/27/19 1548 04/28/19 0445  NA 138 136 135 138 140  K 4.8 5.5* 5.0 4.4 4.6  CL 105 102 102 105 108  CO2 26 23 25  21* 20*  GLUCOSE 250* 283* 313* 210* 181*  BUN 14 15 16 23 20   CREATININE 0.85 0.83 0.87  1.07* 1.01*  CALCIUM 8.2* 8.9 8.6* 8.3* 8.8*   GFR: Estimated Creatinine Clearance: 63.7 mL/min (A) (by C-G formula based on SCr of 1.01 mg/dL (H)). Liver Function Tests: Recent Labs  Lab 04/24/19 0500 04/25/19 0500 04/26/19 0500 04/27/19 0419 04/28/19 0445  AST 22 23 35 23 37  ALT 13 16 19 17 22   ALKPHOS 75 75 86 79 47  BILITOT 0.8 1.0 0.6 0.9 1.5*  PROT 6.2* 5.8* 6.7 6.3* 6.5  ALBUMIN 2.3* 2.4* 2.6* 2.6* 4.2   No results for input(s): LIPASE, AMYLASE in the last 168 hours. No results for input(s): AMMONIA in the last 168 hours. Coagulation Profile: Recent Labs  Lab 04/27/19 2018  INR 1.4*   Cardiac Enzymes: No results for input(s): CKTOTAL, CKMB, CKMBINDEX, TROPONINI in the last 168 hours. BNP (last 3 results) No results for input(s): PROBNP in the last 8760 hours. HbA1C: No results for input(s): HGBA1C in the last 72 hours. CBG: Recent Labs  Lab 04/27/19 0751 04/27/19 1213 04/27/19 1635 04/27/19 2238 04/28/19 0801  GLUCAP 273* 233* 219* 166* 150*   Lipid Profile: No results for input(s): CHOL, HDL, LDLCALC, TRIG, CHOLHDL, LDLDIRECT in the last 72 hours. Thyroid Function Tests: No results for input(s): TSH, T4TOTAL, FREET4, T3FREE, THYROIDAB in the last 72 hours. Anemia Panel: Recent Labs    04/27/19 0419 04/28/19 0652  FERRITIN 36 43   Urine analysis: No results found for: COLORURINE, APPEARANCEUR, LABSPEC, PHURINE, GLUCOSEU, HGBUR, BILIRUBINUR, KETONESUR, PROTEINUR, UROBILINOGEN, NITRITE, LEUKOCYTESUR Sepsis Labs: @LABRCNTIP (procalcitonin:4,lacticidven:4)  ) Recent Results (from the past 240 hour(s))  Blood Culture (routine x 2)     Status: None   Collection Time: 04/21/19  5:38 PM   Specimen: BLOOD LEFT ARM  Result Value Ref Range Status   Specimen Description BLOOD LEFT ARM  Final   Special Requests   Final    BOTTLES DRAWN AEROBIC AND ANAEROBIC Blood Culture adequate volume   Culture   Final    NO GROWTH 5 DAYS Performed at [facility], [address], [place], 14/06/20 [facility]    Report Status 04/26/2019 FINAL  Final  Blood Culture (routine x 2)     Status: None   Collection Time: 04/21/19  5:38 PM   Specimen: Right Antecubital; Blood  Result Value Ref Range Status   Specimen Description RIGHT ANTECUBITAL  Final   Special Requests   Final    BOTTLES DRAWN AEROBIC AND ANAEROBIC Blood Culture adequate volume   Culture   Final    NO GROWTH 5 DAYS Performed at [facility], [address], [place], 14/06/20 [facility]    Report Status 04/26/2019 FINAL  Final  MRSA PCR  Screening     Status: None   Collection Time: 04/23/19 12:35 PM   Specimen: Nasal Mucosa; Nasopharyngeal  Result Value Ref Range Status   MRSA by PCR NEGATIVE NEGATIVE Final    Comment:        The GeneXpert MRSA Assay (FDA approved for NASAL specimens only), is one component of a comprehensive MRSA colonization surveillance program. It is not intended to diagnose MRSA infection nor to guide or monitor treatment for MRSA infections. Performed at [facility], [place] [address], [place], [place] [address]       Studies: VAS [place] LOWER EXTREMITY VENOUS (DVT)  Result Date: 04/28/2019  Lower Venous Study Indications: Pulmonary embolism. Other Indications: Covid positive. Risk Factors: Large pelvic hematoma is noted which appears to originate from the. Limitations: Body habitus. Comparison Study: No prior study on file for comparison. Performing Technologist: Sharion Dove RVS  Examination Guidelines: A complete evaluation includes B-mode imaging, spectral Doppler, color Doppler, and power Doppler as needed of all accessible portions of each vessel. Bilateral testing is considered an integral part of a complete examination. Limited examinations for reoccurring indications may be performed as noted.  +---------+---------------+---------+-----------+----------+--------------+ RIGHT    CompressibilityPhasicitySpontaneityPropertiesThrombus Aging  +---------+---------------+---------+-----------+----------+--------------+ CFV      Full           Yes      Yes                                 +---------+---------------+---------+-----------+----------+--------------+ SFJ      Full                                                        +---------+---------------+---------+-----------+----------+--------------+ FV Prox  Full                                                        +---------+---------------+---------+-----------+----------+--------------+ FV Mid   Full                                                        +---------+---------------+---------+-----------+----------+--------------+ FV DistalFull                                                        +---------+---------------+---------+-----------+----------+--------------+ PFV      Full                                                        +---------+---------------+---------+-----------+----------+--------------+ POP      Full           Yes      Yes                                 +---------+---------------+---------+-----------+----------+--------------+  PTV      Full                                                        +---------+---------------+---------+-----------+----------+--------------+ PERO     Full                                                        +---------+---------------+---------+-----------+----------+--------------+   +---------+---------------+---------+-----------+----------+-----------------+ LEFT     CompressibilityPhasicitySpontaneityPropertiesThrombus Aging    +---------+---------------+---------+-----------+----------+-----------------+ CFV      Full           Yes      Yes                                    +---------+---------------+---------+-----------+----------+-----------------+ SFJ      Full                                                            +---------+---------------+---------+-----------+----------+-----------------+ FV Prox  Full                                                           +---------+---------------+---------+-----------+----------+-----------------+ FV Mid   Full                                                           +---------+---------------+---------+-----------+----------+-----------------+ FV DistalFull                                                           +---------+---------------+---------+-----------+----------+-----------------+ PFV      Full                                                           +---------+---------------+---------+-----------+----------+-----------------+ POP      Full           No       No                                     +---------+---------------+---------+-----------+----------+-----------------+ PTV      Full  Age Indeterminate +---------+---------------+---------+-----------+----------+-----------------+ PERO     Full                                         Age Indeterminate +---------+---------------+---------+-----------+----------+-----------------+     Summary: Right: There is no evidence of deep vein thrombosis in the lower extremity. Left: Findings consistent with age indeterminate deep vein thrombosis involving the left posterior tibial veins, and left peroneal veins.  *See table(s) above for measurements and observations. Electronically signed by Lemar Livings MD on 04/28/2019 at 10:55:03 AM.    Final    CT Angio Abd/Pel w/ and/or w/o  Result Date: 04/27/2019 CLINICAL DATA:  Acute generalized abdominal pain. EXAM: CTA ABDOMEN AND PELVIS WITHOUT AND WITH CONTRAST TECHNIQUE: Multidetector CT imaging of the abdomen and pelvis was performed using the standard protocol during bolus administration of intravenous contrast. Multiplanar reconstructed images and MIPs were obtained and reviewed to  evaluate the vascular anatomy. CONTRAST:  OMNIPAQUE IOHEXOL 350 MG/ML SOLN COMPARISON:  None. FINDINGS: VASCULAR Aorta: Normal caliber aorta without aneurysm, dissection, vasculitis or significant stenosis. Celiac: Patent without evidence of aneurysm, dissection, vasculitis or significant stenosis. SMA: Patent without evidence of aneurysm, dissection, vasculitis or significant stenosis. Renals: Both renal arteries are patent without evidence of aneurysm, dissection, vasculitis, fibromuscular dysplasia or significant stenosis. IMA: Patent without evidence of aneurysm, dissection, vasculitis or significant stenosis. Inflow: Patent without evidence of aneurysm, dissection, vasculitis or significant stenosis. Proximal Outflow: Bilateral common femoral and visualized portions of the superficial and profunda femoral arteries are patent without evidence of aneurysm, dissection, vasculitis or significant stenosis. Veins: No obvious venous abnormality within the limitations of this arterial phase study. Review of the MIP images confirms the above findings. NON-VASCULAR Lower chest: No acute abnormality. Hepatobiliary: Cholelithiasis is noted. No biliary dilatation is noted. Minimally nodular hepatic margins are noted suggesting possible early hepatic cirrhosis. Pancreas: Unremarkable. No pancreatic ductal dilatation or surrounding inflammatory changes. Spleen: Normal in size without focal abnormality. Adrenals/Urinary Tract: Adrenal glands and kidneys appear normal. No hydronephrosis is noted. Urinary bladder appears to be compressed by large pelvic hematoma. Stomach/Bowel: The stomach appears normal. There is no evidence of bowel obstruction or inflammation. The appendix is not visualized. Lymphatic: No adenopathy is noted. Reproductive: Status post hysterectomy. No adnexal masses. Other: There is a large complex lobulated density in the left in anterior portion of the pelvis which appears to arise from inferior  portion of left rectal sheath, most consistent with hemorrhage. There is noted some hyperdensity along the medial portion of the left inferior rectus sheath which may represent contrast extravasation and possible active hemorrhage. Musculoskeletal: No acute or significant osseous findings. IMPRESSION: VASCULAR Large pelvic hematoma is noted which appears to originate from the inferior portion of the left rectus muscle, most consistent with spontaneous hemorrhage, potentially related to anticoagulation. This causes displacement and compression of the urinary bladder. There is seen some irregular high density along the medial portion of the left inferior rectus muscle which may represent contrast extravasation and possibly active hemorrhage. Critical Value/emergent results were called by telephone at the time of interpretation on 04/27/2019 at 5:05 pm to providerCAROLE Sarena Jezek , who verbally acknowledged these results. Mild cholelithiasis is noted. Minimally nodular hepatic margins are noted suggesting possible hepatic cirrhosis. Electronically Signed   By: Lupita Raider M.D.   On: 04/27/2019 17:14    Scheduled Meds: . sodium chloride   Intravenous  Once  . sodium chloride   Intravenous Once  . sodium chloride   Intravenous Once  . albuterol  2 puff Inhalation Q8H  . buPROPion  100 mg Oral Daily  . Chlorhexidine Gluconate Cloth  6 each Topical Daily  . cholecalciferol  1,000 Units Oral Daily  . dexamethasone (DECADRON) injection  6 mg Intravenous Q24H  . dextromethorphan-guaiFENesin  1 tablet Oral BID  . diclofenac Sodium  4 g Topical QID  . feeding supplement (ENSURE ENLIVE)  237 mL Oral BID BM  . insulin aspart  0-15 Units Subcutaneous TID WC  . insulin aspart  0-5 Units Subcutaneous QHS  . insulin aspart  7 Units Subcutaneous TID WC  . insulin glargine  10 Units Subcutaneous Daily  . ipratropium  2 puff Inhalation Q8H  . mouth rinse  15 mL Mouth Rinse BID  . pantoprazole  40 mg Oral Daily  .  senna-docusate  1 tablet Oral QHS  . vitamin C  500 mg Oral Daily  . zinc sulfate  220 mg Oral Daily    Continuous Infusions: . sodium chloride 75 mL/hr at 04/28/19 0324  . albumin human 50 g (04/28/19 0545)     LOS: 7 days     Darlin Drop, MD [facility] Pager [phone]  If 7PM-7AM, please contact night-coverage [email or web address] Password [email or web address] 04/28/2019, 11:06 AM

## 2019-04-29 ENCOUNTER — Inpatient Hospital Stay (HOSPITAL_COMMUNITY): Payer: PPO

## 2019-04-29 HISTORY — PX: IR IVC FILTER PLMT / S&I /IMG GUID/MOD SED: IMG701

## 2019-04-29 LAB — COMPREHENSIVE METABOLIC PANEL
ALT: 21 U/L (ref 0–44)
AST: 23 U/L (ref 15–41)
Albumin: 4.9 g/dL (ref 3.5–5.0)
Alkaline Phosphatase: 34 U/L — ABNORMAL LOW (ref 38–126)
Anion gap: 11 (ref 5–15)
BUN: 12 mg/dL (ref 8–23)
CO2: 21 mmol/L — ABNORMAL LOW (ref 22–32)
Calcium: 9.5 mg/dL (ref 8.9–10.3)
Chloride: 105 mmol/L (ref 98–111)
Creatinine, Ser: 0.71 mg/dL (ref 0.44–1.00)
GFR calc Af Amer: >60 mL/min (ref >60)
GFR calc non Af Amer: >60 mL/min (ref >60)
Glucose, Bld: 251 mg/dL — ABNORMAL HIGH (ref 70–99)
Potassium: 4.3 mmol/L (ref 3.5–5.1)
Sodium: 137 mmol/L (ref 135–145)
Total Bilirubin: 1.7 mg/dL — ABNORMAL HIGH (ref 0.3–1.2)
Total Protein: 6.4 g/dL — ABNORMAL LOW (ref 6.5–8.1)

## 2019-04-29 LAB — CBC WITH DIFFERENTIAL/PLATELET
Abs Immature Granulocytes: 0.28 10*3/uL — ABNORMAL HIGH (ref 0.00–0.07)
Abs Immature Granulocytes: 0.23 10*3/uL — ABNORMAL HIGH (ref 0.00–0.07)
Basophils Absolute: 0 10*3/uL (ref 0.0–0.1)
Basophils Absolute: 0 10*3/uL (ref 0.0–0.1)
Basophils Relative: 0 %
Basophils Relative: 0 %
Eosinophils Absolute: 0 10*3/uL (ref 0.0–0.5)
Eosinophils Absolute: 0.1 10*3/uL (ref 0.0–0.5)
Eosinophils Relative: 0 %
Eosinophils Relative: 1 %
HCT: 23.8 % — ABNORMAL LOW (ref 36.0–46.0)
HCT: 29.6 % — ABNORMAL LOW (ref 36.0–46.0)
Hemoglobin: 7.5 g/dL — ABNORMAL LOW (ref 12.0–15.0)
Hemoglobin: 9.8 g/dL — ABNORMAL LOW (ref 12.0–15.0)
Immature Granulocytes: 4 %
Immature Granulocytes: 3 %
Lymphocytes Relative: 15 %
Lymphocytes Relative: 28 %
Lymphs Abs: 1 10*3/uL (ref 0.7–4.0)
Lymphs Abs: 2.5 10*3/uL (ref 0.7–4.0)
MCH: 25.9 pg — ABNORMAL LOW (ref 26.0–34.0)
MCH: 26.8 pg (ref 26.0–34.0)
MCHC: 31.5 g/dL (ref 30.0–36.0)
MCHC: 33.1 g/dL (ref 30.0–36.0)
MCV: 82.1 fL (ref 80.0–100.0)
MCV: 81.1 fL (ref 80.0–100.0)
Monocytes Absolute: 0.4 10*3/uL (ref 0.1–1.0)
Monocytes Absolute: 0.8 10*3/uL (ref 0.1–1.0)
Monocytes Relative: 6 %
Monocytes Relative: 9 %
Neutro Abs: 4.9 10*3/uL (ref 1.7–7.7)
Neutro Abs: 5.4 10*3/uL (ref 1.7–7.7)
Neutrophils Relative %: 75 %
Neutrophils Relative %: 59 %
Platelets: 112 10*3/uL — ABNORMAL LOW (ref 150–400)
Platelets: 99 10*3/uL — ABNORMAL LOW (ref 150–400)
RBC: 2.9 MIL/uL — ABNORMAL LOW (ref 3.87–5.11)
RBC: 3.65 MIL/uL — ABNORMAL LOW (ref 3.87–5.11)
RDW: 21.2 % — ABNORMAL HIGH (ref 11.5–15.5)
RDW: 19.6 % — ABNORMAL HIGH (ref 11.5–15.5)
WBC: 6.6 10*3/uL (ref 4.0–10.5)
WBC: 9.1 10*3/uL (ref 4.0–10.5)
nRBC: 0 % (ref 0.0–0.2)
nRBC: 0.2 % (ref 0.0–0.2)

## 2019-04-29 LAB — D-DIMER, QUANTITATIVE: D-Dimer, Quant: 12.56 ug/mL-FEU — ABNORMAL HIGH (ref 0.00–0.50)

## 2019-04-29 LAB — HEMOGLOBIN AND HEMATOCRIT, BLOOD
HCT: 22.3 % — ABNORMAL LOW (ref 36.0–46.0)
HCT: 22.6 % — ABNORMAL LOW (ref 36.0–46.0)
HCT: 25.3 % — ABNORMAL LOW (ref 36.0–46.0)
HCT: 26.7 % — ABNORMAL LOW (ref 36.0–46.0)
Hemoglobin: 7.1 g/dL — ABNORMAL LOW (ref 12.0–15.0)
Hemoglobin: 7.3 g/dL — ABNORMAL LOW (ref 12.0–15.0)
Hemoglobin: 8.1 g/dL — ABNORMAL LOW (ref 12.0–15.0)
Hemoglobin: 8.6 g/dL — ABNORMAL LOW (ref 12.0–15.0)

## 2019-04-29 LAB — GLUCOSE, CAPILLARY
Glucose-Capillary: 211 mg/dL — ABNORMAL HIGH (ref 70–99)
Glucose-Capillary: 176 mg/dL — ABNORMAL HIGH (ref 70–99)
Glucose-Capillary: 138 mg/dL — ABNORMAL HIGH (ref 70–99)
Glucose-Capillary: 138 mg/dL — ABNORMAL HIGH (ref 70–99)

## 2019-04-29 LAB — PREPARE RBC (CROSSMATCH)

## 2019-04-29 LAB — TRANSFUSION REACTION
DAT C3: NEGATIVE
Post RXN DAT IgG: NEGATIVE

## 2019-04-29 LAB — LACTATE DEHYDROGENASE: LDH: 158 U/L (ref 98–192)

## 2019-04-29 LAB — FERRITIN: Ferritin: 43 ng/mL (ref 11–307)

## 2019-04-29 LAB — FIBRINOGEN: Fibrinogen: 242 mg/dL (ref 210–475)

## 2019-04-29 LAB — C-REACTIVE PROTEIN: CRP: 10.2 mg/dL — ABNORMAL HIGH (ref <1.0)

## 2019-04-29 MED ORDER — MIDAZOLAM HCL 2 MG/2ML IJ SOLN
INTRAMUSCULAR | Status: AC
  Filled 2019-04-29: qty 2

## 2019-04-29 MED ORDER — FENTANYL CITRATE (PF) 100 MCG/2ML IJ SOLN
INTRAMUSCULAR | Status: AC
  Filled 2019-04-29: qty 2

## 2019-04-29 MED ORDER — LIDOCAINE HCL 1 % IJ SOLN
INTRAMUSCULAR | Status: AC | PRN
  Administered 2019-04-29: 5 mL

## 2019-04-29 MED ORDER — SODIUM CHLORIDE 0.9% IV SOLUTION
Freq: Once | INTRAVENOUS | Status: AC
  Administered 2019-04-29: 11:00:00 via INTRAVENOUS

## 2019-04-29 MED ORDER — INSULIN ASPART 100 UNIT/ML ~~LOC~~ SOLN
0.0000 [IU] | SUBCUTANEOUS | Status: DC
  Administered 2019-04-29: 16:00:00 3 [IU] via SUBCUTANEOUS

## 2019-04-29 MED ORDER — LIDOCAINE HCL 1 % IJ SOLN
INTRAMUSCULAR | Status: AC
  Filled 2019-04-29: qty 20

## 2019-04-29 MED ORDER — IOHEXOL 300 MG/ML  SOLN
100.0000 mL | Freq: Once | INTRAMUSCULAR | Status: AC | PRN
  Administered 2019-04-29: 50 mL via INTRAVENOUS

## 2019-04-29 MED ORDER — FENTANYL CITRATE (PF) 100 MCG/2ML IJ SOLN
INTRAMUSCULAR | Status: AC | PRN
  Administered 2019-04-29: 50 ug via INTRAVENOUS

## 2019-04-29 MED ORDER — DIPHENHYDRAMINE HCL 50 MG/ML IJ SOLN
50.0000 mg | Freq: Once | INTRAMUSCULAR | Status: AC
  Administered 2019-04-29: 11:00:00 50 mg via INTRAVENOUS
  Filled 2019-04-29: qty 1

## 2019-04-29 MED ORDER — SODIUM CHLORIDE 0.9% IV SOLUTION: Freq: Once | INTRAVENOUS | Status: DC

## 2019-04-29 MED ORDER — MIDAZOLAM HCL 2 MG/2ML IJ SOLN
INTRAMUSCULAR | Status: AC | PRN
  Administered 2019-04-29: 1 mg via INTRAVENOUS

## 2019-04-29 NOTE — Progress Notes (Signed)
RN spoke with Dr. Nevada Crane in regards to NPO status. Per MD, patient is to remain a full NPO, including no sips with meds, due to potential IR intervention.

## 2019-04-29 NOTE — Procedures (Signed)
Pre procedural Dx: DVT/PE with contraindication of anticoagulation Post Procedural Dx: Same  Successful placement of an infrarenal IVC filter via the right internal jugular vein.  EBL: Trace  Complications: None immediate  Ronny Bacon, MD Pager #: [phone]

## 2019-04-29 NOTE — Progress Notes (Signed)
NAME:  Regina Munoz, MRN:  [id number], DOB:  04-02-1951, LOS: 49 ADMISSION DATE:  04/21/2019, CONSULTATION DATE: 04/27/2019 REFERRING MD: Dr. Nevada Crane, CHIEF COMPLAINT: Bilateral pulmonary embolism, rectus muscle hematoma  Brief History   Patient was admitted with difficulty breathing Recent positive Covid 19 11/30 Admitted for COVID-19 pneumonia started on dexamethasone and remdesivir Increased hypoxemia on 12/ 9-patient had a CT angio showing pulmonary embolism Patient was started on heparin This morning started complaining of abdominal pain, had a CT scan of the abdomen revealing wall hematoma ICU consulted  History of present illness   COVID-19 pneumonia Pulmonary embolism 12 /9 L Rectus sheath/pelvic hematoma12/12   Past Medical History   Past Medical History:  Diagnosis Date  . Anxiety   . Arthritis   . Asthma   . Complication of anesthesia   . COVID-19   . Diabetes mellitus without complication ([place])   . Dysrhythmia    palpitations ->20 yrs stress test neg nothing since  . GERD (gastroesophageal reflux disease)   . Headache   . PONV (postoperative nausea and vomiting)      Significant Hospital Events   Pulmonary embolism 12/9 Rectus sheath hematoma 12/12 Transfusion reaction of hives /p 2nd U RBCs 12/13 Consults:  PCCM IR   Procedures:  None  Significant Diagnostic Tests:  1. Massive pulmonary embolus within both main pulmonary arteries extending into all proximal lobar branches with CT evidence of right heart strain (RV/LV Ratio = 1.65) consistent with at least submassive (intermediate risk) PE. The presence of right heart strain has been associated with an increased risk of morbidity and mortality. Please activate Code PE by paging [phone]. 2. Multifocal pulmonary ground-glass opacity without specific evidence of infection 3. Cholelithiasis.  12/12 IMPRESSION: VASCULAR  Large pelvic hematoma is noted which appears to originate from the  inferior portion of the left rectus muscle, most consistent with spontaneous hemorrhage, potentially related to anticoagulation. This causes displacement and compression of the urinary bladder. There is seen some irregular high density along the medial portion of the left inferior rectus muscle which may represent contrast extravasation and possibly active hemorrhage. Critical Value/emergent results were called by telephone at the time of interpretation on 04/27/2019 at 5:05 pm to [place] , who verbally acknowledged these results.  Mild cholelithiasis is noted.  Minimally nodular hepatic margins are noted suggesting possible hepatic cirrhosis.  Echocardiogram 1210 Normal right-sided heart function Micro Data:  SARS coronavirus +11/30  Antimicrobials/COVID 19 Tx:  Remdesivir 12/6>>12/10 Dexamethasone 12/6>>  Interim history/subjective:  Hives /p 2nd U RBCs yesterday.  VSS.  Hgb 8.1>>7.5>>7.3. States ABD pain much better, "only hurts when I cough."   Objective   Blood pressure (!) 118/52, pulse 76, temperature 98.8 F (37.1 C), temperature source Oral, resp. rate (!) 25, height 5\' 3"  (1.6 m), weight 108.2 kg, SpO2 96 %.        Intake/Output Summary (Last 24 hours) at 04/29/2019 1016 Last data filed at 04/29/2019 0800 Gross per 24 hour  Intake 1247.5 ml  Output 500 ml  Net 747.5 ml   Filed Weights   04/22/19 0604 04/25/19 0326 04/27/19 2047  Weight: 107 kg 111.8 kg 108.2 kg    Examination: General: Obese lady, Well-developed, well nourished. NAD HENT: Normocephalic, PERRL. Moist mucus membranes Neck: No JVD. Trachea midline. No thyromegaly, no lymphadenopathy CV: RRR. S1S2. No MRG. +2 distal pulses Lungs: BBS present, diminished at bases.  FNL, symmetrical ABD: Obese. +BS x4. RUQ tenderness to light palpation. ND. No masses, guarding  or rigidity GU: purewick in place  EXT: MAE well. No edema Skin: PWD. In tact. No rashes or lesions Neuro: A&Ox3.  CN II-XII in tact. No focal deficits Psych: Appropriate mood, insight and judgment for time and situation    Resolved Hospital Problem list   Hypotension   Assessment & Plan:   Large pelvic hematoma with acute blood loss anemia S/P 2 U RBCs with hives /p 2nd unit-resolved  -Hgb 8.1>>7.5>>7.3. - Remains hemodynamically stable - Aspirin and AC on hold - follow H&H Q4 for now - consider repeat imaging if worsening counts and/or hemodynamic instability  -IR following    Submassive pulmonary embolism L posterior tibial veins, and left peroneal veins DVTs -Was being anticoagulated with heparin -Complicated by pelvic hematoma -Anticoagulation on hold -need IVC filter. Will D/W IR  -supplemental O2 to maintain SpO2 >/=92%   COVID-19 pneumonia -Completed course of remdesivir -Completed course of dexamethasone - In theory should be outside infectious stage  Best practice:  Diet: NPO for now Pain/Anxiety/Delirium protocol (if indicated): oxycodone moderate, dilaudid severe VAP protocol (if indicated): Not indicated DVT prophylaxis: on hold Glucose control: Monitor Mobility: Bedrest Code Status: Full code Family Communication: Per primary Disposition: ICU    Karin Lieu, MSN, AGACNP  [facility]

## 2019-04-29 NOTE — Progress Notes (Signed)
Nutrition Follow-up  RD working remotely.  DOCUMENTATION CODES:   Morbid obesity  INTERVENTION:    Resume PO diet and supplements (Ensure Enlive po BID) after IR procedure.  NUTRITION DIAGNOSIS:   Inadequate oral intake related to decreased appetite as evidenced by per patient/family report.  Ongoing  GOAL:   Patient will meet greater than or equal to 90% of their needs  Unmet  MONITOR:   PO intake, Supplement acceptance, Labs, Weight trends  REASON FOR ASSESSMENT:   Malnutrition Screening Tool    ASSESSMENT:   68 year old female who presented to the ED on 12/06 with SOB. Pt with known COVID-19 infection. PMH of DM, asthma, anxiety. Pt admitted with COVID-19 pneumonia.   Patient had been consuming 100% of heart healthy / CHO modified meals up until she was made NPO on 12/12 when she was transferred to the ICU. Found to have LLE DVT.  12/12 CT scan of abdomen showed wall hematoma. Needs IVC filter. Remains NPO for now for potential IR procedure.   Medications reviewed and include: cholecalciferol, novolog, lantus, senokot-s, vitamin C, zinc sulfate.  Labs reviewed. Hgb trending down 7.3 --> 7.1 this morning.  CBG's: 211-176  NUTRITION - FOCUSED PHYSICAL EXAM:  Unable to complete at this time. RD working remotely.  Diet Order:   Diet Order            Diet NPO time specified  Diet effective now              EDUCATION NEEDS:   Education needs have been addressed  Skin:  Skin Assessment: Reviewed RN Assessment (MASD right buttocks)  Last BM:  12/12 type 4  Height:   Ht Readings from Last 1 Encounters:  04/21/19 5\' 3"  (1.6 m)    Weight:   Wt Readings from Last 1 Encounters:  04/27/19 108.2 kg    Ideal Body Weight:  52.3 kg  BMI:  Body mass index is 42.26 kg/m.  Estimated Nutritional Needs:   Kcal:  1900-2100  Protein:  95-110 grams  Fluid:  >/= 1.8 L    Molli Barrows, RD, LDN, [place] Pager [phone] After Hours Pager  [phone]

## 2019-04-29 NOTE — H&P (Signed)
Chief Complaint: PE with left rectus sheath hematoma while on heparin  Referring Physician(s): Delma Post NP   Supervising Physician: Simonne Come  Patient Status: [facility] - In-pt  History of Present Illness: Regina Munoz is a 68 y.o. female Admitted due to difficulty breathing (PNA) related to COVID. Patient was found to have pulmonary embolism and started on heparin gtt and developed a left rectus sheath hematoma 3 days later. Team is requesting an IVC filter at this time.    Past Medical History:  Diagnosis Date  . Anxiety   . Arthritis   . Asthma   . Complication of anesthesia   . COVID-19   . Diabetes mellitus without complication (HCC)   . Dysrhythmia    palpitations ->20 yrs stress test neg nothing since  . GERD (gastroesophageal reflux disease)   . Headache   . PONV (postoperative nausea and vomiting)     Past Surgical History:  Procedure Laterality Date  . ABDOMINAL HYSTERECTOMY    . ANTERIOR CERVICAL DECOMP/DISCECTOMY FUSION N/A 02/17/2016   Procedure: ANTERIOR CERVICAL DECOMPRESSION/DISCECTOMY FUSION 1 LEVEL C4-5;  Surgeon: Venita Lick, MD;  Location: [facility] OR;  Service: Orthopedics;  Laterality: N/A;  . EYE SURGERY Bilateral    cataracts  . HIP CLOSED REDUCTION Right 12/24/2012   Procedure: CLOSED REDUCTION HIP;  Surgeon: Emilee Hero, MD;  Location: [facility] ORS;  Service: Orthopedics;  Laterality: Right;  . JOINT REPLACEMENT  2002   right and left knees  . SHOULDER ARTHROSCOPY Left   . TOTAL HIP ARTHROPLASTY Right    2002    Allergies: Patient has no known allergies.  Medications: Prior to Admission medications   Medication Sig Start Date End Date Taking? Authorizing Provider  acetaminophen (TYLENOL) 500 MG tablet Take 1,000 mg by mouth every 6 (six) hours as needed for mild pain or moderate pain.    Yes [provider]  aspirin EC 81 MG tablet Take 81 mg by mouth daily.   Yes [provider]  buPROPion (ZYBAN) 150 MG 12 hr  tablet Take 75 mg by mouth every morning.  03/28/19  Yes [provider]  cetirizine (ZYRTEC) 10 MG tablet Take 10 mg by mouth daily. 02/13/19  Yes [provider]  cholecalciferol (VITAMIN D) 1000 UNITS tablet Take 1,000 Units by mouth daily.   Yes [provider]  diclofenac Sodium (VOLTAREN) 1 % GEL Apply 2 g topically daily as needed (for pain).    Yes [provider]  metFORMIN (GLUCOPHAGE) 500 MG tablet Take 500 mg by mouth 2 (two) times daily with a meal.    Yes [provider]  omeprazole (PRILOSEC) 20 MG capsule Take 20 mg by mouth daily.   Yes [provider]     History reviewed. No pertinent family history.  Social History   Socioeconomic History  . Marital status: Married    Spouse name: Not on file  . Number of children: Not on file  . Years of education: Not on file  . Highest education level: Not on file  Occupational History  . Not on file  Tobacco Use  . Smoking status: Never Smoker  . Smokeless tobacco: Never Used  Substance and Sexual Activity  . Alcohol use: No  . Drug use: No  . Sexual activity: Not on file  Other Topics Concern  . Not on file  Social History Narrative  . Not on file   Social Determinants of Health   Financial Resource Strain: Low Risk   .  Difficulty of Paying Living Expenses: Not hard at all  Food Insecurity: No Food Insecurity  . Worried About Programme researcher, broadcasting/film/video in the Last Year: Never true  . Ran Out of Food in the Last Year: Never true  Transportation Needs: No Transportation Needs  . Lack of Transportation (Medical): No  . Lack of Transportation (Non-Medical): No  Physical Activity: Unknown  . Days of Exercise per Week: 1 day  . Minutes of Exercise per Session: Not on file  Stress:   . Feeling of Stress : Not on file  Social Connections:   . Frequency of Communication with Friends and Family: Not on file  . Frequency of Social Gatherings with Friends and Family: Not on  file  . Attends Religious Services: Not on file  . Active Member of Clubs or Organizations: Not on file  . Attends Banker Meetings: Not on file  . Marital Status: Not on file    Review of Systems: A 12 point ROS discussed and pertinent positives are indicated in the HPI above.  All other systems are negative.  Review of Systems  Constitutional: Negative for fatigue and fever.  HENT: Negative for congestion.   Respiratory: Negative for cough and shortness of breath.   Cardiovascular: Negative for chest pain.    Vital Signs: BP 110/64   Pulse 68   Temp 98.2 F (36.8 C) (Oral)   Resp (!) 26   Ht 5\' 3"  (1.6 m)   Wt 238 lb 8.6 oz (108.2 kg)   SpO2 98%   BMI 42.26 kg/m   Physical Exam Cardiovascular:     Rate and Rhythm: Normal rate and regular rhythm.  Pulmonary:     Effort: Pulmonary effort is normal. Tachypnea present.     Breath sounds: Examination of the right-lower field reveals decreased breath sounds. Examination of the left-lower field reveals decreased breath sounds. Decreased breath sounds present.  Neurological:     Mental Status: She is alert and oriented to person, place, and time.  Psychiatric:        Mood and Affect: Mood normal.        Behavior: Behavior normal.     Imaging: CT ABDOMEN PELVIS WO CONTRAST  Result Date: 04/28/2019 CLINICAL DATA:  Follow-up of retroperitoneal hematoma. Pulmonary emboli. EXAM: CT ABDOMEN AND PELVIS WITHOUT CONTRAST TECHNIQUE: Multidetector CT imaging of the abdomen and pelvis was performed following the standard protocol without IV contrast. COMPARISON:  04/27/2019 FINDINGS: Lower chest: There is a hazy area of density at the right lung base posteriorly which has progressed, consistent with a pulmonary infarct. There is slight increased atelectasis at the left lung base posteriorly. Heart size is normal. Hepatobiliary: Prominent left lobe of the liver with a nodular contour of the liver suggesting cirrhosis. Multiple  gallstones. No dilated bile ducts. Pancreas: Unremarkable. No pancreatic ductal dilatation or surrounding inflammatory changes. Spleen: Normal in size without focal abnormality. Adrenals/Urinary Tract: The adrenal glands and kidneys are normal. No hydronephrosis. The bladder is markedly compressed by the large pelvic hematoma. Stomach/Bowel: Stomach is within normal limits. Appendix is not visualized. No evidence of bowel wall thickening, distention, or inflammatory changes. The pelvic hematoma has a mass effect upon the adjacent bowel. Vascular/Lymphatic: No significant vascular findings are present. No enlarged abdominal or pelvic lymph nodes. Reproductive: Status post hysterectomy. No adnexal masses. Other: The large pelvic hematoma is essentially unchanged since the prior study. There is minimally more prominent hemorrhage in the mesentery just above the pelvic hematoma. The  hematoma appears to arise from the inferior aspect of the left rectus muscle. Musculoskeletal: No acute or significant osseous findings. IMPRESSION: 1. No significant change in the large pelvic hematoma. The hematoma appears to arise from the inferior aspect of the left rectus muscle. 2. Slightly more prominent but slight a hemorrhage in the mesentery just above the pelvic hematoma. 3. 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 4. Probable cirrhosis. 5. Cholelithiasis. Electronically Signed   By: Francene Boyers M.D.   On: 04/28/2019 14:44   CT ANGIO CHEST PE W OR WO CONTRAST  Result Date: 04/24/2019 CLINICAL DATA:  Hypoxemia, cough and shortness of breath. COVID-19 pneumonia. EXAM: CT ANGIOGRAPHY CHEST WITH CONTRAST TECHNIQUE: Multidetector CT imaging of the chest was performed using the standard protocol during bolus administration of intravenous contrast. Multiplanar CT image reconstructions and MIPs were obtained to evaluate the vascular anatomy. CONTRAST:  75mL OMNIPAQUE IOHEXOL 350 MG/ML SOLN COMPARISON:  None. FINDINGS:  Cardiovascular: --Pulmonary arteries: Contrast injection is sufficient to demonstrate satisfactory opacification of the pulmonary arteries to the segmental level, with attenuation of at least 200 HU at the main pulmonary artery. There is a large pulmonary embolus clot burden within both the right and left main pulmonary arteries extending into all proximal lobar branches. There is evidence of right heart strain with an RV to LV ratio of 1.65. the main pulmonary artery is within normal limits for size. --Aorta: Satisfactory opacification of the thoracic aorta. No aortic dissection or other acute aortic syndrome. Conventional 3 vessel aortic branching pattern. There is no aortic atherosclerosis. --Heart: Normal size. No pericardial effusion. Mediastinum/Nodes: No mediastinal, hilar or axillary lymphadenopathy. The visualized thyroid and thoracic esophageal course are unremarkable. Lungs/Pleura: Mild ground-glass opacity without focal consolidation. Upper Abdomen: Contrast bolus timing is not optimized for evaluation of the abdominal organs. There are stones within the gallbladder. Musculoskeletal: No chest wall abnormality. No bony spinal canal stenosis. Review of the MIP images confirms the above findings. IMPRESSION: 1. Massive pulmonary embolus within both main pulmonary arteries extending into all proximal lobar branches with CT evidence of right heart strain (RV/LV Ratio = 1.65) consistent with at least submassive (intermediate risk) PE. The presence of right heart strain has been associated with an increased risk of morbidity and mortality. Please activate Code PE by paging [phone]. 2. Multifocal pulmonary ground-glass opacity without specific evidence of infection 3. Cholelithiasis. Critical Value/emergent results were called by telephone at the time of interpretation on 04/24/2019 at 9:38 pm to provider K. Craige Cotta, who verbally acknowledged these results. Electronically Signed   By: Deatra Robinson M.D.   On:  04/24/2019 21:38   DG CHEST PORT 1 VIEW  Result Date: 04/24/2019 CLINICAL DATA:  68 year old with COVID-19 pneumonia, now with worsening hypoxia. EXAM: PORTABLE CHEST 1 VIEW COMPARISON:  04/21/2019. FINDINGS: Cardiac silhouette upper normal in size to mildly enlarged, unchanged. Improved aeration in the lung bases, with residual mild streaky opacities at the LEFT base. No new pulmonary parenchymal abnormalities. Normal pulmonary vascularity. No visible pleural effusions. IMPRESSION: 1. Improved aeration in the lung bases, with residual mild streaky atelectasis and/or bronchopneumonia at the LEFT base. 2. No new abnormalities. Electronically Signed   By: Hulan Saas M.D.   On: 04/24/2019 17:07   DG Chest Port 1 View  Result Date: 04/21/2019 CLINICAL DATA:  68 year old female with shortness of breath. EXAM: PORTABLE CHEST 1 VIEW COMPARISON:  Chest radiograph report dated 02/28/2001. The images are not available for direct comparison. FINDINGS: Minimal bibasilar, left greater than  right, densities likely atelectatic changes. Atypical infiltrate is less likely but not excluded. Clinical correlation is recommended. There is no focal consolidation, pleural effusion, pneumothorax. The cardiac silhouette is within normal limits. No acute osseous pathology. IMPRESSION: Minimal bibasilar atelectasis.  No focal consolidation. Electronically Signed   By: Elgie Collard M.D.   On: 04/21/2019 17:45   VAS [place] LOWER EXTREMITY VENOUS (DVT)  Result Date: 04/28/2019  Lower Venous Study Indications: Pulmonary embolism. Other Indications: Covid positive. Risk Factors: Large pelvic hematoma is noted which appears to originate from the. Limitations: Body habitus. Comparison Study: No prior study on file for comparison. Performing Technologist: Sherren Kerns RVS  Examination Guidelines: A complete evaluation includes B-mode imaging, spectral Doppler, color Doppler, and power Doppler as needed of all accessible portions  of each vessel. Bilateral testing is considered an integral part of a complete examination. Limited examinations for reoccurring indications may be performed as noted.  +---------+---------------+---------+-----------+----------+--------------+ RIGHT    CompressibilityPhasicitySpontaneityPropertiesThrombus Aging +---------+---------------+---------+-----------+----------+--------------+ CFV      Full           Yes      Yes                                 +---------+---------------+---------+-----------+----------+--------------+ SFJ      Full                                                        +---------+---------------+---------+-----------+----------+--------------+ FV Prox  Full                                                        +---------+---------------+---------+-----------+----------+--------------+ FV Mid   Full                                                        +---------+---------------+---------+-----------+----------+--------------+ FV DistalFull                                                        +---------+---------------+---------+-----------+----------+--------------+ PFV      Full                                                        +---------+---------------+---------+-----------+----------+--------------+ POP      Full           Yes      Yes                                 +---------+---------------+---------+-----------+----------+--------------+ PTV      Full                                                        +---------+---------------+---------+-----------+----------+--------------+  PERO     Full                                                        +---------+---------------+---------+-----------+----------+--------------+   +---------+---------------+---------+-----------+----------+-----------------+ LEFT     CompressibilityPhasicitySpontaneityPropertiesThrombus Aging     +---------+---------------+---------+-----------+----------+-----------------+ CFV      Full           Yes      Yes                                    +---------+---------------+---------+-----------+----------+-----------------+ SFJ      Full                                                           +---------+---------------+---------+-----------+----------+-----------------+ FV Prox  Full                                                           +---------+---------------+---------+-----------+----------+-----------------+ FV Mid   Full                                                           +---------+---------------+---------+-----------+----------+-----------------+ FV DistalFull                                                           +---------+---------------+---------+-----------+----------+-----------------+ PFV      Full                                                           +---------+---------------+---------+-----------+----------+-----------------+ POP      Full           No       No                                     +---------+---------------+---------+-----------+----------+-----------------+ PTV      Full                                         Age Indeterminate +---------+---------------+---------+-----------+----------+-----------------+ PERO     Full  Age Indeterminate +---------+---------------+---------+-----------+----------+-----------------+     Summary: Right: There is no evidence of deep vein thrombosis in the lower extremity. Left: Findings consistent with age indeterminate deep vein thrombosis involving the left posterior tibial veins, and left peroneal veins.  *See table(s) above for measurements and observations. Electronically signed by Lemar LivingsBrandon Cain MD on 04/28/2019 at 10:55:03 AM.    Final    ECHOCARDIOGRAM LIMITED  Result Date: 04/25/2019   ECHOCARDIOGRAM LIMITED  REPORT   Patient Name:   Regina SarkRICIA T Sobotta Date of Exam: 04/25/2019 Medical Rec #:  [id number]         Height:       63.0 in Accession #:    [id number]        Weight:       246.5 lb Date of Birth:  09-12-1950         BSA:          2.11 m Patient Age:    68 years          BP:           126/70 mmHg Patient Gender: F                 HR:           79 bpm. Exam Location:  Inpatient  Procedure: Limited Echo, Limited Color Doppler and Cardiac Doppler STAT ECHO Indications:    pulmonary embolus 415.19  History:        Patient has no prior history of Echocardiogram examinations.  Sonographer:    Delcie RochLauren Pennington Referring Phys: [id number] CAROLE N HALL IMPRESSIONS  1. Left ventricular ejection fraction, by visual estimation, is 50 to 55%. The left ventricle has normal function. There is no left ventricular hypertrophy.  2. Left ventricular diastolic parameters are consistent with Grade I diastolic dysfunction (impaired relaxation).  3. The left ventricle has no regional wall motion abnormalities.  4. Global right ventricle has normal systolic function.The right ventricular size is normal.  5. Left atrial size was normal.  6. Right atrial size was normal.  7. The mitral valve is normal in structure. No evidence of mitral valve regurgitation. No evidence of mitral stenosis.  8. The tricuspid valve is normal in structure. Tricuspid valve regurgitation is trivial.  9. The aortic valve is tricuspid. Aortic valve regurgitation is not visualized. Mild aortic valve sclerosis without stenosis. 10. The pulmonic valve was normal in structure. Pulmonic valve regurgitation is not visualized. 11. Mildly elevated pulmonary artery systolic pressure. 12. The inferior vena cava is normal in size with greater than 50% respiratory variability, suggesting right atrial pressure of 3 mmHg. 13. Low normal LV systolic function; grade 1 diastolic dysfunction; moderate RV dysfunction. FINDINGS  Left Ventricle: Left ventricular ejection fraction, by  visual estimation, is 50 to 55%. The left ventricle has normal function. The left ventricle has no regional wall motion abnormalities. There is no left ventricular hypertrophy. Left ventricular diastolic parameters are consistent with Grade I diastolic dysfunction (impaired relaxation). Normal left atrial pressure. Right Ventricle: The right ventricular size is normal.Global RV systolic function is has normal systolic function. The tricuspid regurgitant velocity is 2.82 m/s, and with an assumed right atrial pressure of 3 mmHg, the estimated right ventricular systolic pressure is mildly elevated at 34.8 mmHg. Left Atrium: Left atrial size was normal in size. Right Atrium: Right atrial size was normal in size Pericardium: There is no evidence of pericardial effusion. Mitral Valve: The mitral valve is normal in structure. No evidence of  mitral valve stenosis by observation. MV Area by PHT, 4.31 cm. MV PHT, 51.04 msec. No evidence of mitral valve regurgitation. Tricuspid Valve: The tricuspid valve is normal in structure. Tricuspid valve regurgitation is trivial. Aortic Valve: The aortic valve is tricuspid. Aortic valve regurgitation is not visualized. Mild aortic valve sclerosis is present, with no evidence of aortic valve stenosis. Pulmonic Valve: The pulmonic valve was normal in structure. Pulmonic valve regurgitation is not visualized. Aorta: The aortic root is normal in size and structure. Venous: The inferior vena cava is normal in size with greater than 50% respiratory variability, suggesting right atrial pressure of 3 mmHg.  LEFT VENTRICLE          Normals PLAX 2D LVIDd:         4.10 cm  3.6 cm   Diastology                 Normals LVIDs:         3.20 cm  1.7 cm   LV e' lateral:   8.27 cm/s 6.42 cm/s LV PW:         0.90 cm  1.4 cm   LV E/e' lateral: 8.4       15.4 LV IVS:        1.00 cm  1.3 cm   LV e' medial:    6.85 cm/s 6.96 cm/s LVOT diam:     1.90 cm  2.0 cm   LV E/e' medial:  10.2      6.96 LV SV:          33 ml    79 ml LV SV Index:   14.51    45 ml/m2 LVOT Area:     2.84 cm 3.14 cm2  RIGHT VENTRICLE RV S prime:     15.60 cm/s TAPSE (M-mode): 2.1 cm LEFT ATRIUM         Index LA diam:    3.20 cm 1.51 cm/m  AORTIC VALVE             Normals LVOT Vmax:   78.30 cm/s LVOT Vmean:  46.200 cm/s 75 cm/s LVOT VTI:    0.134 m     25.3 cm  AORTA                 Normals Ao Root diam: 3.10 cm 31 mm MITRAL VALVE              Normals   TRICUSPID VALVE             Normals MV Area (PHT): 4.31 cm             TR Peak grad:   31.8 mmHg MV PHT:        51.04 msec 55 ms     TR Vmax:        282.00 cm/s 288 cm/s MV Decel Time: 176 msec   187 ms MV E velocity: 69.80 cm/s 103 cm/s  SHUNTS MV A velocity: 78.80 cm/s 70.3 cm/s Systemic VTI:  0.13 m MV E/A ratio:  0.89       1.5       Systemic Diam: 1.90 cm  Olga Millers MD Electronically signed by Olga Millers MD Signature Date/Time: 04/25/2019/3:06:36 PM    Final    CT Angio Abd/Pel w/ and/or w/o  Result Date: 04/27/2019 CLINICAL DATA:  Acute generalized abdominal pain. EXAM: CTA ABDOMEN AND PELVIS WITHOUT AND WITH CONTRAST TECHNIQUE: Multidetector CT imaging of the abdomen and pelvis was performed  using the standard protocol during bolus administration of intravenous contrast. Multiplanar reconstructed images and MIPs were obtained and reviewed to evaluate the vascular anatomy. CONTRAST:  OMNIPAQUE IOHEXOL 350 MG/ML SOLN COMPARISON:  None. FINDINGS: VASCULAR Aorta: Normal caliber aorta without aneurysm, dissection, vasculitis or significant stenosis. Celiac: Patent without evidence of aneurysm, dissection, vasculitis or significant stenosis. SMA: Patent without evidence of aneurysm, dissection, vasculitis or significant stenosis. Renals: Both renal arteries are patent without evidence of aneurysm, dissection, vasculitis, fibromuscular dysplasia or significant stenosis. IMA: Patent without evidence of aneurysm, dissection, vasculitis or significant stenosis. Inflow: Patent  without evidence of aneurysm, dissection, vasculitis or significant stenosis. Proximal Outflow: Bilateral common femoral and visualized portions of the superficial and profunda femoral arteries are patent without evidence of aneurysm, dissection, vasculitis or significant stenosis. Veins: No obvious venous abnormality within the limitations of this arterial phase study. Review of the MIP images confirms the above findings. NON-VASCULAR Lower chest: No acute abnormality. Hepatobiliary: Cholelithiasis is noted. No biliary dilatation is noted. Minimally nodular hepatic margins are noted suggesting possible early hepatic cirrhosis. Pancreas: Unremarkable. No pancreatic ductal dilatation or surrounding inflammatory changes. Spleen: Normal in size without focal abnormality. Adrenals/Urinary Tract: Adrenal glands and kidneys appear normal. No hydronephrosis is noted. Urinary bladder appears to be compressed by large pelvic hematoma. Stomach/Bowel: The stomach appears normal. There is no evidence of bowel obstruction or inflammation. The appendix is not visualized. Lymphatic: No adenopathy is noted. Reproductive: Status post hysterectomy. No adnexal masses. Other: There is a large complex lobulated density in the left in anterior portion of the pelvis which appears to arise from inferior portion of left rectal sheath, most consistent with hemorrhage. There is noted some hyperdensity along the medial portion of the left inferior rectus sheath which may represent contrast extravasation and possible active hemorrhage. Musculoskeletal: No acute or significant osseous findings. IMPRESSION: VASCULAR Large pelvic hematoma is noted which appears to originate from the inferior portion of the left rectus muscle, most consistent with spontaneous hemorrhage, potentially related to anticoagulation. This causes displacement and compression of the urinary bladder. There is seen some irregular high density along the medial portion of the  left inferior rectus muscle which may represent contrast extravasation and possibly active hemorrhage. Critical Value/emergent results were called by telephone at the time of interpretation on 04/27/2019 at 5:05 pm to providerCAROLE HALL , who verbally acknowledged these results. Mild cholelithiasis is noted. Minimally nodular hepatic margins are noted suggesting possible hepatic cirrhosis. Electronically Signed   By: Lupita Raider M.D.   On: 04/27/2019 17:14    Labs:  CBC: Recent Labs    04/27/19 0419 04/27/19 1548 04/28/19 4098 04/28/19 2354 04/29/19 0339 04/29/19 0704 04/29/19 1045  WBC 8.2 17.5* 12.3*  --  6.6  --   --   HGB 11.4* 9.8* 6.7* 8.1* 7.5* 7.3* 7.1*  HCT 36.7 32.2* 22.0* 25.3* 23.8* 22.6* 22.3*  PLT 154 218 149*  --  112*  --   --     COAGS: Recent Labs    04/27/19 2018  INR 1.4*    BMP: Recent Labs    04/27/19 0419 04/27/19 1548 04/28/19 0445 04/29/19 0339  NA 135 138 140 137  K 5.0 4.4 4.6 4.3  CL 102 105 108 105  CO2 25 21* 20* 21*  GLUCOSE 313* 210* 181* 251*  BUN CALCIUM 8.6* 8.3* 8.8* 9.5  CREATININE 0.87 1.07* 1.01* 0.71  GFRNONAA >60 54* 58* >60  GFRAA >60 >60 >60 >  60    LIVER FUNCTION TESTS: Recent Labs    04/26/19 0500 04/27/19 0419 04/28/19 0445 04/29/19 0339  BILITOT 0.6 0.9 1.5* 1.7*  AST 35 23 37 23  ALT 19 17 22 21   ALKPHOS 86 79 47 34*  PROT 6.7 6.3* 6.5 6.4*  ALBUMIN 2.6* 2.6* 4.2 4.9    TUMOR MARKERS: No results for input(s): AFPTM, CEA, CA199, CHROMGRNA in the last 8760 hours.  Assessment and Plan: 68 y.o. female Admitted due to difficulty breathing (PNA) related to COVID. Patient was found to have bilateal  pulmonary embolism and started on heparin gtt . Three days later the patient developed  a left rectus sheath hematoma. Team is requesting an IVC filter at this time.    Hgb 7.3  Pertinent Imaging 12.13.20 - CT abd pelvis reads No significant change in the large pelvic hematoma. The hematoma  appears to arise from the inferior aspect of the left rectus muscle 12.9.20 CT Angio Chest shows RIJ is accessible. Reads Massive pulmonary embolus within both main pulmonary arteries extending into all proximal lobar branches with CT evidence of right heart strain (RV/LV Ratio = 1.65) consistent with at least submassive (intermediate risk) PE    Risks and benefits discussed with the patient including, but not limited to bleeding, infection, vascular injury, pneumothorax which may require chest tube placement, air embolism or even death  All of the patient's questions were answered, patient is agreeable to proceed. Consent signed and in chart.    Thank you for this interesting consult.  I greatly enjoyed meeting Regina Munoz and look forward to participating in their care.  A copy of this report was sent to the requesting provider on this date.  Electronically Signed: Janean Sark, NP 04/29/2019, 2:39 PM   I spent a total of 40 Minutes    in face to face in clinical consultation, greater than 50% of which was counseling/coordinating care for IVC filter placement

## 2019-04-29 NOTE — Progress Notes (Signed)
Physical Therapy Treatment Patient Details Name: Regina Munoz MRN: [id number] DOB: 05/21/1950 Today's Date: 04/29/2019    History of Present Illness 68 y.o. female with medical history significant for diabetes mellitus, asthma and anxiety.  Patient presented to the ED with complaints of difficulty breathing, cough, fever chills nausea vomiting loss of taste and diarrhea. Patient tested positive for COVID-19 11/30. Admitted 04/21/19 for treatment of COVID PNA. Pt developed bil PE and placed on anticoagulants. Developed rectus sheath/pelvic hematoma on 12/12.     PT Comments    Pt requiring significantly more assist today after pelvic hematoma and dropping Hgb over the weekend. Expect she will bounce back quickly with mobility and be able to return home with family. If she doesn't progress as expected may have to look at other post acute options.    Follow Up Recommendations  Supervision for mobility/OOB;Home health PT     Equipment Recommendations  None recommended by PT    Recommendations for Other Services       Precautions / Restrictions Precautions Precautions: None Restrictions Weight Bearing Restrictions: No    Mobility  Bed Mobility Overal bed mobility: Needs Assistance Bed Mobility: Supine to Sit;Sit to Supine     Supine to sit: Mod assist;HOB elevated Sit to supine: Mod assist   General bed mobility comments: Assist to bring legs off of bed and elevate trunk into sitting. Assist to lower trunk and bring legs back up into bed.   Transfers Overall transfer level: Needs assistance Equipment used: 1 person hand held assist Transfers: Sit to/from Stand Sit to Stand: Mod assist         General transfer comment: Assist to bring hips up.  Ambulation/Gait             General Gait Details: Unable to attempt. Pt receiving blood and IV began leaking blood.    Stairs             Wheelchair Mobility    Modified Rankin (Stroke Patients Only)        Balance Overall balance assessment: Needs assistance Sitting-balance support: Feet supported;No upper extremity supported Sitting balance-Leahy Scale: Fair     Standing balance support: During functional activity;Single extremity supported Standing balance-Leahy Scale: Poor Standing balance comment: UE support and min assist for static standing                            Cognition Arousal/Alertness: Awake/alert Behavior During Therapy: WFL for tasks assessed/performed Overall Cognitive Status: Within Functional Limits for tasks assessed                                        Exercises      General Comments        Pertinent Vitals/Pain Pain Assessment: Faces Faces Pain Scale: Hurts even more Pain Location: abdomen with mobility Pain Descriptors / Indicators: Guarding;Grimacing Pain Intervention(s): Limited activity within patient's tolerance;Monitored during session;Repositioned    Home Living                      Prior Function            PT Goals (current goals can now be found in the care plan section) Acute Rehab PT Goals Patient Stated Goal: get her strength back  Progress towards PT goals: Not progressing toward goals - comment  Frequency    Min 3X/week      PT Plan Current plan remains appropriate    Co-evaluation              AM-PAC PT "6 Clicks" Mobility   Outcome Measure  Help needed turning from your back to your side while in a flat bed without using bedrails?: A Lot Help needed moving from lying on your back to sitting on the side of a flat bed without using bedrails?: A Lot Help needed moving to and from a bed to a chair (including a wheelchair)?: A Lot Help needed standing up from a chair using your arms (e.g., wheelchair or bedside chair)?: A Lot Help needed to walk in hospital room?: A Lot Help needed climbing 3-5 steps with a railing? : Total 6 Click Score: 11    End of Session  Equipment Utilized During Treatment: Oxygen Activity Tolerance: Other (comment)(limited by IV malfunction) Patient left: with call bell/phone within reach;in bed;with bed alarm set Nurse Communication: Mobility status;Other (comment)(IV leaking) PT Visit Diagnosis: Unsteadiness on feet (R26.81);Other abnormalities of gait and mobility (R26.89);Muscle weakness (generalized) (M62.81)     Time: 4235-3614 PT Time Calculation (min) (ACUTE ONLY): 19 min  Charges:  $Therapeutic Activity: 8-22 mins                     [facility] PT Acute Rehabilitation Services Pager [phone] Office [phone]    Angelina Ok [facility] 04/29/2019, 2:49 PM

## 2019-04-30 DIAGNOSIS — R0902 Hypoxemia: Secondary | ICD-10-CM

## 2019-04-30 DIAGNOSIS — I2699 Other pulmonary embolism without acute cor pulmonale: Secondary | ICD-10-CM

## 2019-04-30 DIAGNOSIS — D62 Acute posthemorrhagic anemia: Secondary | ICD-10-CM

## 2019-04-30 LAB — COMPREHENSIVE METABOLIC PANEL
ALT: 19 U/L (ref 0–44)
AST: 24 U/L (ref 15–41)
Albumin: 4.8 g/dL (ref 3.5–5.0)
Alkaline Phosphatase: 42 U/L (ref 38–126)
Anion gap: 10 (ref 5–15)
BUN: 18 mg/dL (ref 8–23)
CO2: 21 mmol/L — ABNORMAL LOW (ref 22–32)
Calcium: 9.5 mg/dL (ref 8.9–10.3)
Chloride: 107 mmol/L (ref 98–111)
Creatinine, Ser: 0.81 mg/dL (ref 0.44–1.00)
GFR calc Af Amer: >60 mL/min (ref >60)
GFR calc non Af Amer: >60 mL/min (ref >60)
Glucose, Bld: 188 mg/dL — ABNORMAL HIGH (ref 70–99)
Potassium: 4.1 mmol/L (ref 3.5–5.1)
Sodium: 138 mmol/L (ref 135–145)
Total Bilirubin: 2.5 mg/dL — ABNORMAL HIGH (ref 0.3–1.2)
Total Protein: 6.4 g/dL — ABNORMAL LOW (ref 6.5–8.1)

## 2019-04-30 LAB — CBC WITH DIFFERENTIAL/PLATELET
Abs Immature Granulocytes: 0.24 10*3/uL — ABNORMAL HIGH (ref 0.00–0.07)
Basophils Absolute: 0 10*3/uL (ref 0.0–0.1)
Basophils Relative: 0 %
Eosinophils Absolute: 0.1 10*3/uL (ref 0.0–0.5)
Eosinophils Relative: 1 %
HCT: 29.5 % — ABNORMAL LOW (ref 36.0–46.0)
Hemoglobin: 9.8 g/dL — ABNORMAL LOW (ref 12.0–15.0)
Immature Granulocytes: 3 %
Lymphocytes Relative: 28 %
Lymphs Abs: 2.6 10*3/uL (ref 0.7–4.0)
MCH: 27.3 pg (ref 26.0–34.0)
MCHC: 33.2 g/dL (ref 30.0–36.0)
MCV: 82.2 fL (ref 80.0–100.0)
Monocytes Absolute: 0.7 10*3/uL (ref 0.1–1.0)
Monocytes Relative: 8 %
Neutro Abs: 5.6 10*3/uL (ref 1.7–7.7)
Neutrophils Relative %: 60 %
Platelets: 97 10*3/uL — ABNORMAL LOW (ref 150–400)
RBC: 3.59 MIL/uL — ABNORMAL LOW (ref 3.87–5.11)
RDW: 19.9 % — ABNORMAL HIGH (ref 11.5–15.5)
WBC: 9.2 10*3/uL (ref 4.0–10.5)
nRBC: 0.2 % (ref 0.0–0.2)

## 2019-04-30 LAB — FERRITIN: Ferritin: 51 ng/mL (ref 11–307)

## 2019-04-30 LAB — TYPE AND SCREEN
ABO/RH(D): O POS
Antibody Screen: NEGATIVE
Unit division: 0
Unit division: 0
Unit division: 0
Unit division: 0

## 2019-04-30 LAB — BPAM RBC
Blood Product Expiration Date: 202101102359
Blood Product Expiration Date: 202101102359
Blood Product Expiration Date: 202101112359
Blood Product Expiration Date: 202101112359
ISSUE DATE / TIME: 202012131024
ISSUE DATE / TIME: 202012131237
ISSUE DATE / TIME: 202012141054
ISSUE DATE / TIME: 202012141750
Unit Type and Rh: 5100
Unit Type and Rh: 5100
Unit Type and Rh: 5100
Unit Type and Rh: 5100

## 2019-04-30 LAB — GLUCOSE, CAPILLARY
Glucose-Capillary: 144 mg/dL — ABNORMAL HIGH (ref 70–99)
Glucose-Capillary: 167 mg/dL — ABNORMAL HIGH (ref 70–99)
Glucose-Capillary: 141 mg/dL — ABNORMAL HIGH (ref 70–99)
Glucose-Capillary: 182 mg/dL — ABNORMAL HIGH (ref 70–99)

## 2019-04-30 LAB — D-DIMER, QUANTITATIVE: D-Dimer, Quant: 14.07 ug/mL-FEU — ABNORMAL HIGH (ref 0.00–0.50)

## 2019-04-30 LAB — FIBRINOGEN: Fibrinogen: 260 mg/dL (ref 210–475)

## 2019-04-30 LAB — C-REACTIVE PROTEIN: CRP: 11.2 mg/dL — ABNORMAL HIGH (ref <1.0)

## 2019-04-30 LAB — LACTATE DEHYDROGENASE: LDH: 215 U/L — ABNORMAL HIGH (ref 98–192)

## 2019-04-30 NOTE — Progress Notes (Addendum)
Physical Therapy Treatment Patient Details Name: Regina Munoz MRN: [id number] DOB: 12-May-1951 Today's Date: 04/30/2019    History of Present Illness 68 y.o. female with medical history significant for diabetes mellitus, asthma and anxiety.  Patient presented to the ED with complaints of difficulty breathing, cough, fever chills nausea vomiting loss of taste and diarrhea. Patient tested positive for COVID-19 11/30. Admitted 04/21/19 for treatment of COVID PNA. Pt developed bil PE and placed on anticoagulants. Developed rectus sheath/pelvic hematoma on 12/12. IVC filter placed in radiology on 12/14.    PT Comments    Pt again progressing with mobility as pain from pelvic hematoma is starting to improve. Feel she will be able to return home with assistance of family.   Follow Up Recommendations  Supervision for mobility/OOB;Home health PT     Equipment Recommendations  None recommended by PT    Recommendations for Other Services       Precautions / Restrictions Precautions Precautions: None Restrictions Weight Bearing Restrictions: No    Mobility  Bed Mobility Overal bed mobility: Needs Assistance Bed Mobility: Sidelying to Sit   Sidelying to sit: Min guard;HOB elevated       General bed mobility comments: Incr time and use of rail due to abdominal pain  Transfers Overall transfer level: Needs assistance Equipment used: Rolling walker (2 wheeled) Transfers: Sit to/from Stand Sit to Stand: Min assist         General transfer comment: Assist to bring hips up and for balance.  Ambulation/Gait Ambulation/Gait assistance: Min assist;Mod assist Gait Distance (Feet): 30 Feet(30' x 1, 25' x 1, 20' x 1) Assistive device: Rolling walker (2 wheeled) Gait Pattern/deviations: Step-through pattern;Decreased step length - right;Decreased step length - left;Trunk flexed;Wide base of support;Staggering left Gait velocity: decr Gait velocity interpretation: <1.31 ft/sec,  indicative of household ambulator General Gait Details: Assist for balance with one significant loss of balance requiring mod assist to correct when trying to manuever in tight space and pt hurrying. Verbal cues to keep feet closer to walker.    Stairs             Wheelchair Mobility    Modified Rankin (Stroke Patients Only)       Balance Overall balance assessment: Needs assistance Sitting-balance support: Feet supported;No upper extremity supported Sitting balance-Leahy Scale: Fair     Standing balance support: During functional activity;Single extremity supported Standing balance-Leahy Scale: Poor Standing balance comment: UE support and supervision for static standing                            Cognition Arousal/Alertness: Awake/alert Behavior During Therapy: WFL for tasks assessed/performed Overall Cognitive Status: Within Functional Limits for tasks assessed                                        Exercises      General Comments General comments (skin integrity, edema, etc.): At rest on RA SpO2 >92%. With activity pt with SpO2 84%. Able to recover to 90% with rest and pursed lip breathing. With O2 placed at 1L after amb returned to 98%.       Pertinent Vitals/Pain Pain Assessment: Faces Faces Pain Scale: Hurts little more Pain Location: abdomen with mobility Pain Descriptors / Indicators: Guarding;Grimacing;Sore Pain Intervention(s): Limited activity within patient's tolerance;Monitored during session    Home Living  Prior Function            PT Goals (current goals can now be found in the care plan section) Acute Rehab PT Goals Patient Stated Goal: get her strength back  Progress towards PT goals: Progressing toward goals    Frequency    Min 3X/week      PT Plan Current plan remains appropriate    Co-evaluation PT/OT/SLP Co-Evaluation/Treatment: Yes Reason for Co-Treatment: For  patient/therapist safety PT goals addressed during session: Mobility/safety with mobility        AM-PAC PT "6 Clicks" Mobility   Outcome Measure  Help needed turning from your back to your side while in a flat bed without using bedrails?: A Little Help needed moving from lying on your back to sitting on the side of a flat bed without using bedrails?: A Little Help needed moving to and from a bed to a chair (including a wheelchair)?: A Little Help needed standing up from a chair using your arms (e.g., wheelchair or bedside chair)?: A Little Help needed to walk in hospital room?: A Little Help needed climbing 3-5 steps with a railing? : Total 6 Click Score: 16    End of Session Equipment Utilized During Treatment: Oxygen Activity Tolerance: Patient tolerated treatment well Patient left: with call bell/phone within reach;in chair Nurse Communication: Mobility status(SpO2 levels) PT Visit Diagnosis: Unsteadiness on feet (R26.81);Other abnormalities of gait and mobility (R26.89);Muscle weakness (generalized) (M62.81)     Time: 2774-1287 PT Time Calculation (min) (ACUTE ONLY): 40 min  Charges:  $Gait Training: 8-22 mins                     [facility] PT Acute Rehabilitation Services Pager [phone] Office [phone]    Angelina Ok [facility] 04/30/2019, 4:13 PM

## 2019-04-30 NOTE — Progress Notes (Signed)
NAME:  Regina Munoz, MRN:  [id number], DOB:  Dec 23, 1950, LOS: 9 ADMISSION DATE:  04/21/2019, CONSULTATION DATE: 04/27/2019 REFERRING MD: Dr. Margo Aye, CHIEF COMPLAINT: Bilateral pulmonary embolism, rectus muscle hematoma  Brief History   Patient was admitted with difficulty breathing Recent positive Covid 19 11/30 Admitted for COVID-19 pneumonia started on dexamethasone and remdesivir Increased hypoxemia on 12/ 9-patient had a CT angio showing pulmonary embolism Patient was started on heparin This morning started complaining of abdominal pain, had a CT scan of the abdomen revealing wall hematoma ICU consulted  History of present illness   COVID-19 pneumonia Pulmonary embolism 12 /9 L Rectus sheath/pelvic hematoma12/12  Significant Hospital Events   Pulmonary embolism 12/9 Rectus sheath hematoma 12/12 Transfusion reaction of hives /p 2nd U RBCs 12/13  Consults:  PCCM IR   Procedures:  None  Significant Diagnostic Tests:  1. Massive pulmonary embolus within both main pulmonary arteries extending into all proximal lobar branches with CT evidence of right heart strain (RV/LV Ratio = 1.65) consistent with at least submassive (intermediate risk) PE. The presence of right heart strain has been associated with an increased risk of morbidity and mortality. Please activate Code PE by paging [phone]. 2. Multifocal pulmonary ground-glass opacity without specific evidence of infection 3. Cholelithiasis.  12/12 IMPRESSION: VASCULAR  Large pelvic hematoma is noted which appears to originate from the inferior portion of the left rectus muscle, most consistent with spontaneous hemorrhage, potentially related to anticoagulation. This causes displacement and compression of the urinary bladder. There is seen some irregular high density along the medial portion of the left inferior rectus muscle which may represent contrast extravasation and possibly active hemorrhage.  Critical Value/emergent results were called by telephone at the time of interpretation on 04/27/2019 at 5:05 pm to providerCAROLE HALL , who verbally acknowledged these results.  Mild cholelithiasis is noted.  Minimally nodular hepatic margins are noted suggesting possible hepatic cirrhosis.  Echocardiogram 1210 Normal right-sided heart function  Micro Data:  SARS coronavirus +11/30 (needs 21 days of isolation)  Antimicrobials/COVID 19 Tx:  Remdesivir 12/6>>12/10 Dexamethasone 12/6>>12/14  Interim history/subjective:  No events overnight No new complaints Down to 4L [place] and comfortable   Objective   Blood pressure (!) 125/55, pulse 73, temperature 99.3 F (37.4 C), temperature source Axillary, resp. rate (!) 25, height 5\' 3"  (1.6 m), weight 108.2 kg, SpO2 96 %.        Intake/Output Summary (Last 24 hours) at 04/30/2019 0840 Last data filed at 04/30/2019 0400 Gross per 24 hour  Intake 896.25 ml  Output 1050 ml  Net -153.75 ml   Filed Weights   04/22/19 0604 04/25/19 0326 04/27/19 2047  Weight: 107 kg 111.8 kg 108.2 kg    Examination: General: Obese female, older than stated age HENT: [place]/AT, PERRL, EOM-I and MMM,  in place CV: RRR, Nl S1/S2 and -M/R/G Lungs: Coarse BS diffusely ABD: Soft, NT, ND and +BS GU: purewick in place  EXT: -edema and -tenderness Skin: Rash is better since transfusion Neuro: Alert and oriented, moving all ext to command Psych: Appropriate mood, insight and judgment for time and situation   I reviewed chest CT myself, PE noted  Resolved Hospital Problem list   Hypotension   Assessment & Plan:   Large pelvic hematoma with acute blood loss anemia S/P 2 U RBCs with hives /p 2nd unit-resolved - Hgb 8.1>>7.5>>7.3. - Remains hemodynamically stable - Aspirin and AC on hold, IVC filter in place - Follow H&H Q4 for now - Repeat CT  without contrast if change in status, in the meantime, watch since IVC filter is in place - IR following    Submassive pulmonary embolism L posterior tibial veins, and left peroneal veins DVTs - Was being anticoagulated with heparin but not needed now with IVC filter in place - Complicated by pelvic hematoma - Hold off PT for now - Supplemental O2 to maintain 88-92% - Titrate O2 for sat of 88-92%  COVID-19 pneumonia - Completed course of remdesivir - Completed course of dexamethasone - In theory should be outside infectious stage  If no changes by afternoon will transfer to progressive and to [facility] with PCCM off 12/16  Discussed with bedside RN  Best practice:  Diet: NPO for now Pain/Anxiety/Delirium protocol (if indicated): oxycodone moderate, dilaudid severe VAP protocol (if indicated): Not indicated DVT prophylaxis: on hold Glucose control: Monitor Mobility: Bedrest Code Status: Full code Family Communication: Per primary Disposition: ICU  Rush Farmer, M.D. [facility] Pulmonary/Critical Care Medicine.

## 2019-05-01 LAB — BASIC METABOLIC PANEL
Anion gap: 10 (ref 5–15)
BUN: 15 mg/dL (ref 8–23)
CO2: 24 mmol/L (ref 22–32)
Calcium: 9.3 mg/dL (ref 8.9–10.3)
Chloride: 106 mmol/L (ref 98–111)
Creatinine, Ser: 0.77 mg/dL (ref 0.44–1.00)
GFR calc Af Amer: >60 mL/min (ref >60)
GFR calc non Af Amer: >60 mL/min (ref >60)
Glucose, Bld: 156 mg/dL — ABNORMAL HIGH (ref 70–99)
Potassium: 4 mmol/L (ref 3.5–5.1)
Sodium: 140 mmol/L (ref 135–145)

## 2019-05-01 LAB — CBC
HCT: 33.6 % — ABNORMAL LOW (ref 36.0–46.0)
Hemoglobin: 10.6 g/dL — ABNORMAL LOW (ref 12.0–15.0)
MCH: 26.8 pg (ref 26.0–34.0)
MCHC: 31.5 g/dL (ref 30.0–36.0)
MCV: 85.1 fL (ref 80.0–100.0)
Platelets: 120 10*3/uL — ABNORMAL LOW (ref 150–400)
RBC: 3.95 MIL/uL (ref 3.87–5.11)
RDW: 20.7 % — ABNORMAL HIGH (ref 11.5–15.5)
WBC: 9.8 10*3/uL (ref 4.0–10.5)
nRBC: 0 % (ref 0.0–0.2)

## 2019-05-01 LAB — GLUCOSE, CAPILLARY
Glucose-Capillary: 163 mg/dL — ABNORMAL HIGH (ref 70–99)
Glucose-Capillary: 153 mg/dL — ABNORMAL HIGH (ref 70–99)
Glucose-Capillary: 124 mg/dL — ABNORMAL HIGH (ref 70–99)
Glucose-Capillary: 148 mg/dL — ABNORMAL HIGH (ref 70–99)

## 2019-05-01 LAB — FIBRINOGEN: Fibrinogen: 282 mg/dL (ref 210–475)

## 2019-05-01 LAB — FERRITIN: Ferritin: 59 ng/mL (ref 11–307)

## 2019-05-01 LAB — LACTATE DEHYDROGENASE: LDH: 196 U/L — ABNORMAL HIGH (ref 98–192)

## 2019-05-01 LAB — MAGNESIUM: Magnesium: 2 mg/dL (ref 1.7–2.4)

## 2019-05-01 LAB — C-REACTIVE PROTEIN: CRP: 7.6 mg/dL — ABNORMAL HIGH (ref <1.0)

## 2019-05-01 LAB — PHOSPHORUS: Phosphorus: 2.9 mg/dL (ref 2.5–4.6)

## 2019-05-01 LAB — D-DIMER, QUANTITATIVE: D-Dimer, Quant: 15.85 ug/mL-FEU — ABNORMAL HIGH (ref 0.00–0.50)

## 2019-05-01 NOTE — Progress Notes (Signed)
Occupational Therapy Progress Note  Pt seen in conjunction with PT.  She was able to ambulate in room with min guard assist, with one LOB requiring mod A recover, min A - min guard assist for sit to stand depending on seat height. She was able to perform grooming standing at sink with min guard assist.  02 sats dropped to 84% on RA, but rebounded quickly to 92% with 2L supplemental 02 and pursed lip breathing.    No OT follow up recommended.    04/30/19 1520  OT Visit Information  Last OT Received On 05/01/19  Assistance Needed +1  PT/OT/SLP Co-Evaluation/Treatment Yes  Reason for Co-Treatment For patient/therapist safety;To address functional/ADL transfers  OT goals addressed during session ADL's and self-care  History of Present Illness 68 y.o. female with medical history significant for diabetes mellitus, asthma and anxiety.  Patient presented to the ED with complaints of difficulty breathing, cough, fever chills nausea vomiting loss of taste and diarrhea. Patient tested positive for COVID-19 11/30. Admitted 04/21/19 for treatment of COVID PNA. Pt developed bil PE and placed on anticoagulants. Developed rectus sheath/pelvic hematoma on 12/12. IVC filter placed in radiology on 12/14.  Precautions  Precautions None  Pain Assessment  Pain Assessment Faces  Faces Pain Scale 4  Pain Location abdomen with mobility  Pain Descriptors / Indicators Guarding;Grimacing;Sore  Pain Intervention(s) Limited activity within patient's tolerance;Monitored during session  Cognition  Arousal/Alertness Awake/alert  Behavior During Therapy [facility] for tasks assessed/performed  Overall Cognitive Status Within Functional Limits for tasks assessed  Upper Extremity Assessment  Upper Extremity Assessment Overall WFL for tasks assessed  Lower Extremity Assessment  Lower Extremity Assessment Defer to PT evaluation  ADL  Overall ADL's  Needs assistance/impaired  Grooming Wash/dry hands;Oral care;Min guard;Standing   Grooming Details (indicate cue type and reason) 02 sats to 88% on RA during grooming standing at sink   Toilet Transfer Moderate assistance;Ambulation;Comfort height toilet;BSC;RW  Toilet Transfer Details (indicate cue type and reason) Pt with LOB while ambulating into BR requiring mod A to recover.  Required close min guard assist for remainder of task   Functional mobility during ADLs Min guard;Moderate assistance;Rolling walker  Bed Mobility  Overal bed mobility Needs Assistance  Bed Mobility Sidelying to Sit  Sidelying to sit Min guard;HOB elevated  General bed mobility comments Incr time and use of rail due to abdominal pain  Balance  Overall balance assessment Needs assistance  Sitting-balance support Feet supported;No upper extremity supported  Sitting balance-Leahy Scale Fair  Standing balance support During functional activity;Single extremity supported  Standing balance-Leahy Scale Poor  Standing balance comment UE support and supervision for static standing  Restrictions  Weight Bearing Restrictions No  Transfers  Overall transfer level Needs assistance  Equipment used Rolling walker (2 wheeled)  Transfers Sit to/from Stand  Sit to Stand Min assist  General transfer comment Assist to bring hips up and for balance.  General Comments  General comments (skin integrity, edema, etc.) At rest on RA SpO2 >92%. With activity pt with SpO2 84%. Able to recover to 90% with rest and pursed lip breathing. With O2 placed at 1L after amb returned to 98%.   OT - End of Session  Equipment Utilized During [employer] walker;Oxygen  Activity Tolerance Patient limited by fatigue  Patient left in chair;with call bell/phone within reach  Nurse Communication Mobility status  OT Assessment/Plan  OT Plan Discharge plan remains appropriate  OT Visit Diagnosis Muscle weakness (generalized) (M62.81)  OT Frequency (ACUTE ONLY)  Min 2X/week  Follow Up Recommendations No OT follow  up;Supervision - Intermittent  OT Equipment None recommended by OT  AM-PAC OT "6 Clicks" Daily Activity Outcome Measure (Version 2)  Help from another person eating meals? 4  Help from another person taking care of personal grooming? 3  Help from another person toileting, which includes using toliet, bedpan, or urinal? 3  Help from another person bathing (including washing, rinsing, drying)? 3  Help from another person to put on and taking off regular upper body clothing? 3  Help from another person to put on and taking off regular lower body clothing? 3  6 Click Score 19  OT Goal Progression  Progress towards OT goals Progressing toward goals  Acute Rehab OT Goals  Patient Stated Goal get her strength back   OT Time Calculation  OT Start Time (ACUTE ONLY) 1359  OT Stop Time (ACUTE ONLY) 1434  OT Time Calculation (min) 35 min  OT General Charges  $OT Visit 1 Visit  OT Treatments  $Self Care/Home Management  8-22 mins  Eber Jones., OTR/L Acute Rehabilitation Services Pager [phone] Office [phone]

## 2019-05-01 NOTE — Progress Notes (Signed)
PROGRESS NOTE    Regina Munoz  BPZ:[id number] DOB: 1951-02-19 DOA: 04/21/2019 PCP: Scotty Court, DO   Brief Narrative: 68 year old with past medical history significant for type 2 diabetes,  obesity, asthma and anxiety who presents to the ED complaining of shortness of breath and cough, associated with fever, chills, nausea and diarrhea.  Patient reports  symptoms started 2 weeks ago and have progressed.  Patient and spouse and son tested positive for COVID-19.  Patient tested positive for COVID-19 on 04/15/2019.  She presented with acute hypoxic respiratory failure, oxygen saturation 80 on room air.  Chest x-ray show patchy infiltrates worse at the bases.  Patient was a started on remdesivir and Decadron.  Hospital course was complicated by worsening hypoxemia on ambulation.  CTA was positive for massive bilateral pulmonary embolism.  She was a started on heparin drip.  Patient then developed left lower quadrant abdominal pain for which she had a CT abdomen and pelvis done without contrast which show a spontaneous left pelvic hematoma.  IR was consulted for possible embolization.  Patient hemoglobin dropped to 6.7.  She was transfused 2 unit of packed red blood cell on 04/28/2019.  Repeated CT abdomen 12/13 showed no significant change in their large pelvic hematoma.  Patient had lower extremity ultrasound which showed on the left finding consistent with age indeterminate deep vein thrombosis involving the left posterior tibial veins and left peroneal veins.  IR was consulted for IVC filter placement.  Patient underwent successful IVC filter placement on 04/29/2019  Assessment & Plan:   Active Problems:   Pneumonia due to COVID-19 virus  1-Acute Covid 19 Viral PNA;  Covid 19 positive 04/15/19. Completed 5 days of remdesivir. Received Decadron, which was stop due to acute blood loss anemia Continue albuterol and inhalers Continue vitamin C D and sink Patient develop worsening  hypoxemia on ambulation, CT angio was obtained and read revealed submassive bilateral PE   2-Submassive bilateral PE right-sided heart strain on CT She was a started on IV heparin Patient develop pelvic hematoma on anticoagulation.  Hemoglobin dropped to 6. Heparin drip discontinued. Patient had IVC filter placement on 12/14 Patient will require oxygen on ambulation at discharge Echo showed preserved left ventricular ejection fraction, right ventricular function normal.  A spontaneous large left pelvic hematoma On anticoagulation Aspirin and Decadron discontinue She was transferred to ICU Hemoglobin decreased to 6.7, received 2 units of packed red blood cell.  Posttransfusion developed rash. CT abdomen and pelvis show stability of hematoma Hemoglobin remained stable at 9  Left lower extremity DVT age-indeterminate: Underwent IVC filter placement  Acute blood loss anemia, hypotension: Received fluid and 2 units of packed red blood cell Blood pressure stable  Elevated troponin: From acute massive bilateral PE and demand ischemia.  Type 2 diabetes with hyperglycemia poorly controlled hemoglobin A1c 7.8 Continue with Lantus and NovoLog  Chronic anxiety and depression; continue with home medication SCD BMI 41: Continue weight loss.   Nutrition Problem: Inadequate oral intake Etiology: decreased appetite    Signs/Symptoms: per patient/family report    Interventions: Ensure Enlive (each supplement provides 350kcal and 20 grams of protein)  Estimated body mass index is 42.26 kg/m as calculated from the following:   Height as of this encounter: 5\' 3"  (1.6 m).   Weight as of this encounter: 108.2 kg.   DVT prophylaxis: SCD Code Status: Full code Family Communication: care discussed with patient Disposition Plan: Monitor hb, home in 24 to 48 hour if hb and respiratory status  are stable.  Consultants:   CCM  IR    Procedures:   IVC filter  placement  Antimicrobials:    Subjective: Patient is alert and oriented x3.  She denies worsening chest pain or shortness of breath.  Ports improvement of lower abdominal pain  Objective: Vitals:   05/01/19 0805 05/01/19 0900 05/01/19 1133 05/01/19 1400  BP:  (!) 130/54  126/60  Pulse:  94  87  Resp:  20  (!) 28  Temp: 98.1 F (36.7 C)  98.5 F (36.9 C)   TempSrc: Oral  Oral   SpO2:  94%  96%  Weight:      Height:        Intake/Output Summary (Last 24 hours) at 05/01/2019 1529 Last data filed at 05/01/2019 0200 Gross per 24 hour  Intake 0 ml  Output --  Net 0 ml   Filed Weights   04/22/19 0604 04/25/19 0326 04/27/19 2047  Weight: 107 kg 111.8 kg 108.2 kg    Examination:  General exam: Appears calm and comfortable  Respiratory system: Clear to auscultation. Respiratory effort normal. Cardiovascular system: S1 & S2 heard, RRR. No JVD, murmurs, rubs, gallops or clicks. No pedal edema. Gastrointestinal system: Abdomen is nondistended, soft and nontender. No organomegaly or masses felt. Normal bowel sounds heard. Central nervous system: Alert and oriented. No focal neurological deficits. Extremities: Symmetric 5 x 5 power. Skin: No rashes, lesions or ulcers Psychiatry: Judgement and insight appear normal. Mood & affect appropriate.     Data Reviewed: I have personally reviewed following labs and imaging studies  CBC: Recent Labs  Lab 04/27/19 0419 04/28/19 0652 04/29/19 0339 04/29/19 1045 04/29/19 1620 04/29/19 2319 04/30/19 0320 05/01/19 0500  WBC 8.2 12.3* 6.6  --   --  9.1 9.2 9.8  NEUTROABS 6.3 7.9* 4.9  --   --  5.4 5.6  --   HGB 11.4* 6.7* 7.5* 7.1* 8.6* 9.8* 9.8* 10.6*  HCT 36.7 22.0* 23.8* 22.3* 26.7* 29.6* 29.5* 33.6*  MCV 77.3* 82.1 82.1  --   --  81.1 82.2 85.1  PLT 154 149* 112*  --   --  99* 97* 120*   Basic Metabolic Panel: Recent Labs  Lab 04/27/19 1548 04/28/19 0445 04/29/19 0339 04/30/19 0320 05/01/19 0500  NA 138 140 137 138 140   K 4.4 4.6 4.3 4.1 4.0  CL 105 108 105 107 106  CO2 21* 20* 21* 21* 24  GLUCOSE 210* 181* 251* 188* 156*  BUN 23 20 12 18 15   CREATININE 1.07* 1.01* 0.71 0.81 0.77  CALCIUM 8.3* 8.8* 9.5 9.5 9.3  MG  --   --   --   --  2.0  PHOS  --   --   --   --  2.9   GFR: Estimated Creatinine Clearance: 80.5 mL/min (by C-G formula based on SCr of 0.77 mg/dL). Liver Function Tests: Recent Labs  Lab 04/26/19 0500 04/27/19 0419 04/28/19 0445 04/29/19 0339 04/30/19 0320  AST 35 23 37 23 24  ALT 19 17 22 21 19   ALKPHOS 86 79 47 34* 42  BILITOT 0.6 0.9 1.5* 1.7* 2.5*  PROT 6.7 6.3* 6.5 6.4* 6.4*  ALBUMIN 2.6* 2.6* 4.2 4.9 4.8   No results for input(s): LIPASE, AMYLASE in the last 168 hours. No results for input(s): AMMONIA in the last 168 hours. Coagulation Profile: Recent Labs  Lab 04/27/19 2018  INR 1.4*   Cardiac Enzymes: No results for input(s): CKTOTAL, CKMB, CKMBINDEX, TROPONINI in the  last 168 hours. BNP (last 3 results) No results for input(s): PROBNP in the last 8760 hours. HbA1C: No results for input(s): HGBA1C in the last 72 hours. CBG: Recent Labs  Lab 04/30/19 1255 04/30/19 1525 04/30/19 2116 05/01/19 0752 05/01/19 1107  GLUCAP 167* 141* 182* 163* 153*   Lipid Profile: No results for input(s): CHOL, HDL, LDLCALC, TRIG, CHOLHDL, LDLDIRECT in the last 72 hours. Thyroid Function Tests: No results for input(s): TSH, T4TOTAL, FREET4, T3FREE, THYROIDAB in the last 72 hours. Anemia Panel: Recent Labs    04/30/19 0320 05/01/19 0500  FERRITIN 51 59   Sepsis Labs: Recent Labs  Lab 04/27/19 1556 04/27/19 1748  LATICACIDVEN 3.9* 4.0*    Recent Results (from the past 240 hour(s))  Blood Culture (routine x 2)     Status: None   Collection Time: 04/21/19  5:38 PM   Specimen: BLOOD LEFT ARM  Result Value Ref Range Status   Specimen Description BLOOD LEFT ARM  Final   Special Requests   Final    BOTTLES DRAWN AEROBIC AND ANAEROBIC Blood Culture adequate volume    Culture   Final    NO GROWTH 5 DAYS Performed at [facility], [address] [place], [place] [address]    Report Status 04/26/2019 FINAL  Final  Blood Culture (routine x 2)     Status: None   Collection Time: 04/21/19  5:38 PM   Specimen: Right Antecubital; Blood  Result Value Ref Range Status   Specimen Description RIGHT ANTECUBITAL  Final   Special Requests   Final    BOTTLES DRAWN AEROBIC AND ANAEROBIC Blood Culture adequate volume   Culture   Final    NO GROWTH 5 DAYS Performed at [facility], [address] [place], [place] [address]    Report Status 04/26/2019 FINAL  Final  MRSA PCR Screening     Status: None   Collection Time: 04/23/19 12:35 PM   Specimen: Nasal Mucosa; Nasopharyngeal  Result Value Ref Range Status   MRSA by PCR NEGATIVE NEGATIVE Final    Comment:        The GeneXpert MRSA Assay (FDA approved for NASAL specimens only), is one component of a comprehensive MRSA colonization surveillance program. It is not intended to diagnose MRSA infection nor to guide or monitor treatment for MRSA infections. Performed at [facility], [address] [place], [place] [address]          Radiology Studies: IR IVC FILTER PLMT / S&I Lenise Arena/IMG GUID/MOD SED  Result Date: 04/29/2019 INDICATION: History of COVID-19 infection complicated by pulmonary embolism and lower extremity DVT further complicated by development of a retroperitoneal hematoma following the initiation anticoagulation. As such, patient is no longer a candidate for anticoagulation the given presence of lower extremity DVT, request made for placement of an IVC filter for temporary caval interruption purposes. EXAM: ULTRASOUND GUIDANCE FOR VASCULAR ACCESS IVC CATHETERIZATION AND VENOGRAM IVC FILTER INSERTION COMPARISON:  CT abdomen pelvis-04/28/2019 MEDICATIONS: None. ANESTHESIA/SEDATION: Fentanyl 50 mcg IV; Versed 1 mg IV Sedation Time: 10 minutes; The patient was continuously monitored during the  procedure by the interventional radiology nurse under my direct supervision. CONTRAST:  40 cc Isovue-300 FLUOROSCOPY TIME:  2 minutes, 30 seconds (31.5 mGy) COMPLICATIONS: None immediate PROCEDURE: Informed consent was obtained from the patient following explanation of the procedure, risks, benefits and alternatives. The patient understands, agrees and consents for the procedure. All questions were addressed. A time out was performed prior to the initiation of the procedure.  Maximal barrier sterile technique utilized including caps, mask, sterile gowns, sterile gloves, large sterile drape, hand hygiene, and Betadine prep. Under sterile condition and local anesthesia, right internal jugular venous access was performed with ultrasound. An ultrasound image was saved and sent to PACS. Over a guidewire, the IVC filter delivery sheath and inner dilator were advanced into the IVC just above the IVC bifurcation. Contrast injection was performed for an IVC venogram. Through the delivery sheath, a retrievable Denali IVC filter was deployed below the level of the renal veins and above the IVC bifurcation. Limited post deployment venacavagram was performed. The delivery sheath was removed and hemostasis was obtained with manual compression. A dressing was placed. The patient tolerated the procedure well without immediate post procedural complication. FINDINGS: The IVC is patent. No evidence of thrombus, stenosis, or occlusion. No variant venous anatomy. Successful placement of the IVC filter below the level of the renal veins. Incidentally noted laminated gallstones overlying the right upper abdominal quadrant as demonstrated on preceding abdominal CT. IMPRESSION: Successful ultrasound and fluoroscopically guided placement of an infrarenal retrievable IVC filter via right jugular approach. PLAN: This IVC filter is potentially retrievable. The patient will be assessed for filter retrieval by Interventional Radiology in  approximately 8-12 weeks. Further recommendations regarding filter retrieval, continued surveillance or declaration of device permanence, will be made at that time. Electronically Signed   By: Simonne Come M.D.   On: 04/29/2019 17:51        Scheduled Meds: . sodium chloride   Intravenous Once  . sodium chloride   Intravenous Once  . ascorbic acid  500 mg Oral Daily  . buPROPion  100 mg Oral Daily  . Chlorhexidine Gluconate Cloth  6 each Topical Daily  . cholecalciferol  1,000 Units Oral Daily  . dextromethorphan-guaiFENesin  1 tablet Oral BID  . feeding supplement (ENSURE ENLIVE)  237 mL Oral BID BM  . insulin aspart  7 Units Subcutaneous TID WC  . insulin glargine  10 Units Subcutaneous Daily  . mouth rinse  15 mL Mouth Rinse BID  . pantoprazole  40 mg Oral Daily  . senna-docusate  1 tablet Oral QHS  . zinc sulfate  220 mg Oral Daily   Continuous Infusions:   LOS: 10 days    Time spent: 35 minutes.     Alba Cory, MD [facility]   If 7PM-7AM, please contact night-coverage [email or web address] Password [email or web address] 05/01/2019, 3:29 PM

## 2019-05-02 DIAGNOSIS — I2699 Other pulmonary embolism without acute cor pulmonale: Secondary | ICD-10-CM

## 2019-05-02 DIAGNOSIS — R0902 Hypoxemia: Secondary | ICD-10-CM

## 2019-05-02 DIAGNOSIS — E119 Type 2 diabetes mellitus without complications: Secondary | ICD-10-CM

## 2019-05-02 LAB — FERRITIN: Ferritin: 65 ng/mL (ref 11–307)

## 2019-05-02 LAB — CBC
HCT: 34.6 % — ABNORMAL LOW (ref 36.0–46.0)
Hemoglobin: 10.9 g/dL — ABNORMAL LOW (ref 12.0–15.0)
MCH: 27 pg (ref 26.0–34.0)
MCHC: 31.5 g/dL (ref 30.0–36.0)
MCV: 85.6 fL (ref 80.0–100.0)
Platelets: 132 10*3/uL — ABNORMAL LOW (ref 150–400)
RBC: 4.04 MIL/uL (ref 3.87–5.11)
RDW: 20.9 % — ABNORMAL HIGH (ref 11.5–15.5)
WBC: 10.1 10*3/uL (ref 4.0–10.5)
nRBC: 0.2 % (ref 0.0–0.2)

## 2019-05-02 LAB — BASIC METABOLIC PANEL
Anion gap: 11 (ref 5–15)
BUN: 12 mg/dL (ref 8–23)
CO2: 24 mmol/L (ref 22–32)
Calcium: 9.2 mg/dL (ref 8.9–10.3)
Chloride: 104 mmol/L (ref 98–111)
Creatinine, Ser: 0.79 mg/dL (ref 0.44–1.00)
GFR calc Af Amer: >60 mL/min (ref >60)
GFR calc non Af Amer: >60 mL/min (ref >60)
Glucose, Bld: 171 mg/dL — ABNORMAL HIGH (ref 70–99)
Potassium: 3.7 mmol/L (ref 3.5–5.1)
Sodium: 139 mmol/L (ref 135–145)

## 2019-05-02 LAB — GLUCOSE, CAPILLARY: Glucose-Capillary: 170 mg/dL — ABNORMAL HIGH (ref 70–99)

## 2019-05-02 LAB — FIBRINOGEN: Fibrinogen: 321 mg/dL (ref 210–475)

## 2019-05-02 LAB — C-REACTIVE PROTEIN: CRP: 6.4 mg/dL — ABNORMAL HIGH (ref <1.0)

## 2019-05-02 LAB — D-DIMER, QUANTITATIVE: D-Dimer, Quant: 12.11 ug/mL-FEU — ABNORMAL HIGH (ref 0.00–0.50)

## 2019-05-02 LAB — LACTATE DEHYDROGENASE: LDH: 216 U/L — ABNORMAL HIGH (ref 98–192)

## 2019-05-02 MED ORDER — "PEN NEEDLES 3/16"" 31G X 5 MM MISC": 1.0000 "application " | Freq: Every day | 10 refills | Status: DC

## 2019-05-02 MED ORDER — DM-GUAIFENESIN ER 30-600 MG PO TB12: 1.0000 | ORAL_TABLET | Freq: Two times a day (BID) | ORAL | 0 refills | Status: DC

## 2019-05-02 MED ORDER — INSULIN GLARGINE 100 UNIT/ML SOLOSTAR PEN: 10.0000 [IU] | PEN_INJECTOR | Freq: Every day | SUBCUTANEOUS | 11 refills | Status: DC

## 2019-05-02 MED ORDER — SPIRIVA HANDIHALER 18 MCG IN CAPS: 18.0000 ug | ORAL_CAPSULE | Freq: Two times a day (BID) | RESPIRATORY_TRACT | 12 refills | Status: DC

## 2019-05-02 MED ORDER — ZINC SULFATE 220 (50 ZN) MG PO CAPS: 220.0000 mg | ORAL_CAPSULE | Freq: Every day | ORAL | 0 refills | Status: DC

## 2019-05-02 MED ORDER — POLYETHYLENE GLYCOL 3350 17 G PO PACK: 17.0000 g | PACK | Freq: Every day | ORAL | 0 refills | Status: DC | PRN

## 2019-05-02 MED ORDER — ALBUTEROL SULFATE HFA 108 (90 BASE) MCG/ACT IN AERS: 2.0000 | INHALATION_SPRAY | RESPIRATORY_TRACT | 0 refills | Status: AC | PRN

## 2019-05-02 MED ORDER — ASCORBIC ACID 500 MG PO TABS: 500.0000 mg | ORAL_TABLET | Freq: Every day | ORAL | 0 refills | Status: DC

## 2019-05-02 MED ORDER — BLOOD GLUCOSE MONITOR KIT: PACK | 0 refills | Status: DC

## 2019-05-02 MED ORDER — INSULIN STARTER KIT- PEN NEEDLES (ENGLISH)
1.0000 | Freq: Once | Status: DC
  Filled 2019-05-02: qty 1

## 2019-05-02 MED ORDER — INSULIN GLARGINE 100 UNIT/ML ~~LOC~~ SOLN: 10.0000 [IU] | Freq: Every day | SUBCUTANEOUS | 11 refills | Status: DC

## 2019-05-02 NOTE — Discharge Summary (Signed)
Physician Discharge Summary  Regina Munoz:[id number] DOB: 05-27-1950 DOA: 04/21/2019  PCP: Scotty Court, DO  Admit date: 04/21/2019 Discharge date: 05/02/2019  Admitted From: Home Disposition:  Home   Recommendations for Outpatient Follow-up:  1. Follow up with PCP in 1-2 weeks 2. Please obtain BMP/CBC in one week 3. Needs to follow up with PCP and might need referral to hematology for PE  Home Health: yes.  Equipment/Devices; 2 L oxygen  Discharge Condition: Stable.  CODE STATUS: Full code Diet recommendation: Heart Healthy / Carb Modified / Regular / Dysphagia   Brief/Interim Summary: 68 year old with past medical history significant for type 2 diabetes,  obesity, asthma and anxiety who presents to the ED complaining of shortness of breath and cough, associated with fever, chills, nausea and diarrhea.  Patient reports  symptoms started 2 weeks ago and have progressed.  Patient and spouse and son tested positive for COVID-19.  Patient tested positive for COVID-19 on 04/15/2019.  She presented with acute hypoxic respiratory failure, oxygen saturation 80 on room air.  Chest x-ray show patchy infiltrates worse at the bases.  Patient was a started on remdesivir and Decadron.  Hospital course was complicated by worsening hypoxemia on ambulation.  CTA was positive for massive bilateral pulmonary embolism.  She was a started on heparin drip.  Patient then developed left lower quadrant abdominal pain for which she had a CT abdomen and pelvis done without contrast which show a spontaneous left pelvic hematoma.  IR was consulted for possible embolization. Patient hemoglobin dropped to 6.7.  She was transfused 2 unit of packed red blood cell on 04/28/2019.  Repeated CT abdomen 12/13 showed no significant change of  large pelvic hematoma.  Patient had lower extremity ultrasound which showed on the left finding consistent with age indeterminate deep vein thrombosis involving the left  posterior tibial veins and left peroneal veins.  IR was consulted for IVC filter placement.  Patient underwent successful IVC filter placement on 04/29/2019   Discharge Diagnoses:  Active Problems:   Pneumonia due to COVID-19 virus   Bilateral pulmonary embolism (HCC)   Hypoxia  1-Acute Covid 19 Viral PNA;  Covid 19 positive 04/15/19. Completed 5 days of remdesivir. Received Decadron, which was stop due to acute blood loss anemia Continue albuterol and inhalers Continue vitamin C D and sink Patient develop worsening hypoxemia on ambulation, CT angio was obtained and read revealed submassive bilateral PE   2-Submassive bilateral PE right-sided heart strain on CT She was a started on IV heparin Patient develop pelvic hematoma on anticoagulation.  Hemoglobin dropped to 6. Heparin drip discontinued. Patient had IVC filter placement on 12/14 Patient will require oxygen on ambulation at discharge Echo showed preserved left ventricular ejection fraction, right ventricular function normal. Discussed with Dr Nelda Marseille, no further anticoagulation due to large pelvis hematoma.  Patient could be refer to hematology in couples of weeks, when bleeds stable for further discussion of anticoagulation.   A spontaneous large left pelvic hematoma On anticoagulation Aspirin and Decadron discontinue She was transferred to ICU Hemoglobin decreased to 6.7, received 2 units of packed red blood cell.  Posttransfusion developed rash. CT abdomen and pelvis show stability of hematoma Hemoglobin remained stable at 9  Left lower extremity DVT age-indeterminate: Underwent IVC filter placement  Acute blood loss anemia, hypotension: Received fluid and 2 units of packed red blood cell Blood pressure stable hb stable/   Elevated troponin: From acute massive bilateral PE and demand ischemia.  Type 2 diabetes with  hyperglycemia poorly controlled hemoglobin A1c 7.8 Continue with Lantus and  NovoLog  Chronic anxiety and depression; continue with home medication SCD BMI 41: Continue weight loss.   Discharge Instructions  Discharge Instructions    Diet - low sodium heart healthy   Complete by: As directed    Increase activity slowly   Complete by: As directed    MyChart COVID-19 home monitoring program   Complete by: May 02, 2019    Is the patient willing to use the [facility] [place] for home monitoring?: Yes   Temperature monitoring   Complete by: May 02, 2019    After how many days would you like to receive a notification of this patient's flowsheet entries?: 1     Allergies as of 05/02/2019   No Known Allergies     Medication List    STOP taking these medications   aspirin EC 81 MG tablet     TAKE these medications   acetaminophen 500 MG tablet Commonly known as: TYLENOL Take 1,000 mg by mouth every 6 (six) hours as needed for mild pain or moderate pain.   albuterol 108 (90 Base) MCG/ACT inhaler Commonly known as: VENTOLIN HFA Inhale 2 puffs into the lungs every 4 (four) hours as needed for wheezing or shortness of breath.   ascorbic acid 500 MG tablet Commonly known as: VITAMIN C Take 1 tablet (500 mg total) by mouth daily.   blood glucose meter kit and supplies Kit Dispense based on patient and insurance preference. Use up to four times daily as directed. (FOR ICD-9 250.00, 250.01).   buPROPion 150 MG 12 hr tablet Commonly known as: ZYBAN Take 75 mg by mouth every morning.   cetirizine 10 MG tablet Commonly known as: ZYRTEC Take 10 mg by mouth daily.   cholecalciferol 1000 units tablet Commonly known as: VITAMIN D Take 1,000 Units by mouth daily.   dextromethorphan-guaiFENesin 30-600 MG 12hr tablet Commonly known as: MUCINEX DM Take 1 tablet by mouth 2 (two) times daily.   diclofenac Sodium 1 % Gel Commonly known as: VOLTAREN Apply 2 g topically daily as needed (for pain).   Insulin Glargine 100 UNIT/ML Solostar Pen Commonly known  as: LANTUS Inject 10 Units into the skin daily.   metFORMIN 500 MG tablet Commonly known as: GLUCOPHAGE Take 500 mg by mouth 2 (two) times daily with a meal.   omeprazole 20 MG capsule Commonly known as: PRILOSEC Take 20 mg by mouth daily.   Pen Needles 3/16" 31G X 5 MM Misc 1 application by Does not apply route daily.   polyethylene glycol 17 g packet Commonly known as: MIRALAX / GLYCOLAX Take 17 g by mouth daily as needed for mild constipation.   Spiriva HandiHaler 18 MCG inhalation capsule Generic drug: tiotropium Place 1 capsule (18 mcg total) into inhaler and inhale 2 (two) times daily.   zinc sulfate 220 (50 Zn) MG capsule Take 1 capsule (220 mg total) by mouth daily.            Durable Medical Equipment  (From admission, onward)         Start     Ordered   05/01/19 1529  For home use only DME oxygen  Once    Question Answer Comment  Length of Need 6 Months   Mode or (Route) Nasal cannula   Liters per Minute 2   Frequency Continuous (stationary and portable oxygen unit needed)   Oxygen delivery system Gas      05/01/19 1528  04/24/19 1853  For home use only DME oxygen  Once    Question Answer Comment  Length of Need 6 Months   Mode or (Route) Nasal cannula   Liters per Minute 2   Frequency Continuous (stationary and portable oxygen unit needed)   Oxygen conserving device Yes   Oxygen delivery system Gas      04/24/19 [place] Follow up.   Why: home oxygen  Contact information: [place] [address] [phone]        Octavio Graves P, DO Follow up in 1 week(s).   Contact information: [address] [place] [address] [phone]          No Known Allergies  Consultations:  CCM  IR   Procedures/Studies: CT ABDOMEN PELVIS WO CONTRAST  Result Date: 04/28/2019 CLINICAL DATA:  Follow-up of retroperitoneal hematoma. Pulmonary emboli. EXAM: CT ABDOMEN  AND PELVIS WITHOUT CONTRAST TECHNIQUE: Multidetector CT imaging of the abdomen and pelvis was performed following the standard protocol without IV contrast. COMPARISON:  04/27/2019 FINDINGS: Lower chest: There is a hazy area of density at the right lung base posteriorly which has progressed, consistent with a pulmonary infarct. There is slight increased atelectasis at the left lung base posteriorly. Heart size is normal. Hepatobiliary: Prominent left lobe of the liver with a nodular contour of the liver suggesting cirrhosis. Multiple gallstones. No dilated bile ducts. Pancreas: Unremarkable. No pancreatic ductal dilatation or surrounding inflammatory changes. Spleen: Normal in size without focal abnormality. Adrenals/Urinary Tract: The adrenal glands and kidneys are normal. No hydronephrosis. The bladder is markedly compressed by the large pelvic hematoma. Stomach/Bowel: Stomach is within normal limits. Appendix is not visualized. No evidence of bowel wall thickening, distention, or inflammatory changes. The pelvic hematoma has a mass effect upon the adjacent bowel. Vascular/Lymphatic: No significant vascular findings are present. No enlarged abdominal or pelvic lymph nodes. Reproductive: Status post hysterectomy. No adnexal masses. Other: The large pelvic hematoma is essentially unchanged since the prior study. There is minimally more prominent hemorrhage in the mesentery just above the pelvic hematoma. The hematoma appears to arise from the inferior aspect of the left rectus muscle. Musculoskeletal: No acute or significant osseous findings. IMPRESSION: 1. No significant change in the large pelvic hematoma. The hematoma appears to arise from the inferior aspect of the left rectus muscle. 2. Slightly more prominent but slight a hemorrhage in the mesentery just above the pelvic hematoma. 3. 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 4. Probable cirrhosis. 5. Cholelithiasis. Electronically Signed   By:  Lorriane Shire M.D.   On: 04/28/2019 14:44   CT ANGIO CHEST PE W OR WO CONTRAST  Result Date: 04/24/2019 CLINICAL DATA:  Hypoxemia, cough and shortness of breath. COVID-19 pneumonia. EXAM: CT ANGIOGRAPHY CHEST WITH CONTRAST TECHNIQUE: Multidetector CT imaging of the chest was performed using the standard protocol during bolus administration of intravenous contrast. Multiplanar CT image reconstructions and MIPs were obtained to evaluate the vascular anatomy. CONTRAST:  73m OMNIPAQUE IOHEXOL 350 MG/ML SOLN COMPARISON:  None. FINDINGS: Cardiovascular: --Pulmonary arteries: Contrast injection is sufficient to demonstrate satisfactory opacification of the pulmonary arteries to the segmental level, with attenuation of at least 200 HU at the main pulmonary artery. There is a large pulmonary embolus clot burden within both the right and left main pulmonary arteries extending into all proximal lobar branches. There is evidence of right heart strain with an RV to  LV ratio of 1.65. the main pulmonary artery is within normal limits for size. --Aorta: Satisfactory opacification of the thoracic aorta. No aortic dissection or other acute aortic syndrome. Conventional 3 vessel aortic branching pattern. There is no aortic atherosclerosis. --Heart: Normal size. No pericardial effusion. Mediastinum/Nodes: No mediastinal, hilar or axillary lymphadenopathy. The visualized thyroid and thoracic esophageal course are unremarkable. Lungs/Pleura: Mild ground-glass opacity without focal consolidation. Upper Abdomen: Contrast bolus timing is not optimized for evaluation of the abdominal organs. There are stones within the gallbladder. Musculoskeletal: No chest wall abnormality. No bony spinal canal stenosis. Review of the MIP images confirms the above findings. IMPRESSION: 1. Massive pulmonary embolus within both main pulmonary arteries extending into all proximal lobar branches with CT evidence of right heart strain (RV/LV Ratio = 1.65)  consistent with at least submassive (intermediate risk) PE. The presence of right heart strain has been associated with an increased risk of morbidity and mortality. Please activate Code PE by paging [phone]. 2. Multifocal pulmonary ground-glass opacity without specific evidence of infection 3. Cholelithiasis. Critical Value/emergent results were called by telephone at the time of interpretation on 04/24/2019 at 9:38 pm to provider K. Baltazar Najjar, who verbally acknowledged these results. Electronically Signed   By: Ulyses Jarred M.D.   On: 04/24/2019 21:38   IR IVC FILTER PLMT / S&I [place] Keels GUID/MOD SED  Result Date: 04/29/2019 INDICATION: History of [id number] infection complicated by pulmonary embolism and lower extremity DVT further complicated by development of a retroperitoneal hematoma following the initiation anticoagulation. As such, patient is no longer a candidate for anticoagulation the given presence of lower extremity DVT, request made for placement of an IVC filter for temporary caval interruption purposes. EXAM: ULTRASOUND GUIDANCE FOR VASCULAR ACCESS IVC CATHETERIZATION AND VENOGRAM IVC FILTER INSERTION COMPARISON:  CT abdomen pelvis-04/28/2019 MEDICATIONS: None. ANESTHESIA/SEDATION: Fentanyl 50 mcg IV; Versed 1 mg IV Sedation Time: 10 minutes; The patient was continuously monitored during the procedure by the interventional radiology nurse under my direct supervision. CONTRAST:  40 cc Isovue-300 FLUOROSCOPY TIME:  2 minutes, 30 seconds (47.8 mGy) COMPLICATIONS: None immediate PROCEDURE: Informed consent was obtained from the patient following explanation of the procedure, risks, benefits and alternatives. The patient understands, agrees and consents for the procedure. All questions were addressed. A time out was performed prior to the initiation of the procedure. Maximal barrier sterile technique utilized including caps, mask, sterile gowns, sterile gloves, large sterile drape, hand hygiene, and  Betadine prep. Under sterile condition and local anesthesia, right internal jugular venous access was performed with ultrasound. An ultrasound image was saved and sent to PACS. Over a guidewire, the IVC filter delivery sheath and inner dilator were advanced into the IVC just above the IVC bifurcation. Contrast injection was performed for an IVC venogram. Through the delivery sheath, a retrievable Denali IVC filter was deployed below the level of the renal veins and above the IVC bifurcation. Limited post deployment venacavagram was performed. The delivery sheath was removed and hemostasis was obtained with manual compression. A dressing was placed. The patient tolerated the procedure well without immediate post procedural complication. FINDINGS: The IVC is patent. No evidence of thrombus, stenosis, or occlusion. No variant venous anatomy. Successful placement of the IVC filter below the level of the renal veins. Incidentally noted laminated gallstones overlying the right upper abdominal quadrant as demonstrated on preceding abdominal CT. IMPRESSION: Successful ultrasound and fluoroscopically guided placement of an infrarenal retrievable IVC filter via right jugular approach. PLAN: This IVC filter is potentially retrievable. The patient  will be assessed for filter retrieval by Interventional Radiology in approximately 8-12 weeks. Further recommendations regarding filter retrieval, continued surveillance or declaration of device permanence, will be made at that time. Electronically Signed   By: Sandi Mariscal M.D.   On: 04/29/2019 17:51   DG CHEST PORT 1 VIEW  Result Date: 04/24/2019 CLINICAL DATA:  68 year old with COVID-19 pneumonia, now with worsening hypoxia. EXAM: PORTABLE CHEST 1 VIEW COMPARISON:  04/21/2019. FINDINGS: Cardiac silhouette upper normal in size to mildly enlarged, unchanged. Improved aeration in the lung bases, with residual mild streaky opacities at the LEFT base. No new pulmonary parenchymal  abnormalities. Normal pulmonary vascularity. No visible pleural effusions. IMPRESSION: 1. Improved aeration in the lung bases, with residual mild streaky atelectasis and/or bronchopneumonia at the LEFT base. 2. No new abnormalities. Electronically Signed   By: Evangeline Dakin M.D.   On: 04/24/2019 17:07   DG Chest Port 1 View  Result Date: 04/21/2019 CLINICAL DATA:  69 year old female with shortness of breath. EXAM: PORTABLE CHEST 1 VIEW COMPARISON:  Chest radiograph report dated 02/28/2001. The images are not available for direct comparison. FINDINGS: Minimal bibasilar, left greater than right, densities likely atelectatic changes. Atypical infiltrate is less likely but not excluded. Clinical correlation is recommended. There is no focal consolidation, pleural effusion, pneumothorax. The cardiac silhouette is within normal limits. No acute osseous pathology. IMPRESSION: Minimal bibasilar atelectasis.  No focal consolidation. Electronically Signed   By: Anner Crete M.D.   On: 04/21/2019 17:45   VAS [place] LOWER EXTREMITY VENOUS (DVT)  Result Date: 04/28/2019  Lower Venous Study Indications: Pulmonary embolism. Other Indications: Covid positive. Risk Factors: Large pelvic hematoma is noted which appears to originate from the. Limitations: Body habitus. Comparison Study: No prior study on file for comparison. Performing Technologist: Sharion Dove RVS  Examination Guidelines: A complete evaluation includes B-mode imaging, spectral Doppler, color Doppler, and power Doppler as needed of all accessible portions of each vessel. Bilateral testing is considered an integral part of a complete examination. Limited examinations for reoccurring indications may be performed as noted.  +---------+---------------+---------+-----------+----------+--------------+ RIGHT    CompressibilityPhasicitySpontaneityPropertiesThrombus Aging +---------+---------------+---------+-----------+----------+--------------+ CFV       Full           Yes      Yes                                 +---------+---------------+---------+-----------+----------+--------------+ SFJ      Full                                                        +---------+---------------+---------+-----------+----------+--------------+ FV Prox  Full                                                        +---------+---------------+---------+-----------+----------+--------------+ FV Mid   Full                                                        +---------+---------------+---------+-----------+----------+--------------+  FV DistalFull                                                        +---------+---------------+---------+-----------+----------+--------------+ PFV      Full                                                        +---------+---------------+---------+-----------+----------+--------------+ POP      Full           Yes      Yes                                 +---------+---------------+---------+-----------+----------+--------------+ PTV      Full                                                        +---------+---------------+---------+-----------+----------+--------------+ PERO     Full                                                        +---------+---------------+---------+-----------+----------+--------------+   +---------+---------------+---------+-----------+----------+-----------------+ LEFT     CompressibilityPhasicitySpontaneityPropertiesThrombus Aging    +---------+---------------+---------+-----------+----------+-----------------+ CFV      Full           Yes      Yes                                    +---------+---------------+---------+-----------+----------+-----------------+ SFJ      Full                                                           +---------+---------------+---------+-----------+----------+-----------------+ FV Prox  Full                                                            +---------+---------------+---------+-----------+----------+-----------------+ FV Mid   Full                                                           +---------+---------------+---------+-----------+----------+-----------------+ FV DistalFull                                                           +---------+---------------+---------+-----------+----------+-----------------+  PFV      Full                                                           +---------+---------------+---------+-----------+----------+-----------------+ POP      Full           No       No                                     +---------+---------------+---------+-----------+----------+-----------------+ PTV      Full                                         Age Indeterminate +---------+---------------+---------+-----------+----------+-----------------+ PERO     Full                                         Age Indeterminate +---------+---------------+---------+-----------+----------+-----------------+     Summary: Right: There is no evidence of deep vein thrombosis in the lower extremity. Left: Findings consistent with age indeterminate deep vein thrombosis involving the left posterior tibial veins, and left peroneal veins.  *See table(s) above for measurements and observations. Electronically signed by Servando Snare MD on 04/28/2019 at 10:55:03 AM.    Final    ECHOCARDIOGRAM LIMITED  Result Date: 04/25/2019   ECHOCARDIOGRAM LIMITED REPORT   Patient Name:   YULISA CHIRICO Date of Exam: 04/25/2019 Medical Rec #:  [id number]         Height:       63.0 in Accession #:    [id number]        Weight:       246.5 lb Date of Birth:  1950/10/17         BSA:          2.11 m Patient Age:    12 years          BP:           126/70 mmHg Patient Gender: F                 HR:           79 bpm. Exam Location:  Inpatient  Procedure: Limited Echo, Limited Color Doppler and  Cardiac Doppler STAT ECHO Indications:    pulmonary embolus 415.19  History:        Patient has no prior history of Echocardiogram examinations.  Sonographer:    Johny Chess Referring Phys: [id number] [place]  1. Left ventricular ejection fraction, by visual estimation, is 50 to 55%. The left ventricle has normal function. There is no left ventricular hypertrophy.  2. Left ventricular diastolic parameters are consistent with Grade I diastolic dysfunction (impaired relaxation).  3. The left ventricle has no regional wall motion abnormalities.  4. Global right ventricle has normal systolic function.The right ventricular size is normal.  5. Left atrial size was normal.  6. Right atrial size was normal.  7. The mitral valve is normal in structure. No evidence of mitral valve regurgitation. No evidence of mitral stenosis.  8.  The tricuspid valve is normal in structure. Tricuspid valve regurgitation is trivial.  9. The aortic valve is tricuspid. Aortic valve regurgitation is not visualized. Mild aortic valve sclerosis without stenosis. 10. The pulmonic valve was normal in structure. Pulmonic valve regurgitation is not visualized. 11. Mildly elevated pulmonary artery systolic pressure. 12. The inferior vena cava is normal in size with greater than 50% respiratory variability, suggesting right atrial pressure of 3 mmHg. 13. Low normal LV systolic function; grade 1 diastolic dysfunction; moderate RV dysfunction. FINDINGS  Left Ventricle: Left ventricular ejection fraction, by visual estimation, is 50 to 55%. The left ventricle has normal function. The left ventricle has no regional wall motion abnormalities. There is no left ventricular hypertrophy. Left ventricular diastolic parameters are consistent with Grade I diastolic dysfunction (impaired relaxation). Normal left atrial pressure. Right Ventricle: The right ventricular size is normal.Global RV systolic function is has normal systolic function. The  tricuspid regurgitant velocity is 2.82 m/s, and with an assumed right atrial pressure of 3 mmHg, the estimated right ventricular systolic pressure is mildly elevated at 34.8 mmHg. Left Atrium: Left atrial size was normal in size. Right Atrium: Right atrial size was normal in size Pericardium: There is no evidence of pericardial effusion. Mitral Valve: The mitral valve is normal in structure. No evidence of mitral valve stenosis by observation. MV Area by PHT, 4.31 cm. MV PHT, 51.04 msec. No evidence of mitral valve regurgitation. Tricuspid Valve: The tricuspid valve is normal in structure. Tricuspid valve regurgitation is trivial. Aortic Valve: The aortic valve is tricuspid. Aortic valve regurgitation is not visualized. Mild aortic valve sclerosis is present, with no evidence of aortic valve stenosis. Pulmonic Valve: The pulmonic valve was normal in structure. Pulmonic valve regurgitation is not visualized. Aorta: The aortic root is normal in size and structure. Venous: The inferior vena cava is normal in size with greater than 50% respiratory variability, suggesting right atrial pressure of 3 mmHg.  LEFT VENTRICLE          Normals PLAX 2D LVIDd:         4.10 cm  3.6 cm   Diastology                 Normals LVIDs:         3.20 cm  1.7 cm   LV e' lateral:   8.27 cm/s 6.42 cm/s LV PW:         0.90 cm  1.4 cm   LV E/e' lateral: 8.4       15.4 LV IVS:        1.00 cm  1.3 cm   LV e' medial:    6.85 cm/s 6.96 cm/s LVOT diam:     1.90 cm  2.0 cm   LV E/e' medial:  10.2      6.96 LV SV:         33 ml    79 ml LV SV Index:   14.51    45 ml/m2 LVOT Area:     2.84 cm 3.14 cm2  RIGHT VENTRICLE RV S prime:     15.60 cm/s TAPSE (M-mode): 2.1 cm LEFT ATRIUM         Index LA diam:    3.20 cm 1.51 cm/m  AORTIC VALVE             Normals LVOT Vmax:   78.30 cm/s LVOT Vmean:  46.200 cm/s 75 cm/s LVOT VTI:    0.134 m     25.3 cm  AORTA                 Normals Ao Root diam: 3.10 cm 31 mm MITRAL VALVE              Normals   TRICUSPID  VALVE             Normals MV Area (PHT): 4.31 cm             TR Peak grad:   31.8 mmHg MV PHT:        51.04 msec 55 ms     TR Vmax:        282.00 cm/s 288 cm/s MV Decel Time: 176 msec   187 ms MV E velocity: 69.80 cm/s 103 cm/s  SHUNTS MV A velocity: 78.80 cm/s 70.3 cm/s Systemic VTI:  0.13 m MV E/A ratio:  0.89       1.5       Systemic Diam: 1.90 cm  Kirk Ruths MD Electronically signed by Kirk Ruths MD Signature Date/Time: 04/25/2019/3:06:36 PM    Final    CT Angio Abd/Pel w/ and/or w/o  Result Date: 04/27/2019 CLINICAL DATA:  Acute generalized abdominal pain. EXAM: CTA ABDOMEN AND PELVIS WITHOUT AND WITH CONTRAST TECHNIQUE: Multidetector CT imaging of the abdomen and pelvis was performed using the standard protocol during bolus administration of intravenous contrast. Multiplanar reconstructed images and MIPs were obtained and reviewed to evaluate the vascular anatomy. CONTRAST:  150m OMNIPAQUE IOHEXOL 350 MG/ML SOLN COMPARISON:  None. FINDINGS: VASCULAR Aorta: Normal caliber aorta without aneurysm, dissection, vasculitis or significant stenosis. Celiac: Patent without evidence of aneurysm, dissection, vasculitis or significant stenosis. SMA: Patent without evidence of aneurysm, dissection, vasculitis or significant stenosis. Renals: Both renal arteries are patent without evidence of aneurysm, dissection, vasculitis, fibromuscular dysplasia or significant stenosis. IMA: Patent without evidence of aneurysm, dissection, vasculitis or significant stenosis. Inflow: Patent without evidence of aneurysm, dissection, vasculitis or significant stenosis. Proximal Outflow: Bilateral common femoral and visualized portions of the superficial and profunda femoral arteries are patent without evidence of aneurysm, dissection, vasculitis or significant stenosis. Veins: No obvious venous abnormality within the limitations of this arterial phase study. Review of the MIP images confirms the above findings. NON-VASCULAR  Lower chest: No acute abnormality. Hepatobiliary: Cholelithiasis is noted. No biliary dilatation is noted. Minimally nodular hepatic margins are noted suggesting possible early hepatic cirrhosis. Pancreas: Unremarkable. No pancreatic ductal dilatation or surrounding inflammatory changes. Spleen: Normal in size without focal abnormality. Adrenals/Urinary Tract: Adrenal glands and kidneys appear normal. No hydronephrosis is noted. Urinary bladder appears to be compressed by large pelvic hematoma. Stomach/Bowel: The stomach appears normal. There is no evidence of bowel obstruction or inflammation. The appendix is not visualized. Lymphatic: No adenopathy is noted. Reproductive: Status post hysterectomy. No adnexal masses. Other: There is a large complex lobulated density in the left in anterior portion of the pelvis which appears to arise from inferior portion of left rectal sheath, most consistent with hemorrhage. There is noted some hyperdensity along the medial portion of the left inferior rectus sheath which may represent contrast extravasation and possible active hemorrhage. Musculoskeletal: No acute or significant osseous findings. IMPRESSION: VASCULAR Large pelvic hematoma is noted which appears to originate from the inferior portion of the left rectus muscle, most consistent with spontaneous hemorrhage, potentially related to anticoagulation. This causes displacement and compression of the urinary bladder. There is seen some irregular high density along the medial portion of the left inferior rectus muscle which may represent contrast extravasation  and possibly active hemorrhage. Critical Value/emergent results were called by telephone at the time of interpretation on 04/27/2019 at 5:05 pm to [place] , who verbally acknowledged these results. Mild cholelithiasis is noted. Minimally nodular hepatic margins are noted suggesting possible hepatic cirrhosis. Electronically Signed   By: Marijo Conception  M.D.   On: 04/27/2019 17:14     Subjective: Alert, denies dyspnea.   Discharge Exam: Vitals:   05/02/19 1000 05/02/19 1100  BP: 128/65 128/62  Pulse: 87 94  Resp: (!) 26 (!) 24  Temp:    SpO2: 95% 96%     General: Pt is alert, awake, not in acute distress Cardiovascular: RRR, S1/S2 +, no rubs, no gallops Respiratory: CTA bilaterally, no wheezing, no rhonchi Abdominal: Soft, NT, ND, bowel sounds + Extremities: no edema, no cyanosis    The results of significant diagnostics from this hospitalization (including imaging, microbiology, ancillary and laboratory) are listed below for reference.     Microbiology: Recent Results (from the past 240 hour(s))  MRSA PCR Screening     Status: None   Collection Time: 04/23/19 12:35 PM   Specimen: Nasal Mucosa; Nasopharyngeal  Result Value Ref Range Status   MRSA by PCR NEGATIVE NEGATIVE Final    Comment:        The GeneXpert MRSA Assay (FDA approved for NASAL specimens only), is one component of a comprehensive MRSA colonization surveillance program. It is not intended to diagnose MRSA infection nor to guide or monitor treatment for MRSA infections. Performed at [facility], [place] [address], [place], [place] [address]      Labs: BNP (last 3 results) Recent Labs    04/24/19 0500  BNP 329.1*   Basic Metabolic Panel: Recent Labs  Lab 04/28/19 0445 04/29/19 0339 04/30/19 0320 05/01/19 0500 05/02/19 0648  NA 140 137 138 140 139  K 4.6 4.3 4.1 4.0 3.7  CL 108 105 107 106 104  CO2 20* 21* 21* 24 24  GLUCOSE 181* 251* 188* 156* 171*  BUN 20 12 18 15 12   CREATININE 1.01* 0.71 0.81 0.77 0.79  CALCIUM 8.8* 9.5 9.5 9.3 9.2  MG  --   --   --  2.0  --   PHOS  --   --   --  2.9  --    Liver Function Tests: Recent Labs  Lab 04/26/19 0500 04/27/19 0419 04/28/19 0445 04/29/19 0339 04/30/19 0320  AST 35 23 37 23 24  ALT 19 17 22 21 19   ALKPHOS 86 79 47 34* 42  BILITOT 0.6 0.9 1.5* 1.7* 2.5*  PROT 6.7 6.3*  6.5 6.4* 6.4*  ALBUMIN 2.6* 2.6* 4.2 4.9 4.8   No results for input(s): LIPASE, AMYLASE in the last 168 hours. No results for input(s): AMMONIA in the last 168 hours. CBC: Recent Labs  Lab 04/27/19 0419 04/28/19 0652 04/29/19 0339 04/29/19 1620 04/29/19 2319 04/30/19 0320 05/01/19 0500 05/02/19 0648  WBC 8.2 12.3* 6.6  --  9.1 9.2 9.8 10.1  NEUTROABS 6.3 7.9* 4.9  --  5.4 5.6  --   --   HGB 11.4* 6.7* 7.5* 8.6* 9.8* 9.8* 10.6* 10.9*  HCT 36.7 22.0* 23.8* 26.7* 29.6* 29.5* 33.6* 34.6*  MCV 77.3* 82.1 82.1  --  81.1 82.2 85.1 85.6  PLT 154 149* 112*  --  99* 97* 120* 132*   Cardiac Enzymes: No results for input(s): CKTOTAL, CKMB, CKMBINDEX, TROPONINI in the last 168 hours. BNP: Invalid input(s): POCBNP CBG: Recent Labs  Lab 05/01/19 0752 05/01/19 1107 05/01/19 1736 05/01/19 2209 05/02/19 0827  GLUCAP 163* 153* 124* 148* 170*   D-Dimer Recent Labs    05/01/19 0500 05/02/19 0648  DDIMER 15.85* 12.11*   Hgb A1c No results for input(s): HGBA1C in the last 72 hours. Lipid Profile No results for input(s): CHOL, HDL, LDLCALC, TRIG, CHOLHDL, LDLDIRECT in the last 72 hours. Thyroid function studies No results for input(s): TSH, T4TOTAL, T3FREE, THYROIDAB in the last 72 hours.  Invalid input(s): FREET3 Anemia work up Recent Labs    05/01/19 0500 05/02/19 0648  FERRITIN 59 65   Urinalysis    Component Value Date/Time   COLORURINE YELLOW 04/28/2019 1800   APPEARANCEUR HAZY (A) 04/28/2019 1800   LABSPEC 1.025 04/28/2019 1800   PHURINE 6.5 04/28/2019 1800   GLUCOSEU NEGATIVE 04/28/2019 1800   HGBUR TRACE (A) 04/28/2019 1800   BILIRUBINUR NEGATIVE 04/28/2019 1800   KETONESUR 15 (A) 04/28/2019 1800   PROTEINUR NEGATIVE 04/28/2019 1800   NITRITE POSITIVE (A) 04/28/2019 1800   LEUKOCYTESUR NEGATIVE 04/28/2019 1800   Sepsis Labs Invalid input(s): PROCALCITONIN,  WBC,  LACTICIDVEN Microbiology Recent Results (from the past 240 hour(s))  MRSA PCR Screening      Status: None   Collection Time: 04/23/19 12:35 PM   Specimen: Nasal Mucosa; Nasopharyngeal  Result Value Ref Range Status   MRSA by PCR NEGATIVE NEGATIVE Final    Comment:        The GeneXpert MRSA Assay (FDA approved for NASAL specimens only), is one component of a comprehensive MRSA colonization surveillance program. It is not intended to diagnose MRSA infection nor to guide or monitor treatment for MRSA infections. Performed at [facility], [place] [address], [place], [place] [address]      Time coordinating discharge: 40 minutes  SIGNED:   Elmarie Shiley, MD  [facility]

## 2019-05-02 NOTE — TOC Transition Note (Signed)
Transition of Care [facility]) - CM/SW Discharge Note   Patient Details  Name: DASHLEY MONTS MRN: [id number] Date of Birth: 1951-01-12  [facility] CM/SW Contact:  Rae Mar, RN Phone Number: 05/02/2019, 12:06 PM   Clinical Narrative:     CM noted pt was ready for transition home today.  Pt declined HH PT/OT per RN. RN also reports per pt, home O2 was delivered to her home several days ago and she is to wear it when she is walking and at night.  Eliquis card previously provided.  No further TOC needs noted at this time.  Final next level of care: Home/Self Care Barriers to Discharge: Barriers Resolved   Patient Goals and CMS Choice Patient states their goals for this hospitalization and ongoing recovery are:: Pt states she is ready to get home and feel better CMS Medicare.gov Compare Post Acute Care list provided to:: Patient Choice offered to / list presented to : Patient  Discharge Placement                       Discharge Plan and Services     Post Acute Care Choice: Durable Medical Equipment          DME Arranged: Oxygen DME Agency: [place] Date DME Agency Contacted: 04/26/19 Time DME Agency Contacted: 1300 Representative spoke with at DME Agency: Magda Paganini            Social Determinants of Health ([place]) Interventions     Readmission Risk Interventions No flowsheet data found.

## 2019-05-02 NOTE — Progress Notes (Signed)
  Chart check.  S/P IVC filter secondary to hematoma on anticoagulation.  Hgb Stable.  Will sign off.  Jaimy Kliethermes S Izaak Sahr PA-C 05/02/2019 11:35 AM

## 2019-05-02 NOTE — Progress Notes (Signed)
Patient was instructed on insulin pen use including proper site choice and hygiene, expelling air bubble, selecting her appropriate dose, and performing injection. This was reverse demonstrated by the patient. These instructions were provided to the patient upon discharge.  Myrle Sheng BSN RN 70M/108M MICU

## 2019-05-10 ENCOUNTER — Encounter (HOSPITAL_COMMUNITY): Payer: Self-pay

## 2019-05-10 ENCOUNTER — Other Ambulatory Visit: Payer: Self-pay

## 2019-05-10 ENCOUNTER — Inpatient Hospital Stay (HOSPITAL_COMMUNITY)
Admission: EM | Admit: 2019-05-10 | Discharge: 2019-05-21 | DRG: 853 | Disposition: A | Discharge status: Home Health | Payer: PPO | Attending: Internal Medicine | Admitting: Internal Medicine

## 2019-05-10 DIAGNOSIS — Z6839 Body mass index (BMI) 39.0-39.9, adult: Secondary | ICD-10-CM

## 2019-05-10 DIAGNOSIS — I82403 Acute embolism and thrombosis of unspecified deep veins of lower extremity, bilateral: Secondary | ICD-10-CM

## 2019-05-10 DIAGNOSIS — F329 Major depressive disorder, single episode, unspecified: Secondary | ICD-10-CM | POA: Diagnosis present

## 2019-05-10 DIAGNOSIS — Z95828 Presence of other vascular implants and grafts: Secondary | ICD-10-CM

## 2019-05-10 DIAGNOSIS — I82463 Acute embolism and thrombosis of calf muscular vein, bilateral: Secondary | ICD-10-CM

## 2019-05-10 DIAGNOSIS — E872 Acidosis, unspecified: Secondary | ICD-10-CM

## 2019-05-10 DIAGNOSIS — D696 Thrombocytopenia, unspecified: Secondary | ICD-10-CM | POA: Diagnosis present

## 2019-05-10 DIAGNOSIS — J9601 Acute respiratory failure with hypoxia: Secondary | ICD-10-CM | POA: Diagnosis not present

## 2019-05-10 DIAGNOSIS — Z96653 Presence of artificial knee joint, bilateral: Secondary | ICD-10-CM | POA: Diagnosis present

## 2019-05-10 DIAGNOSIS — Z96612 Presence of left artificial shoulder joint: Secondary | ICD-10-CM | POA: Diagnosis present

## 2019-05-10 DIAGNOSIS — I82421 Acute embolism and thrombosis of right iliac vein: Secondary | ICD-10-CM | POA: Diagnosis not present

## 2019-05-10 DIAGNOSIS — N179 Acute kidney failure, unspecified: Secondary | ICD-10-CM | POA: Diagnosis present

## 2019-05-10 DIAGNOSIS — E1165 Type 2 diabetes mellitus with hyperglycemia: Secondary | ICD-10-CM | POA: Diagnosis present

## 2019-05-10 DIAGNOSIS — Z96641 Presence of right artificial hip joint: Secondary | ICD-10-CM | POA: Diagnosis present

## 2019-05-10 DIAGNOSIS — I82220 Acute embolism and thrombosis of inferior vena cava: Secondary | ICD-10-CM | POA: Diagnosis present

## 2019-05-10 DIAGNOSIS — J45909 Unspecified asthma, uncomplicated: Secondary | ICD-10-CM

## 2019-05-10 DIAGNOSIS — J189 Pneumonia, unspecified organism: Secondary | ICD-10-CM | POA: Diagnosis present

## 2019-05-10 DIAGNOSIS — U071 COVID-19: Secondary | ICD-10-CM | POA: Diagnosis not present

## 2019-05-10 DIAGNOSIS — I2699 Other pulmonary embolism without acute cor pulmonale: Secondary | ICD-10-CM | POA: Diagnosis present

## 2019-05-10 DIAGNOSIS — E875 Hyperkalemia: Secondary | ICD-10-CM | POA: Diagnosis not present

## 2019-05-10 DIAGNOSIS — R2241 Localized swelling, mass and lump, right lower limb: Secondary | ICD-10-CM | POA: Diagnosis not present

## 2019-05-10 DIAGNOSIS — I82432 Acute embolism and thrombosis of left popliteal vein: Secondary | ICD-10-CM | POA: Diagnosis not present

## 2019-05-10 DIAGNOSIS — Y95 Nosocomial condition: Secondary | ICD-10-CM | POA: Diagnosis present

## 2019-05-10 DIAGNOSIS — Z8616 Personal history of COVID-19: Secondary | ICD-10-CM | POA: Diagnosis not present

## 2019-05-10 DIAGNOSIS — Z743 Need for continuous supervision: Secondary | ICD-10-CM | POA: Diagnosis not present

## 2019-05-10 DIAGNOSIS — K5900 Constipation, unspecified: Secondary | ICD-10-CM | POA: Diagnosis present

## 2019-05-10 DIAGNOSIS — Z794 Long term (current) use of insulin: Secondary | ICD-10-CM

## 2019-05-10 DIAGNOSIS — Z79899 Other long term (current) drug therapy: Secondary | ICD-10-CM

## 2019-05-10 DIAGNOSIS — I82409 Acute embolism and thrombosis of unspecified deep veins of unspecified lower extremity: Secondary | ICD-10-CM

## 2019-05-10 DIAGNOSIS — E871 Hypo-osmolality and hyponatremia: Secondary | ICD-10-CM | POA: Diagnosis present

## 2019-05-10 DIAGNOSIS — R069 Unspecified abnormalities of breathing: Secondary | ICD-10-CM | POA: Diagnosis not present

## 2019-05-10 DIAGNOSIS — I82422 Acute embolism and thrombosis of left iliac vein: Secondary | ICD-10-CM | POA: Diagnosis not present

## 2019-05-10 DIAGNOSIS — R0602 Shortness of breath: Secondary | ICD-10-CM | POA: Diagnosis not present

## 2019-05-10 DIAGNOSIS — I82423 Acute embolism and thrombosis of iliac vein, bilateral: Secondary | ICD-10-CM | POA: Diagnosis present

## 2019-05-10 DIAGNOSIS — R109 Unspecified abdominal pain: Secondary | ICD-10-CM | POA: Diagnosis not present

## 2019-05-10 DIAGNOSIS — D649 Anemia, unspecified: Secondary | ICD-10-CM | POA: Diagnosis present

## 2019-05-10 DIAGNOSIS — Z90710 Acquired absence of both cervix and uterus: Secondary | ICD-10-CM

## 2019-05-10 DIAGNOSIS — I82433 Acute embolism and thrombosis of popliteal vein, bilateral: Secondary | ICD-10-CM | POA: Diagnosis present

## 2019-05-10 DIAGNOSIS — R000 Tachycardia, unspecified: Secondary | ICD-10-CM | POA: Diagnosis not present

## 2019-05-10 DIAGNOSIS — I82413 Acute embolism and thrombosis of femoral vein, bilateral: Secondary | ICD-10-CM | POA: Diagnosis present

## 2019-05-10 DIAGNOSIS — R6520 Severe sepsis without septic shock: Secondary | ICD-10-CM | POA: Diagnosis present

## 2019-05-10 DIAGNOSIS — R0689 Other abnormalities of breathing: Secondary | ICD-10-CM | POA: Diagnosis not present

## 2019-05-10 DIAGNOSIS — R6510 Systemic inflammatory response syndrome (SIRS) of non-infectious origin without acute organ dysfunction: Secondary | ICD-10-CM

## 2019-05-10 DIAGNOSIS — E860 Dehydration: Secondary | ICD-10-CM | POA: Diagnosis not present

## 2019-05-10 DIAGNOSIS — F419 Anxiety disorder, unspecified: Secondary | ICD-10-CM | POA: Diagnosis present

## 2019-05-10 DIAGNOSIS — E119 Type 2 diabetes mellitus without complications: Secondary | ICD-10-CM | POA: Diagnosis not present

## 2019-05-10 DIAGNOSIS — E876 Hypokalemia: Secondary | ICD-10-CM | POA: Diagnosis not present

## 2019-05-10 DIAGNOSIS — J181 Lobar pneumonia, unspecified organism: Secondary | ICD-10-CM

## 2019-05-10 DIAGNOSIS — A419 Sepsis, unspecified organism: Principal | ICD-10-CM | POA: Diagnosis present

## 2019-05-10 DIAGNOSIS — I82402 Acute embolism and thrombosis of unspecified deep veins of left lower extremity: Secondary | ICD-10-CM | POA: Diagnosis not present

## 2019-05-10 DIAGNOSIS — F32A Depression, unspecified: Secondary | ICD-10-CM

## 2019-05-10 DIAGNOSIS — K219 Gastro-esophageal reflux disease without esophagitis: Secondary | ICD-10-CM | POA: Diagnosis present

## 2019-05-10 DIAGNOSIS — E669 Obesity, unspecified: Secondary | ICD-10-CM | POA: Diagnosis present

## 2019-05-10 DIAGNOSIS — R2242 Localized swelling, mass and lump, left lower limb: Secondary | ICD-10-CM | POA: Diagnosis not present

## 2019-05-10 DIAGNOSIS — Z981 Arthrodesis status: Secondary | ICD-10-CM

## 2019-05-10 DIAGNOSIS — E1136 Type 2 diabetes mellitus with diabetic cataract: Secondary | ICD-10-CM | POA: Diagnosis present

## 2019-05-10 DIAGNOSIS — M7989 Other specified soft tissue disorders: Secondary | ICD-10-CM

## 2019-05-10 DIAGNOSIS — I82431 Acute embolism and thrombosis of right popliteal vein: Secondary | ICD-10-CM | POA: Diagnosis not present

## 2019-05-10 NOTE — ED Triage Notes (Signed)
Pt diagnosed with covid a month ago, states since being home she has just not felt like eating or drinking and feels very dry.  Pt has multiple complaints including some sob, back pain, left leg numbness x 3 days.

## 2019-05-10 NOTE — ED Provider Notes (Signed)
[facility] EMERGENCY DEPARTMENT Provider Note   CSN: [id number] Arrival date & time: 05/10/19  2339     History Chief Complaint  Patient presents with  . Dehydration    Regina Munoz is a 68 y.o. female.  HPI    Patient presents with less than 1 week after being discharged following admission for Covid. She notes that since discharge she has had persistent nausea, anorexia, progressive generalized weakness and cough. No reported new fever, no syncope, no new focal pain including chest pain.  She does have some pain in the left axilla which has been present for an unspecified time. It is unclear if she is taking any medication for relief, but with progression of symptoms over the past 2 days, family members were concerned, calling EMS. History is obtained by EMS providers and the patient herself.  When EMS providers note that the patient was tachycardic on arrival with a heart rate in the 130s.   Past Medical History:  Diagnosis Date  . Anxiety   . Arthritis   . Asthma   . Complication of anesthesia   . COVID-19   . Diabetes mellitus without complication (HCC)   . Dysrhythmia    palpitations ->20 yrs stress test neg nothing since  . GERD (gastroesophageal reflux disease)   . Headache   . PONV (postoperative nausea and vomiting)     Patient Active Problem List   Diagnosis Date Noted  . Hypoxia 05/02/2019  . Bilateral pulmonary embolism (HCC)   . Pneumonia due to COVID-19 virus 04/21/2019  . Neck pain 02/17/2016    Past Surgical History:  Procedure Laterality Date  . ABDOMINAL HYSTERECTOMY    . ANTERIOR CERVICAL DECOMP/DISCECTOMY FUSION N/A 02/17/2016   Procedure: ANTERIOR CERVICAL DECOMPRESSION/DISCECTOMY FUSION 1 LEVEL C4-5;  Surgeon: Dahari Brooks, MD;  Location: [facility] OR;  Service: Orthopedics;  Laterality: N/A;  . EYE SURGERY Bilateral    cataracts  . HIP CLOSED REDUCTION Right 12/24/2012   Procedure: CLOSED REDUCTION HIP;  Surgeon: Mark Leonard Dumonski,  MD;  Location: [facility] ORS;  Service: Orthopedics;  Laterality: Right;  . IR IVC FILTER PLMT / S&I /IMG GUID/MOD SED  04/29/2019  . JOINT REPLACEMENT  2002   right and left knees  . SHOULDER ARTHROSCOPY Left   . TOTAL HIP ARTHROPLASTY Right    2002     OB History   No obstetric history on file.     No family history on file.  Social History   Tobacco Use  . Smoking status: Never Smoker  . Smokeless tobacco: Never Used  Substance Use Topics  . Alcohol use: No  . Drug use: No    Home Medications Prior to Admission medications   Medication Sig Start Date End Date Taking? Authorizing Provider  acetaminophen (TYLENOL) 500 MG tablet Take 1,000 mg by mouth every 6 (six) hours as needed for mild pain or moderate pain.     [provider]  albuterol (VENTOLIN HFA) 108 (90 Base) MCG/ACT inhaler Inhale 2 puffs into the lungs every 4 (four) hours as needed for wheezing or shortness of breath. 05/02/19   Regalado, Belkys A, MD  ascorbic acid (VITAMIN C) 500 MG tablet Take 1 tablet (500 mg total) by mouth daily. 05/02/19   Regalado, Belkys A, MD  blood glucose meter kit and supplies KIT Dispense based on patient and insurance preference. Use up to four times daily as directed. (FOR ICD-9 250.00, 250.01). 05/02/19   Regalado, Belkys A, MD  buPROPion (ZYBAN)   150 MG 12 hr tablet Take 75 mg by mouth every morning.  03/28/19   [provider]  cetirizine (ZYRTEC) 10 MG tablet Take 10 mg by mouth daily. 02/13/19   [provider]  cholecalciferol (VITAMIN D) 1000 UNITS tablet Take 1,000 Units by mouth daily.    [provider]  dextromethorphan-guaiFENesin (MUCINEX DM) 30-600 MG 12hr tablet Take 1 tablet by mouth 2 (two) times daily. 05/02/19   Regalado, Belkys A, MD  diclofenac Sodium (VOLTAREN) 1 % GEL Apply 2 g topically daily as needed (for pain).     [provider]  Insulin Glargine (LANTUS) 100 UNIT/ML Solostar Pen Inject 10 Units into the skin daily.  05/02/19   Regalado, Belkys A, MD  Insulin Pen Needle (PEN NEEDLES 3/16") 31G X 5 MM MISC 1 application by Does not apply route daily. 05/02/19   Regalado, Belkys A, MD  metFORMIN (GLUCOPHAGE) 500 MG tablet Take 500 mg by mouth 2 (two) times daily with a meal.     [provider]  omeprazole (PRILOSEC) 20 MG capsule Take 20 mg by mouth daily.    [provider]  polyethylene glycol (MIRALAX / GLYCOLAX) 17 g packet Take 17 g by mouth daily as needed for mild constipation. 05/02/19   Regalado, Belkys A, MD  tiotropium (SPIRIVA HANDIHALER) 18 MCG inhalation capsule Place 1 capsule (18 mcg total) into inhaler and inhale 2 (two) times daily. 05/02/19   Regalado, Belkys A, MD  zinc sulfate 220 (50 Zn) MG capsule Take 1 capsule (220 mg total) by mouth daily. 05/02/19   Regalado, Belkys A, MD    Allergies    Patient has no known allergies.  Review of Systems   Review of Systems  Constitutional:       Per HPI, otherwise negative  HENT:       Per HPI, otherwise negative  Respiratory:       Per HPI, otherwise negative  Cardiovascular:       Per HPI, otherwise negative  Gastrointestinal: Positive for nausea. Negative for vomiting.  Endocrine:       Negative aside from HPI  Genitourinary:       Neg aside from HPI   Musculoskeletal:       Per HPI, otherwise negative  Skin: Negative.   Neurological: Positive for weakness. Negative for syncope.    Physical Exam Updated Vital Signs BP (!) 123/55   Pulse (!) 130   Resp (!) 24   Ht 5' 5" (1.651 m)   Wt 106.6 kg   SpO2 92%   BMI 39.11 kg/m   Physical Exam Vitals and nursing note reviewed.  Constitutional:      Appearance: She is well-developed. She is ill-appearing.  HENT:     Head: Normocephalic and atraumatic.  Eyes:     Conjunctiva/sclera: Conjunctivae normal.  Cardiovascular:     Rate and Rhythm: Regular rhythm. Tachycardia present.  Pulmonary:     Effort: Pulmonary effort is normal. No respiratory distress.       Breath sounds: Normal breath sounds. No stridor.  Abdominal:     General: There is no distension.  Musculoskeletal:     Comments: Left lower extremity patient describes painful, without deformity, no gross skin color changes, though there is slight asymmetry, with enlargement of the left leg compared to right.  Skin:    General: Skin is warm and dry.  Neurological:     Mental Status: She is alert and oriented to person, place, and time.       Cranial Nerves: No cranial nerve deficit.     ED Results / Procedures / Treatments   Labs (all labs ordered are listed, but only abnormal results are displayed) Labs Reviewed  COMPREHENSIVE METABOLIC PANEL - Abnormal; Notable for the following components:      Result Value   Sodium 132 (*)    CO2 19 (*)    Glucose, Bld 235 (*)    Creatinine, Ser 1.21 (*)    Total Protein 8.5 (*)    Total Bilirubin 2.3 (*)    GFR calc non Af Amer 46 (*)    GFR calc Af Amer 54 (*)    All other components within normal limits  CBC WITH DIFFERENTIAL/PLATELET - Abnormal; Notable for the following components:   WBC 16.1 (*)    Hemoglobin 11.7 (*)    RDW 21.1 (*)    Neutro Abs 12.3 (*)    Monocytes Absolute 1.2 (*)    Abs Immature Granulocytes 0.60 (*)    All other components within normal limits  LACTIC ACID, PLASMA - Abnormal; Notable for the following components:   Lactic Acid, Venous 4.0 (*)    All other components within normal limits  BRAIN NATRIURETIC PEPTIDE  URINALYSIS, ROUTINE W REFLEX MICROSCOPIC  LACTIC ACID, PLASMA     Radiology Left lower lung base opacification, I have seen, reviewed, interpreted the images myself.   Procedures Procedures (including critical care time)  CRITICAL CARE Performed by: Robert Lockwood Total critical care time: 35 minutes Critical care time was exclusive of separately billable procedures and treating other patients. Critical care was necessary to treat or prevent imminent or life-threatening  deterioration. Critical care was time spent personally by me on the following activities: development of treatment plan with patient and/or surrogate as well as nursing, discussions with consultants, evaluation of patient's response to treatment, examination of patient, obtaining history from patient or surrogate, ordering and performing treatments and interventions, ordering and review of laboratory studies, ordering and review of radiographic studies, pulse oximetry and re-evaluation of patient's condition.   Medications Ordered in ED Medications  ceFEPIme (MAXIPIME) 1 g in sodium chloride 0.9 % 100 mL IVPB (has no administration in time range)  vancomycin (VANCOREADY) IVPB 2000 mg/400 mL (has no administration in time range)  vancomycin (VANCOCIN) IVPB 1000 mg/200 mL premix (has no administration in time range)    ED Course  I have reviewed the triage vital signs and the nursing notes.  Pertinent labs & imaging results that were available during my care of the patient were reviewed by me and considered in my medical decision making (see chart for details).    MDM Rules/Calculators/A&P                      Chart review notable for recent hospitalization for Covid, complicated by PE, DVT, and subsequently intra-abdominal hematoma while anticoagulated.  Patient designated is not a candidate for additional anticoagulation.  1:08 AM Patient continues to have tachycardia, though slightly less than on arrival. With leukocytosis, left shift, left lower lobe opacification, and the patient's tachycardia, there is concern for pneumonia, possibly Covid, possibly bacterial. Patient will start antibiotics, continue fluids. Patient also has lactic acidosis, and given her meeting several sirs criteria, concern for pneumonia as above, she will require admission. In addition to her pneumonia, SIRS, patient has left lower extremity pain, history of DVT, IVC has been placed, but this will require  ultrasound in the morning.  Final Clinical Impression(s) / ED Diagnoses   Final diagnoses:  SIRS (systemic inflammatory response syndrome) (HCC)  HCAP (healthcare-associated pneumonia)  AKI (acute kidney injury) (HCC)     Lockwood, Robert, MD 05/11/19 0111  

## 2019-05-11 ENCOUNTER — Emergency Department (HOSPITAL_COMMUNITY): Payer: PPO

## 2019-05-11 DIAGNOSIS — I82433 Acute embolism and thrombosis of popliteal vein, bilateral: Secondary | ICD-10-CM | POA: Diagnosis present

## 2019-05-11 DIAGNOSIS — J189 Pneumonia, unspecified organism: Secondary | ICD-10-CM | POA: Diagnosis present

## 2019-05-11 DIAGNOSIS — I82403 Acute embolism and thrombosis of unspecified deep veins of lower extremity, bilateral: Secondary | ICD-10-CM | POA: Diagnosis not present

## 2019-05-11 DIAGNOSIS — Z96641 Presence of right artificial hip joint: Secondary | ICD-10-CM | POA: Diagnosis present

## 2019-05-11 DIAGNOSIS — J9601 Acute respiratory failure with hypoxia: Secondary | ICD-10-CM | POA: Diagnosis not present

## 2019-05-11 DIAGNOSIS — Z96653 Presence of artificial knee joint, bilateral: Secondary | ICD-10-CM | POA: Diagnosis present

## 2019-05-11 DIAGNOSIS — F419 Anxiety disorder, unspecified: Secondary | ICD-10-CM | POA: Diagnosis present

## 2019-05-11 DIAGNOSIS — I82402 Acute embolism and thrombosis of unspecified deep veins of left lower extremity: Secondary | ICD-10-CM

## 2019-05-11 DIAGNOSIS — F329 Major depressive disorder, single episode, unspecified: Secondary | ICD-10-CM

## 2019-05-11 DIAGNOSIS — I82409 Acute embolism and thrombosis of unspecified deep veins of unspecified lower extremity: Secondary | ICD-10-CM

## 2019-05-11 DIAGNOSIS — E669 Obesity, unspecified: Secondary | ICD-10-CM | POA: Diagnosis present

## 2019-05-11 DIAGNOSIS — E872 Acidosis, unspecified: Secondary | ICD-10-CM

## 2019-05-11 DIAGNOSIS — R6510 Systemic inflammatory response syndrome (SIRS) of non-infectious origin without acute organ dysfunction: Secondary | ICD-10-CM | POA: Diagnosis not present

## 2019-05-11 DIAGNOSIS — E1165 Type 2 diabetes mellitus with hyperglycemia: Secondary | ICD-10-CM | POA: Diagnosis present

## 2019-05-11 DIAGNOSIS — E875 Hyperkalemia: Secondary | ICD-10-CM | POA: Diagnosis not present

## 2019-05-11 DIAGNOSIS — I2699 Other pulmonary embolism without acute cor pulmonale: Secondary | ICD-10-CM | POA: Diagnosis present

## 2019-05-11 DIAGNOSIS — Z8616 Personal history of COVID-19: Secondary | ICD-10-CM | POA: Diagnosis not present

## 2019-05-11 DIAGNOSIS — I82423 Acute embolism and thrombosis of iliac vein, bilateral: Secondary | ICD-10-CM | POA: Diagnosis present

## 2019-05-11 DIAGNOSIS — A419 Sepsis, unspecified organism: Secondary | ICD-10-CM | POA: Diagnosis present

## 2019-05-11 DIAGNOSIS — J45909 Unspecified asthma, uncomplicated: Secondary | ICD-10-CM | POA: Diagnosis present

## 2019-05-11 DIAGNOSIS — Z79899 Other long term (current) drug therapy: Secondary | ICD-10-CM | POA: Diagnosis not present

## 2019-05-11 DIAGNOSIS — Z794 Long term (current) use of insulin: Secondary | ICD-10-CM | POA: Diagnosis not present

## 2019-05-11 DIAGNOSIS — R6520 Severe sepsis without septic shock: Secondary | ICD-10-CM | POA: Diagnosis present

## 2019-05-11 DIAGNOSIS — E119 Type 2 diabetes mellitus without complications: Secondary | ICD-10-CM | POA: Diagnosis not present

## 2019-05-11 DIAGNOSIS — N179 Acute kidney failure, unspecified: Secondary | ICD-10-CM | POA: Diagnosis present

## 2019-05-11 DIAGNOSIS — J181 Lobar pneumonia, unspecified organism: Secondary | ICD-10-CM | POA: Diagnosis present

## 2019-05-11 DIAGNOSIS — I82413 Acute embolism and thrombosis of femoral vein, bilateral: Secondary | ICD-10-CM | POA: Diagnosis present

## 2019-05-11 DIAGNOSIS — Y95 Nosocomial condition: Secondary | ICD-10-CM | POA: Diagnosis present

## 2019-05-11 DIAGNOSIS — F32A Depression, unspecified: Secondary | ICD-10-CM

## 2019-05-11 DIAGNOSIS — Z96612 Presence of left artificial shoulder joint: Secondary | ICD-10-CM | POA: Diagnosis present

## 2019-05-11 DIAGNOSIS — E871 Hypo-osmolality and hyponatremia: Secondary | ICD-10-CM | POA: Diagnosis present

## 2019-05-11 DIAGNOSIS — Z981 Arthrodesis status: Secondary | ICD-10-CM | POA: Diagnosis not present

## 2019-05-11 DIAGNOSIS — I82220 Acute embolism and thrombosis of inferior vena cava: Secondary | ICD-10-CM | POA: Diagnosis present

## 2019-05-11 LAB — URINALYSIS, ROUTINE W REFLEX MICROSCOPIC
Glucose, UA: NEGATIVE mg/dL
Hgb urine dipstick: NEGATIVE
Ketones, ur: NEGATIVE mg/dL
Nitrite: NEGATIVE
Protein, ur: 30 mg/dL — AB
Specific Gravity, Urine: 1.033 — ABNORMAL HIGH (ref 1.005–1.030)
pH: 5 (ref 5.0–8.0)

## 2019-05-11 LAB — CBC WITH DIFFERENTIAL/PLATELET
Abs Immature Granulocytes: 0.48 10*3/uL — ABNORMAL HIGH (ref 0.00–0.07)
Abs Immature Granulocytes: 0.6 10*3/uL — ABNORMAL HIGH (ref 0.00–0.07)
Basophils Absolute: 0.1 10*3/uL (ref 0.0–0.1)
Basophils Absolute: 0.1 10*3/uL (ref 0.0–0.1)
Basophils Relative: 1 %
Basophils Relative: 1 %
Eosinophils Absolute: 0.1 10*3/uL (ref 0.0–0.5)
Eosinophils Absolute: 0.1 10*3/uL (ref 0.0–0.5)
Eosinophils Relative: 0 %
Eosinophils Relative: 1 %
HCT: 38.1 % (ref 36.0–46.0)
HCT: 38.4 % (ref 36.0–46.0)
Hemoglobin: 11.6 g/dL — ABNORMAL LOW (ref 12.0–15.0)
Hemoglobin: 11.7 g/dL — ABNORMAL LOW (ref 12.0–15.0)
Immature Granulocytes: 3 %
Immature Granulocytes: 4 %
Lymphocytes Relative: 11 %
Lymphocytes Relative: 15 %
Lymphs Abs: 1.8 10*3/uL (ref 0.7–4.0)
Lymphs Abs: 2.6 10*3/uL (ref 0.7–4.0)
MCH: 27.2 pg (ref 26.0–34.0)
MCH: 27.4 pg (ref 26.0–34.0)
MCHC: 30.2 g/dL (ref 30.0–36.0)
MCHC: 30.7 g/dL (ref 30.0–36.0)
MCV: 89.2 fL (ref 80.0–100.0)
MCV: 90.1 fL (ref 80.0–100.0)
Monocytes Absolute: 1.2 10*3/uL — ABNORMAL HIGH (ref 0.1–1.0)
Monocytes Absolute: 1.2 10*3/uL — ABNORMAL HIGH (ref 0.1–1.0)
Monocytes Relative: 7 %
Monocytes Relative: 7 %
Neutro Abs: 12.2 10*3/uL — ABNORMAL HIGH (ref 1.7–7.7)
Neutro Abs: 12.3 10*3/uL — ABNORMAL HIGH (ref 1.7–7.7)
Neutrophils Relative %: 74 %
Neutrophils Relative %: 76 %
Platelets: 166 10*3/uL (ref 150–400)
Platelets: ADEQUATE 10*3/uL (ref 150–400)
RBC: 4.26 MIL/uL (ref 3.87–5.11)
RBC: 4.27 MIL/uL (ref 3.87–5.11)
RDW: 20.8 % — ABNORMAL HIGH (ref 11.5–15.5)
RDW: 21.1 % — ABNORMAL HIGH (ref 11.5–15.5)
WBC: 16.1 10*3/uL — ABNORMAL HIGH (ref 4.0–10.5)
WBC: 16.6 10*3/uL — ABNORMAL HIGH (ref 4.0–10.5)
nRBC: 0 % (ref 0.0–0.2)
nRBC: 0.1 % (ref 0.0–0.2)

## 2019-05-11 LAB — COMPREHENSIVE METABOLIC PANEL
ALT: 13 U/L (ref 0–44)
ALT: 16 U/L (ref 0–44)
AST: 22 U/L (ref 15–41)
AST: 23 U/L (ref 15–41)
Albumin: 3.9 g/dL (ref 3.5–5.0)
Albumin: 4.2 g/dL (ref 3.5–5.0)
Alkaline Phosphatase: 77 U/L (ref 38–126)
Alkaline Phosphatase: 86 U/L (ref 38–126)
Anion gap: 14 (ref 5–15)
Anion gap: 14 (ref 5–15)
BUN: 19 mg/dL (ref 8–23)
BUN: 21 mg/dL (ref 8–23)
CO2: 16 mmol/L — ABNORMAL LOW (ref 22–32)
CO2: 19 mmol/L — ABNORMAL LOW (ref 22–32)
Calcium: 9.1 mg/dL (ref 8.9–10.3)
Calcium: 9.5 mg/dL (ref 8.9–10.3)
Chloride: 101 mmol/L (ref 98–111)
Chloride: 99 mmol/L (ref 98–111)
Creatinine, Ser: 1.21 mg/dL — ABNORMAL HIGH (ref 0.44–1.00)
Creatinine, Ser: 1.29 mg/dL — ABNORMAL HIGH (ref 0.44–1.00)
GFR calc Af Amer: 50 mL/min — ABNORMAL LOW (ref >60)
GFR calc Af Amer: 54 mL/min — ABNORMAL LOW (ref >60)
GFR calc non Af Amer: 43 mL/min — ABNORMAL LOW (ref >60)
GFR calc non Af Amer: 46 mL/min — ABNORMAL LOW (ref >60)
Glucose, Bld: 220 mg/dL — ABNORMAL HIGH (ref 70–99)
Glucose, Bld: 235 mg/dL — ABNORMAL HIGH (ref 70–99)
Potassium: 4.9 mmol/L (ref 3.5–5.1)
Potassium: 5.3 mmol/L — ABNORMAL HIGH (ref 3.5–5.1)
Sodium: 131 mmol/L — ABNORMAL LOW (ref 135–145)
Sodium: 132 mmol/L — ABNORMAL LOW (ref 135–145)
Total Bilirubin: 2.1 mg/dL — ABNORMAL HIGH (ref 0.3–1.2)
Total Bilirubin: 2.3 mg/dL — ABNORMAL HIGH (ref 0.3–1.2)
Total Protein: 7.9 g/dL (ref 6.5–8.1)
Total Protein: 8.5 g/dL — ABNORMAL HIGH (ref 6.5–8.1)

## 2019-05-11 LAB — HIV ANTIBODY (ROUTINE TESTING W REFLEX): HIV Screen 4th Generation wRfx: NONREACTIVE

## 2019-05-11 LAB — GLUCOSE, CAPILLARY: Glucose-Capillary: 216 mg/dL — ABNORMAL HIGH (ref 70–99)

## 2019-05-11 LAB — FERRITIN: Ferritin: 83 ng/mL (ref 11–307)

## 2019-05-11 LAB — BRAIN NATRIURETIC PEPTIDE: B Natriuretic Peptide: 20 pg/mL (ref 0.0–100.0)

## 2019-05-11 LAB — LACTIC ACID, PLASMA
Lactic Acid, Venous: 3.8 mmol/L (ref 0.5–1.9)
Lactic Acid, Venous: 4 mmol/L (ref 0.5–1.9)

## 2019-05-11 LAB — PROCALCITONIN: Procalcitonin: <0.1 ng/mL

## 2019-05-11 LAB — CBG MONITORING, ED
Glucose-Capillary: 196 mg/dL — ABNORMAL HIGH (ref 70–99)
Glucose-Capillary: 196 mg/dL — ABNORMAL HIGH (ref 70–99)

## 2019-05-11 LAB — D-DIMER, QUANTITATIVE: D-Dimer, Quant: <0 ug/mL-FEU — ABNORMAL LOW (ref 0.00–0.50)

## 2019-05-11 LAB — C-REACTIVE PROTEIN: CRP: 10.1 mg/dL — ABNORMAL HIGH (ref <1.0)

## 2019-05-11 MED ORDER — SENNOSIDES-DOCUSATE SODIUM 8.6-50 MG PO TABS
2.0000 | ORAL_TABLET | Freq: Every evening | ORAL | Status: DC | PRN
  Administered 2019-05-11: 2 via ORAL
  Filled 2019-05-11: qty 2

## 2019-05-11 MED ORDER — VANCOMYCIN HCL IN DEXTROSE 1-5 GM/200ML-% IV SOLN
1000.0000 mg | INTRAVENOUS | Status: DC
  Administered 2019-05-12 – 2019-05-14 (×3): 1000 mg via INTRAVENOUS
  Filled 2019-05-11 (×3): qty 200

## 2019-05-11 MED ORDER — PANTOPRAZOLE SODIUM 40 MG PO TBEC
40.0000 mg | DELAYED_RELEASE_TABLET | Freq: Every day | ORAL | Status: DC
  Administered 2019-05-11 – 2019-05-21 (×11): 40 mg via ORAL
  Filled 2019-05-11 (×11): qty 1

## 2019-05-11 MED ORDER — SODIUM CHLORIDE 0.9 % IV SOLN
1.0000 g | Freq: Once | INTRAVENOUS | Status: AC
  Administered 2019-05-11: 1 g via INTRAVENOUS
  Filled 2019-05-11 (×2): qty 1

## 2019-05-11 MED ORDER — VANCOMYCIN HCL 2000 MG/400ML IV SOLN
2000.0000 mg | Freq: Once | INTRAVENOUS | Status: AC
  Administered 2019-05-11: 2000 mg via INTRAVENOUS
  Filled 2019-05-11: qty 400

## 2019-05-11 MED ORDER — ALBUTEROL SULFATE HFA 108 (90 BASE) MCG/ACT IN AERS
2.0000 | INHALATION_SPRAY | RESPIRATORY_TRACT | Status: DC | PRN
  Filled 2019-05-11: qty 6.7

## 2019-05-11 MED ORDER — UMECLIDINIUM BROMIDE 62.5 MCG/INH IN AEPB
1.0000 | INHALATION_SPRAY | Freq: Every day | RESPIRATORY_TRACT | Status: DC
  Administered 2019-05-11 – 2019-05-21 (×9): 1 via RESPIRATORY_TRACT
  Filled 2019-05-11 (×2): qty 7

## 2019-05-11 MED ORDER — ACETAMINOPHEN 325 MG PO TABS
650.0000 mg | ORAL_TABLET | Freq: Once | ORAL | Status: AC
  Administered 2019-05-11: 650 mg via ORAL
  Filled 2019-05-11: qty 2

## 2019-05-11 MED ORDER — INSULIN ASPART 100 UNIT/ML ~~LOC~~ SOLN
0.0000 [IU] | Freq: Three times a day (TID) | SUBCUTANEOUS | Status: DC
  Administered 2019-05-11 (×2): 3 [IU] via SUBCUTANEOUS
  Administered 2019-05-12 (×2): 5 [IU] via SUBCUTANEOUS
  Administered 2019-05-12: 8 [IU] via SUBCUTANEOUS
  Administered 2019-05-13 – 2019-05-14 (×5): 5 [IU] via SUBCUTANEOUS
  Administered 2019-05-14: 8 [IU] via SUBCUTANEOUS
  Administered 2019-05-15 (×2): 5 [IU] via SUBCUTANEOUS
  Administered 2019-05-15 – 2019-05-16 (×3): 3 [IU] via SUBCUTANEOUS
  Administered 2019-05-17: 07:00:00 2 [IU] via SUBCUTANEOUS
  Administered 2019-05-17: 3 [IU] via SUBCUTANEOUS
  Administered 2019-05-17: 2 [IU] via SUBCUTANEOUS
  Administered 2019-05-18 (×2): 3 [IU] via SUBCUTANEOUS
  Administered 2019-05-18: 2 [IU] via SUBCUTANEOUS
  Administered 2019-05-19: 3 [IU] via SUBCUTANEOUS
  Administered 2019-05-19: 5 [IU] via SUBCUTANEOUS
  Administered 2019-05-19 – 2019-05-20 (×3): 2 [IU] via SUBCUTANEOUS
  Administered 2019-05-20 – 2019-05-21 (×2): 3 [IU] via SUBCUTANEOUS
  Administered 2019-05-21: 07:00:00 2 [IU] via SUBCUTANEOUS
  Filled 2019-05-11 (×2): qty 1

## 2019-05-11 MED ORDER — INSULIN ASPART 100 UNIT/ML ~~LOC~~ SOLN
0.0000 [IU] | Freq: Every day | SUBCUTANEOUS | Status: DC
  Administered 2019-05-11 – 2019-05-13 (×3): 2 [IU] via SUBCUTANEOUS
  Administered 2019-05-14: 4 [IU] via SUBCUTANEOUS
  Administered 2019-05-15: 2 [IU] via SUBCUTANEOUS

## 2019-05-11 MED ORDER — SODIUM CHLORIDE 0.9 % IV SOLN
2.0000 g | Freq: Two times a day (BID) | INTRAVENOUS | Status: DC
  Administered 2019-05-11 – 2019-05-17 (×12): 2 g via INTRAVENOUS
  Filled 2019-05-11 (×13): qty 2

## 2019-05-11 MED ORDER — DM-GUAIFENESIN ER 30-600 MG PO TB12
1.0000 | ORAL_TABLET | Freq: Two times a day (BID) | ORAL | Status: DC
  Administered 2019-05-11 – 2019-05-21 (×20): 1 via ORAL
  Filled 2019-05-11 (×22): qty 1

## 2019-05-11 MED ORDER — SODIUM CHLORIDE 0.9 % IV SOLN: Freq: Once | INTRAVENOUS | Status: AC

## 2019-05-11 MED ORDER — ONDANSETRON HCL 4 MG/2ML IJ SOLN
4.0000 mg | Freq: Four times a day (QID) | INTRAMUSCULAR | Status: DC | PRN
  Administered 2019-05-11 – 2019-05-20 (×2): 4 mg via INTRAVENOUS
  Filled 2019-05-11 (×2): qty 2

## 2019-05-11 NOTE — Progress Notes (Signed)
Subjective: This lady was admitted yesterday with generalized weakness and possible healthcare acquired pneumonia. Today she is complaining of left leg weakness.  This is the same leg that she had DVT.   Objective: Vital signs in last 24 hours: Temp:  [98.8 F (37.1 C)] 98.8 F (37.1 C) (12/26 0909) Pulse Rate:  [105-130] 110 (12/26 0909) Resp:  [15-25] 15 (12/26 0909) BP: (111-128)/(55-100) 127/76 (12/26 0909) SpO2:  [92 %-100 %] 94 % (12/26 0909) Weight:  [106.6 kg] 106.6 kg (12/25 2352) She is obese.  She does not appear to be in respiratory distress.  She is requiring 2 L/min nasal cannula oxygen and maintaining saturations.  She is alert and orientated.  Examination of the upper limbs do not show any weakness.  Examination of the legs show that there is some weakness in the left leg and she is unable to raise her leg of any significance against gravity. Intake/Output from previous day: 12/25 0701 - 12/26 0700 In: 541.6 [IV Piggyback:541.6] Out: -  Intake/Output this shift: No intake/output data recorded.  Recent Labs    05/10/19 2358 05/11/19 0316  HGB 11.7* 11.6*   Recent Labs    05/10/19 2358 05/11/19 0316  WBC 16.1* 16.6*  RBC 4.27 4.26  HCT 38.1 38.4  PLT 166 PLATELET CLUMPS NOTED ON SMEAR, COUNT APPEARS ADEQUATE   Recent Labs    05/10/19 2358 05/11/19 0316  NA 132* 131*  K 4.9 5.3*  CL 99 101  CO2 19* 16*  BUN 19 21  CREATININE 1.21* 1.29*  GLUCOSE 235* 220*  CALCIUM 9.5 9.1     Assessment/Plan: 1.  Probable healthcare associated pneumonia.  Continue with intravenous antibiotics. 2.  Type 2 diabetes mellitus.  Continue with diabetic diet and sliding scale of insulin. 3.  Possible left leg weakness.  I will monitor this and we may need to get imaging of her brain although there is no upper limb weakness and no other cranial nerve abnormalities that I can see clinically. 4.  I will continue to follow tomorrow.   Cort Dragoo C Chanson Teems 05/11/2019, 9:42 AM

## 2019-05-11 NOTE — Progress Notes (Addendum)
Pharmacy Antibiotic Note  Regina Munoz is a 68 y.o. female admitted on 05/10/2019 with pneumonia.  Pharmacy has been consulted for vancomycin dosing.  Plan: Vancomycin 2gm IV x 1 then 1gm IV q24 hours F/u renal function, cultures and clinical course  Height: 5\' 5"  (165.1 cm) Weight: 235 lb (106.6 kg) IBW/kg (Calculated) : 57  No data recorded.  Recent Labs  Lab 05/10/19 2358  WBC 16.1*  CREATININE 1.21*  LATICACIDVEN 4.0*    Estimated Creatinine Clearance: 54.7 mL/min (A) (by C-G formula based on SCr of 1.21 mg/dL (H)).    No Known Allergies   Thank you for allowing pharmacy to be a part of this patient's care.  Excell Seltzer Poteet 05/11/2019 1:09 AM    Addendum [phone]) SCr 1.29 - stable from yesterday  MD adding Cefepime per pharmacy  Plan: Cefepime 2gm IV q12h Will f/u micro data, renal function, and pt's clinical condition  Sherlon Handing, PharmD, BCPS Please see amion for complete clinical pharmacist phone list 05/11/2019 6:11 AM

## 2019-05-11 NOTE — H&P (Addendum)
History and Physical  Regina Munoz SWH:[id number] DOB: 1951/01/21 DOA: 05/10/2019  Referring physician: Carmin Muskrat PCP: Scotty Court, DO  Patient coming from: Home  Chief Complaint: Generalized weakness  HPI: Regina Munoz is a 68 y.o. female with medical history significant for asthma, diabetes mellitus, LLE DVT and PE s/p IVC filter and anxiety with complaints of progressive generalized weakness, cough and nausea that has been going on within a few days after being discharged from [facility] (1217/20) due to Covid 19.  Patient denies new onset of fever, chest pain, palpitations, diarrhea or abdominal pain.  She complained of worsening leg swelling within last 2 days with shortness of breath on ambulating about 30 feet which became concerning to family members and EMS was called.  On arrival of EMS team, patient was reported to have tachycardia with heart rate in the 130s.  ED Course: She was tachycardic and tachypneic on arrival at the ED and O2 sat ranged from 92 to 100% on supplemental oxygen at 2 LPM.  Work-up in the ED showed hyponatremia, mild hyperkalemia, hyperglycemia.  WBC 16.6, lactic acid 4.0>3.8.  Chest x-ray showed mildly increased is airspace opacity at the left lung base suspect to be due to atelectasis and/or early infectious etiology.  She was treated with IV vancomycin and cefepime, Tylenol was also given.  Hospitalist was asked to admit patient for further evaluation and management.  Review of Systems: Constitutional: Negative for chills and fever.  HENT: Negative for ear pain and sore throat.   Eyes: Negative for pain and visual disturbance.  Respiratory: Shortness of breath on exertion.  Negative for cough and chest tightness  Cardiovascular: Negative for chest pain and palpitations.  Gastrointestinal: Positive for nausea.  Negative for abdominal pain and vomiting.  Endocrine: Negative for polyphagia and polyuria.  Genitourinary: Negative for decreased urine  volume, dysuria Musculoskeletal: Negative for arthralgias and back pain.  Skin: Negative for color change and rash.  Allergic/Immunologic: Negative for immunocompromised state.  Neurological: Positive for weakness.  Negative for tremors, syncope, ight-headedness and headaches.  Hematological: Does not bruise/bleed easily.  Review of systems as noted in the HPI. All other systems reviewed and are negative.   Past Medical History:  Diagnosis Date  . Anxiety   . Arthritis   . Asthma   . Complication of anesthesia   . COVID-19   . Diabetes mellitus without complication ([place])   . Dysrhythmia    palpitations ->20 yrs stress test neg nothing since  . GERD (gastroesophageal reflux disease)   . Headache   . PONV (postoperative nausea and vomiting)    Past Surgical History:  Procedure Laterality Date  . ABDOMINAL HYSTERECTOMY    . ANTERIOR CERVICAL DECOMP/DISCECTOMY FUSION N/A 02/17/2016   Procedure: ANTERIOR CERVICAL DECOMPRESSION/DISCECTOMY FUSION 1 LEVEL C4-5;  Surgeon: Melina Schools, MD;  Location: [place];  Service: Orthopedics;  Laterality: N/A;  . EYE SURGERY Bilateral    cataracts  . HIP CLOSED REDUCTION Right 12/24/2012   Procedure: CLOSED REDUCTION HIP;  Surgeon: Sinclair Ship, MD;  Location: [facility];  Service: Orthopedics;  Laterality: Right;  . IR IVC FILTER PLMT / S&I Burke Keels GUID/MOD SED  04/29/2019  . JOINT REPLACEMENT  2002   right and left knees  . SHOULDER ARTHROSCOPY Left   . TOTAL HIP ARTHROPLASTY Right    2002    Social History:  reports that she has never smoked. She has never used smokeless tobacco. She reports that she does not drink alcohol or  use drugs.   No Known Allergies  No family history on file.    Prior to Admission medications   Medication Sig Start Date End Date Taking? Authorizing Provider  acetaminophen (TYLENOL) 500 MG tablet Take 1,000 mg by mouth every 6 (six) hours as needed for mild pain or moderate pain.     [provider]   albuterol (VENTOLIN HFA) 108 (90 Base) MCG/ACT inhaler Inhale 2 puffs into the lungs every 4 (four) hours as needed for wheezing or shortness of breath. 05/02/19   Regalado, Belkys A, MD  ascorbic acid (VITAMIN C) 500 MG tablet Take 1 tablet (500 mg total) by mouth daily. 05/02/19   Regalado, Belkys A, MD  blood glucose meter kit and supplies KIT Dispense based on patient and insurance preference. Use up to four times daily as directed. (FOR ICD-9 250.00, 250.01). 05/02/19   Regalado, Belkys A, MD  buPROPion (ZYBAN) 150 MG 12 hr tablet Take 75 mg by mouth every morning.  03/28/19   [provider]  cetirizine (ZYRTEC) 10 MG tablet Take 10 mg by mouth daily. 02/13/19   [provider]  cholecalciferol (VITAMIN D) 1000 UNITS tablet Take 1,000 Units by mouth daily.    [provider]  dextromethorphan-guaiFENesin (MUCINEX DM) 30-600 MG 12hr tablet Take 1 tablet by mouth 2 (two) times daily. 05/02/19   Regalado, Belkys A, MD  diclofenac Sodium (VOLTAREN) 1 % GEL Apply 2 g topically daily as needed (for pain).     [provider]  Insulin Glargine (LANTUS) 100 UNIT/ML Solostar Pen Inject 10 Units into the skin daily. 05/02/19   Regalado, Belkys A, MD  Insulin Pen Needle (PEN NEEDLES 3/16") 31G X 5 MM MISC 1 application by Does not apply route daily. 05/02/19   Regalado, Belkys A, MD  metFORMIN (GLUCOPHAGE) 500 MG tablet Take 500 mg by mouth 2 (two) times daily with a meal.     [provider]  omeprazole (PRILOSEC) 20 MG capsule Take 20 mg by mouth daily.    [provider]  polyethylene glycol (MIRALAX / GLYCOLAX) 17 g packet Take 17 g by mouth daily as needed for mild constipation. 05/02/19   Regalado, Belkys A, MD  tiotropium (SPIRIVA HANDIHALER) 18 MCG inhalation capsule Place 1 capsule (18 mcg total) into inhaler and inhale 2 (two) times daily. 05/02/19   Regalado, Belkys A, MD  zinc sulfate 220 (50 Zn) MG capsule Take 1 capsule (220 mg total) by  mouth daily. 05/02/19   Elmarie Shiley, MD    Physical Exam: BP 128/72   Pulse (!) 109   Resp (!) 24   Ht 5' 5"  (1.651 m)   Wt 106.6 kg   SpO2 99%   BMI 39.11 kg/m   . General: 68 y.o. year-old female well developed but ill appearing.  Alert and oriented x3. Marland Kitchen HEENT: Normocephalic, atraumatic . NECK: Supple, trachea medial . Cardiovascular: Tachycardia.  Regular rate and rhythm with no rubs or gallops.  No thyromegaly or JVD noted.  No lower extremity edema. 2/4 pulses in all 4 extremities. Marland Kitchen Respiratory: Tachypnea.  Clear to auscultation with no wheezes or rales. Good inspiratory effort. . Abdomen: Soft nontender nondistended with normal bowel sounds x4 quadrants. . Muskuloskeletal: No cyanosis, clubbing or edema noted bilaterally . Neuro: CN II-XII intact, strength, sensation, reflexes . Skin: No ulcerative lesions noted or rashes . Psychiatry: Judgement and insight appear normal. Mood is appropriate for condition and setting  Labs on Admission:  Basic Metabolic Panel: Recent Labs  Lab 05/10/19 2358 05/11/19 0316  NA 132* 131*  K 4.9 5.3*  CL 99 101  CO2 19* 16*  GLUCOSE 235* 220*  BUN 19 21  CREATININE 1.21* 1.29*  CALCIUM 9.5 9.1   Liver Function Tests: Recent Labs  Lab 05/10/19 2358 05/11/19 0316  AST 22 23  ALT 16 13  ALKPHOS 86 77  BILITOT 2.3* 2.1*  PROT 8.5* 7.9  ALBUMIN 4.2 3.9   No results for input(s): LIPASE, AMYLASE in the last 168 hours. No results for input(s): AMMONIA in the last 168 hours. CBC: Recent Labs  Lab 05/10/19 2358 05/11/19 0316  WBC 16.1* 16.6*  NEUTROABS 12.3* 12.2*  HGB 11.7* 11.6*  HCT 38.1 38.4  MCV 89.2 90.1  PLT 166 PLATELET CLUMPS NOTED ON SMEAR, COUNT APPEARS ADEQUATE   Cardiac Enzymes: No results for input(s): CKTOTAL, CKMB, CKMBINDEX, TROPONINI in the last 168 hours.  BNP (last 3 results) Recent Labs    04/24/19 0500 05/10/19 2358  BNP 193.7* 20.0    ProBNP (last 3 results) No results for  input(s): PROBNP in the last 8760 hours.  CBG: No results for input(s): GLUCAP in the last 168 hours.  Radiological Exams on Admission: DG Chest Port 1 View  Result Date: 05/11/2019 CLINICAL DATA:  Shortness of breath EXAM: PORTABLE CHEST 1 VIEW COMPARISON:  April 24, 2019 FINDINGS: The heart size and mediastinal contours are within normal limits. Mildly increased hazy airspace opacity seen at the left lung base. The visualized skeletal structures are unremarkable. IMPRESSION: Mildly increased hazy airspace opacity at the left lung base which could be due to atelectasis and/or early infectious etiology. Electronically Signed   By: Prudencio Pair M.D.   On: 05/11/2019 00:27    EKG: I independently viewed the EKG done and my findings are as followed: EKG not done in the ED  Assessment/Plan Present on Admission: . HCAP (healthcare-associated pneumonia) . 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  Lactic acid was elevated (4.0>3.8)  Chest x-ray showed mildly increased is airspace opacity at the left lung base suspect to be due to atelectasis and/or early infectious etiology.   She was empirically started on IV vancomycin and cefepime, we shall continue with same at this time with plan to de-escalate based on procalcitonin and blood culture It is also possible that patient's condition is due to post COVID-19 symptoms, inflammatory markers will be checked.  Lactic acidosis 4.0 > 3.8; continue gentle hydration   Bilateral pulmonary embolism Patient had IVC filter placement on 12/14 at [facility] Patient was not placed on any further anticoagulation  due to large pelvis hematoma Continue supplemental oxygen as needed  Left lower extremity DVT Patient underwent IVC filter placement  Asthma Continue home meds  Anxiety/depression Continue home meds when med rec is updated  Type 2 diabetes with hyperglycemia Continue insulin sliding scale and hypoglycemia protocol Hold Metformin at this time  DVT prophylaxis: SCDs  Code Status: Full code  Family Communication: None  at bedside  Disposition Plan: Home when clinically stable  Consults called: None  Admission status: Observation    Bernadette Hoit MD [facility]  If 7PM-7AM, please contact night-coverage [email or web address]  05/11/2019, 5:53 AM

## 2019-05-11 NOTE — ED Notes (Signed)
Date and time results received: 05/11/19 12:52 AM  (use smartphrase ".now" to insert current time)  Test: lactic Critical Value: 4.0  Name of Provider Notified: [place]  Orders Received? Or Actions Taken?: see chart

## 2019-05-12 ENCOUNTER — Inpatient Hospital Stay (HOSPITAL_COMMUNITY): Payer: PPO

## 2019-05-12 LAB — COMPREHENSIVE METABOLIC PANEL
ALT: 13 U/L (ref 0–44)
AST: 17 U/L (ref 15–41)
Albumin: 3.7 g/dL (ref 3.5–5.0)
Alkaline Phosphatase: 76 U/L (ref 38–126)
Anion gap: 13 (ref 5–15)
BUN: 27 mg/dL — ABNORMAL HIGH (ref 8–23)
CO2: 18 mmol/L — ABNORMAL LOW (ref 22–32)
Calcium: 9.5 mg/dL (ref 8.9–10.3)
Chloride: 98 mmol/L (ref 98–111)
Creatinine, Ser: 1.19 mg/dL — ABNORMAL HIGH (ref 0.44–1.00)
GFR calc Af Amer: 55 mL/min — ABNORMAL LOW (ref >60)
GFR calc non Af Amer: 47 mL/min — ABNORMAL LOW (ref >60)
Glucose, Bld: 233 mg/dL — ABNORMAL HIGH (ref 70–99)
Potassium: 4.4 mmol/L (ref 3.5–5.1)
Sodium: 129 mmol/L — ABNORMAL LOW (ref 135–145)
Total Bilirubin: 2.2 mg/dL — ABNORMAL HIGH (ref 0.3–1.2)
Total Protein: 8 g/dL (ref 6.5–8.1)

## 2019-05-12 LAB — GLUCOSE, CAPILLARY
Glucose-Capillary: 242 mg/dL — ABNORMAL HIGH (ref 70–99)
Glucose-Capillary: 286 mg/dL — ABNORMAL HIGH (ref 70–99)
Glucose-Capillary: 226 mg/dL — ABNORMAL HIGH (ref 70–99)
Glucose-Capillary: 202 mg/dL — ABNORMAL HIGH (ref 70–99)

## 2019-05-12 LAB — CBC
HCT: 36.1 % (ref 36.0–46.0)
Hemoglobin: 10.8 g/dL — ABNORMAL LOW (ref 12.0–15.0)
MCH: 26.6 pg (ref 26.0–34.0)
MCHC: 29.9 g/dL — ABNORMAL LOW (ref 30.0–36.0)
MCV: 88.9 fL (ref 80.0–100.0)
Platelets: 152 10*3/uL (ref 150–400)
RBC: 4.06 MIL/uL (ref 3.87–5.11)
RDW: 20.8 % — ABNORMAL HIGH (ref 11.5–15.5)
WBC: 17.7 10*3/uL — ABNORMAL HIGH (ref 4.0–10.5)
nRBC: 0 % (ref 0.0–0.2)

## 2019-05-12 LAB — PHOSPHORUS: Phosphorus: 3.3 mg/dL (ref 2.5–4.6)

## 2019-05-12 LAB — MAGNESIUM: Magnesium: 1.9 mg/dL (ref 1.7–2.4)

## 2019-05-12 MED ORDER — POLYETHYLENE GLYCOL 3350 17 G PO PACK
17.0000 g | PACK | Freq: Every day | ORAL | Status: DC
  Administered 2019-05-12 – 2019-05-14 (×3): 17 g via ORAL
  Filled 2019-05-12 (×3): qty 1

## 2019-05-12 MED ORDER — SENNOSIDES-DOCUSATE SODIUM 8.6-50 MG PO TABS
2.0000 | ORAL_TABLET | Freq: Two times a day (BID) | ORAL | Status: DC
  Administered 2019-05-12 – 2019-05-20 (×11): 2 via ORAL
  Filled 2019-05-12 (×14): qty 2

## 2019-05-12 MED ORDER — IOHEXOL 300 MG/ML  SOLN
80.0000 mL | Freq: Once | INTRAMUSCULAR | Status: AC | PRN
  Administered 2019-05-12: 80 mL via INTRAVENOUS

## 2019-05-12 MED ORDER — SODIUM CHLORIDE 0.9 % IV SOLN: INTRAVENOUS | Status: DC

## 2019-05-12 MED ORDER — METFORMIN HCL 500 MG PO TABS
500.0000 mg | ORAL_TABLET | Freq: Two times a day (BID) | ORAL | Status: DC
  Administered 2019-05-12: 500 mg via ORAL
  Filled 2019-05-12 (×2): qty 1

## 2019-05-12 NOTE — Progress Notes (Addendum)
Subjective: She is now complaining of extreme fatigue, describes bilateral leg pain more on the left and also increasing swelling on the left leg.  She is also complaining of lower abdominal and pelvic discomfort/pain.  She has been constipated for the last several days almost a week.   Objective: Vital signs in last 24 hours: Temp:  [98.9 F (37.2 C)-99.6 F (37.6 C)] 99.6 F (37.6 C) (12/27 0516) Pulse Rate:  [107-123] 119 (12/27 0516) Resp:  [20-28] 20 (12/27 0516) BP: (89-152)/(63-111) 129/87 (12/27 0516) SpO2:  [94 %-100 %] 98 % (12/27 0516) Her left leg is definitely more swollen than the right and both legs are very weak, equally.  She remains alert and orientated and no significant respiratory distress. Intake/Output from previous day: 12/26 0701 - 12/27 0700 In: 300 [IV Piggyback:300] Out: -  Intake/Output this shift: No intake/output data recorded.  Recent Labs    05/10/19 2358 05/11/19 0316 05/12/19 0609  HGB 11.7* 11.6* 10.8*   Recent Labs    05/11/19 0316 05/12/19 0609  WBC 16.6* 17.7*  RBC 4.26 4.06  HCT 38.4 36.1  PLT PLATELET CLUMPS NOTED ON SMEAR, COUNT APPEARS ADEQUATE 152   Recent Labs    05/11/19 0316 05/12/19 0609  NA 131* 129*  K 5.3* 4.4  CL 101 98  CO2 16* 18*  BUN 21 27*  CREATININE 1.29* 1.19*  GLUCOSE 220* 233*  CALCIUM 9.1 9.5     Assessment/Plan: 1.  Healthcare associated pneumonia.  Continue with intravenous antibiotics. 2.  Bilateral leg swelling, more on the left with weakness associated.  I will order repeat venous ultrasound Dopplers of both legs to evaluate for extent of DVT. 3.  Pelvic/lower abdominal pain.  I will order CT scan of the abdomen/pelvis without contrast to further evaluate the pelvic hematoma that she had previously.  She has an IVC filter in situ. 4.  Constipation.  Add MiraLAX to increase Senokot doses.  She may need enemas. 5.  Hyponatremia.  I am not sure how well hydrated she is.  I will add low-dose IV  normal saline.  Follow electrolytes. 6.  Weakness.  I will ask physical therapy to assist again to get her more mobile hopefully. Addendum:I spoke with Radiologist who informed me that bilateral venous dopplers in legs show large amount of bilateral DVT.Await CT ABDO/PELVIS. I will transfer patient to [facility] with possibility of thrombectomy.I have spoken to [facility]) who has kindly accepted patient. I have spoken to the patient and her husband regarding transfer.  Dudley Mages C Jaree Dwight 05/12/2019, 11:11 AM

## 2019-05-12 NOTE — Progress Notes (Signed)
1900 received Mrs. Regina Munoz from transfer from [facility] ED.  Pt is A,A & O x 4.  Denies pain at this time.  CHG bath completed.  Placed on cardiac monitor with HR ST 120 ish, registered with CCMD.  IV infusing NS at 50 cc/hr in right [facility], site unremarkable.  Placed on alaris pump with rate of 50.  Orientation to room.

## 2019-05-12 NOTE — Progress Notes (Signed)
Report called to Frazier Richards, RN at [facility]. [facility] called and awaiting transport. Patient and husband made aware.

## 2019-05-13 DIAGNOSIS — A419 Sepsis, unspecified organism: Secondary | ICD-10-CM

## 2019-05-13 DIAGNOSIS — J181 Lobar pneumonia, unspecified organism: Secondary | ICD-10-CM

## 2019-05-13 DIAGNOSIS — R6520 Severe sepsis without septic shock: Secondary | ICD-10-CM

## 2019-05-13 LAB — GLUCOSE, CAPILLARY
Glucose-Capillary: 235 mg/dL — ABNORMAL HIGH (ref 70–99)
Glucose-Capillary: 240 mg/dL — ABNORMAL HIGH (ref 70–99)
Glucose-Capillary: 217 mg/dL — ABNORMAL HIGH (ref 70–99)
Glucose-Capillary: 250 mg/dL — ABNORMAL HIGH (ref 70–99)

## 2019-05-13 MED ORDER — GLUCERNA SHAKE PO LIQD
237.0000 mL | Freq: Three times a day (TID) | ORAL | Status: DC
  Administered 2019-05-13 – 2019-05-21 (×15): 237 mL via ORAL
  Filled 2019-05-13 (×3): qty 237

## 2019-05-13 NOTE — Consult Note (Addendum)
Hospital Consult    Reason for Consult:  DVT Requesting Physician:  Sarajane Jews MRN #:  [id number]  History of Present Illness: This is a 68 y.o. female who was admitted to the hospital a couple of days ago with c/o progressive generalized weakness, cough and nausea and increasing swelling of the left leg with shortness of breath after being d/c'd from [facility] on 05/02/2019 due to Covid (diagnosed 04/15/2019).  EMS was called and she was brought to the ER and found to be tachycardic with O2 sats from 92-100% on 2LO2NC.  Work-up in the ED showed hyponatremia, mild hyperkalemia, hyperglycemia.    Her previous hospitalization - She presented with acute hypoxic respiratory failure, oxygen saturation 80 on room air. Chest x-ray show patchy infiltrates worse at the bases. Patient was a started on remdesivir and Decadron. Hospital course was complicated by worsening hypoxemia on ambulation. CTA was positive for massive bilateral pulmonary embolism. She was a started on heparin drip. Patient then developed left lower quadrant abdominal pain for which she had a CT abdomen and pelvis done without contrast which show a spontaneous left pelvic hematoma. IR was consulted for possible embolization. Patient hemoglobin dropped to 6.7. She was transfused 2 unit of packed red blood cell on 04/28/2019. Repeated CT abdomen 12/13 showed no significant change of  large pelvic hematoma. Patient had lower extremity ultrasound which showed on the left finding consistent with age indeterminate deep vein thrombosis involving the left posterior tibial veins and left peroneal veins.IR was consulted for IVC filterplacement.Patient underwent successful IVC filter placement on 04/29/2019.  She states that her left leg is worse than the right and has continued swelling up to her thigh.  She states she has some swelling in the right leg, but not as bad as the left.  Her left leg also is more sore than the right.  She does not  have hx of DVT prior to this.  She was not d/c'd on [facility] due to RP hematoma.  She states she was having trouble voiding, but this has improved.  The pt is not on a statin for cholesterol management.  The pt is not on a daily aspirin.   Other AC:  none The pt is not on meds for hypertension.   The pt is diabetic.   Tobacco hx:  never  Past Medical History:  Diagnosis Date  . Anxiety   . Arthritis   . Asthma   . Complication of anesthesia   . COVID-19   . Diabetes mellitus without complication ([place])   . Dysrhythmia    palpitations ->20 yrs stress test neg nothing since  . GERD (gastroesophageal reflux disease)   . Headache   . PONV (postoperative nausea and vomiting)     Past Surgical History:  Procedure Laterality Date  . ABDOMINAL HYSTERECTOMY    . ANTERIOR CERVICAL DECOMP/DISCECTOMY FUSION N/A 02/17/2016   Procedure: ANTERIOR CERVICAL DECOMPRESSION/DISCECTOMY FUSION 1 LEVEL C4-5;  Surgeon: Melina Schools, MD;  Location: [facility];  Service: Orthopedics;  Laterality: N/A;  . EYE SURGERY Bilateral    cataracts  . HIP CLOSED REDUCTION Right 12/24/2012   Procedure: CLOSED REDUCTION HIP;  Surgeon: Sinclair Ship, MD;  Location: [facility];  Service: Orthopedics;  Laterality: Right;  . IR IVC FILTER PLMT / S&I Burke Keels GUID/MOD SED  04/29/2019  . JOINT REPLACEMENT  2002   right and left knees  . SHOULDER ARTHROSCOPY Left   . TOTAL HIP ARTHROPLASTY Right    2002    No  Known Allergies  Prior to Admission medications   Medication Sig Start Date End Date Taking? Authorizing Provider  acetaminophen (TYLENOL) 500 MG tablet Take 1,000 mg by mouth every 6 (six) hours as needed for mild pain or moderate pain.    Yes [provider]  albuterol (VENTOLIN HFA) 108 (90 Base) MCG/ACT inhaler Inhale 2 puffs into the lungs every 4 (four) hours as needed for wheezing or shortness of breath. 05/02/19  Yes Regalado, Belkys A, MD  ascorbic acid (VITAMIN C) 500 MG tablet Take 1 tablet (500 mg  total) by mouth daily. 05/02/19  Yes Regalado, Belkys A, MD  blood glucose meter kit and supplies KIT Dispense based on patient and insurance preference. Use up to four times daily as directed. (FOR ICD-9 250.00, 250.01). 05/02/19  Yes Regalado, Belkys A, MD  buPROPion (ZYBAN) 150 MG 12 hr tablet Take 75 mg by mouth every morning.  03/28/19  Yes [provider]  cetirizine (ZYRTEC) 10 MG tablet Take 10 mg by mouth daily. 02/13/19  Yes [provider]  cholecalciferol (VITAMIN D) 1000 UNITS tablet Take 1,000 Units by mouth daily.   Yes [provider]  dextromethorphan-guaiFENesin (MUCINEX DM) 30-600 MG 12hr tablet Take 1 tablet by mouth 2 (two) times daily. 05/02/19  Yes Regalado, Belkys A, MD  diclofenac Sodium (VOLTAREN) 1 % GEL Apply 2 g topically daily as needed (for pain).    Yes [provider]  Insulin Glargine (LANTUS) 100 UNIT/ML Solostar Pen Inject 10 Units into the skin daily. 05/02/19  Yes Regalado, Belkys A, MD  Insulin Pen Needle (PEN NEEDLES 3/16") 31G X 5 MM MISC 1 application by Does not apply route daily. 05/02/19  Yes Regalado, Belkys A, MD  metFORMIN (GLUCOPHAGE) 500 MG tablet Take 500 mg by mouth 2 (two) times daily with a meal.    Yes [provider]  omeprazole (PRILOSEC) 20 MG capsule Take 20 mg by mouth daily.   Yes [provider]  polyethylene glycol (MIRALAX / GLYCOLAX) 17 g packet Take 17 g by mouth daily as needed for mild constipation. 05/02/19  Yes Regalado, Belkys A, MD  tiotropium (SPIRIVA HANDIHALER) 18 MCG inhalation capsule Place 1 capsule (18 mcg total) into inhaler and inhale 2 (two) times daily. 05/02/19  Yes Regalado, Belkys A, MD  zinc sulfate 220 (50 Zn) MG capsule Take 1 capsule (220 mg total) by mouth daily. 05/02/19  Yes Regalado, Cassie Freer, MD    Social History   Socioeconomic History  . Marital status: Married    Spouse name: Not on file  . Number of children: Not on file  . Years of education:  Not on file  . Highest education level: Not on file  Occupational History  . Not on file  Tobacco Use  . Smoking status: Never Smoker  . Smokeless tobacco: Never Used  Substance and Sexual Activity  . Alcohol use: No  . Drug use: No  . Sexual activity: Not on file  Other Topics Concern  . Not on file  Social History Narrative  . Not on file   Social Determinants of Health   Financial Resource Strain: Low Risk   . Difficulty of Paying Living Expenses: Not hard at all  Food Insecurity: No Food Insecurity  . Worried About Charity fundraiser in the Last Year: Never true  . Ran Out of Food in the Last Year: Never true  Transportation Needs: No Transportation Needs  . Lack of Transportation (Medical): No  .  Lack of Transportation (Non-Medical): No  Physical Activity: Unknown  . Days of Exercise per Week: 1 day  . Minutes of Exercise per Session: Not on file  Stress:   . Feeling of Stress : Not on file  Social Connections:   . Frequency of Communication with Friends and Family: Not on file  . Frequency of Social Gatherings with Friends and Family: Not on file  . Attends Religious Services: Not on file  . Active Member of Clubs or Organizations: Not on file  . Attends Archivist Meetings: Not on file  . Marital Status: Not on file  Intimate Partner Violence:   . Fear of Current or Ex-Partner: Not on file  . Emotionally Abused: Not on file  . Physically Abused: Not on file  . Sexually Abused: Not on file     No family history on file.  ROS: _0  Positive   _1  Negative   _2  All sytems reviewed and are negative  Cardiac: _3  SOB  _4  DOE  Vascular: _5  hx of DVT/PE _6  swelling in legs with left worse than right  Pulmonary: _7  covid PNA  Neurologic: _8  hx of CVA _9  mini stroke  Hematologic: _10  bleeding problems-spontaneous RP hematoma  Endocrine:   _11  diabetes  GI _12  GERD  GU: _13  CKD/renal failure _14  HD--_15  M/W/F or _16   T/T/S   Psychiatric: _17  anxiety  Musculoskeletal: _18  arthritis  Integumentary: _19  rashes _20  ulcers  Constitutional: _21  fever _22  chills   Physical Examination  Vitals:   05/13/19 0400 05/13/19 0600  BP: 118/64 (!) 102/59  Pulse: (!) 108 (!) 103  Resp: 19 20  Temp: 98.6 F (37 C)   SpO2: 99% 100%   Body mass index is 39.11 kg/m.  General:  WDWN in NAD Gait: Not observed HENT: WNL, normocephalic Pulmonary: normal non-labored breathing on supplemental O2  Cardiac: regular Abdomen:  soft, slightly tender upon palpation Skin: without rashes Vascular Exam/Pulses:  Right Left  Radial 1+ (weak) 2+ (normal)  Ulnar 2+ (normal) Unable to palpate   DP Faintly palpable and Biphasic doppler signal 2+ (normal) & biphasic doppler signal  PT Biphasic doppler signal Biphasic doppler signal   Extremities: without ischemic changes, without Gangrene , without cellulitis; without open wounds; BLE swelling up to thigh with left > right Musculoskeletal: no muscle wasting or atrophy  Neurologic: A&O X 3;  No focal weakness or paresthesias are detected; speech is fluent/normal Psychiatric:  The pt has Normal affect.   CBC    Component Value Date/Time   WBC 17.7 (H) 05/12/2019 0609   RBC 4.06 05/12/2019 0609   HGB 10.8 (L) 05/12/2019 0609   HCT 36.1 05/12/2019 0609   PLT 152 05/12/2019 0609   MCV 88.9 05/12/2019 0609   MCH 26.6 05/12/2019 0609   MCHC 29.9 (L) 05/12/2019 0609   RDW 20.8 (H) 05/12/2019 0609   LYMPHSABS 2.6 05/11/2019 0316   MONOABS 1.2 (H) 05/11/2019 0316   EOSABS 0.1 05/11/2019 0316   BASOSABS 0.1 05/11/2019 0316    BMET    Component Value Date/Time   NA 129 (L) 05/12/2019 0609   K 4.4 05/12/2019 0609   CL 98 05/12/2019 0609   CO2 18 (L) 05/12/2019 0609   GLUCOSE 233 (H) 05/12/2019 0609   BUN 27 (H) 05/12/2019 0609   CREATININE 1.19 (H) 05/12/2019 0609   CALCIUM 9.5 05/12/2019 0609   GFRNONAA 47 (L) 05/12/2019 0609   GFRAA 55 (L) 05/12/2019 3614     COAGS:  Lab Results  Component Value Date   INR 1.4 (H) 04/27/2019     Non-Invasive Vascular Imaging:   Venous duplex BLE 05/12/2019: IMPRESSION: Significant progression of bilateral lower extremity DVT with occlusive thrombus throughout both lower extremities. In the setting of an indwelling IVC filter, there would potentially be some concern of additional iliac and IVC thrombosis extending to the level of the filter. Consider CT venography of the abdomen and pelvis with contrast for further evaluation  CT abdomen/pelvis 05/12/2019: IMPRESSION: No significant interval change in size of large pelvic hematoma, 15.6 x 16.9 x 10.0 cm, displacing bowel and compressing bladder; this likely originates from the LEFT rectus abdominus muscle.  Probable cirrhotic liver.  Cholelithiasis.   ASSESSMENT/PLAN: This is a 68 y.o. female with recent covid 19 diagnosis with BLE DVT/PE and spontaneous RP hematoma with placement of IVC filter on 04/29/2019 by IR  -pt with significant bilateral lower extremity DVT.  During her last admission, she was placed on heparin gtt for DVT/PE and developed a spontaneous RP hematoma and heparin was discontinued and IVC filter placed.  She had a CT scan yesterday that revealed there was no significant change in the size of the RP hematoma.  Will d/w Dr. Oneida Alar about mechanical thrombectomy or thrombolysis but this may be complicated due to not being able to receive heparin or anticoagulation.   -renal function ok, but is elevated from last admission.  Leontine Locket, PA-C [facility] [phone]   Pt seen and examined.  She has adequate lower extremity perfusion.  Her feet are pink and warm with doppler signals she has some pain in the left calf but compartments are soft.  She does not have a phlegmatic leg currently.  Most likely pt has occluded her IVC filter either spontaneously or because it did its job and collected embolic  fragments.  I would not consider any vascular surgical procedure at this point.  Without anticoagulation she will be high likeliehood to reocclude the filter and her limbs although swollen are not currently threatened. She will most likely have chronically swollen legs but will hopefully collateralize over time.  Risk of procedure is much higher than any potential benefit if she cannot be anticoagulated.  Would recommend bilateral ACE wraps to control edema  Would recommend informing Dr Pascal Lux from IR who placed to filter so he can follow up with pt.  Call if questions  Plan pathophysiology and situation discussed with pt and her husband at bedside.  Ruta Hinds, MD [facility] [place] Office: [phone]

## 2019-05-13 NOTE — Progress Notes (Signed)
PT Cancellation Note  Patient Details Name: Regina Munoz MRN: [id number] DOB: [birth year]   Cancelled Treatment:    Reason Eval/Treat Not Completed: Medical issues which prohibited therapy Pt with multiple new DVTs in BLEs, also with big hematoma pushing on her bladder. Awaiting consult from Vascular for possible thrombectomy. Pt currently not on anticoagulation, has IVC filter. Will follow for medically readiness.    Marguarite Arbour A Nishanth Mccaughan 05/13/2019, 9:30 AM Marisa Severin, PT, DPT Acute Rehabilitation Services Pager [phone] Office [phone]

## 2019-05-13 NOTE — Progress Notes (Signed)
Spoke with Pt'son, Lavonda Jumbo. Wion by phone. Information update given, all question answered. He expressed appreciations. He requested MD please call to discuss about treatment planing today. Thank you.  Kennyth Lose, RN

## 2019-05-13 NOTE — Progress Notes (Signed)
PROGRESS NOTE  Regina Munoz OVF:[id number] DOB: Jun 16, 1950 DOA: 05/10/2019 PCP: Scotty Court, DO  Brief History   68 year old woman discharged 12/17 following treatment for Covid pneumonia diagnosed 11/30, hospital course complicated by worsening hypoxemia leading to CTA which revealed massive bilateral pulmonary embolism with RV strain, started on heparin infusion, then developed abdominal pain and CT revealed spontaneous left pelvic hematoma.  IR consulted for possible embolization but patient stabilized after transfusion.  Left lower extremity venous ultrasound showed DVT.  IR was consulted and IVC filter placed 12/14.  She was subsequently discharged home on oxygen, no anticoagulation.  She was seen both by interventional radiology and pulmonology during that hospitalization.  Presented late 12/25 with increasing fatigue.  Was admitted for severe sepsis from pneumonia, subsequently complained of increasing lower extremity edema and pain, ultrasound revealed extensive bilateral lower extremity DVTs with progression with concern for involvement into the pelvis and even to IVC filter.  She was transferred to [facility] for further evaluation.  A & P  Severe sepsis secondary to lobar pneumonia (poa), with tachycardia, tachypnea and lactic acidosis on admission, chest x-ray with opacity left lung base.  Empirically started on vancomycin and cefepime. --From this standpoint she appears quite well and I wonder if her symptoms were not more related to known PE and worsening DVT.   --Stable on 1 L nasal cannula, respiratory status also appears stable.  Condition continues to improve, will transition to oral antibiotics within the next 48 hours. --Suspect major issue to deal with is related to DVT, IVC filter and no PE  Extensive bilateral lower extremity DVT with concern for progression to the iliac and IVC given known IVC filter. --Pulses dopplerable. --Will consult vascular surgery  (discussed with Dr. Oneida Alar) and interventional radiology (d/w Anderson Malta PA) for recommendations. --Condition is complicated by extensive hematoma that occurred spontaneously while on heparin infusion less than 3 weeks ago.  Extensive large pelvic hematoma displacing bowel and compressing bladder, likely originating from left rectus abdominis muscle. --No significant interval change on CT 12/27 compared to previous study --Hemoglobin also stable  Hyponatremia.  Suspect dehydration --IV fluids.  Check BMP in a.m.  Constipation.  Of note pelvic hematoma causes displacement of the bowel and compression of the bladder.  If develops difficulty voiding or constipations unrelieved, may need to consider evacuation. --Aggressive bowel regimen  Diabetes mellitus type 2 --CBG stable.  Chart Review . As documented above  Complicated and difficult situation, currently hemodynamics and oxygenation appears stable, but patient has extensive bilateral lower extremity DVT with concern for IVC filter occlusion and known massive bilateral pulmonary embolism, in context of inability to anticoagulate secondary to large spontaneous pelvic hematoma approximately 2 weeks ago.  Prognosis guarded.  DVT prophylaxis: TED hose Code Status: Full Family Communication: none Disposition Plan: home    Murray Hodgkins, MD  [facility] Direct contact: see [email or web address] (further directions at bottom of note if needed) 7PM-7AM contact night coverage as at bottom of note 05/13/2019, 10:14 AM  LOS: 2 days   Significant Hospital Events   . 12/26 admitted for severe sepsis secondary to pneumonia   Consults:  .    Procedures:  .   Significant Diagnostic Tests:  . 12/26 chest x-ray left lower lobe opacity . 1227 bilateral lower extremity Doppler: Significant progression bilateral lower extremity DVT with occlusive thrombus throughout, potentially some concern for additional iliac and IVC thrombosis extending to the  level of the filter . 12/27 CT abdomen pelvis.  No significant  interval change large pelvic hematoma 15 x 16 x 10, displacing bowel and compressing bladder, likely originates left rectus abdominal muscle   Micro Data:  .    Antimicrobials:  .   Interval History/Subjective  Shortness of breath is stable or a little bit improved compared to discharge nearly 2 weeks ago.  No abdominal pain now.  Constipated for a few days.  Main issue is bilateral leg pain and swelling.  Objective   Vitals:  Vitals:   05/13/19 0400 05/13/19 0600  BP: 118/64 (!) 102/59  Pulse: (!) 108 (!) 103  Resp: 19 20  Temp: 98.6 F (37 C)   SpO2: 99% 100%    Exam:  Constitutional.  Appears calm, comfortable. Psychiatric.  Grossly normal mood and affect.  Speech fluent and appropriate. Respiratory.  Clear to auscultation bilaterally.  No wheezes, rales or rhonchi.  Normal respiratory effort.  On oxygen 1 L nasal cannula. Cardiovascular.  Tachycardic, regular rhythm.  No murmur, rub or gallop.  3+ bilateral lower extremity edema.  There is swelling suprapubic area.  Faint 1+ dorsalis pedis pulses bilaterally. Abdomen.  Obese, soft, nontender, nondistended.  No significant ecchymosis seen. Skin.  Bilateral lower extremities appears grossly unremarkable.  Skin is pink and appears well perfused.  Capillary refill is acceptable.  I have personally reviewed the following:   Today's Data  . BUN higher at 27, creatinine better at 1.19.  Phosphorus and magnesium within normal limits.  LFTs unremarkable. . WBC without significant change, 17.7.  Hemoglobin stable at 10.8. Marland Kitchen CBG stable  Scheduled Meds: . dextromethorphan-guaiFENesin  1 tablet Oral BID  . insulin aspart  0-15 Units Subcutaneous TID WC  . insulin aspart  0-5 Units Subcutaneous QHS  . pantoprazole  40 mg Oral Daily  . polyethylene glycol  17 g Oral Daily  . senna-docusate  2 tablet Oral BID  . umeclidinium bromide  1 puff Inhalation Daily    Continuous Infusions: . sodium chloride 50 mL/hr at 05/13/19 0156  . ceFEPime (MAXIPIME) IV 2 g (05/13/19 0153)  . vancomycin 1,000 mg (05/13/19 0001)    Principal Problem:   DVT (deep venous thrombosis) (HCC) Active Problems:   DM type 2 without retinopathy (HCC)   Bilateral pulmonary embolism (HCC)   SIRS (systemic inflammatory response syndrome) (HCC)   Anxiety   Depression   Asthma   Lactic acidosis   Severe sepsis (HCC)   Lobar pneumonia (HCC)   LOS: 2 days   How to contact the [facility] Attending or Consulting provider 7A - 7P or covering provider during after hours 7P -7A, for this patient?  1. Check the care team in [facility] and look for a) attending/consulting [facility] provider listed and b) the [facility] team listed 2. Log into [email or web address] and use [place]'s universal password to access. If you do not have the password, please contact the hospital operator. 3. Locate the [facility] provider you are looking for under [facility] and page to a number that you can be directly reached. 4. If you still have difficulty reaching the provider, please page the [facility] (Director on Call) for the Hospitalists listed on amion for assistance.

## 2019-05-13 NOTE — Progress Notes (Addendum)
Plan of care reviewed, Pt's progressing. Alert, awake and oriented x 4. Denied pain. Afebrile, EKG was sinus tachycardia on monitor, HR 110-125, BP 112/74 mmHg, SPO2 1005 with O2 NCL 1 LPM for her comfort.  No shortness of breath. RR 20-22. Auscultated lung clear bilaterally. Negative for wheezing or crackle sound. Dehydration's treated with 0.9% NSS 50 ml/hr and encouraged fluid intake orally.  Both legs swelling, Dorsalis Pedis pulse and Posterior Tibial and anterior Tibial pulses strong signals, detectable by doppler. Denied legs pain.  Left leg weaker than right leg. No immediate distress noted. We will continue to monitor.  Kennyth Lose, RN

## 2019-05-13 NOTE — Progress Notes (Signed)
Initial Nutrition Assessment  DOCUMENTATION CODES:   Obesity unspecified  INTERVENTION:  -Glucerna Shake po TID, each supplement provides 220 kcal and 10 grams of protein   NUTRITION DIAGNOSIS:   Increased nutrient needs related to acute illness(sepsis secondary to pneumonia) as evidenced by estimated needs.   GOAL:   Patient will meet greater than or equal to 90% of their needs   MONITOR:   Labs, I & O's, Skin, Supplement acceptance, Weight trends, PO intake  REASON FOR ASSESSMENT:   Malnutrition Screening Tool    ASSESSMENT:  68 year old female with past medical history of asthma, T2DM, LLE DVT and PE s/p IVC filter, recent discharge from [facility] on 12/17 for COVID 19 who presented with complaints of progressive generalized weakness, cough, and nausea that began a few days after discharge. She reports worsening leg swelling and SOB on ambulating over the past 2 days. In ED, pt on 2L supplemental oxygen; CXR showed mildly increased airspace opacity at left lung base suspected to be due to atelectasis and/or early infectious etiology and admitted for further evaluation and management.  Patient admitted for severe sepsis from pneumonia.  Per chart, pt with guarded prognosis. US Doppler of LLE completed secondary to complaints of increased edema and pain showed extensive bilateral lower extremity DVTs with progression concerning for involvement into pelvis and IVC filter. Condition complicated by extensive hematoma that occurred spontaneously while on heparin infusion 3 weeks ago, vascular surgery consulted.   Patient seen by vascular this morning, per notes CT venography of abdomen and pelvis with contrast for further evaluation recommended. Patient with significant progression of BLE DVT with occlusive thrombus throughout BLE.  Patient diet advanced to CM this afternoon, no recorded meals at this time for review. Per chart review, pt consuming 100% of meals during last admission.  Will continue to monitor for po intake and provide Glucerna to aid with calorie/protein needs.   I/Os: +1882 since admit   +1041 ml x 24 hrs UOP: 200 ml x 24 hrs  Current wt 106.6 kg (234.5 lb) +2 BLE edema per review of 12/28 RN assessment.  Weight history reviewed, pt has lost 3.54 lbs in the past 2 weeks which is insignificant for time frame.  Medications reviewed and include: SS novolog, Protonix, Miralax, Senokot, Maxipime, Vancomycin  Labs: CBGs 217-240 x 24 hrs, Na 129 (L), BUN 27 (H), Cr 1.19 (H)  NUTRITION - FOCUSED PHYSICAL EXAM: Deferred    Diet Order:   Diet Order            Diet NPO time specified  Diet effective midnight        Diet Carb Modified Fluid consistency: Thin; Room service appropriate? Yes  Diet effective now              EDUCATION NEEDS:   No education needs have been identified at this time  Skin:  Skin Assessment: Reviewed RN Assessment(MASD; groin)  Last BM:  12/25  Height:   Ht Readings from Last 1 Encounters:  05/10/19 5\' 5"  (1.651 m)    Weight:   Wt Readings from Last 1 Encounters:  05/10/19 106.6 kg    Ideal Body Weight:  52.3 kg  BMI:  Body mass index is 39.11 kg/m.  Estimated Nutritional Needs:   Kcal:  1900-2100  Protein:  95-110  Fluid:  >/= 1.8 L/day   Lajuan Lines, RD, LDN Clinical Nutrition [facility] Telephone After Hours/Weekend Pager: [phone]

## 2019-05-14 ENCOUNTER — Encounter (HOSPITAL_COMMUNITY): Payer: Self-pay | Admitting: Internal Medicine

## 2019-05-14 DIAGNOSIS — D696 Thrombocytopenia, unspecified: Secondary | ICD-10-CM

## 2019-05-14 DIAGNOSIS — I2699 Other pulmonary embolism without acute cor pulmonale: Secondary | ICD-10-CM

## 2019-05-14 DIAGNOSIS — I82403 Acute embolism and thrombosis of unspecified deep veins of lower extremity, bilateral: Secondary | ICD-10-CM

## 2019-05-14 LAB — BASIC METABOLIC PANEL
Anion gap: 11 (ref 5–15)
BUN: 29 mg/dL — ABNORMAL HIGH (ref 8–23)
CO2: 17 mmol/L — ABNORMAL LOW (ref 22–32)
Calcium: 9.1 mg/dL (ref 8.9–10.3)
Chloride: 100 mmol/L (ref 98–111)
Creatinine, Ser: 0.91 mg/dL (ref 0.44–1.00)
GFR calc Af Amer: >60 mL/min (ref >60)
GFR calc non Af Amer: >60 mL/min (ref >60)
Glucose, Bld: 227 mg/dL — ABNORMAL HIGH (ref 70–99)
Potassium: 4.8 mmol/L (ref 3.5–5.1)
Sodium: 128 mmol/L — ABNORMAL LOW (ref 135–145)

## 2019-05-14 LAB — GLUCOSE, CAPILLARY
Glucose-Capillary: 236 mg/dL — ABNORMAL HIGH (ref 70–99)
Glucose-Capillary: 273 mg/dL — ABNORMAL HIGH (ref 70–99)
Glucose-Capillary: 224 mg/dL — ABNORMAL HIGH (ref 70–99)
Glucose-Capillary: 318 mg/dL — ABNORMAL HIGH (ref 70–99)

## 2019-05-14 LAB — CBC
HCT: 31.7 % — ABNORMAL LOW (ref 36.0–46.0)
Hemoglobin: 10.2 g/dL — ABNORMAL LOW (ref 12.0–15.0)
MCH: 27.4 pg (ref 26.0–34.0)
MCHC: 32.2 g/dL (ref 30.0–36.0)
MCV: 85.2 fL (ref 80.0–100.0)
Platelets: 148 10*3/uL — ABNORMAL LOW (ref 150–400)
RBC: 3.72 MIL/uL — ABNORMAL LOW (ref 3.87–5.11)
RDW: 20.2 % — ABNORMAL HIGH (ref 11.5–15.5)
WBC: 12.8 10*3/uL — ABNORMAL HIGH (ref 4.0–10.5)
nRBC: 0.2 % (ref 0.0–0.2)

## 2019-05-14 LAB — PROTIME-INR
INR: 1.2 (ref 0.8–1.2)
Prothrombin Time: 14.8 seconds (ref 11.4–15.2)

## 2019-05-14 LAB — APTT: aPTT: 32 seconds (ref 24–36)

## 2019-05-14 MED ORDER — HEPARIN (PORCINE) 25000 UT/250ML-% IV SOLN
1550.0000 [IU]/h | INTRAVENOUS | Status: DC
  Administered 2019-05-14: 1000 [IU]/h via INTRAVENOUS
  Administered 2019-05-15: 1200 [IU]/h via INTRAVENOUS
  Administered 2019-05-15: 1450 [IU]/h via INTRAVENOUS
  Administered 2019-05-16 (×2): 1600 [IU]/h via INTRAVENOUS
  Administered 2019-05-18: 1500 [IU]/h via INTRAVENOUS
  Administered 2019-05-19: 1550 [IU]/h via INTRAVENOUS
  Filled 2019-05-14 (×7): qty 250

## 2019-05-14 MED ORDER — MILK AND MOLASSES ENEMA
1.0000 | Freq: Once | RECTAL | Status: AC
  Administered 2019-05-14: 240 mL via RECTAL
  Filled 2019-05-14: qty 240

## 2019-05-14 MED ORDER — POLYETHYLENE GLYCOL 3350 17 G PO PACK
17.0000 g | PACK | Freq: Two times a day (BID) | ORAL | Status: DC
  Administered 2019-05-14 – 2019-05-20 (×5): 17 g via ORAL
  Filled 2019-05-14 (×7): qty 1

## 2019-05-14 NOTE — Consult Note (Addendum)
[facility]  [phone] Fax:([phone]    INITIAL HEMATOLOGY CONSULTATION  Referring MD:  Dr. Brendia Sacksaniel Goodrich  Reason for Referral: Extensive, progressive bilateral lower extremity DVT, history of recent retroperitoneal bleed while on heparin  HPI: Ms. 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 The patient was [facility] when she was hospitalized for COVID-19 pneumonia. She tested positive for COVID-19 on 04/15/2019. During her last hospitalization, she developed worsening hypoxemia on ambulation and a CT angiogram of the chest performed on 04/24/2019 showed massive pulmonary embolus within both main pulmonary arteries extending into all proximal lobar branches with CT evidence of right heart strain which is consistent with at least submassive PE. A Doppler ultrasound of the bilateral lower extremities was performed on 04/27/2019 which was negative for DVT on the right and findings were consistent with age indeterminant DVT involving the left posterior tibial veins, and left peroneal veins. She was started on a heparin drip and subsequently developed left lower quadrant abdominal pain. A CT of the abdomen and pelvis was performed on 04/27/2019 which showed a spontaneous left pelvic hematoma. She developed significant anemia with a hemoglobin dropped down to 6.7 and she received 2 units PRBCs. IR was consulted and IVC filter was placed on 04/29/2019.  The patient returned to the hospital on 05/11/2019 due to generalized weakness. She was also complaining of worsening swelling for at least 2 days prior to admission with shortness of breath after ambulating only 30 feet. On arrival to the ER, patient was tachycardic and tachypneic. Work-up in the emergency room showed an  elevated white blood cell count of 16.6 and elevated lactic acid of 4.0. Chest x-ray showed mildly increased airspace opacity at the left lung base due to atelectasis versus infectious etiology. She was started on IV antibiotics. She had a repeat Doppler ultrasound of the bilateral lower extremities which showed significant progression of bilateral lower extremity DVT with occlusive thrombus throughout both lower extremities, in the setting of an indwelling IVC filter there would potentially be some concern of additional iliac and IVC thrombosis extending to the level of the filter. The patient has been seen by vascular surgery who has recommended against intervention with in the absence of anticoagulation. The case has been discussed with interventional radiology who will make further recommendations later today.  When seen today, the patient's husband is at the bedside.  She reports ongoing fatigue.  Denies recent fever or chills.  Denies headaches and vision changes.  Denies chest discomfort and shortness of breath.  Still has no intermittent nonproductive cough.  She denies abdominal pain, nausea, vomiting, constipation, diarrhea.  She has not noticed any bleeding such as bleeding gums, epistaxis, hematemesis, hemoptysis, hematuria, melena, hematochezia.  The patient has had knee and hip surgeries in the past and was placed on anticoagulation with no difficulty with bleeding.  Denies family history of blood clot such as DVT or PE.  Hematology was asked see the patient to make recommendations regarding anticoagulation.   Past Medical History:  Diagnosis Date  . Anxiety   . Arthritis   . Asthma   . Complication of anesthesia   . COVID-19   . Diabetes mellitus without complication (HCC)   . Dysrhythmia    palpitations ->20 yrs stress test neg nothing since  . GERD (gastroesophageal reflux disease)   .  Headache   . PONV (postoperative nausea and vomiting)   :    Past Surgical History:    Procedure Laterality Date  . ABDOMINAL HYSTERECTOMY    . ANTERIOR CERVICAL DECOMP/DISCECTOMY FUSION N/A 02/17/2016   Procedure: ANTERIOR CERVICAL DECOMPRESSION/DISCECTOMY FUSION 1 LEVEL C4-5;  Surgeon: Venita Lick, MD;  Location: [facility] OR;  Service: Orthopedics;  Laterality: N/A;  . EYE SURGERY Bilateral    cataracts  . HIP CLOSED REDUCTION Right 12/24/2012   Procedure: CLOSED REDUCTION HIP;  Surgeon: Emilee Hero, MD;  Location: [facility] ORS;  Service: Orthopedics;  Laterality: Right;  . IR IVC FILTER PLMT / S&I Lenise Arena GUID/MOD SED  04/29/2019  . JOINT REPLACEMENT  2002   right and left knees  . SHOULDER ARTHROSCOPY Left   . TOTAL HIP ARTHROPLASTY Right    2002  :   CURRENT MEDS: Current Facility-Administered Medications  Medication Dose Route Frequency Provider Last Rate Last Admin  . 0.9 %  sodium chloride infusion   Intravenous Continuous Standley Brooking, MD 50 mL/hr at 05/13/19 0156 New Bag at 05/13/19 0156  . albuterol (VENTOLIN HFA) 108 (90 Base) MCG/ACT inhaler 2 puff  2 puff Inhalation Q4H PRN Adefeso, Oladapo, DO      . ceFEPIme (MAXIPIME) 2 g in sodium chloride 0.9 % 100 mL IVPB  2 g Intravenous Q12H Titus Mould, RPH 200 mL/hr at 05/14/19 0322 2 g at 05/14/19 0322  . dextromethorphan-guaiFENesin (MUCINEX DM) 30-600 MG per 12 hr tablet 1 tablet  1 tablet Oral BID Adefeso, Oladapo, DO   1 tablet at 05/14/19 0901  . feeding supplement (GLUCERNA SHAKE) (GLUCERNA SHAKE) liquid 237 mL  237 mL Oral TID BM Standley Brooking, MD   237 mL at 05/13/19 2120  . insulin aspart (novoLOG) injection 0-15 Units  0-15 Units Subcutaneous TID WC Adefeso, Oladapo, DO   5 Units at 05/14/19 1610  . insulin aspart (novoLOG) injection 0-5 Units  0-5 Units Subcutaneous QHS Adefeso, Oladapo, DO   2 Units at 05/13/19 2115  . ondansetron (ZOFRAN) injection 4 mg  4 mg Intravenous Q6H PRN Bodenheimer, Charles A, NP   4 mg at 05/11/19 2004  . pantoprazole (PROTONIX) EC tablet 40 mg  40 mg Oral Daily  Adefeso, Oladapo, DO   40 mg at 05/14/19 0901  . polyethylene glycol (MIRALAX / GLYCOLAX) packet 17 g  17 g Oral BID Standley Brooking, MD      . senna-docusate (Senokot-S) tablet 2 tablet  2 tablet Oral BID Rodin Singer, MD   2 tablet at 05/14/19 0900  . umeclidinium bromide (INCRUSE ELLIPTA) 62.5 MCG/INH 1 puff  1 puff Inhalation Daily Adefeso, Oladapo, DO   Stopped at 05/14/19 0800      No Known Allergies:  History reviewed. No pertinent family history.:  Social History   Socioeconomic History  . Marital status: Married    Spouse name: Not on file  . Number of children: Not on file  . Years of education: Not on file  . Highest education level: Not on file  Occupational History  . Not on file  Tobacco Use  . Smoking status: Never Smoker  . Smokeless tobacco: Never Used  Substance and Sexual Activity  . Alcohol use: No  . Drug use: No  . Sexual activity: Not on file  Other Topics Concern  . Not on file  Social History Narrative  . Not on file   Social Determinants of Health   Financial Resource Strain: Low  Risk   . Difficulty of Paying Living Expenses: Not hard at all  Food Insecurity: No Food Insecurity  . Worried About Programme researcher, broadcasting/film/video in the Last Year: Never true  . Ran Out of Food in the Last Year: Never true  Transportation Needs: No Transportation Needs  . Lack of Transportation (Medical): No  . Lack of Transportation (Non-Medical): No  Physical Activity: Unknown  . Days of Exercise per Week: 1 day  . Minutes of Exercise per Session: Not on file  Stress:   . Feeling of Stress : Not on file  Social Connections:   . Frequency of Communication with Friends and Family: Not on file  . Frequency of Social Gatherings with Friends and Family: Not on file  . Attends Religious Services: Not on file  . Active Member of Clubs or Organizations: Not on file  . Attends Banker Meetings: Not on file  . Marital Status: Not on file  Intimate Partner  Violence:   . Fear of Current or Ex-Partner: Not on file  . Emotionally Abused: Not on file  . Physically Abused: Not on file  . Sexually Abused: Not on file  :  REVIEW OF SYSTEMS: A comprehensive 14 point review of systems was negative except as noted in the HPI.  Exam: Patient Vitals for the past 24 hrs:  BP Temp Temp src Pulse Resp SpO2  05/14/19 1125 (!) 118/56 98.4 F (36.9 C) Oral (!) 101 20 98 %  05/14/19 0600 (!) 111/47 -- -- -- (!) 24 --  05/14/19 0436 116/63 98.6 F (37 C) Oral (!) 106 20 94 %  05/13/19 1957 110/61 98.1 F (36.7 C) Oral (!) 113 (!) 24 95 %  05/13/19 1400 119/64 -- -- (!) 105 (!) 23 98 %    General:  well-nourished in no acute distress.   Eyes: PERRL, no scleral icterus.   ENT:  There were no oropharyngeal lesions.   Neck was without thyromegaly.   Lymphatics:  Negative cervical, supraclavicular or axillary adenopathy.  Respiratory: lungs were clear bilaterally without wheezing or crackles.  Cardiovascular:  Regular rate and rhythm, S1/S2, without murmur, rub or gallop.  3+ bilateral lower extremity edema with some tenderness with palpation on the right.  No tenderness reported on the left.  2+ pedal pulses bilaterally. GI:  abdomen was soft, flat, nontender, nondistended, without organomegaly.   Musculoskeletal: Moves all extremities x4 Skin exam was without ecchymosis, petechiae.   Neuro exam was nonfocal. Patient was alert and oriented.  Attention was good.   Language was appropriate.  Mood was normal without depression.  Speech was not pressured.  Thought content was not tangential.    LABS:  Lab Results  Component Value Date   WBC 12.8 (H) 05/14/2019   HGB 10.2 (L) 05/14/2019   HCT 31.7 (L) 05/14/2019   PLT 148 (L) 05/14/2019   GLUCOSE 227 (H) 05/14/2019   TRIG 123 04/21/2019   ALT 13 05/12/2019   AST 17 05/12/2019   NA 128 (L) 05/14/2019   K 4.8 05/14/2019   CL 100 05/14/2019   CREATININE 0.91 05/14/2019   BUN 29 (H) 05/14/2019   CO2 17  (L) 05/14/2019   INR 1.4 (H) 04/27/2019   HGBA1C 7.8 (H) 04/22/2019    CT ABDOMEN PELVIS WO CONTRAST  Result Date: 04/28/2019 CLINICAL DATA:  Follow-up of retroperitoneal hematoma. Pulmonary emboli. EXAM: CT ABDOMEN AND PELVIS WITHOUT CONTRAST TECHNIQUE: Multidetector CT imaging of the abdomen and pelvis was performed following  the standard protocol without IV contrast. COMPARISON:  04/27/2019 FINDINGS: Lower chest: There is a hazy area of density at the right lung base posteriorly which has progressed, consistent with a pulmonary infarct. There is slight increased atelectasis at the left lung base posteriorly. Heart size is normal. Hepatobiliary: Prominent left lobe of the liver with a nodular contour of the liver suggesting cirrhosis. Multiple gallstones. No dilated bile ducts. Pancreas: Unremarkable. No pancreatic ductal dilatation or surrounding inflammatory changes. Spleen: Normal in size without focal abnormality. Adrenals/Urinary Tract: The adrenal glands and kidneys are normal. No hydronephrosis. The bladder is markedly compressed by the large pelvic hematoma. Stomach/Bowel: Stomach is within normal limits. Appendix is not visualized. No evidence of bowel wall thickening, distention, or inflammatory changes. The pelvic hematoma has a mass effect upon the adjacent bowel. Vascular/Lymphatic: No significant vascular findings are present. No enlarged abdominal or pelvic lymph nodes. Reproductive: Status post hysterectomy. No adnexal masses. Other: The large pelvic hematoma is essentially unchanged since the prior study. There is minimally more prominent hemorrhage in the mesentery just above the pelvic hematoma. The hematoma appears to arise from the inferior aspect of the left rectus muscle. Musculoskeletal: No acute or significant osseous findings. IMPRESSION: 1. No significant change in the large pelvic hematoma. The hematoma appears to arise from the inferior aspect of the left rectus muscle. 2.  Slightly more prominent but slight a hemorrhage in the mesentery just above the pelvic hematoma. 3. 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 4. Probable cirrhosis. 5. Cholelithiasis. Electronically Signed   By: Francene Boyers M.D.   On: 04/28/2019 14:44   CT ANGIO CHEST PE W OR WO CONTRAST  Result Date: 04/24/2019 CLINICAL DATA:  Hypoxemia, cough and shortness of breath. COVID-19 pneumonia. EXAM: CT ANGIOGRAPHY CHEST WITH CONTRAST TECHNIQUE: Multidetector CT imaging of the chest was performed using the standard protocol during bolus administration of intravenous contrast. Multiplanar CT image reconstructions and MIPs were obtained to evaluate the vascular anatomy. CONTRAST:  75mL OMNIPAQUE IOHEXOL 350 MG/ML SOLN COMPARISON:  None. FINDINGS: Cardiovascular: --Pulmonary arteries: Contrast injection is sufficient to demonstrate satisfactory opacification of the pulmonary arteries to the segmental level, with attenuation of at least 200 HU at the main pulmonary artery. There is a large pulmonary embolus clot burden within both the right and left main pulmonary arteries extending into all proximal lobar branches. There is evidence of right heart strain with an RV to LV ratio of 1.65. the main pulmonary artery is within normal limits for size. --Aorta: Satisfactory opacification of the thoracic aorta. No aortic dissection or other acute aortic syndrome. Conventional 3 vessel aortic branching pattern. There is no aortic atherosclerosis. --Heart: Normal size. No pericardial effusion. Mediastinum/Nodes: No mediastinal, hilar or axillary lymphadenopathy. The visualized thyroid and thoracic esophageal course are unremarkable. Lungs/Pleura: Mild ground-glass opacity without focal consolidation. Upper Abdomen: Contrast bolus timing is not optimized for evaluation of the abdominal organs. There are stones within the gallbladder. Musculoskeletal: No chest wall abnormality. No bony spinal canal stenosis. Review of  the MIP images confirms the above findings. IMPRESSION: 1. Massive pulmonary embolus within both main pulmonary arteries extending into all proximal lobar branches with CT evidence of right heart strain (RV/LV Ratio = 1.65) consistent with at least submassive (intermediate risk) PE. The presence of right heart strain has been associated with an increased risk of morbidity and mortality. Please activate Code PE by paging [phone]. 2. Multifocal pulmonary ground-glass opacity without specific evidence of infection 3. Cholelithiasis. Critical Value/emergent results were  called by telephone at the time of interpretation on 04/24/2019 at 9:38 pm to provider K. Craige Cotta, who verbally acknowledged these results. Electronically Signed   By: Deatra Robinson M.D.   On: 04/24/2019 21:38   CT ABDOMEN PELVIS W CONTRAST  Result Date: 05/12/2019 CLINICAL DATA:  Lower abdominal pain, pelvic discomfort and constipation for 1 week, fatigue, history type II diabetes mellitus, GERD EXAM: CT ABDOMEN AND PELVIS WITH CONTRAST TECHNIQUE: Multidetector CT imaging of the abdomen and pelvis was performed using the standard protocol following bolus administration of intravenous contrast. Sagittal and coronal MPR images reconstructed from axial data set. CONTRAST:  80mL OMNIPAQUE IOHEXOL 300 MG/ML SOLN IV. No oral contrast. COMPARISON:  04/28/2019 FINDINGS: Lower chest: Mild bibasilar atelectasis Hepatobiliary: Nodular cirrhotic appearing liver without focal mass. Elongated lateral segment LEFT lobe extending to LEFT lateral abdominal wall. Multiple calcified gallstones dependently in gallbladder. No gallbladder wall thickening or pericholecystic infiltration by CT. Pancreas: Normal appearance Spleen: Normal appearance Adrenals/Urinary Tract: Adrenal glands, kidneys, and proximal ureters normal appearance. Distal ureters and bladder obscured due to a combination of large pelvic mass/collection and beam hardening artifacts from RIGHT hip  prosthesis. Stomach/Bowel: Stomach decompressed. Bowel loops unremarkable. Short segment of probable normal appendix visualized. Vascular/Lymphatic: IVC filter. Aorta normal caliber. Vascular structures patent. No adenopathy. Reproductive: Uterus surgically absent by history. Nonvisualization of ovaries. Other: Large collection identified within the pelvis, 15.6 x 16.9 x 10.0 cm consistent with large hematoma identified on previous exam. This displaces bowel and compresses urinary bladder. Collection extends into the LEFT rectus abdominus muscle question origin of hemorrhage. Overall little change in appearance since prior exams. No free air. No hernia. Musculoskeletal: Osseous structures unremarkable. IMPRESSION: No significant interval change in size of large pelvic hematoma, 15.6 x 16.9 x 10.0 cm, displacing bowel and compressing bladder; this likely originates from the LEFT rectus abdominus muscle. Probable cirrhotic liver. Cholelithiasis. Electronically Signed   By: Ulyses Southward M.D.   On: 05/12/2019 14:59   IR IVC FILTER PLMT / S&I Lenise Arena GUID/MOD SED  Result Date: 04/29/2019 INDICATION: History of COVID-19 infection complicated by pulmonary embolism and lower extremity DVT further complicated by development of a retroperitoneal hematoma following the initiation anticoagulation. As such, patient is no longer a candidate for anticoagulation the given presence of lower extremity DVT, request made for placement of an IVC filter for temporary caval interruption purposes. EXAM: ULTRASOUND GUIDANCE FOR VASCULAR ACCESS IVC CATHETERIZATION AND VENOGRAM IVC FILTER INSERTION COMPARISON:  CT abdomen pelvis-04/28/2019 MEDICATIONS: None. ANESTHESIA/SEDATION: Fentanyl 50 mcg IV; Versed 1 mg IV Sedation Time: 10 minutes; The patient was continuously monitored during the procedure by the interventional radiology nurse under my direct supervision. CONTRAST:  40 cc Isovue-300 FLUOROSCOPY TIME:  2 minutes, 30 seconds (31.5  mGy) COMPLICATIONS: None immediate PROCEDURE: Informed consent was obtained from the patient following explanation of the procedure, risks, benefits and alternatives. The patient understands, agrees and consents for the procedure. All questions were addressed. A time out was performed prior to the initiation of the procedure. Maximal barrier sterile technique utilized including caps, mask, sterile gowns, sterile gloves, large sterile drape, hand hygiene, and Betadine prep. Under sterile condition and local anesthesia, right internal jugular venous access was performed with ultrasound. An ultrasound image was saved and sent to PACS. Over a guidewire, the IVC filter delivery sheath and inner dilator were advanced into the IVC just above the IVC bifurcation. Contrast injection was performed for an IVC venogram. Through the delivery sheath, a retrievable Denali IVC filter was  deployed below the level of the renal veins and above the IVC bifurcation. Limited post deployment venacavagram was performed. The delivery sheath was removed and hemostasis was obtained with manual compression. A dressing was placed. The patient tolerated the procedure well without immediate post procedural complication. FINDINGS: The IVC is patent. No evidence of thrombus, stenosis, or occlusion. No variant venous anatomy. Successful placement of the IVC filter below the level of the renal veins. Incidentally noted laminated gallstones overlying the right upper abdominal quadrant as demonstrated on preceding abdominal CT. IMPRESSION: Successful ultrasound and fluoroscopically guided placement of an infrarenal retrievable IVC filter via right jugular approach. PLAN: This IVC filter is potentially retrievable. The patient will be assessed for filter retrieval by Interventional Radiology in approximately 8-12 weeks. Further recommendations regarding filter retrieval, continued surveillance or declaration of device permanence, will be made at that  time. Electronically Signed   By: Simonne Come M.D.   On: 04/29/2019 17:51   US Venous Img Lower Bilateral (DVT)  Addendum Date: 05/12/2019   ADDENDUM REPORT: 05/12/2019 13:23 ADDENDUM: Findings were discussed with Dr. Karilyn Cota by phone. Electronically Signed   By: Irish Lack M.D.   On: 05/12/2019 13:23   Result Date: 05/12/2019 CLINICAL DATA:  History of recent COVID-19 infection, bilateral pulmonary embolism and left lower extremity DVT. IVC filter was placed on 04/29/2019 due to development of a large spontaneous pelvic hematoma after anticoagulation. Development of increased lower extremity edema, left greater than right, and pain. EXAM: BILATERAL LOWER EXTREMITY VENOUS DOPPLER ULTRASOUND TECHNIQUE: Gray-scale sonography with graded compression, as well as color Doppler and duplex ultrasound were performed to evaluate the lower extremity deep venous systems from the level of the common femoral vein and including the common femoral, femoral, profunda femoral, popliteal and calf veins including the posterior tibial, peroneal and gastrocnemius veins when visible. The superficial great saphenous vein was also interrogated. Spectral Doppler was utilized to evaluate flow at rest and with distal augmentation maneuvers in the common femoral, femoral and popliteal veins. COMPARISON:  None. FINDINGS: RIGHT LOWER EXTREMITY Common Femoral Vein: Occlusive thrombus present. Saphenofemoral Junction: Some thrombus appears to extend just into the upper segment of the GSV. Profunda Femoral Vein: Occlusive thrombus present. Femoral Vein: Occlusive thrombus present. Popliteal Vein: Occlusive thrombus present. Calf Veins: Occlusive tibial thrombus in the visualized calf veins. Superficial Great Saphenous Vein: Thrombus visualized in the upper GSV in the thigh. Venous Reflux:  None. Other Findings:  None. LEFT LOWER EXTREMITY Common Femoral Vein: Occlusive thrombus present. Saphenofemoral Junction: Thrombus extends into  the GSV. Profunda Femoral Vein: Occlusive thrombus present. Femoral Vein: Occlusive thrombus present. Popliteal Vein: Occlusive thrombus present. Calf Veins: Occlusive thrombus present. Superficial Great Saphenous Vein: Thrombus visualized in the upper GSV in the thigh. Venous Reflux:  None. Other Findings:  None. IMPRESSION: Significant progression of bilateral lower extremity DVT with occlusive thrombus throughout both lower extremities. In the setting of an indwelling IVC filter, there would potentially be some concern of additional iliac and IVC thrombosis extending to the level of the filter. Consider CT venography of the abdomen and pelvis with contrast for further evaluation. Electronically Signed: By: Irish Lack M.D. On: 05/12/2019 13:09   DG Chest Port 1 View  Result Date: 05/11/2019 CLINICAL DATA:  Shortness of breath EXAM: PORTABLE CHEST 1 VIEW COMPARISON:  April 24, 2019 FINDINGS: The heart size and mediastinal contours are within normal limits. Mildly increased hazy airspace opacity seen at the left lung base. The visualized skeletal structures are unremarkable. IMPRESSION:  Mildly increased hazy airspace opacity at the left lung base which could be due to atelectasis and/or early infectious etiology. Electronically Signed   By: Jonna Clark M.D.   On: 05/11/2019 00:27   DG CHEST PORT 1 VIEW  Result Date: 04/24/2019 CLINICAL DATA:  68 year old with COVID-19 pneumonia, now with worsening hypoxia. EXAM: PORTABLE CHEST 1 VIEW COMPARISON:  04/21/2019. FINDINGS: Cardiac silhouette upper normal in size to mildly enlarged, unchanged. Improved aeration in the lung bases, with residual mild streaky opacities at the LEFT base. No new pulmonary parenchymal abnormalities. Normal pulmonary vascularity. No visible pleural effusions. IMPRESSION: 1. Improved aeration in the lung bases, with residual mild streaky atelectasis and/or bronchopneumonia at the LEFT base. 2. No new abnormalities. Electronically  Signed   By: Hulan Saas M.D.   On: 04/24/2019 17:07   DG Chest Port 1 View  Result Date: 04/21/2019 CLINICAL DATA:  68 year old female with shortness of breath. EXAM: PORTABLE CHEST 1 VIEW COMPARISON:  Chest radiograph report dated 02/28/2001. The images are not available for direct comparison. FINDINGS: Minimal bibasilar, left greater than right, densities likely atelectatic changes. Atypical infiltrate is less likely but not excluded. Clinical correlation is recommended. There is no focal consolidation, pleural effusion, pneumothorax. The cardiac silhouette is within normal limits. No acute osseous pathology. IMPRESSION: Minimal bibasilar atelectasis.  No focal consolidation. Electronically Signed   By: Elgie Collard M.D.   On: 04/21/2019 17:45   VAS [place] LOWER EXTREMITY VENOUS (DVT)  Result Date: 04/28/2019  Lower Venous Study Indications: Pulmonary embolism. Other Indications: Covid positive. Risk Factors: Large pelvic hematoma is noted which appears to originate from the. Limitations: Body habitus. Comparison Study: No prior study on file for comparison. Performing Technologist: Sherren Kerns RVS  Examination Guidelines: A complete evaluation includes B-mode imaging, spectral Doppler, color Doppler, and power Doppler as needed of all accessible portions of each vessel. Bilateral testing is considered an integral part of a complete examination. Limited examinations for reoccurring indications may be performed as noted.  +---------+---------------+---------+-----------+----------+--------------+ RIGHT    CompressibilityPhasicitySpontaneityPropertiesThrombus Aging +---------+---------------+---------+-----------+----------+--------------+ CFV      Full           Yes      Yes                                 +---------+---------------+---------+-----------+----------+--------------+ SFJ      Full                                                         +---------+---------------+---------+-----------+----------+--------------+ FV Prox  Full                                                        +---------+---------------+---------+-----------+----------+--------------+ FV Mid   Full                                                        +---------+---------------+---------+-----------+----------+--------------+ FV DistalFull                                                        +---------+---------------+---------+-----------+----------+--------------+  PFV      Full                                                        +---------+---------------+---------+-----------+----------+--------------+ POP      Full           Yes      Yes                                 +---------+---------------+---------+-----------+----------+--------------+ PTV      Full                                                        +---------+---------------+---------+-----------+----------+--------------+ PERO     Full                                                        +---------+---------------+---------+-----------+----------+--------------+   +---------+---------------+---------+-----------+----------+-----------------+ LEFT     CompressibilityPhasicitySpontaneityPropertiesThrombus Aging    +---------+---------------+---------+-----------+----------+-----------------+ CFV      Full           Yes      Yes                                    +---------+---------------+---------+-----------+----------+-----------------+ SFJ      Full                                                           +---------+---------------+---------+-----------+----------+-----------------+ FV Prox  Full                                                           +---------+---------------+---------+-----------+----------+-----------------+ FV Mid   Full                                                            +---------+---------------+---------+-----------+----------+-----------------+ FV DistalFull                                                           +---------+---------------+---------+-----------+----------+-----------------+ PFV      Full                                                           +---------+---------------+---------+-----------+----------+-----------------+  POP      Full           No       No                                     +---------+---------------+---------+-----------+----------+-----------------+ PTV      Full                                         Age Indeterminate +---------+---------------+---------+-----------+----------+-----------------+ PERO     Full                                         Age Indeterminate +---------+---------------+---------+-----------+----------+-----------------+     Summary: Right: There is no evidence of deep vein thrombosis in the lower extremity. Left: Findings consistent with age indeterminate deep vein thrombosis involving the left posterior tibial veins, and left peroneal veins.  *See table(s) above for measurements and observations. Electronically signed by Lemar Livings MD on 04/28/2019 at 10:55:03 AM.    Final    ECHOCARDIOGRAM LIMITED  Result Date: 04/25/2019   ECHOCARDIOGRAM LIMITED REPORT   Patient Name:   Regina Munoz Date of Exam: 04/25/2019 Medical Rec #:  [id number]         Height:       63.0 in Accession #:    [id number]        Weight:       246.5 lb Date of Birth:  [birth year]         BSA:          2.11 m Patient Age:    68 years          BP:           126/70 mmHg Patient Gender: F                 HR:           79 bpm. Exam Location:  Inpatient  Procedure: Limited Echo, Limited Color Doppler and Cardiac Doppler STAT ECHO Indications:    pulmonary embolus 415.19  History:        Patient has no prior history of Echocardiogram examinations.  Sonographer:    Delcie Roch Referring Phys: [id number]  CAROLE N HALL IMPRESSIONS  1. Left ventricular ejection fraction, by visual estimation, is 50 to 55%. The left ventricle has normal function. There is no left ventricular hypertrophy.  2. Left ventricular diastolic parameters are consistent with Grade I diastolic dysfunction (impaired relaxation).  3. The left ventricle has no regional wall motion abnormalities.  4. Global right ventricle has normal systolic function.The right ventricular size is normal.  5. Left atrial size was normal.  6. Right atrial size was normal.  7. The mitral valve is normal in structure. No evidence of mitral valve regurgitation. No evidence of mitral stenosis.  8. The tricuspid valve is normal in structure. Tricuspid valve regurgitation is trivial.  9. The aortic valve is tricuspid. Aortic valve regurgitation is not visualized. Mild aortic valve sclerosis without stenosis. 10. The pulmonic valve was normal in structure. Pulmonic valve regurgitation is not visualized. 11. Mildly elevated pulmonary artery systolic pressure. 12. The inferior vena cava is normal in size with greater than 50% respiratory  variability, suggesting right atrial pressure of 3 mmHg. 13. Low normal LV systolic function; grade 1 diastolic dysfunction; moderate RV dysfunction. FINDINGS  Left Ventricle: Left ventricular ejection fraction, by visual estimation, is 50 to 55%. The left ventricle has normal function. The left ventricle has no regional wall motion abnormalities. There is no left ventricular hypertrophy. Left ventricular diastolic parameters are consistent with Grade I diastolic dysfunction (impaired relaxation). Normal left atrial pressure. Right Ventricle: The right ventricular size is normal.Global RV systolic function is has normal systolic function. The tricuspid regurgitant velocity is 2.82 m/s, and with an assumed right atrial pressure of 3 mmHg, the estimated right ventricular systolic pressure is mildly elevated at 34.8 mmHg. Left Atrium: Left atrial  size was normal in size. Right Atrium: Right atrial size was normal in size Pericardium: There is no evidence of pericardial effusion. Mitral Valve: The mitral valve is normal in structure. No evidence of mitral valve stenosis by observation. MV Area by PHT, 4.31 cm. MV PHT, 51.04 msec. No evidence of mitral valve regurgitation. Tricuspid Valve: The tricuspid valve is normal in structure. Tricuspid valve regurgitation is trivial. Aortic Valve: The aortic valve is tricuspid. Aortic valve regurgitation is not visualized. Mild aortic valve sclerosis is present, with no evidence of aortic valve stenosis. Pulmonic Valve: The pulmonic valve was normal in structure. Pulmonic valve regurgitation is not visualized. Aorta: The aortic root is normal in size and structure. Venous: The inferior vena cava is normal in size with greater than 50% respiratory variability, suggesting right atrial pressure of 3 mmHg.  LEFT VENTRICLE          Normals PLAX 2D LVIDd:         4.10 cm  3.6 cm   Diastology                 Normals LVIDs:         3.20 cm  1.7 cm   LV e' lateral:   8.27 cm/s 6.42 cm/s LV PW:         0.90 cm  1.4 cm   LV E/e' lateral: 8.4       15.4 LV IVS:        1.00 cm  1.3 cm   LV e' medial:    6.85 cm/s 6.96 cm/s LVOT diam:     1.90 cm  2.0 cm   LV E/e' medial:  10.2      6.96 LV SV:         33 ml    79 ml LV SV Index:   14.51    45 ml/m2 LVOT Area:     2.84 cm 3.14 cm2  RIGHT VENTRICLE RV S prime:     15.60 cm/s TAPSE (M-mode): 2.1 cm LEFT ATRIUM         Index LA diam:    3.20 cm 1.51 cm/m  AORTIC VALVE             Normals LVOT Vmax:   78.30 cm/s LVOT Vmean:  46.200 cm/s 75 cm/s LVOT VTI:    0.134 m     25.3 cm  AORTA                 Normals Ao Root diam: 3.10 cm 31 mm MITRAL VALVE              Normals   TRICUSPID VALVE             Normals MV Area (PHT): 4.31 cm  TR Peak grad:   31.8 mmHg MV PHT:        51.04 msec 55 ms     TR Vmax:        282.00 cm/s 288 cm/s MV Decel Time: 176 msec   187 ms MV E velocity:  69.80 cm/s 103 cm/s  SHUNTS MV A velocity: 78.80 cm/s 70.3 cm/s Systemic VTI:  0.13 m MV E/A ratio:  0.89       1.5       Systemic Diam: 1.90 cm  Olga Millers MD Electronically signed by Olga Millers MD Signature Date/Time: 04/25/2019/3:06:36 PM    Final    CT Angio Abd/Pel w/ and/or w/o  Result Date: 04/27/2019 CLINICAL DATA:  Acute generalized abdominal pain. EXAM: CTA ABDOMEN AND PELVIS WITHOUT AND WITH CONTRAST TECHNIQUE: Multidetector CT imaging of the abdomen and pelvis was performed using the standard protocol during bolus administration of intravenous contrast. Multiplanar reconstructed images and MIPs were obtained and reviewed to evaluate the vascular anatomy. CONTRAST:  OMNIPAQUE IOHEXOL 350 MG/ML SOLN COMPARISON:  None. FINDINGS: VASCULAR Aorta: Normal caliber aorta without aneurysm, dissection, vasculitis or significant stenosis. Celiac: Patent without evidence of aneurysm, dissection, vasculitis or significant stenosis. SMA: Patent without evidence of aneurysm, dissection, vasculitis or significant stenosis. Renals: Both renal arteries are patent without evidence of aneurysm, dissection, vasculitis, fibromuscular dysplasia or significant stenosis. IMA: Patent without evidence of aneurysm, dissection, vasculitis or significant stenosis. Inflow: Patent without evidence of aneurysm, dissection, vasculitis or significant stenosis. Proximal Outflow: Bilateral common femoral and visualized portions of the superficial and profunda femoral arteries are patent without evidence of aneurysm, dissection, vasculitis or significant stenosis. Veins: No obvious venous abnormality within the limitations of this arterial phase study. Review of the MIP images confirms the above findings. NON-VASCULAR Lower chest: No acute abnormality. Hepatobiliary: Cholelithiasis is noted. No biliary dilatation is noted. Minimally nodular hepatic margins are noted suggesting possible early hepatic cirrhosis. Pancreas:  Unremarkable. No pancreatic ductal dilatation or surrounding inflammatory changes. Spleen: Normal in size without focal abnormality. Adrenals/Urinary Tract: Adrenal glands and kidneys appear normal. No hydronephrosis is noted. Urinary bladder appears to be compressed by large pelvic hematoma. Stomach/Bowel: The stomach appears normal. There is no evidence of bowel obstruction or inflammation. The appendix is not visualized. Lymphatic: No adenopathy is noted. Reproductive: Status post hysterectomy. No adnexal masses. Other: There is a large complex lobulated density in the left in anterior portion of the pelvis which appears to arise from inferior portion of left rectal sheath, most consistent with hemorrhage. There is noted some hyperdensity along the medial portion of the left inferior rectus sheath which may represent contrast extravasation and possible active hemorrhage. Musculoskeletal: No acute or significant osseous findings. IMPRESSION: VASCULAR Large pelvic hematoma is noted which appears to originate from the inferior portion of the left rectus muscle, most consistent with spontaneous hemorrhage, potentially related to anticoagulation. This causes displacement and compression of the urinary bladder. There is seen some irregular high density along the medial portion of the left inferior rectus muscle which may represent contrast extravasation and possibly active hemorrhage. Critical Value/emergent results were called by telephone at the time of interpretation on 04/27/2019 at 5:05 pm to providerCAROLE HALL , who verbally acknowledged these results. Mild cholelithiasis is noted. Minimally nodular hepatic margins are noted suggesting possible hepatic cirrhosis. Electronically Signed   By: Lupita Raider M.D.   On: 04/27/2019 17:14    ASSESSMENT AND PLAN:  1. Extensive, progressive bilateral lower extremity DVT 2. Recent  diagnosis of submassive bilateral PE 3. Retroperitoneal hematoma that occurred  spontaneously while on heparin 4. Recent COVID-19 pneumonia 5.  Mild thrombocytopenia 6.  Mild normocytic anemia  -The patient has developed a recent PE and bilateral DVT.  She is likely hypercoagulable secondary to recent COVID-19 infection.  Unfortunately, she developed a retroperitoneal hematoma when placed on heparin and anticoagulation was stopped and an IVC filter was placed.  She has now developed significant progression of the bilateral lower extremity DVT with occlusive thrombus throughout both lower extremities. -The patient has been seen by vascular surgery who does not recommend any vascular surgical procedure at this point in time. -Repeat CT of the abdomen pelvis shows a stable large pelvic hematoma compared with prior CT on 04/28/2019 (over 2 weeks ago). -Discussed with the patient and her husband the continued increased risk for more clot formation.  We discussed the risks and benefits of restarting anticoagulation with heparin.  We also discussed the results of a retrospective study with resuming warfarin in the setting of bleeding.  Results showed that there was a slight increased risk for bleeding with restarting the anticoagulation, but these results were not statistically significant.  At this point, it would seem that the benefits of anticoagulation would outweigh the risks.  We discussed that we would monitor the patient very closely for any signs of bleeding.  The patient and her husband will think about this but seem to be leaning towards starting back on anticoagulation. -She has mild anemia and thrombocytopenia likely due to recent COVID-19 infection/SIRS.  Recommend close monitoring of CBC. -Discussed recommendations with hospitalist.   Thank you for this referral.  Clenton Pare, DNP, AGPCNP-BC, AOCNP  Attending Note  I personally saw the patient, reviewed the chart and examined the patient.  I agree with the assessment and plan as documented above. Thank you very  much for the consultation. Extensive bilateral lower extremity DVT and bilateral PEs: Heparin treatment led to retroperitoneal hematoma.  Because of worsening thromboembolic disease patient has been readmitted.  I support reinitiating anticoagulation with heparin and communicated this to Dr. Irene Limbo.  Unfortunately the risks of thromboembolic disease far outweighed the risk of bleeding. The cause of the retroperitoneal hematoma is unclear.  With no prior history of bleeding disorders, there is no clinical suspicion for hemophilia.  Testing for such coagulation disorders will be very confusing in the setting of severe thromboembolic disease and a recent bleed.  Therefore no such work-up will be done at this time. However PT PTT INR were normal suggesting no major clotting factor deficiency at this time.  We will follow along.

## 2019-05-14 NOTE — Progress Notes (Signed)
Patient produced 1 small soft stool on BSC.   Enema given.  Tolerated well.  Produced 1 small soft stool.

## 2019-05-14 NOTE — Consult Note (Signed)
Chief Complaint: Patient was seen in consultation today for possible thrombectomy.  Referring Physician(s): Dr. Sarajane Jews  Supervising Physician: Sandi Mariscal  Patient Status: [facility] - In-pt  History of Present Illness: Regina Munoz is a 68 y.o. female with a past medical history significant for anxiety, arthritis, GERD, DM, spontaneous retroperitoneal hematoma development while on anticoagulation and recent COVID-19 infection with DVT/PE development s/p IVC filter placement in IR on 04/29/19 who presented to [facility] ED on 12/25 with complaints of nausea, weakness, poor PO intake, cough and lower extremity pain. Initial workup in the ED showed tachycardia, leukocytosis, left lower lobe opacification and lactic acidosis. She was admitted for IV abx and further workup. BLE [place] was performed due to complaints of lower extremity pain which showed significant progression of BLE DVT with occlusive thrombus throughout both lower extremities, concerning for additional iliac and IVC thrombosis extending to the level of the filter. She was transferred to [facility] for further care and vascular surgery was consulted. She was noted to have adequate lower extremity perfusion and was not deemed a candidate for surgical intervention at this time. IR has been consulted for possible thrombectomy as well as follow up of IVC filter which was previously placed in IR.   [name] was seen in her room with her husband at bedside. She reports that her lower extremity pain began around 12/22 - 12/23 and progressively worsened. She continues to have some lower extremity pain and feeling of heaviness in her legs. Her husband reports that her legs appear to be twice the size they usually are. She also reports constipation with last bowel movement being approximately 1-2 weeks ago. She denies any dyspnea, chest pain or abdominal pain currently. She and her husband are considering trying heparin again but are unsure currently if this is  the best option for them as they are concerned about severe bleeding and even death.   Past Medical History:  Diagnosis Date  . Anxiety   . Arthritis   . Asthma   . Complication of anesthesia   . COVID-19   . Diabetes mellitus without complication ([place])   . Dysrhythmia    palpitations ->20 yrs stress test neg nothing since  . GERD (gastroesophageal reflux disease)   . Headache   . PONV (postoperative nausea and vomiting)     Past Surgical History:  Procedure Laterality Date  . ABDOMINAL HYSTERECTOMY    . ANTERIOR CERVICAL DECOMP/DISCECTOMY FUSION N/A 02/17/2016   Procedure: ANTERIOR CERVICAL DECOMPRESSION/DISCECTOMY FUSION 1 LEVEL C4-5;  Surgeon: Melina Schools, MD;  Location: [facility];  Service: Orthopedics;  Laterality: N/A;  . EYE SURGERY Bilateral    cataracts  . HIP CLOSED REDUCTION Right 12/24/2012   Procedure: CLOSED REDUCTION HIP;  Surgeon: Sinclair Ship, MD;  Location: [facility];  Service: Orthopedics;  Laterality: Right;  . IR IVC FILTER PLMT / S&I Burke Keels GUID/MOD SED  04/29/2019  . JOINT REPLACEMENT  2002   right and left knees  . SHOULDER ARTHROSCOPY Left   . TOTAL HIP ARTHROPLASTY Right    2002    Allergies: Patient has no known allergies.  Medications: Prior to Admission medications   Medication Sig Start Date End Date Taking? Authorizing Provider  acetaminophen (TYLENOL) 500 MG tablet Take 1,000 mg by mouth every 6 (six) hours as needed for mild pain or moderate pain.    Yes [provider]  albuterol (VENTOLIN HFA) 108 (90 Base) MCG/ACT inhaler Inhale 2 puffs into the lungs every 4 (four) hours  as needed for wheezing or shortness of breath. 05/02/19  Yes Regalado, Belkys A, MD  ascorbic acid (VITAMIN C) 500 MG tablet Take 1 tablet (500 mg total) by mouth daily. 05/02/19  Yes Regalado, Belkys A, MD  blood glucose meter kit and supplies KIT Dispense based on patient and insurance preference. Use up to four times daily as directed. (FOR ICD-9 250.00,  250.01). 05/02/19  Yes Regalado, Belkys A, MD  buPROPion (ZYBAN) 150 MG 12 hr tablet Take 75 mg by mouth every morning.  03/28/19  Yes [provider]  cetirizine (ZYRTEC) 10 MG tablet Take 10 mg by mouth daily. 02/13/19  Yes [provider]  cholecalciferol (VITAMIN D) 1000 UNITS tablet Take 1,000 Units by mouth daily.   Yes [provider]  dextromethorphan-guaiFENesin (MUCINEX DM) 30-600 MG 12hr tablet Take 1 tablet by mouth 2 (two) times daily. 05/02/19  Yes Regalado, Belkys A, MD  diclofenac Sodium (VOLTAREN) 1 % GEL Apply 2 g topically daily as needed (for pain).    Yes [provider]  Insulin Glargine (LANTUS) 100 UNIT/ML Solostar Pen Inject 10 Units into the skin daily. 05/02/19  Yes Regalado, Belkys A, MD  Insulin Pen Needle (PEN NEEDLES 3/16") 31G X 5 MM MISC 1 application by Does not apply route daily. 05/02/19  Yes Regalado, Belkys A, MD  metFORMIN (GLUCOPHAGE) 500 MG tablet Take 500 mg by mouth 2 (two) times daily with a meal.    Yes [provider]  omeprazole (PRILOSEC) 20 MG capsule Take 20 mg by mouth daily.   Yes [provider]  polyethylene glycol (MIRALAX / GLYCOLAX) 17 g packet Take 17 g by mouth daily as needed for mild constipation. 05/02/19  Yes Regalado, Belkys A, MD  tiotropium (SPIRIVA HANDIHALER) 18 MCG inhalation capsule Place 1 capsule (18 mcg total) into inhaler and inhale 2 (two) times daily. 05/02/19  Yes Regalado, Belkys A, MD  zinc sulfate 220 (50 Zn) MG capsule Take 1 capsule (220 mg total) by mouth daily. 05/02/19  Yes Regalado, Cassie Freer, MD     History reviewed. No pertinent family history.  Social History   Socioeconomic History  . Marital status: Married    Spouse name: Not on file  . Number of children: Not on file  . Years of education: Not on file  . Highest education level: Not on file  Occupational History  . Not on file  Tobacco Use  . Smoking status: Never Smoker  . Smokeless tobacco:  Never Used  Substance and Sexual Activity  . Alcohol use: No  . Drug use: No  . Sexual activity: Not on file  Other Topics Concern  . Not on file  Social History Narrative  . Not on file   Social Determinants of Health   Financial Resource Strain: Low Risk   . Difficulty of Paying Living Expenses: Not hard at all  Food Insecurity: No Food Insecurity  . Worried About Charity fundraiser in the Last Year: Never true  . Ran Out of Food in the Last Year: Never true  Transportation Needs: No Transportation Needs  . Lack of Transportation (Medical): No  . Lack of Transportation (Non-Medical): No  Physical Activity: Unknown  . Days of Exercise per Week: 1 day  . Minutes of Exercise per Session: Not on file  Stress:   . Feeling of Stress : Not on file  Social Connections:   . Frequency of Communication with Friends and Family: Not on file  .  Frequency of Social Gatherings with Friends and Family: Not on file  . Attends Religious Services: Not on file  . Active Member of Clubs or Organizations: Not on file  . Attends Archivist Meetings: Not on file  . Marital Status: Not on file     Review of Systems: A 12 point ROS discussed and pertinent positives are indicated in the HPI above.  All other systems are negative.  Review of Systems  Constitutional: Positive for fatigue. Negative for chills and fever.  Respiratory: Negative for cough.   Cardiovascular: Positive for leg swelling. Negative for chest pain.  Gastrointestinal: Positive for constipation. Negative for abdominal pain, nausea and vomiting.  Neurological: Negative for headaches.    Vital Signs: BP (!) 118/56 (BP Location: Left Arm)   Pulse (!) 101   Temp 98.4 F (36.9 C) (Oral)   Resp 20   Ht _0  (1.651 m)   Wt 235 lb (106.6 kg)   SpO2 98%   BMI 39.11 kg/m   Physical Exam Vitals and nursing note reviewed.  Constitutional:      General: She is not in acute distress. HENT:     Head: Normocephalic.   Cardiovascular:     Rate and Rhythm: Tachycardia present.  Pulmonary:     Effort: Pulmonary effort is normal.  Musculoskeletal:     Right lower leg: Edema (TTP) present.     Left lower leg: Edema (TTP) present.  Skin:    General: Skin is warm and dry.     Findings: Bruising present. No rash.     Comments: BLE warm to touch, grossly neurovascularly intact  Neurological:     Mental Status: She is alert. Mental status is at baseline.  Psychiatric:        Mood and Affect: Mood normal.        Behavior: Behavior normal.        Thought Content: Thought content normal.        Judgment: Judgment normal.      Imaging: CT ABDOMEN PELVIS WO CONTRAST  Result Date: 04/28/2019 CLINICAL DATA:  Follow-up of retroperitoneal hematoma. Pulmonary emboli. EXAM: CT ABDOMEN AND PELVIS WITHOUT CONTRAST TECHNIQUE: Multidetector CT imaging of the abdomen and pelvis was performed following the standard protocol without IV contrast. COMPARISON:  04/27/2019 FINDINGS: Lower chest: There is a hazy area of density at the right lung base posteriorly which has progressed, consistent with a pulmonary infarct. There is slight increased atelectasis at the left lung base posteriorly. Heart size is normal. Hepatobiliary: Prominent left lobe of the liver with a nodular contour of the liver suggesting cirrhosis. Multiple gallstones. No dilated bile ducts. Pancreas: Unremarkable. No pancreatic ductal dilatation or surrounding inflammatory changes. Spleen: Normal in size without focal abnormality. Adrenals/Urinary Tract: The adrenal glands and kidneys are normal. No hydronephrosis. The bladder is markedly compressed by the large pelvic hematoma. Stomach/Bowel: Stomach is within normal limits. Appendix is not visualized. No evidence of bowel wall thickening, distention, or inflammatory changes. The pelvic hematoma has a mass effect upon the adjacent bowel. Vascular/Lymphatic: No significant vascular findings are present. No enlarged  abdominal or pelvic lymph nodes. Reproductive: Status post hysterectomy. No adnexal masses. Other: The large pelvic hematoma is essentially unchanged since the prior study. There is minimally more prominent hemorrhage in the mesentery just above the pelvic hematoma. The hematoma appears to arise from the inferior aspect of the left rectus muscle. Musculoskeletal: No acute or significant osseous findings. IMPRESSION: 1. No significant change in the  large pelvic hematoma. The hematoma appears to arise from the inferior aspect of the left rectus muscle. 2. Slightly more prominent but slight a hemorrhage in the mesentery just above the pelvic hematoma. 3. 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 4. Probable cirrhosis. 5. Cholelithiasis. Electronically Signed   By: Lorriane Shire M.D.   On: 04/28/2019 14:44   CT ANGIO CHEST PE W OR WO CONTRAST  Result Date: 04/24/2019 CLINICAL DATA:  Hypoxemia, cough and shortness of breath. COVID-19 pneumonia. EXAM: CT ANGIOGRAPHY CHEST WITH CONTRAST TECHNIQUE: Multidetector CT imaging of the chest was performed using the standard protocol during bolus administration of intravenous contrast. Multiplanar CT image reconstructions and MIPs were obtained to evaluate the vascular anatomy. CONTRAST:  72m OMNIPAQUE IOHEXOL 350 MG/ML SOLN COMPARISON:  None. FINDINGS: Cardiovascular: --Pulmonary arteries: Contrast injection is sufficient to demonstrate satisfactory opacification of the pulmonary arteries to the segmental level, with attenuation of at least 200 HU at the main pulmonary artery. There is a large pulmonary embolus clot burden within both the right and left main pulmonary arteries extending into all proximal lobar branches. There is evidence of right heart strain with an RV to LV ratio of 1.65. the main pulmonary artery is within normal limits for size. --Aorta: Satisfactory opacification of the thoracic aorta. No aortic dissection or other acute aortic syndrome.  Conventional 3 vessel aortic branching pattern. There is no aortic atherosclerosis. --Heart: Normal size. No pericardial effusion. Mediastinum/Nodes: No mediastinal, hilar or axillary lymphadenopathy. The visualized thyroid and thoracic esophageal course are unremarkable. Lungs/Pleura: Mild ground-glass opacity without focal consolidation. Upper Abdomen: Contrast bolus timing is not optimized for evaluation of the abdominal organs. There are stones within the gallbladder. Musculoskeletal: No chest wall abnormality. No bony spinal canal stenosis. Review of the MIP images confirms the above findings. IMPRESSION: 1. Massive pulmonary embolus within both main pulmonary arteries extending into all proximal lobar branches with CT evidence of right heart strain (RV/LV Ratio = 1.65) consistent with at least submassive (intermediate risk) PE. The presence of right heart strain has been associated with an increased risk of morbidity and mortality. Please activate Code PE by paging [phone] 2. Multifocal pulmonary ground-glass opacity without specific evidence of infection 3. Cholelithiasis. Critical Value/emergent results were called by telephone at the time of interpretation on 04/24/2019 at 9:38 pm to provider K. KBaltazar Najjar who verbally acknowledged these results. Electronically Signed   By: KUlyses JarredM.D.   On: 04/24/2019 21:38   CT ABDOMEN PELVIS W CONTRAST  Result Date: 05/12/2019 CLINICAL DATA:  Lower abdominal pain, pelvic discomfort and constipation for 1 week, fatigue, history type II diabetes mellitus, GERD EXAM: CT ABDOMEN AND PELVIS WITH CONTRAST TECHNIQUE: Multidetector CT imaging of the abdomen and pelvis was performed using the standard protocol following bolus administration of intravenous contrast. Sagittal and coronal MPR images reconstructed from axial data set. CONTRAST:  863mOMNIPAQUE IOHEXOL 300 MG/ML SOLN IV. No oral contrast. COMPARISON:  04/28/2019 FINDINGS: Lower chest: Mild bibasilar  atelectasis Hepatobiliary: Nodular cirrhotic appearing liver without focal mass. Elongated lateral segment LEFT lobe extending to LEFT lateral abdominal wall. Multiple calcified gallstones dependently in gallbladder. No gallbladder wall thickening or pericholecystic infiltration by CT. Pancreas: Normal appearance Spleen: Normal appearance Adrenals/Urinary Tract: Adrenal glands, kidneys, and proximal ureters normal appearance. Distal ureters and bladder obscured due to a combination of large pelvic mass/collection and beam hardening artifacts from RIGHT hip prosthesis. Stomach/Bowel: Stomach decompressed. Bowel loops unremarkable. Short segment of probable normal appendix visualized. Vascular/Lymphatic: IVC filter. Aorta  normal caliber. Vascular structures patent. No adenopathy. Reproductive: Uterus surgically absent by history. Nonvisualization of ovaries. Other: Large collection identified within the pelvis, 15.6 x 16.9 x 10.0 cm consistent with large hematoma identified on previous exam. This displaces bowel and compresses urinary bladder. Collection extends into the LEFT rectus abdominus muscle question origin of hemorrhage. Overall little change in appearance since prior exams. No free air. No hernia. Musculoskeletal: Osseous structures unremarkable. IMPRESSION: No significant interval change in size of large pelvic hematoma, 15.6 x 16.9 x 10.0 cm, displacing bowel and compressing bladder; this likely originates from the LEFT rectus abdominus muscle. Probable cirrhotic liver. Cholelithiasis. Electronically Signed   By: Lavonia Dana M.D.   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 As such, patient is no longer a candidate for anticoagulation the given  presence of lower extremity DVT, request made for placement of an IVC filter for temporary caval interruption purposes. EXAM: ULTRASOUND GUIDANCE FOR VASCULAR ACCESS IVC CATHETERIZATION AND VENOGRAM IVC FILTER INSERTION COMPARISON:  CT abdomen pelvis-04/28/2019 MEDICATIONS: None. ANESTHESIA/SEDATION: Fentanyl 50 mcg IV; Versed 1 mg IV Sedation Time: 10 minutes; The patient was continuously monitored during the procedure by the interventional radiology nurse under my direct supervision. CONTRAST:  40 cc Isovue-300 FLUOROSCOPY TIME:  2 minutes, 30 seconds (47.0 mGy) COMPLICATIONS: None immediate PROCEDURE: Informed consent was obtained from the patient following explanation of the procedure, risks, benefits and alternatives. The patient understands, agrees and consents for the procedure. All questions were addressed. A time out was performed prior to the initiation of the procedure. Maximal barrier sterile technique utilized including caps, mask, sterile gowns, sterile gloves, large sterile drape, hand hygiene, and Betadine prep. Under sterile condition and local anesthesia, right internal jugular venous access was performed with ultrasound. An ultrasound image was saved and sent to PACS. Over a guidewire, the IVC filter delivery sheath and inner dilator were advanced into the IVC just above the IVC bifurcation. Contrast injection was performed for an IVC venogram. Through the delivery sheath, a retrievable Denali IVC filter was deployed below the level of the renal veins and above the IVC bifurcation. Limited post deployment venacavagram was performed. The delivery sheath was removed and hemostasis was obtained with manual compression. A dressing was placed. The patient tolerated the procedure well without immediate post procedural complication. FINDINGS: The IVC is patent. No evidence of thrombus, stenosis, or occlusion. No variant venous anatomy. Successful placement of the IVC filter below the level of the renal  veins. Incidentally noted laminated gallstones overlying the right upper abdominal quadrant as demonstrated on preceding abdominal CT. IMPRESSION: Successful ultrasound and fluoroscopically guided placement of an infrarenal retrievable IVC filter via right jugular approach. PLAN: This IVC filter is potentially retrievable. The patient will be assessed for filter retrieval by Interventional Radiology in approximately 8-12 weeks. Further recommendations regarding filter retrieval, continued surveillance or declaration of device permanence, will be made at that time. Electronically Signed   By: Sandi Mariscal M.D.   On: 04/29/2019 17:51   US Venous Img Lower Bilateral (DVT)  Addendum Date: 05/12/2019   ADDENDUM REPORT: 05/12/2019 13:23 ADDENDUM: Findings were discussed with Dr. Anastasio Champion by phone. Electronically Signed   By: Aletta Edouard M.D.   On: 05/12/2019 13:23   Result Date: 05/12/2019 CLINICAL DATA:  History of recent  COVID-19 infection, bilateral pulmonary embolism and left lower extremity DVT. IVC filter was placed on 04/29/2019 due to development of a large spontaneous pelvic hematoma after anticoagulation. Development of increased lower extremity edema, left greater than right, and pain. EXAM: BILATERAL LOWER EXTREMITY VENOUS DOPPLER ULTRASOUND TECHNIQUE: Gray-scale sonography with graded compression, as well as color Doppler and duplex ultrasound were performed to evaluate the lower extremity deep venous systems from the level of the common femoral vein and including the common femoral, femoral, profunda femoral, popliteal and calf veins including the posterior tibial, peroneal and gastrocnemius veins when visible. The superficial great saphenous vein was also interrogated. Spectral Doppler was utilized to evaluate flow at rest and with distal augmentation maneuvers in the common femoral, femoral and popliteal veins. COMPARISON:  None. FINDINGS: RIGHT LOWER EXTREMITY Common Femoral Vein: Occlusive  thrombus present. Saphenofemoral Junction: Some thrombus appears to extend just into the upper segment of the GSV. Profunda Femoral Vein: Occlusive thrombus present. Femoral Vein: Occlusive thrombus present. Popliteal Vein: Occlusive thrombus present. Calf Veins: Occlusive tibial thrombus in the visualized calf veins. Superficial Great Saphenous Vein: Thrombus visualized in the upper GSV in the thigh. Venous Reflux:  None. Other Findings:  None. LEFT LOWER EXTREMITY Common Femoral Vein: Occlusive thrombus present. Saphenofemoral Junction: Thrombus extends into the GSV. Profunda Femoral Vein: Occlusive thrombus present. Femoral Vein: Occlusive thrombus present. Popliteal Vein: Occlusive thrombus present. Calf Veins: Occlusive thrombus present. Superficial Great Saphenous Vein: Thrombus visualized in the upper GSV in the thigh. Venous Reflux:  None. Other Findings:  None. IMPRESSION: Significant progression of bilateral lower extremity DVT with occlusive thrombus throughout both lower extremities. In the setting of an indwelling IVC filter, there would potentially be some concern of additional iliac and IVC thrombosis extending to the level of the filter. Consider CT venography of the abdomen and pelvis with contrast for further evaluation. Electronically Signed: By: Aletta Edouard M.D. On: 05/12/2019 13:09   DG Chest Port 1 View  Result Date: 05/11/2019 CLINICAL DATA:  Shortness of breath EXAM: PORTABLE CHEST 1 VIEW COMPARISON:  April 24, 2019 FINDINGS: The heart size and mediastinal contours are within normal limits. Mildly increased hazy airspace opacity seen at the left lung base. The visualized skeletal structures are unremarkable. IMPRESSION: Mildly increased hazy airspace opacity at the left lung base which could be due to atelectasis and/or early infectious etiology. Electronically Signed   By: Prudencio Pair M.D.   On: 05/11/2019 00:27   DG CHEST PORT 1 VIEW  Result Date: 04/24/2019 CLINICAL DATA:   68 year old with COVID-19 pneumonia, now with worsening hypoxia. EXAM: PORTABLE CHEST 1 VIEW COMPARISON:  04/21/2019. FINDINGS: Cardiac silhouette upper normal in size to mildly enlarged, unchanged. Improved aeration in the lung bases, with residual mild streaky opacities at the LEFT base. No new pulmonary parenchymal abnormalities. Normal pulmonary vascularity. No visible pleural effusions. IMPRESSION: 1. Improved aeration in the lung bases, with residual mild streaky atelectasis and/or bronchopneumonia at the LEFT base. 2. No new abnormalities. Electronically Signed   By: Evangeline Dakin M.D.   On: 04/24/2019 17:07   DG Chest Port 1 View  Result Date: 04/21/2019 CLINICAL DATA:  68 year old female with shortness of breath. EXAM: PORTABLE CHEST 1 VIEW COMPARISON:  Chest radiograph report dated 02/28/2001. The images are not available for direct comparison. FINDINGS: Minimal bibasilar, left greater than right, densities likely atelectatic changes. Atypical infiltrate is less likely but not excluded. Clinical correlation is recommended. There is no focal consolidation, pleural effusion, pneumothorax. The cardiac silhouette is  within normal limits. No acute osseous pathology. IMPRESSION: Minimal bibasilar atelectasis.  No focal consolidation. Electronically Signed   By: Anner Crete M.D.   On: 04/21/2019 17:45   VAS [place] LOWER EXTREMITY VENOUS (DVT)  Result Date: 04/28/2019  Lower Venous Study Indications: Pulmonary embolism. Other Indications: Covid positive. Risk Factors: Large pelvic hematoma is noted which appears to originate from the. Limitations: Body habitus. Comparison Study: No prior study on file for comparison. Performing Technologist: Sharion Dove RVS  Examination Guidelines: A complete evaluation includes B-mode imaging, spectral Doppler, color Doppler, and power Doppler as needed of all accessible portions of each vessel. Bilateral testing is considered an integral part of a complete  examination. Limited examinations for reoccurring indications may be performed as noted.  +---------+---------------+---------+-----------+----------+--------------+ RIGHT    CompressibilityPhasicitySpontaneityPropertiesThrombus Aging +---------+---------------+---------+-----------+----------+--------------+ CFV      Full           Yes      Yes                                 +---------+---------------+---------+-----------+----------+--------------+ SFJ      Full                                                        +---------+---------------+---------+-----------+----------+--------------+ FV Prox  Full                                                        +---------+---------------+---------+-----------+----------+--------------+ FV Mid   Full                                                        +---------+---------------+---------+-----------+----------+--------------+ FV DistalFull                                                        +---------+---------------+---------+-----------+----------+--------------+ PFV      Full                                                        +---------+---------------+---------+-----------+----------+--------------+ POP      Full           Yes      Yes                                 +---------+---------------+---------+-----------+----------+--------------+ PTV      Full                                                        +---------+---------------+---------+-----------+----------+--------------+  PERO     Full                                                        +---------+---------------+---------+-----------+----------+--------------+   +---------+---------------+---------+-----------+----------+-----------------+ LEFT     CompressibilityPhasicitySpontaneityPropertiesThrombus Aging    +---------+---------------+---------+-----------+----------+-----------------+ CFV      Full            Yes      Yes                                    +---------+---------------+---------+-----------+----------+-----------------+ SFJ      Full                                                           +---------+---------------+---------+-----------+----------+-----------------+ FV Prox  Full                                                           +---------+---------------+---------+-----------+----------+-----------------+ FV Mid   Full                                                           +---------+---------------+---------+-----------+----------+-----------------+ FV DistalFull                                                           +---------+---------------+---------+-----------+----------+-----------------+ PFV      Full                                                           +---------+---------------+---------+-----------+----------+-----------------+ POP      Full           No       No                                     +---------+---------------+---------+-----------+----------+-----------------+ PTV      Full                                         Age Indeterminate +---------+---------------+---------+-----------+----------+-----------------+ PERO     Full  Age Indeterminate +---------+---------------+---------+-----------+----------+-----------------+     Summary: Right: There is no evidence of deep vein thrombosis in the lower extremity. Left: Findings consistent with age indeterminate deep vein thrombosis involving the left posterior tibial veins, and left peroneal veins.  *See table(s) above for measurements and observations. Electronically signed by Servando Snare MD on 04/28/2019 at 10:55:03 AM.    Final    ECHOCARDIOGRAM LIMITED  Result Date: 04/25/2019   ECHOCARDIOGRAM LIMITED REPORT   Patient Name:   JO BOOZE Date of Exam: 04/25/2019 Medical Rec #:  [id number]          Height:       63.0 in Accession #:    [id number]        Weight:       246.5 lb Date of Birth:  May 09, 1951         BSA:          2.11 m Patient Age:    30 years          BP:           126/70 mmHg Patient Gender: F                 HR:           79 bpm. Exam Location:  Inpatient  Procedure: Limited Echo, Limited Color Doppler and Cardiac Doppler STAT ECHO Indications:    pulmonary embolus 415.19  History:        Patient has no prior history of Echocardiogram examinations.  Sonographer:    Johny Chess Referring Phys: [id number] [place]  1. Left ventricular ejection fraction, by visual estimation, is 50 to 55%. The left ventricle has normal function. There is no left ventricular hypertrophy.  2. Left ventricular diastolic parameters are consistent with Grade I diastolic dysfunction (impaired relaxation).  3. The left ventricle has no regional wall motion abnormalities.  4. Global right ventricle has normal systolic function.The right ventricular size is normal.  5. Left atrial size was normal.  6. Right atrial size was normal.  7. The mitral valve is normal in structure. No evidence of mitral valve regurgitation. No evidence of mitral stenosis.  8. The tricuspid valve is normal in structure. Tricuspid valve regurgitation is trivial.  9. The aortic valve is tricuspid. Aortic valve regurgitation is not visualized. Mild aortic valve sclerosis without stenosis. 10. The pulmonic valve was normal in structure. Pulmonic valve regurgitation is not visualized. 11. Mildly elevated pulmonary artery systolic pressure. 12. The inferior vena cava is normal in size with greater than 50% respiratory variability, suggesting right atrial pressure of 3 mmHg. 13. Low normal LV systolic function; grade 1 diastolic dysfunction; moderate RV dysfunction. FINDINGS  Left Ventricle: Left ventricular ejection fraction, by visual estimation, is 50 to 55%. The left ventricle has normal function. The left ventricle has no regional  wall motion abnormalities. There is no left ventricular hypertrophy. Left ventricular diastolic parameters are consistent with Grade I diastolic dysfunction (impaired relaxation). Normal left atrial pressure. Right Ventricle: The right ventricular size is normal.Global RV systolic function is has normal systolic function. The tricuspid regurgitant velocity is 2.82 m/s, and with an assumed right atrial pressure of 3 mmHg, the estimated right ventricular systolic pressure is mildly elevated at 34.8 mmHg. Left Atrium: Left atrial size was normal in size. Right Atrium: Right atrial size was normal in size Pericardium: There is no evidence of pericardial effusion. Mitral Valve: The mitral valve is normal in structure. No evidence of  mitral valve stenosis by observation. MV Area by PHT, 4.31 cm. MV PHT, 51.04 msec. No evidence of mitral valve regurgitation. Tricuspid Valve: The tricuspid valve is normal in structure. Tricuspid valve regurgitation is trivial. Aortic Valve: The aortic valve is tricuspid. Aortic valve regurgitation is not visualized. Mild aortic valve sclerosis is present, with no evidence of aortic valve stenosis. Pulmonic Valve: The pulmonic valve was normal in structure. Pulmonic valve regurgitation is not visualized. Aorta: The aortic root is normal in size and structure. Venous: The inferior vena cava is normal in size with greater than 50% respiratory variability, suggesting right atrial pressure of 3 mmHg.  LEFT VENTRICLE          Normals PLAX 2D LVIDd:         4.10 cm  3.6 cm   Diastology                 Normals LVIDs:         3.20 cm  1.7 cm   LV e' lateral:   8.27 cm/s 6.42 cm/s LV PW:         0.90 cm  1.4 cm   LV E/e' lateral: 8.4       15.4 LV IVS:        1.00 cm  1.3 cm   LV e' medial:    6.85 cm/s 6.96 cm/s LVOT diam:     1.90 cm  2.0 cm   LV E/e' medial:  10.2      6.96 LV SV:         33 ml    79 ml LV SV Index:   14.51    45 ml/m2 LVOT Area:     2.84 cm 3.14 cm2  RIGHT VENTRICLE RV S prime:      15.60 cm/s TAPSE (M-mode): 2.1 cm LEFT ATRIUM         Index LA diam:    3.20 cm 1.51 cm/m  AORTIC VALVE             Normals LVOT Vmax:   78.30 cm/s LVOT Vmean:  46.200 cm/s 75 cm/s LVOT VTI:    0.134 m     25.3 cm  AORTA                 Normals Ao Root diam: 3.10 cm 31 mm MITRAL VALVE              Normals   TRICUSPID VALVE             Normals MV Area (PHT): 4.31 cm             TR Peak grad:   31.8 mmHg MV PHT:        51.04 msec 55 ms     TR Vmax:        282.00 cm/s 288 cm/s MV Decel Time: 176 msec   187 ms MV E velocity: 69.80 cm/s 103 cm/s  SHUNTS MV A velocity: 78.80 cm/s 70.3 cm/s Systemic VTI:  0.13 m MV E/A ratio:  0.89       1.5       Systemic Diam: 1.90 cm  Kirk Ruths MD Electronically signed by Kirk Ruths MD Signature Date/Time: 04/25/2019/3:06:36 PM    Final    CT Angio Abd/Pel w/ and/or w/o  Result Date: 04/27/2019 CLINICAL DATA:  Acute generalized abdominal pain. EXAM: CTA ABDOMEN AND PELVIS WITHOUT AND WITH CONTRAST TECHNIQUE: Multidetector CT imaging of the abdomen and pelvis was performed  using the standard protocol during bolus administration of intravenous contrast. Multiplanar reconstructed images and MIPs were obtained and reviewed to evaluate the vascular anatomy. CONTRAST:  143m OMNIPAQUE IOHEXOL 350 MG/ML SOLN COMPARISON:  None. FINDINGS: VASCULAR Aorta: Normal caliber aorta without aneurysm, dissection, vasculitis or significant stenosis. Celiac: Patent without evidence of aneurysm, dissection, vasculitis or significant stenosis. SMA: Patent without evidence of aneurysm, dissection, vasculitis or significant stenosis. Renals: Both renal arteries are patent without evidence of aneurysm, dissection, vasculitis, fibromuscular dysplasia or significant stenosis. IMA: Patent without evidence of aneurysm, dissection, vasculitis or significant stenosis. Inflow: Patent without evidence of aneurysm, dissection, vasculitis or significant stenosis. Proximal Outflow: Bilateral common  femoral and visualized portions of the superficial and profunda femoral arteries are patent without evidence of aneurysm, dissection, vasculitis or significant stenosis. Veins: No obvious venous abnormality within the limitations of this arterial phase study. Review of the MIP images confirms the above findings. NON-VASCULAR Lower chest: No acute abnormality. Hepatobiliary: Cholelithiasis is noted. No biliary dilatation is noted. Minimally nodular hepatic margins are noted suggesting possible early hepatic cirrhosis. Pancreas: Unremarkable. No pancreatic ductal dilatation or surrounding inflammatory changes. Spleen: Normal in size without focal abnormality. Adrenals/Urinary Tract: Adrenal glands and kidneys appear normal. No hydronephrosis is noted. Urinary bladder appears to be compressed by large pelvic hematoma. Stomach/Bowel: The stomach appears normal. There is no evidence of bowel obstruction or inflammation. The appendix is not visualized. Lymphatic: No adenopathy is noted. Reproductive: Status post hysterectomy. No adnexal masses. Other: There is a large complex lobulated density in the left in anterior portion of the pelvis which appears to arise from inferior portion of left rectal sheath, most consistent with hemorrhage. There is noted some hyperdensity along the medial portion of the left inferior rectus sheath which may represent contrast extravasation and possible active hemorrhage. Musculoskeletal: No acute or significant osseous findings. IMPRESSION: VASCULAR Large pelvic hematoma is noted which appears to originate from the inferior portion of the left rectus muscle, most consistent with spontaneous hemorrhage, potentially related to anticoagulation. This causes displacement and compression of the urinary bladder. There is seen some irregular high density along the medial portion of the left inferior rectus muscle which may represent contrast extravasation and possibly active hemorrhage. Critical  Value/emergent results were called by telephone at the time of interpretation on 04/27/2019 at 5:05 pm to pEminence, who verbally acknowledged these results. Mild cholelithiasis is noted. Minimally nodular hepatic margins are noted suggesting possible hepatic cirrhosis. Electronically Signed   By: JMarijo ConceptionM.D.   On: 04/27/2019 17:14    Labs:  CBC: Recent Labs    05/10/19 2358 05/11/19 0316 05/12/19 0609 05/14/19 0756  WBC 16.1* 16.6* 17.7* 12.8*  HGB 11.7* 11.6* 10.8* 10.2*  HCT 38.1 38.4 36.1 31.7*  PLT 166 PLATELET CLUMPS NOTED ON SMEAR, COUNT APPEARS ADEQUATE 152 148*    COAGS: Recent Labs    04/27/19 2018 05/14/19 1200  INR 1.4* 1.2  APTT  --  32    BMP: Recent Labs    05/10/19 2358 05/11/19 0316 05/12/19 0609 05/14/19 0756  NA 132* 131* 129* 128*  K 4.9 5.3* 4.4 4.8  CL 99 101 98 100  CO2 19* 16* 18* 17*  GLUCOSE 235* 220* 233* 227*  BUN 19 21 27* 29*  CALCIUM 9.5 9.1 9.5 9.1  CREATININE 1.21* 1.29* 1.19* 0.91  GFRNONAA 46* 43* 47* >60  GFRAA 54* 50* 55* >60    LIVER FUNCTION TESTS: Recent Labs    04/30/19 0320  05/10/19 2358 05/11/19 0316 05/12/19 0609  BILITOT 2.5* 2.3* 2.1* 2.2*  AST _0 ALT _1 ALKPHOS 42 86 77 76  PROT 6.4* 8.5* 7.9 8.0  ALBUMIN 4.8 4.2 3.9 3.7    TUMOR MARKERS: No results for input(s): AFPTM, CEA, CA199, CHROMGRNA in the last 8760 hours.  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 BLE [place] was performed due to complaints of lower extremity pain which showed significant progression of BLE DVT with occlusive thrombus throughout both lower extremities, concerning for additional iliac and IVC thrombosis  extending to the level of the filter. She was transferred to [facility] for further care and vascular surgery was consulted. She was noted to have adequate lower extremity perfusion and was not deemed a candidate for surgical intervention at this time. IR has been consulted for possible thrombectomy.  Patient was seen in conjunction with Dr. Ronny Bacon today - thorough discussion regarding the options available at this present time which include:  - Continuing with current medical management without further anticoagulation trials - Trying heparin gtt and if she were to have further bleeding complications, anticoagulation would be stopped and no further procedure would likely be indicated given the likelihood that she would continue to form clots - Trying heparin gtt and if she were to tolerate the anticoagulation with improvement in her BLE symptoms no further procedure would be indicated currently and she would continue with anticoagulation - Trying heparin gtt and if she were to tolerate the anticoagulation but without improvement in her BLE symptoms we would consider proceeding with mechanical thrombectomy later this week (likely Thursday)  Ms. Mailhot and her husband state understanding to all of the above options, all questions were answered to their satisfaction and they have agreed to begin heparin gtt trial today, primary team has been informed.   IR will follow up with patient tomorrow. Please call with any questions or concerns.   Thank you for this interesting consult.  I greatly enjoyed meeting PIERA DOWNS and look forward to participating in their care.  A copy of this report was sent to the requesting provider on this date.  Electronically Signed: Joaquim Nam, PA-C 05/14/2019, 2:43 PM   I spent a total of 40 Minutes  in face to face in clinical consultation, greater than 50% of which was counseling/coordinating care for possible thrombectomy/IVC filter evaluation.

## 2019-05-14 NOTE — Progress Notes (Addendum)
PROGRESS NOTE  Regina Munoz ZOX:[id number] DOB: 05/31/1950 DOA: 05/10/2019 PCP: Hart RobinsonsButler, CynthiJanean Sarka P, DO  Brief History   68 year old woman discharged 12/17 following treatment for Covid pneumonia diagnosed 11/30, hospital course complicated by worsening hypoxemia leading to CTA which revealed massive bilateral pulmonary embolism with RV strain, started on heparin infusion, then developed abdominal pain and CT revealed spontaneous left pelvic hematoma.  IR consulted for possible embolization but patient stabilized after transfusion.  Left lower extremity venous ultrasound showed DVT.  IR was consulted and IVC filter placed 12/14.  She was subsequently discharged home on oxygen, no anticoagulation.  She was seen both by interventional radiology and pulmonology during that hospitalization.  Presented late 12/25 with increasing fatigue.  Was admitted for severe sepsis from pneumonia, subsequently complained of increasing lower extremity edema and pain, ultrasound revealed extensive bilateral lower extremity DVTs with progression with concern for involvement into the pelvis and even to IVC filter.  She was transferred to [facility] for [facility].  She was seen by vascular surgery, but in the absence of anticoagulation, no intervention recommended.  Interventional radiology and hematology will be consulting.  Decision point seems to be to either proceed with no treatment or initiate anticoagulation and monitor closely for recurrent bleeding.  A & P  Severe sepsis secondary to lobar pneumonia (poa), with tachycardia, tachypnea and lactic acidosis on admission, acute hypoxic respiratory failure, chest x-ray with opacity left lung base.  Empirically started on vancomycin and cefepime. --Appears to be weaning down on oxygen.  MRSA PCR was - December 8.  Will stop vancomycin.  Can likely change to oral antibiotics 12/30.  Extensive, progressive bilateral lower extremity DVT with concern for  progression to the iliac and IVC given known IVC filter. --Excellent dorsalis pedis pulses.  No change on exam today.  --No intervention recommended by vascular surgery in absence of anticoagulation. --Discussed with Dr. Grace IsaacWatts interventional radiology, will see today and provide further recommendations.  However, in the absence of anticoagulation, not likely to recommend intervention. --Condition is complicated by extensive hematoma that occurred spontaneously while on heparin infusion less than 3 weeks ago.  Submassive bilateral PE with right-sided heart strain by CT, diagnosed previous admission (discharged 05/02/2019).   Extensive large pelvic hematoma on previous hospitalization (discharged 12/17) displacing bowel and compressing bladder, likely originating from left rectus abdominis muscle. --No significant interval change on CT 12/27 compared to previous study --Hemoglobin remained stable.  Creatinine function has improved.  Thrombocytopenia, present since April 21, 2019.  Suspect related to progressive DVTs. --Follow CBC daily.  Hyponatremia.  Suspect secondary to dehydration. --No significant change.  Will continue IV fluids and check in a.m.  Creatinine has improved.  Constipation.  Of note pelvic hematoma causes displacement of the bowel and compression of the bladder.  If develops difficulty voiding or constipations unrelieved, may need to consider evacuation. --Continue aggressive bowel regimen.  Enema today.  Diabetes mellitus type 2 --CBG remains stable.  Status post COVID-19 viral pneumonia treated with remdesivir and Decadron with discharge 12/17.  Initially Covid + 04/15/2019.  Clinically the patient appears stable without gross change in bilateral lower extremities.  I discussed in detail with Dr. Grace IsaacWatts, at this point recommendation is to trial heparin.  She is not a candidate for intervention of any kind without anticoagulation.  If she tolerates anticoagulation,  mechanical thrombectomy could be considered in the future if her condition fails to improve.  Interventional radiology will see today.  I have discussed with patient the  treatment options including doing nothing, or initiating anticoagulation.  I discussed that no intervention can be offered without anticoagulation.  We talked about previous heparin infusion last hospitalization which resulted in spontaneous bleeding.  She has no known history of bleeding problems.  We talked about the risk/benefit of anticoagulation to include the benefits of stabilization of clot and prevention of propagation, and risks including spontaneous bleeding, life-threatening bleeding and death.  We discussed that spontaneous bleeding could occur anywhere, even in the brain and this could be catastrophic.  Of asked her to discuss with her husband and I will follow up to discuss answer questions again with them this afternoon.  I have requested hematology consultation which will be completed this afternoon.  Addendum.  Discussed with hematology NP.  I followed up with patient and husband at bedside.  Patient reports no bowel movement in the last 1-2 weeks.  Passing gas however no difficulty urinating.  I reviewed CT scan findings with general surgery.  In regard to the hematoma, greatest concern would be for free rupture into the peritoneal space which can result in exsanguination, or short of this rapid decompensation necessitating emergency surgery at great risk.  Mortality would be expected to be high in the situation.  Other possible complications would include bowel obstruction secondary to external compression or bowel compromise causing ischemia necessitating surgery.  General surgery has recommended rechallenge with heparin if patient accepts risks.  I discussed this in detail with the patient and husband at bedside.  They will discuss with hematologist and have a decision by tomorrow as to whether they wish to proceed with  heparin.  Time 40 minutes, Grant 50% counseling coordination of care.  DVT prophylaxis: TED hose Code Status: Full Family Communication: none Disposition Plan: home    Murray Hodgkins, MD  [facility] Direct contact: see [email or web address] (further directions at bottom of note if needed) 7PM-7AM contact night coverage as at bottom of note 05/14/2019, 10:54 AM  LOS: 3 days   Significant Hospital Events   . 12/26 admitted for severe sepsis secondary to pneumonia   Consults:  .    Procedures:  .   Significant Diagnostic Tests:  . 12/26 chest x-ray left lower lobe opacity . 1227 bilateral lower extremity Doppler: Significant progression bilateral lower extremity DVT with occlusive thrombus throughout, potentially some concern for additional iliac and IVC thrombosis extending to the level of the filter . 12/27 CT abdomen pelvis.  No significant interval change large pelvic hematoma 15 x 16 x 10, displacing bowel and compressing bladder, likely originates left rectus abdominal muscle   Micro Data:  .    Antimicrobials:  .   Interval History/Subjective  Feels a little better today.  Breathing okay.  Still has significant leg swelling although no significant change in pain.  Objective   Vitals:  Vitals:   05/14/19 0436 05/14/19 0600  BP: 116/63 (!) 111/47  Pulse: (!) 106   Resp: 20 (!) 24  Temp: 98.6 F (37 C)   SpO2: 94%     Exam:  Constitutional.  Appears calm, comfortable.  Nontoxic. Psychiatric.  Grossly normal mood and affect.  Speech fluent and appropriate. Respiratory.  Clear to auscultation bilaterally.  No wheezes, rales or rhonchi.  Normal respiratory effort.  Good air movement. Cardiovascular.  Regular rate and rhythm.  No murmur, rub or gallop.  No change in 3+ bilateral lower extremity edema.  Telemetry sinus rhythm.  Dorsalis pedis pulses 2+ bilaterally.  Perfusion both legs and feet appear  grossly normal. Abdomen.  Soft, nondistended.  Obese.  Lower  abdominal tenderness noted. Musculoskeletal.  No significant change in appearance of bilateral lower extremities Skin.  Bilateral lower extremity feet appear unremarkable.  I have personally reviewed the following:   Today's Data  . Sodium 128, potassium 4.8, CO2 17, BUN slightly higher at 29 but creatinine is better at 0.91. . WBC has declined, 12.8.  Hemoglobin stable at 10.2.  Platelets slightly lower at 148.  Scheduled Meds: . dextromethorphan-guaiFENesin  1 tablet Oral BID  . feeding supplement (GLUCERNA SHAKE)  237 mL Oral TID BM  . insulin aspart  0-15 Units Subcutaneous TID WC  . insulin aspart  0-5 Units Subcutaneous QHS  . pantoprazole  40 mg Oral Daily  . polyethylene glycol  17 g Oral BID  . senna-docusate  2 tablet Oral BID  . umeclidinium bromide  1 puff Inhalation Daily   Continuous Infusions: . sodium chloride 50 mL/hr at 05/13/19 0156  . ceFEPime (MAXIPIME) IV 2 g (05/14/19 0322)    Principal Problem:   DVT (deep venous thrombosis) (HCC) Active Problems:   DM type 2 without retinopathy (HCC)   Bilateral pulmonary embolism (HCC)   SIRS (systemic inflammatory response syndrome) (HCC)   Anxiety   Depression   Asthma   Lactic acidosis   Severe sepsis (HCC)   Lobar pneumonia (HCC)   LOS: 3 days   How to contact the [facility] Attending or Consulting provider 7A - 7P or covering provider during after hours 7P -7A, for this patient?  1. Check the care team in [facility] and look for a) attending/consulting [facility] provider listed and b) the [facility] team listed 2. Log into [email or web address] and use [facility]'s universal password to access. If you do not have the password, please contact the hospital operator. 3. Locate the [facility] provider you are looking for under [facility] and page to a number that you can be directly reached. 4. If you still have difficulty reaching the provider, please page the [facility] (Director on Call) for the Hospitalists listed on amion for  assistance.

## 2019-05-14 NOTE — Progress Notes (Signed)
PT Cancellation Note  Patient Details Name: Regina Munoz MRN: [id number] DOB: Jul 12, 1950   Cancelled Treatment:    Reason Eval/Treat Not Completed: Medical issues which prohibited therapy. Per chart review, pt with new B LE DVT and PE with large pelvic hematoma. Pt not currently on anticoagulants. PT will follow up as able and pt medically appropriate.   Zachary George PT, DPT 8:31 AM,05/14/19    [name] 05/14/2019, 8:31 AM

## 2019-05-14 NOTE — Progress Notes (Addendum)
I have reviewed Dr. Geralyn Flash and Dr. Pascal Lux' notes as well as having communicated with both of them directly earlier.  Both have recommended initiation of heparin drip, as has general surgery.  I concur. The benefit of anticoagulation is felt to outweigh the risk.  I discussed again with the patient at bedside this evening and she wishes to initiate heparin infusion and with her husband by telephone, who concurs.  They both demonstrated good understanding of the risk/benefit.  I discussed with patient to notify nurse for any change in condition.  I have also discussed with night nurse to monitor for changes in condition that may suggest internal hemorrhage.  We will start heparin at a lower end of therapeutic dose, no bolus. I've discussed with pharmacist Pilar Plate.  CBC every 6 hours first 24 hours.  For significant drop in hemoglobin, check stat CT without contrast.   Murray Hodgkins, MD [facility] [phone]

## 2019-05-14 NOTE — Progress Notes (Signed)
ANTICOAGULATION CONSULT NOTE - Initial Consult  Pharmacy Consult for heparin Indication:History of BL PEs/ DVT - new  No Known Allergies  Patient Measurements: Height: 5\' 5"  (165.1 cm) Weight: 235 lb (106.6 kg) IBW/kg (Calculated) : 57 Heparin Dosing Weight: 78kg  Vital Signs: Temp: 98.4 F (36.9 C) (12/29 1125) Temp Source: Oral (12/29 1125) BP: 116/80 (12/29 1714) Pulse Rate: 101 (12/29 1125)  Labs: Recent Labs    05/12/19 0609 05/14/19 0756 05/14/19 1200  HGB 10.8* 10.2*  --   HCT 36.1 31.7*  --   PLT 152 148*  --   APTT  --   --  32  LABPROT  --   --  14.8  INR  --   --  1.2  CREATININE 1.19* 0.91  --     Estimated Creatinine Clearance: 72.7 mL/min (by C-G formula based on SCr of 0.91 mg/dL).   Medical History: Past Medical History:  Diagnosis Date  . Anxiety   . Arthritis   . Asthma   . Complication of anesthesia   . COVID-19   . Diabetes mellitus without complication ([place])   . Dysrhythmia    palpitations ->20 yrs stress test neg nothing since  . GERD (gastroesophageal reflux disease)   . Headache   . PONV (postoperative nausea and vomiting)    Assessment: 68 year old female with recent bilateral PEs, now with extensive bilateral DVT. Patient was placed on heparin last admit and had spontaneous retroperitoneal /abdominal wall bleed. Discussed with Dr.Goodrich heparin dosing, will not bolus and aim for low end of goal for at least the first 24 hours. Can consider escalating dose if she tolerates tomorrow.    Patient previously received 1250 units/hr of heparin and that resulted in a level of 0.4-0.5. Will start at lower rate tonight and aim for low end of goal despite clot burden.   Goal of Therapy:  Heparin level ~0.3-0.4 units/ml Monitor platelets by anticoagulation protocol: Yes   Plan:  Start heparin infusion at 1000 units/hr Check anti-Xa level in 6 hours and daily while on heparin Continue to monitor H&H and platelets  Erin Hearing PharmD.,  BCPS Clinical Pharmacist 05/14/2019 7:18 PM

## 2019-05-15 LAB — CBC
HCT: 29.7 % — ABNORMAL LOW (ref 36.0–46.0)
HCT: 31.3 % — ABNORMAL LOW (ref 36.0–46.0)
HCT: 31.6 % — ABNORMAL LOW (ref 36.0–46.0)
HCT: 32.7 % — ABNORMAL LOW (ref 36.0–46.0)
Hemoglobin: 10 g/dL — ABNORMAL LOW (ref 12.0–15.0)
Hemoglobin: 9.6 g/dL — ABNORMAL LOW (ref 12.0–15.0)
Hemoglobin: 9.8 g/dL — ABNORMAL LOW (ref 12.0–15.0)
Hemoglobin: 9.9 g/dL — ABNORMAL LOW (ref 12.0–15.0)
MCH: 26.9 pg (ref 26.0–34.0)
MCH: 27 pg (ref 26.0–34.0)
MCH: 27 pg (ref 26.0–34.0)
MCH: 27.6 pg (ref 26.0–34.0)
MCHC: 30.6 g/dL (ref 30.0–36.0)
MCHC: 31.3 g/dL (ref 30.0–36.0)
MCHC: 31.3 g/dL (ref 30.0–36.0)
MCHC: 32.3 g/dL (ref 30.0–36.0)
MCV: 85.3 fL (ref 80.0–100.0)
MCV: 86 fL (ref 80.0–100.0)
MCV: 86.1 fL (ref 80.0–100.0)
MCV: 88.4 fL (ref 80.0–100.0)
Platelets: 161 10*3/uL (ref 150–400)
Platelets: 168 10*3/uL (ref 150–400)
Platelets: 173 10*3/uL (ref 150–400)
Platelets: 183 10*3/uL (ref 150–400)
RBC: 3.48 MIL/uL — ABNORMAL LOW (ref 3.87–5.11)
RBC: 3.64 MIL/uL — ABNORMAL LOW (ref 3.87–5.11)
RBC: 3.67 MIL/uL — ABNORMAL LOW (ref 3.87–5.11)
RBC: 3.7 MIL/uL — ABNORMAL LOW (ref 3.87–5.11)
RDW: 20.3 % — ABNORMAL HIGH (ref 11.5–15.5)
RDW: 20.4 % — ABNORMAL HIGH (ref 11.5–15.5)
RDW: 20.5 % — ABNORMAL HIGH (ref 11.5–15.5)
RDW: 20.7 % — ABNORMAL HIGH (ref 11.5–15.5)
WBC: 11.7 10*3/uL — ABNORMAL HIGH (ref 4.0–10.5)
WBC: 11.9 10*3/uL — ABNORMAL HIGH (ref 4.0–10.5)
WBC: 12.2 10*3/uL — ABNORMAL HIGH (ref 4.0–10.5)
WBC: 12.2 10*3/uL — ABNORMAL HIGH (ref 4.0–10.5)
nRBC: 0 % (ref 0.0–0.2)
nRBC: 0 % (ref 0.0–0.2)
nRBC: 0 % (ref 0.0–0.2)
nRBC: 0 % (ref 0.0–0.2)

## 2019-05-15 LAB — HEPARIN LEVEL (UNFRACTIONATED)
Heparin Unfractionated: 0.21 IU/mL — ABNORMAL LOW (ref 0.30–0.70)
Heparin Unfractionated: <0.1 IU/mL — ABNORMAL LOW (ref 0.30–0.70)
Heparin Unfractionated: <0.1 IU/mL — ABNORMAL LOW (ref 0.30–0.70)

## 2019-05-15 LAB — BASIC METABOLIC PANEL
Anion gap: 7 (ref 5–15)
BUN: 25 mg/dL — ABNORMAL HIGH (ref 8–23)
CO2: 19 mmol/L — ABNORMAL LOW (ref 22–32)
Calcium: 8.8 mg/dL — ABNORMAL LOW (ref 8.9–10.3)
Chloride: 102 mmol/L (ref 98–111)
Creatinine, Ser: 0.9 mg/dL (ref 0.44–1.00)
GFR calc Af Amer: >60 mL/min (ref >60)
GFR calc non Af Amer: >60 mL/min (ref >60)
Glucose, Bld: 240 mg/dL — ABNORMAL HIGH (ref 70–99)
Potassium: 4.8 mmol/L (ref 3.5–5.1)
Sodium: 128 mmol/L — ABNORMAL LOW (ref 135–145)

## 2019-05-15 LAB — GLUCOSE, CAPILLARY
Glucose-Capillary: 250 mg/dL — ABNORMAL HIGH (ref 70–99)
Glucose-Capillary: 214 mg/dL — ABNORMAL HIGH (ref 70–99)
Glucose-Capillary: 189 mg/dL — ABNORMAL HIGH (ref 70–99)
Glucose-Capillary: 245 mg/dL — ABNORMAL HIGH (ref 70–99)

## 2019-05-15 MED ORDER — LORATADINE 10 MG PO TABS
10.0000 mg | ORAL_TABLET | Freq: Every day | ORAL | Status: DC
  Administered 2019-05-15 – 2019-05-21 (×6): 10 mg via ORAL
  Filled 2019-05-15 (×7): qty 1

## 2019-05-15 MED ORDER — SODIUM CHLORIDE 0.9 % IV SOLN: INTRAVENOUS | Status: DC

## 2019-05-15 MED ORDER — INSULIN GLARGINE 100 UNIT/ML ~~LOC~~ SOLN
10.0000 [IU] | Freq: Every day | SUBCUTANEOUS | Status: DC
  Administered 2019-05-16 – 2019-05-21 (×6): 10 [IU] via SUBCUTANEOUS
  Filled 2019-05-15 (×7): qty 0.1

## 2019-05-15 MED ORDER — ZINC SULFATE 220 (50 ZN) MG PO CAPS
220.0000 mg | ORAL_CAPSULE | Freq: Every day | ORAL | Status: DC
  Administered 2019-05-15 – 2019-05-21 (×6): 220 mg via ORAL
  Filled 2019-05-15 (×7): qty 1

## 2019-05-15 MED ORDER — ACETAMINOPHEN 500 MG PO TABS
1000.0000 mg | ORAL_TABLET | Freq: Four times a day (QID) | ORAL | Status: DC | PRN
  Administered 2019-05-16: 1000 mg via ORAL
  Filled 2019-05-15: qty 2

## 2019-05-15 MED ORDER — VITAMIN D 25 MCG (1000 UNIT) PO TABS
1000.0000 [IU] | ORAL_TABLET | Freq: Every day | ORAL | Status: DC
  Administered 2019-05-15 – 2019-05-21 (×6): 1000 [IU] via ORAL
  Filled 2019-05-15 (×7): qty 1

## 2019-05-15 MED ORDER — ASCORBIC ACID 500 MG PO TABS
500.0000 mg | ORAL_TABLET | Freq: Every day | ORAL | Status: DC
  Administered 2019-05-15 – 2019-05-21 (×6): 500 mg via ORAL
  Filled 2019-05-15 (×7): qty 1

## 2019-05-15 NOTE — H&P (Signed)
Chief Complaint: Patient was seen in consultation today for bilateral lower extremity thrombectomy.  Referring Physician(s): Dr. Sarajane Jews  Supervising Physician: Corrie Mckusick  Patient Status: Regina Munoz - In-pt  History of Present Illness: Regina Munoz is a 68 y.o. female with a past medical history significant for anxiety, arthritis, GERD, DM, spontaneous retroperitoneal hematoma development while on anticoagulation and recent COVID-19 infection with DVT/PE development s/p IVC filter placement in IR on 04/29/19 seen today for follow up on bilateral lower extremity DVT and to discuss possible thrombectomy. Please see consult note from yesterday (12/29) for full history and previous discussions.  Regina Munoz is seen in her room this afternoon by myself and Dr. Earleen Newport, her husband is also present during our discussion. She reports that her legs are feeling better, possibly a little less swollen than yesterday and she is able to move her legs more than she did yesterday. She denies any other complaints and is feeling fairly well overall. She has not had any abdominal pain or abnormal bleeding that she is aware of. Options for treatment were thoroughly reviewed by Dr. Earleen Newport including mechanical thrombectomy.   Past Medical History:  Diagnosis Date  . Anxiety   . Arthritis   . Asthma   . Complication of anesthesia   . COVID-19   . Diabetes mellitus without complication ([place])   . Dysrhythmia    palpitations ->20 yrs stress test neg nothing since  . GERD (gastroesophageal reflux disease)   . Headache   . PONV (postoperative nausea and vomiting)     Past Surgical History:  Procedure Laterality Date  . ABDOMINAL HYSTERECTOMY    . ANTERIOR CERVICAL DECOMP/DISCECTOMY FUSION N/A 02/17/2016   Procedure: ANTERIOR CERVICAL DECOMPRESSION/DISCECTOMY FUSION 1 LEVEL C4-5;  Surgeon: Melina Schools, MD;  Location: [facility];  Service: Orthopedics;  Laterality: N/A;  . EYE SURGERY Bilateral    cataracts    . HIP CLOSED REDUCTION Right 12/24/2012   Procedure: CLOSED REDUCTION HIP;  Surgeon: Sinclair Ship, MD;  Location: [facility];  Service: Orthopedics;  Laterality: Right;  . IR IVC FILTER PLMT / S&I Burke Keels GUID/MOD SED  04/29/2019  . JOINT REPLACEMENT  2002   right and left knees  . SHOULDER ARTHROSCOPY Left   . TOTAL HIP ARTHROPLASTY Right    2002    Allergies: Patient has no known allergies.  Medications: Prior to Admission medications   Medication Sig Start Date End Date Taking? Authorizing Provider  acetaminophen (TYLENOL) 500 MG tablet Take 1,000 mg by mouth every 6 (six) hours as needed for mild pain or moderate pain.    Yes [provider]  albuterol (VENTOLIN HFA) 108 (90 Base) MCG/ACT inhaler Inhale 2 puffs into the lungs every 4 (four) hours as needed for wheezing or shortness of breath. 05/02/19  Yes Regalado, Belkys A, MD  ascorbic acid (VITAMIN C) 500 MG tablet Take 1 tablet (500 mg total) by mouth daily. 05/02/19  Yes Regalado, Belkys A, MD  blood glucose meter kit and supplies KIT Dispense based on patient and insurance preference. Use up to four times daily as directed. (FOR ICD-9 250.00, 250.01). 05/02/19  Yes Regalado, Belkys A, MD  buPROPion (ZYBAN) 150 MG 12 hr tablet Take 75 mg by mouth every morning.  03/28/19  Yes [provider]  cetirizine (ZYRTEC) 10 MG tablet Take 10 mg by mouth daily. 02/13/19  Yes [provider]  cholecalciferol (VITAMIN D) 1000 UNITS tablet Take 1,000 Units by mouth daily.   Yes [provider]  dextromethorphan-guaiFENesin (MUCINEX DM) 30-600 MG 12hr tablet Take 1 tablet by mouth 2 (two) times daily. 05/02/19  Yes Regalado, Belkys A, MD  diclofenac Sodium (VOLTAREN) 1 % GEL Apply 2 g topically daily as needed (for pain).    Yes [provider]  Insulin Glargine (LANTUS) 100 UNIT/ML Solostar Pen Inject 10 Units into the skin daily. 05/02/19  Yes Regalado, Belkys A, MD  Insulin Pen Needle (PEN  NEEDLES 3/16") 31G X 5 MM MISC 1 application by Does not apply route daily. 05/02/19  Yes Regalado, Belkys A, MD  metFORMIN (GLUCOPHAGE) 500 MG tablet Take 500 mg by mouth 2 (two) times daily with a meal.    Yes [provider]  omeprazole (PRILOSEC) 20 MG capsule Take 20 mg by mouth daily.   Yes [provider]  polyethylene glycol (MIRALAX / GLYCOLAX) 17 g packet Take 17 g by mouth daily as needed for mild constipation. 05/02/19  Yes Regalado, Belkys A, MD  tiotropium (SPIRIVA HANDIHALER) 18 MCG inhalation capsule Place 1 capsule (18 mcg total) into inhaler and inhale 2 (two) times daily. 05/02/19  Yes Regalado, Belkys A, MD  zinc sulfate 220 (50 Zn) MG capsule Take 1 capsule (220 mg total) by mouth daily. 05/02/19  Yes Regalado, Cassie Freer, MD     History reviewed. No pertinent family history.  Social History   Socioeconomic History  . Marital status: Married    Spouse name: Not on file  . Number of children: Not on file  . Years of education: Not on file  . Highest education level: Not on file  Occupational History  . Not on file  Tobacco Use  . Smoking status: Never Smoker  . Smokeless tobacco: Never Used  Substance and Sexual Activity  . Alcohol use: No  . Drug use: No  . Sexual activity: Not on file  Other Topics Concern  . Not on file  Social History Narrative  . Not on file   Social Determinants of Health   Financial Resource Strain: Low Risk   . Difficulty of Paying Living Expenses: Not hard at all  Food Insecurity: No Food Insecurity  . Worried About Charity fundraiser in the Last Year: Never true  . Ran Out of Food in the Last Year: Never true  Transportation Needs: No Transportation Needs  . Lack of Transportation (Medical): No  . Lack of Transportation (Non-Medical): No  Physical Activity: Unknown  . Days of Exercise per Week: 1 day  . Minutes of Exercise per Session: Not on file  Stress:   . Feeling of Stress : Not on file  Social  Connections:   . Frequency of Communication with Friends and Family: Not on file  . Frequency of Social Gatherings with Friends and Family: Not on file  . Attends Religious Services: Not on file  . Active Member of Clubs or Organizations: Not on file  . Attends Archivist Meetings: Not on file  . Marital Status: Not on file     Review of Systems: A 12 point ROS discussed and pertinent positives are indicated in the HPI above.  All other systems are negative.  Review of Systems  Constitutional: Negative for chills and fever.  HENT: Negative for nosebleeds.   Respiratory: Negative for cough and shortness of breath.   Cardiovascular: Positive for leg swelling ("little better than yesterday"). Negative for chest pain.  Gastrointestinal: Negative for abdominal pain, blood in stool, diarrhea, nausea and vomiting.  Genitourinary: Negative  for hematuria.  Skin: Negative for color change.  Neurological: Negative for dizziness and headaches.    Vital Signs: BP 115/74   Pulse 100   Temp 98.1 F (36.7 C) (Oral)   Resp 20   Ht _0  (1.651 m)   Wt 235 lb (106.6 kg)   SpO2 97%   BMI 39.11 kg/m   Physical Exam Vitals and nursing note reviewed.  Constitutional:      General: She is not in acute distress.    Appearance: She is obese.  HENT:     Head: Normocephalic.     Mouth/Throat:     Mouth: Mucous membranes are dry.     Pharynx: Oropharynx is clear. No oropharyngeal exudate or posterior oropharyngeal erythema.  Cardiovascular:     Rate and Rhythm: Regular rhythm. Tachycardia present.  Pulmonary:     Effort: Pulmonary effort is normal.     Breath sounds: Normal breath sounds.  Abdominal:     General: There is no distension.     Palpations: Abdomen is soft.     Tenderness: There is no abdominal tenderness.  Musculoskeletal:        General: Tenderness (mild bilateral calf tenderness to palpation) present.     Right lower leg: Edema (2+ pitting edema - pedal to knee)  present.     Left lower leg: Edema (2+ pitting edema - pedal to knee) present.  Skin:    General: Skin is warm and dry.  Neurological:     Mental Status: She is alert and oriented to person, place, and time.  Psychiatric:        Mood and Affect: Mood normal.        Behavior: Behavior normal.        Thought Content: Thought content normal.        Judgment: Judgment normal.      MD Evaluation Airway: WNL Heart: WNL Abdomen: WNL Chest/ Lungs: WNL ASA  Classification: 3 Mallampati/Airway Score: Two   Imaging: CT ABDOMEN PELVIS WO CONTRAST  Result Date: 04/28/2019 CLINICAL DATA:  Follow-up of retroperitoneal hematoma. Pulmonary emboli. EXAM: CT ABDOMEN AND PELVIS WITHOUT CONTRAST TECHNIQUE: Multidetector CT imaging of the abdomen and pelvis was performed following the standard protocol without IV contrast. COMPARISON:  04/27/2019 FINDINGS: Lower chest: There is a hazy area of density at the right lung base posteriorly which has progressed, consistent with a pulmonary infarct. There is slight increased atelectasis at the left lung base posteriorly. Heart size is normal. Hepatobiliary: Prominent left lobe of the liver with a nodular contour of the liver suggesting cirrhosis. Multiple gallstones. No dilated bile ducts. Pancreas: Unremarkable. No pancreatic ductal dilatation or surrounding inflammatory changes. Spleen: Normal in size without focal abnormality. Adrenals/Urinary Tract: The adrenal glands and kidneys are normal. No hydronephrosis. The bladder is markedly compressed by the large pelvic hematoma. Stomach/Bowel: Stomach is within normal limits. Appendix is not visualized. No evidence of bowel wall thickening, distention, or inflammatory changes. The pelvic hematoma has a mass effect upon the adjacent bowel. Vascular/Lymphatic: No significant vascular findings are present. No enlarged abdominal or pelvic lymph nodes. Reproductive: Status post hysterectomy. No adnexal masses. Other: The  large pelvic hematoma is essentially unchanged since the prior study. There is minimally more prominent hemorrhage in the mesentery just above the pelvic hematoma. The hematoma appears to arise from the inferior aspect of the left rectus muscle. Musculoskeletal: No acute or significant osseous findings. IMPRESSION: 1. No significant change in the large pelvic hematoma. The hematoma appears  to arise from the inferior aspect of the left rectus muscle. 2. Slightly more prominent but slight a hemorrhage in the mesentery just above the pelvic hematoma. 3. 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 4. Probable cirrhosis. 5. Cholelithiasis. Electronically Signed   By: Lorriane Shire M.D.   On: 04/28/2019 14:44   CT ANGIO CHEST PE W OR WO CONTRAST  Result Date: 04/24/2019 CLINICAL DATA:  Hypoxemia, cough and shortness of breath. COVID-19 pneumonia. EXAM: CT ANGIOGRAPHY CHEST WITH CONTRAST TECHNIQUE: Multidetector CT imaging of the chest was performed using the standard protocol during bolus administration of intravenous contrast. Multiplanar CT image reconstructions and MIPs were obtained to evaluate the vascular anatomy. CONTRAST:  45m OMNIPAQUE IOHEXOL 350 MG/ML SOLN COMPARISON:  None. FINDINGS: Cardiovascular: --Pulmonary arteries: Contrast injection is sufficient to demonstrate satisfactory opacification of the pulmonary arteries to the segmental level, with attenuation of at least 200 HU at the main pulmonary artery. There is a large pulmonary embolus clot burden within both the right and left main pulmonary arteries extending into all proximal lobar branches. There is evidence of right heart strain with an RV to LV ratio of 1.65. the main pulmonary artery is within normal limits for size. --Aorta: Satisfactory opacification of the thoracic aorta. No aortic dissection or other acute aortic syndrome. Conventional 3 vessel aortic branching pattern. There is no aortic atherosclerosis. --Heart: Normal size.  No pericardial effusion. Mediastinum/Nodes: No mediastinal, hilar or axillary lymphadenopathy. The visualized thyroid and thoracic esophageal course are unremarkable. Lungs/Pleura: Mild ground-glass opacity without focal consolidation. Upper Abdomen: Contrast bolus timing is not optimized for evaluation of the abdominal organs. There are stones within the gallbladder. Musculoskeletal: No chest wall abnormality. No bony spinal canal stenosis. Review of the MIP images confirms the above findings. IMPRESSION: 1. Massive pulmonary embolus within both main pulmonary arteries extending into all proximal lobar branches with CT evidence of right heart strain (RV/LV Ratio = 1.65) consistent with at least submassive (intermediate risk) PE. The presence of right heart strain has been associated with an increased risk of morbidity and mortality. Please activate Code PE by paging [phone] 2. Multifocal pulmonary ground-glass opacity without specific evidence of infection 3. Cholelithiasis. Critical Value/emergent results were called by telephone at the time of interpretation on 04/24/2019 at 9:38 pm to provider K. KBaltazar Najjar who verbally acknowledged these results. Electronically Signed   By: KUlyses JarredM.D.   On: 04/24/2019 21:38   CT ABDOMEN PELVIS W CONTRAST  Result Date: 05/12/2019 CLINICAL DATA:  Lower abdominal pain, pelvic discomfort and constipation for 1 week, fatigue, history type II diabetes mellitus, GERD EXAM: CT ABDOMEN AND PELVIS WITH CONTRAST TECHNIQUE: Multidetector CT imaging of the abdomen and pelvis was performed using the standard protocol following bolus administration of intravenous contrast. Sagittal and coronal MPR images reconstructed from axial data set. CONTRAST:  856mOMNIPAQUE IOHEXOL 300 MG/ML SOLN IV. No oral contrast. COMPARISON:  04/28/2019 FINDINGS: Lower chest: Mild bibasilar atelectasis Hepatobiliary: Nodular cirrhotic appearing liver without focal mass. Elongated lateral segment LEFT  lobe extending to LEFT lateral abdominal wall. Multiple calcified gallstones dependently in gallbladder. No gallbladder wall thickening or pericholecystic infiltration by CT. Pancreas: Normal appearance Spleen: Normal appearance Adrenals/Urinary Tract: Adrenal glands, kidneys, and proximal ureters normal appearance. Distal ureters and bladder obscured due to a combination of large pelvic mass/collection and beam hardening artifacts from RIGHT hip prosthesis. Stomach/Bowel: Stomach decompressed. Bowel loops unremarkable. Short segment of probable normal appendix visualized. Vascular/Lymphatic: IVC filter. Aorta normal caliber. Vascular structures patent. No  adenopathy. Reproductive: Uterus surgically absent by history. Nonvisualization of ovaries. Other: Large collection identified within the pelvis, 15.6 x 16.9 x 10.0 cm consistent with large hematoma identified on previous exam. This displaces bowel and compresses urinary bladder. Collection extends into the LEFT rectus abdominus muscle question origin of hemorrhage. Overall little change in appearance since prior exams. No free air. No hernia. Musculoskeletal: Osseous structures unremarkable. IMPRESSION: No significant interval change in size of large pelvic hematoma, 15.6 x 16.9 x 10.0 cm, displacing bowel and compressing bladder; this likely originates from the LEFT rectus abdominus muscle. Probable cirrhotic liver. Cholelithiasis. Electronically Signed   By: Lavonia Dana M.D.   On: 05/12/2019 14:59   IR IVC FILTER PLMT / S&I Burke Keels GUID/MOD SED  Result Date: 04/29/2019 INDICATION: History of BEEFE-07 infection complicated by pulmonary embolism and lower extremity DVT further complicated by development of a retroperitoneal hematoma following the initiation anticoagulation. As such, patient is no longer a candidate for anticoagulation the given presence of lower extremity DVT, request made for placement of an IVC filter for temporary caval interruption  purposes. EXAM: ULTRASOUND GUIDANCE FOR VASCULAR ACCESS IVC CATHETERIZATION AND VENOGRAM IVC FILTER INSERTION COMPARISON:  CT abdomen pelvis-04/28/2019 MEDICATIONS: None. ANESTHESIA/SEDATION: Fentanyl 50 mcg IV; Versed 1 mg IV Sedation Time: 10 minutes; The patient was continuously monitored during the procedure by the interventional radiology nurse under my direct supervision. CONTRAST:  40 cc Isovue-300 FLUOROSCOPY TIME:  2 minutes, 30 seconds (12.1 mGy) COMPLICATIONS: None immediate PROCEDURE: Informed consent was obtained from the patient following explanation of the procedure, risks, benefits and alternatives. The patient understands, agrees and consents for the procedure. All questions were addressed. A time out was performed prior to the initiation of the procedure. Maximal barrier sterile technique utilized including caps, mask, sterile gowns, sterile gloves, large sterile drape, hand hygiene, and Betadine prep. Under sterile condition and local anesthesia, right internal jugular venous access was performed with ultrasound. An ultrasound image was saved and sent to PACS. Over a guidewire, the IVC filter delivery sheath and inner dilator were advanced into the IVC just above the IVC bifurcation. Contrast injection was performed for an IVC venogram. Through the delivery sheath, a retrievable Denali IVC filter was deployed below the level of the renal veins and above the IVC bifurcation. Limited post deployment venacavagram was performed. The delivery sheath was removed and hemostasis was obtained with manual compression. A dressing was placed. The patient tolerated the procedure well without immediate post procedural complication. FINDINGS: The IVC is patent. No evidence of thrombus, stenosis, or occlusion. No variant venous anatomy. Successful placement of the IVC filter below the level of the renal veins. Incidentally noted laminated gallstones overlying the right upper abdominal quadrant as demonstrated on  preceding abdominal CT. IMPRESSION: Successful ultrasound and fluoroscopically guided placement of an infrarenal retrievable IVC filter via right jugular approach. PLAN: This IVC filter is potentially retrievable. The patient will be assessed for filter retrieval by Interventional Radiology in approximately 8-12 weeks. Further recommendations regarding filter retrieval, continued surveillance or declaration of device permanence, will be made at that time. Electronically Signed   By: Sandi Mariscal M.D.   On: 04/29/2019 17:51   US Venous Img Lower Bilateral (DVT)  Addendum Date: 05/12/2019   ADDENDUM REPORT: 05/12/2019 13:23 ADDENDUM: Findings were discussed with Dr. Anastasio Champion by phone. Electronically Signed   By: Aletta Edouard M.D.   On: 05/12/2019 13:23   Result Date: 05/12/2019 CLINICAL DATA:  History of recent COVID-19 infection, bilateral pulmonary embolism and  left lower extremity DVT. IVC filter was placed on 04/29/2019 due to development of a large spontaneous pelvic hematoma after anticoagulation. Development of increased lower extremity edema, left greater than right, and pain. EXAM: BILATERAL LOWER EXTREMITY VENOUS DOPPLER ULTRASOUND TECHNIQUE: Gray-scale sonography with graded compression, as well as color Doppler and duplex ultrasound were performed to evaluate the lower extremity deep venous systems from the level of the common femoral vein and including the common femoral, femoral, profunda femoral, popliteal and calf veins including the posterior tibial, peroneal and gastrocnemius veins when visible. The superficial great saphenous vein was also interrogated. Spectral Doppler was utilized to evaluate flow at rest and with distal augmentation maneuvers in the common femoral, femoral and popliteal veins. COMPARISON:  None. FINDINGS: RIGHT LOWER EXTREMITY Common Femoral Vein: Occlusive thrombus present. Saphenofemoral Junction: Some thrombus appears to extend just into the upper segment of the GSV.  Profunda Femoral Vein: Occlusive thrombus present. Femoral Vein: Occlusive thrombus present. Popliteal Vein: Occlusive thrombus present. Calf Veins: Occlusive tibial thrombus in the visualized calf veins. Superficial Great Saphenous Vein: Thrombus visualized in the upper GSV in the thigh. Venous Reflux:  None. Other Findings:  None. LEFT LOWER EXTREMITY Common Femoral Vein: Occlusive thrombus present. Saphenofemoral Junction: Thrombus extends into the GSV. Profunda Femoral Vein: Occlusive thrombus present. Femoral Vein: Occlusive thrombus present. Popliteal Vein: Occlusive thrombus present. Calf Veins: Occlusive thrombus present. Superficial Great Saphenous Vein: Thrombus visualized in the upper GSV in the thigh. Venous Reflux:  None. Other Findings:  None. IMPRESSION: Significant progression of bilateral lower extremity DVT with occlusive thrombus throughout both lower extremities. In the setting of an indwelling IVC filter, there would potentially be some concern of additional iliac and IVC thrombosis extending to the level of the filter. Consider CT venography of the abdomen and pelvis with contrast for further evaluation. Electronically Signed: By: Aletta Edouard M.D. On: 05/12/2019 13:09   DG Chest Port 1 View  Result Date: 05/11/2019 CLINICAL DATA:  Shortness of breath EXAM: PORTABLE CHEST 1 VIEW COMPARISON:  April 24, 2019 FINDINGS: The heart size and mediastinal contours are within normal limits. Mildly increased hazy airspace opacity seen at the left lung base. The visualized skeletal structures are unremarkable. IMPRESSION: Mildly increased hazy airspace opacity at the left lung base which could be due to atelectasis and/or early infectious etiology. Electronically Signed   By: Prudencio Pair M.D.   On: 05/11/2019 00:27   DG CHEST PORT 1 VIEW  Result Date: 04/24/2019 CLINICAL DATA:  68 year old with COVID-19 pneumonia, now with worsening hypoxia. EXAM: PORTABLE CHEST 1 VIEW COMPARISON:   04/21/2019. FINDINGS: Cardiac silhouette upper normal in size to mildly enlarged, unchanged. Improved aeration in the lung bases, with residual mild streaky opacities at the LEFT base. No new pulmonary parenchymal abnormalities. Normal pulmonary vascularity. No visible pleural effusions. IMPRESSION: 1. Improved aeration in the lung bases, with residual mild streaky atelectasis and/or bronchopneumonia at the LEFT base. 2. No new abnormalities. Electronically Signed   By: Evangeline Dakin M.D.   On: 04/24/2019 17:07   DG Chest Port 1 View  Result Date: 04/21/2019 CLINICAL DATA:  68 year old female with shortness of breath. EXAM: PORTABLE CHEST 1 VIEW COMPARISON:  Chest radiograph report dated 02/28/2001. The images are not available for direct comparison. FINDINGS: Minimal bibasilar, left greater than right, densities likely atelectatic changes. Atypical infiltrate is less likely but not excluded. Clinical correlation is recommended. There is no focal consolidation, pleural effusion, pneumothorax. The cardiac silhouette is within normal limits. No acute osseous  pathology. IMPRESSION: Minimal bibasilar atelectasis.  No focal consolidation. Electronically Signed   By: Anner Crete M.D.   On: 04/21/2019 17:45   VAS [place] LOWER EXTREMITY VENOUS (DVT)  Result Date: 04/28/2019  Lower Venous Study Indications: Pulmonary embolism. Other Indications: Covid positive. Risk Factors: Large pelvic hematoma is noted which appears to originate from the. Limitations: Body habitus. Comparison Study: No prior study on file for comparison. Performing Technologist: Sharion Dove RVS  Examination Guidelines: A complete evaluation includes B-mode imaging, spectral Doppler, color Doppler, and power Doppler as needed of all accessible portions of each vessel. Bilateral testing is considered an integral part of a complete examination. Limited examinations for reoccurring indications may be performed as noted.   +---------+---------------+---------+-----------+----------+--------------+ RIGHT    CompressibilityPhasicitySpontaneityPropertiesThrombus Aging +---------+---------------+---------+-----------+----------+--------------+ CFV      Full           Yes      Yes                                 +---------+---------------+---------+-----------+----------+--------------+ SFJ      Full                                                        +---------+---------------+---------+-----------+----------+--------------+ FV Prox  Full                                                        +---------+---------------+---------+-----------+----------+--------------+ FV Mid   Full                                                        +---------+---------------+---------+-----------+----------+--------------+ FV DistalFull                                                        +---------+---------------+---------+-----------+----------+--------------+ PFV      Full                                                        +---------+---------------+---------+-----------+----------+--------------+ POP      Full           Yes      Yes                                 +---------+---------------+---------+-----------+----------+--------------+ PTV      Full                                                        +---------+---------------+---------+-----------+----------+--------------+  PERO     Full                                                        +---------+---------------+---------+-----------+----------+--------------+   +---------+---------------+---------+-----------+----------+-----------------+ LEFT     CompressibilityPhasicitySpontaneityPropertiesThrombus Aging    +---------+---------------+---------+-----------+----------+-----------------+ CFV      Full           Yes      Yes                                     +---------+---------------+---------+-----------+----------+-----------------+ SFJ      Full                                                           +---------+---------------+---------+-----------+----------+-----------------+ FV Prox  Full                                                           +---------+---------------+---------+-----------+----------+-----------------+ FV Mid   Full                                                           +---------+---------------+---------+-----------+----------+-----------------+ FV DistalFull                                                           +---------+---------------+---------+-----------+----------+-----------------+ PFV      Full                                                           +---------+---------------+---------+-----------+----------+-----------------+ POP      Full           No       No                                     +---------+---------------+---------+-----------+----------+-----------------+ PTV      Full                                         Age Indeterminate +---------+---------------+---------+-----------+----------+-----------------+ PERO     Full  Age Indeterminate +---------+---------------+---------+-----------+----------+-----------------+     Summary: Right: There is no evidence of deep vein thrombosis in the lower extremity. Left: Findings consistent with age indeterminate deep vein thrombosis involving the left posterior tibial veins, and left peroneal veins.  *See table(s) above for measurements and observations. Electronically signed by Servando Snare MD on 04/28/2019 at 10:55:03 AM.    Final    ECHOCARDIOGRAM LIMITED  Result Date: 04/25/2019   ECHOCARDIOGRAM LIMITED REPORT   Patient Name:   CYMONE YESKE Date of Exam: 04/25/2019 Medical Rec #:  [id number]         Height:       63.0 in Accession #:    [id number]         Weight:       246.5 lb Date of Birth:  04/06/1951         BSA:          2.11 m Patient Age:    69 years          BP:           126/70 mmHg Patient Gender: F                 HR:           79 bpm. Exam Location:  Inpatient  Procedure: Limited Echo, Limited Color Doppler and Cardiac Doppler STAT ECHO Indications:    pulmonary embolus 415.19  History:        Patient has no prior history of Echocardiogram examinations.  Sonographer:    Johny Chess Referring Phys: [id number] [place]  1. Left ventricular ejection fraction, by visual estimation, is 50 to 55%. The left ventricle has normal function. There is no left ventricular hypertrophy.  2. Left ventricular diastolic parameters are consistent with Grade I diastolic dysfunction (impaired relaxation).  3. The left ventricle has no regional wall motion abnormalities.  4. Global right ventricle has normal systolic function.The right ventricular size is normal.  5. Left atrial size was normal.  6. Right atrial size was normal.  7. The mitral valve is normal in structure. No evidence of mitral valve regurgitation. No evidence of mitral stenosis.  8. The tricuspid valve is normal in structure. Tricuspid valve regurgitation is trivial.  9. The aortic valve is tricuspid. Aortic valve regurgitation is not visualized. Mild aortic valve sclerosis without stenosis. 10. The pulmonic valve was normal in structure. Pulmonic valve regurgitation is not visualized. 11. Mildly elevated pulmonary artery systolic pressure. 12. The inferior vena cava is normal in size with greater than 50% respiratory variability, suggesting right atrial pressure of 3 mmHg. 13. Low normal LV systolic function; grade 1 diastolic dysfunction; moderate RV dysfunction. FINDINGS  Left Ventricle: Left ventricular ejection fraction, by visual estimation, is 50 to 55%. The left ventricle has normal function. The left ventricle has no regional wall motion abnormalities. There is no left ventricular  hypertrophy. Left ventricular diastolic parameters are consistent with Grade I diastolic dysfunction (impaired relaxation). Normal left atrial pressure. Right Ventricle: The right ventricular size is normal.Global RV systolic function is has normal systolic function. The tricuspid regurgitant velocity is 2.82 m/s, and with an assumed right atrial pressure of 3 mmHg, the estimated right ventricular systolic pressure is mildly elevated at 34.8 mmHg. Left Atrium: Left atrial size was normal in size. Right Atrium: Right atrial size was normal in size Pericardium: There is no evidence of pericardial effusion. Mitral Valve: The mitral valve is normal in structure. No evidence of  mitral valve stenosis by observation. MV Area by PHT, 4.31 cm. MV PHT, 51.04 msec. No evidence of mitral valve regurgitation. Tricuspid Valve: The tricuspid valve is normal in structure. Tricuspid valve regurgitation is trivial. Aortic Valve: The aortic valve is tricuspid. Aortic valve regurgitation is not visualized. Mild aortic valve sclerosis is present, with no evidence of aortic valve stenosis. Pulmonic Valve: The pulmonic valve was normal in structure. Pulmonic valve regurgitation is not visualized. Aorta: The aortic root is normal in size and structure. Venous: The inferior vena cava is normal in size with greater than 50% respiratory variability, suggesting right atrial pressure of 3 mmHg.  LEFT VENTRICLE          Normals PLAX 2D LVIDd:         4.10 cm  3.6 cm   Diastology                 Normals LVIDs:         3.20 cm  1.7 cm   LV e' lateral:   8.27 cm/s 6.42 cm/s LV PW:         0.90 cm  1.4 cm   LV E/e' lateral: 8.4       15.4 LV IVS:        1.00 cm  1.3 cm   LV e' medial:    6.85 cm/s 6.96 cm/s LVOT diam:     1.90 cm  2.0 cm   LV E/e' medial:  10.2      6.96 LV SV:         33 ml    79 ml LV SV Index:   14.51    45 ml/m2 LVOT Area:     2.84 cm 3.14 cm2  RIGHT VENTRICLE RV S prime:     15.60 cm/s TAPSE (M-mode): 2.1 cm LEFT ATRIUM          Index LA diam:    3.20 cm 1.51 cm/m  AORTIC VALVE             Normals LVOT Vmax:   78.30 cm/s LVOT Vmean:  46.200 cm/s 75 cm/s LVOT VTI:    0.134 m     25.3 cm  AORTA                 Normals Ao Root diam: 3.10 cm 31 mm MITRAL VALVE              Normals   TRICUSPID VALVE             Normals MV Area (PHT): 4.31 cm             TR Peak grad:   31.8 mmHg MV PHT:        51.04 msec 55 ms     TR Vmax:        282.00 cm/s 288 cm/s MV Decel Time: 176 msec   187 ms MV E velocity: 69.80 cm/s 103 cm/s  SHUNTS MV A velocity: 78.80 cm/s 70.3 cm/s Systemic VTI:  0.13 m MV E/A ratio:  0.89       1.5       Systemic Diam: 1.90 cm  Kirk Ruths MD Electronically signed by Kirk Ruths MD Signature Date/Time: 04/25/2019/3:06:36 PM    Final    CT Angio Abd/Pel w/ and/or w/o  Result Date: 04/27/2019 CLINICAL DATA:  Acute generalized abdominal pain. EXAM: CTA ABDOMEN AND PELVIS WITHOUT AND WITH CONTRAST TECHNIQUE: Multidetector CT imaging of the abdomen and pelvis was performed  using the standard protocol during bolus administration of intravenous contrast. Multiplanar reconstructed images and MIPs were obtained and reviewed to evaluate the vascular anatomy. CONTRAST:  159m OMNIPAQUE IOHEXOL 350 MG/ML SOLN COMPARISON:  None. FINDINGS: VASCULAR Aorta: Normal caliber aorta without aneurysm, dissection, vasculitis or significant stenosis. Celiac: Patent without evidence of aneurysm, dissection, vasculitis or significant stenosis. SMA: Patent without evidence of aneurysm, dissection, vasculitis or significant stenosis. Renals: Both renal arteries are patent without evidence of aneurysm, dissection, vasculitis, fibromuscular dysplasia or significant stenosis. IMA: Patent without evidence of aneurysm, dissection, vasculitis or significant stenosis. Inflow: Patent without evidence of aneurysm, dissection, vasculitis or significant stenosis. Proximal Outflow: Bilateral common femoral and visualized portions of the superficial and  profunda femoral arteries are patent without evidence of aneurysm, dissection, vasculitis or significant stenosis. Veins: No obvious venous abnormality within the limitations of this arterial phase study. Review of the MIP images confirms the above findings. NON-VASCULAR Lower chest: No acute abnormality. Hepatobiliary: Cholelithiasis is noted. No biliary dilatation is noted. Minimally nodular hepatic margins are noted suggesting possible early hepatic cirrhosis. Pancreas: Unremarkable. No pancreatic ductal dilatation or surrounding inflammatory changes. Spleen: Normal in size without focal abnormality. Adrenals/Urinary Tract: Adrenal glands and kidneys appear normal. No hydronephrosis is noted. Urinary bladder appears to be compressed by large pelvic hematoma. Stomach/Bowel: The stomach appears normal. There is no evidence of bowel obstruction or inflammation. The appendix is not visualized. Lymphatic: No adenopathy is noted. Reproductive: Status post hysterectomy. No adnexal masses. Other: There is a large complex lobulated density in the left in anterior portion of the pelvis which appears to arise from inferior portion of left rectal sheath, most consistent with hemorrhage. There is noted some hyperdensity along the medial portion of the left inferior rectus sheath which may represent contrast extravasation and possible active hemorrhage. Musculoskeletal: No acute or significant osseous findings. IMPRESSION: VASCULAR Large pelvic hematoma is noted which appears to originate from the inferior portion of the left rectus muscle, most consistent with spontaneous hemorrhage, potentially related to anticoagulation. This causes displacement and compression of the urinary bladder. There is seen some irregular high density along the medial portion of the left inferior rectus muscle which may represent contrast extravasation and possibly active hemorrhage. Critical Value/emergent results were called by telephone at the  time of interpretation on 04/27/2019 at 5:05 pm to pHavre, who verbally acknowledged these results. Mild cholelithiasis is noted. Minimally nodular hepatic margins are noted suggesting possible hepatic cirrhosis. Electronically Signed   By: JMarijo ConceptionM.D.   On: 04/27/2019 17:14    Labs:  CBC: Recent Labs    05/14/19 0756 05/14/19 2345 05/15/19 0915 05/15/19 1151  WBC 12.8* 12.2* 11.9* 11.7*  HGB 10.2* 9.8* 10.0* 9.6*  HCT 31.7* 31.3* 32.7* 29.7*  PLT 148* 168 161 173    COAGS: Recent Labs    04/27/19 2018 05/14/19 1200  INR 1.4* 1.2  APTT  --  32    BMP: Recent Labs    05/11/19 0316 05/12/19 0609 05/14/19 0756 05/14/19 2350  NA 131* 129* 128* 128*  K 5.3* 4.4 4.8 4.8  CL 101 98 100 102  CO2 16* 18* 17* 19*  GLUCOSE 220* 233* 227* 240*  BUN 21 27* 29* 25*  CALCIUM 9.1 9.5 9.1 8.8*  CREATININE 1.29* 1.19* 0.91 0.90  GFRNONAA 43* 47* >60 >60  GFRAA 50* 55* >60 >60    LIVER FUNCTION TESTS: Recent Labs    04/30/19 0320 05/10/19 2358 05/11/19 0316 05/12/19 08182  BILITOT 2.5* 2.3* 2.1* 2.2*  AST _0 ALT _1 ALKPHOS 42 86 77 76  PROT 6.4* 8.5* 7.9 8.0  ALBUMIN 4.8 4.2 3.9 3.7    TUMOR MARKERS: No results for input(s): AFPTM, CEA, CA199, CHROMGRNA in the last 8760 hours.  Assessment and Plan:  68 y/o F history of spontaneous retroperitoneal hematoma development while on anticoagulation and recent COVID-19 infection with DVT/PE development s/p IVC filter placement in IR on 04/29/19 who presented to [facility] ED on 12/25 with complaints of nausea, weakness, poor PO intake, cough and lower extremity pain. BLE [place] was performed due to complaints of lower extremity pain which showed significant progression of BLE DVT with occlusive thrombus throughout both lower extremities, concerning for additional iliac and IVC thrombosis extending to the level of the filter. She was transferred to [facility] for further care and vascular surgery was  consulted. She was noted to have adequate lower extremity perfusion and was not deemed a candidate for surgical intervention at this time. IR has been consulted for possible thrombectomy.  Patient was seen in conjunction with Dr. Corrie Mckusick today to follow up on symptoms following initiation of heparin gtt and to discuss further treatment options - patient appears to be tolerating heparin gtt currently with some improvement in her symptoms, however significant edema remains. We discussed post thrombotic syndrome and that it typically occurs in approximately 50% of people with DVT, however given the extensive clot burden her chances of developing post thrombotic syndrome are nearly 100%. 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 Options given to patient and her husband today included:   - continuing heparin gtt without thrombectomy and continue to monitor for s/s of bleeding - continue heparin gtt with thrombectomy tomorrow - continue heparin gtt without thrombectomy at this time and follow up with our service as an outpatient  After thorough discussion regarding the indications, risks, benefits and alternatives of percutaneous bilateral lower extremity/pelvic venogram with possible thrombectomy Ms. Surprenant and her husband have decided to proceed with thrombectomy tomorrow in IR.  Patient to be NPO after midnight, continue heparin gtt, continue to monitor for s/s of bleeding, increase NS to 125 mL/hr at midnight to encourage hydration prior to administration of contrast during procedure, AM labs pending, IR will call for patient when ready.  Please see Dr. Pasty Arch attestation for further details.   Risks and benefits discussed with the patient including, but not limited to bleeding, possible life threatening bleeding and need for blood  product transfusion, vascular injury, stroke, contrast induced renal failure, limb loss and infection.  All of the patient's questions were answered, patient is agreeable to proceed.  Consent signed and in chart.  Thank you for this interesting consult.  I greatly enjoyed meeting FARON WHITELOCK and look forward to participating in their care.  A copy of this report was sent to the requesting provider on this date.  Electronically Signed: Joaquim Nam, PA-C 05/15/2019, 2:40 PM   I spent a total of 40 Minutes  in face to face in clinical consultation, greater than 50% of which was counseling/coordinating care for bilateral lower extremity thrombectomy.

## 2019-05-15 NOTE — Progress Notes (Signed)
ANTICOAGULATION CONSULT NOTE - Follow Up Consult  Pharmacy Consult for Heparin + Cefepime Indication: BL PE with R heart strain and LLE DVT + PNA  No Known Allergies  Patient Measurements: Height: 5\' 5"  (165.1 cm) Weight: 235 lb (106.6 kg) IBW/kg (Calculated) : 57 Heparin Dosing Weight: 81.8 kg  Vital Signs: Temp: 98.7 F (37.1 C) (12/30 0407) Temp Source: Oral (12/30 0407) BP: 114/62 (12/30 0800) Pulse Rate: 100 (12/30 0407)  Labs: Recent Labs    05/14/19 0756 05/14/19 1200 05/14/19 2345 05/14/19 2346 05/14/19 2350 05/15/19 0713  HGB 10.2*  --  9.8*  --   --   --   HCT 31.7*  --  31.3*  --   --   --   PLT 148*  --  168  --   --   --   APTT  --  32  --   --   --   --   LABPROT  --  14.8  --   --   --   --   INR  --  1.2  --   --   --   --   HEPARINUNFRC  --   --   --  <0.10*  --  <0.10*  CREATININE 0.91  --   --   --  0.90  --     Estimated Creatinine Clearance: 73.5 mL/min (by C-G formula based on SCr of 0.9 mg/dL).  Assessment: Anticoag: SCDs. 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 Ddimer 0. HL <0.1. Hgb 9.8 down. Plts Stable. - 12/30: Discussion with Sarajane Jews, keep HL low, no bolus  ID: day# 5 Cefepime for HCAP; recent COVID PNA. - [employer], WBC 16.6>17.7>12.2 down,  LA 3.8, CRP 10.1, PCT <0.10, Ferritin WNL. Scr WNL  Vanc 12/26 >>12/29  Cefepime 12/26  >>   12/26 blood x 2: ng x 2 days to date  12/--  MRSA PCR:  sent?  12/-- sputum: sent?  - prior admit: 11/30 COVID positive    Remdesivir 12/6>>12/10    Dex 6mg  IV daily 12/6>>12/13  Goal of Therapy:  Heparin level 0.3-0.5 units/ml Monitor platelets by anticoagulation protocol: Yes   Plan:  Cefepime 2gm IV q12h. LOT? IV heparin (low) incr to 1450 units/hr. Recheck in 6 hr. Daily HL and  CBC  Catherine Oak S. Alford Highland, PharmD, BCPS Clinical Staff Pharmacist [email or web address]  Alford Highland, [place] 05/15/2019,9:09 AM

## 2019-05-15 NOTE — Progress Notes (Signed)
PROGRESS NOTE  Regina Munoz OZH:[id number] DOB: 03-Sep-1950 DOA: 05/10/2019 PCP: Hart RobinsonsButler, Cynthia P, DO   LOS: 4 days   Brief narrative: 68 year old woman discharged on 12/17 following treatment for Covid pneumonia diagnosed 11/30, hospital course complicated by worsening hypoxemia leading to CTA which revealed massive bilateral pulmonary embolism with RV strain, started on heparin infusion, then developed abdominal pain and CT revealed spontaneous left pelvic hematoma.  IR consulted for possible embolization but patient was stabilized after transfusion.  Left lower extremity venous ultrasound showed DVT.  IR was consulted and IVC filter placed 12/14.  She was subsequently discharged home on oxygen, no anticoagulation.  She was seen both by interventional radiology and pulmonology during that hospitalization.  Patient then presented on 12/25 with increasing fatigue.  Was admitted for severe sepsis from pneumonia, subsequently complained of increasing lower extremity edema and pain, ultrasound revealed extensive bilateral lower extremity DVTs with progression with concern for involvement into the pelvis and even to IVC filter.  She was transferred to [facility] for further evaluation.  She was seen by vascular surgery, but in the absence of anticoagulation, no intervention was recommended.  Interventional radiology and hematology were subsequently consulted and patient was started on heparin drip..  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  Patient presented with with tachycardia, tachypnea and lactic acidosis with acute hypoxic respiratory failure. Chest x-ray with  opacity left lung base.  Empirically started on vancomycin and cefepime due to history of recent Covid pneumonia. -- MRSA PCR was - December 8.    Vancomycin was discontinued.  Patient was subsequently weaned to room air.  Change to p.o. antibiotics by tomorrow to complete 7-day course.  Extensive, progressive bilateral lower extremity DVT with concern for progression to the iliac and IVC with history of IVC filter. Previous provider Dr Irene LimboGoodrich has spoken with interventional radiology Dr. Grace IsaacWatts IR and Dr. Pamelia HoitGudena, oncology who recommended challenging with heparin drip at this time.  Of note, patient had extensive hematoma that occurred spontaneously while on heparin infusion less than 3 weeks ago.  No evidence of external bleeding noted. If she tolerates anticoagulation, mechanical thrombectomy could be considered in the future if her condition fails to improve.  Patient was seen by interventional radiology Dr. Grace IsaacWatts on 05/14/2019 and recommended continuation of IV heparin and CT scan of the abdomen pelvis if worsening abdominal or back pain.  IR to follow.  Oncology on board and also recommend continuation of heparin drip.  Oncology to follow while in the hospital.  Submassive bilateral PE with right-sided heart strain by CT, diagnosed previous admission (discharged 05/02/2019).   Continue heparin drip for now.  Extensive large pelvic hematoma on previous hospitalization (discharged 12/17) displacing bowel and compressing bladder, likely originating from left rectus abdominis muscle. No significant interval change on CT 12/27 compared to previous study. Hemoglobin has remained stable, hemoglobin of 10.0 from 9.8.  Renal function normal with creatinine of 0.9  Thrombocytopenia, present since April 21, 2019.    Platelet today is 161.  Improved.  Will monitor daily.  Hyponatremia.   Sodium of 128 yesterday.  Check BMP in a.m.  Received IV fluid hydration.  Thought to be secondary to  hypovolemia.  Constipation.    Improved after enema and aggressive bowel regimen.  Pelvic hematoma can contribute to constipation.  Diabetes mellitus type 2 -Continue sliding scale insulin, diabetic diet.  Accu-Cheks..  Closely monitor.  Status post COVID-19 viral pneumonia treated with remdesivir and Decadron with discharge 12/17.  Initially Covid + 04/15/2019.   VTE Prophylaxis: Heparin drip  Code Status: Full code  Family Communication: None today  Disposition Plan: Home likely in 2 to 3 days.  Follow vascular surgery IR and oncology recommendation.  Low threshold for CT scan if hemoglobin continues to drop.   Consultants:  Hematooncology  Interventional radiology  Vascular surgery  Procedures:  None  Antibiotics: Anti-infectives (From admission, onward)   Start     Dose/Rate Route Frequency Ordered Stop   05/12/19 0000  vancomycin (VANCOCIN) IVPB 1000 mg/200 mL premix  Status:  Discontinued     1,000 mg 200 mL/hr over 60 Minutes Intravenous Every 24 hours 05/11/19 0108 05/14/19 1052   05/11/19 1200  ceFEPIme (MAXIPIME) 2 g in sodium chloride 0.9 % 100 mL IVPB     2 g 200 mL/hr over 30 Minutes Intravenous Every 12 hours 05/11/19 0611     05/11/19 0115  vancomycin (VANCOREADY) IVPB 2000 mg/400 mL     2,000 mg 200 mL/hr over 120 Minutes Intravenous  Once 05/11/19 0100 05/11/19 0350   05/11/19 0100  ceFEPIme (MAXIPIME) 1 g in sodium chloride 0.9 % 100 mL IVPB     1 g 200 mL/hr over 30 Minutes Intravenous  Once 05/11/19 0054 05/11/19 0443       Subjective: Today, patient denies any nausea, vomiting or abdominal pain.  Patient had a bowel movement with enema yesterday.  Denies any fever, chills or rigor.  Denies shortness of breath cough.  Objective: Vitals:   05/15/19 0700 05/15/19 0800  BP: (!) 115/53 114/62  Pulse:    Resp: (!) 22 (!) 22  Temp:    SpO2:      Intake/Output Summary (Last 24 hours) at 05/15/2019 1017 Last data filed at 05/15/2019  0407 Gross per 24 hour  Intake 480 ml  Output 850 ml  Net -370 ml   Filed Weights   05/10/19 2352  Weight: 106.6 kg   Body mass index is 39.11 kg/m.   Physical Exam: GENERAL: Patient is alert awake and oriented. Not in obvious distress.  Morbidly obese HENT: No scleral pallor or icterus. Pupils equally reactive to light. Oral mucosa is moist NECK: is supple, no palpable thyroid enlargement. CHEST: Clear to auscultation. No crackles or wheezes. Non tender on palpation. Diminished breath sounds bilaterally. CVS: S1 and S2 heard, no murmur. Regular rate and rhythm. No pericardial rub. ABDOMEN: Soft, non-tender, bowel sounds are present. No palpable hepato-splenomegaly. EXTREMITIES: Bilateral lower extremity edema noted with tenderness on palpation. CNS: Cranial nerves are intact. No focal motor or sensory deficits. SKIN: warm and dry without rashes.  Data Review: I have personally reviewed the following laboratory data and studies,  CBC: Recent Labs  Lab 05/10/19 2358 05/11/19 0316 05/12/19 0609 05/14/19 0756 05/14/19 2345  WBC 16.1* 16.6* 17.7* 12.8* 12.2*  NEUTROABS 12.3* 12.2*  --   --   --   HGB 11.7* 11.6* 10.8* 10.2* 9.8*  HCT 38.1 38.4 36.1 31.7* 31.3*  MCV 89.2 90.1 88.9 85.2 86.0  PLT 166 PLATELET CLUMPS NOTED ON SMEAR, COUNT APPEARS ADEQUATE 152 148* 168   Basic Metabolic Panel: Recent Labs  Lab 05/10/19 2358  05/11/19 0316 05/12/19 0609 05/14/19 0756 05/14/19 2350  NA 132* 131* 129* 128* 128*  K 4.9 5.3* 4.4 4.8 4.8  CL 99 101 98 100 102  CO2 19* 16* 18* 17* 19*  GLUCOSE 235* 220* 233* 227* 240*  BUN 19 21 27* 29* 25*  CREATININE 1.21* 1.29* 1.19* 0.91 0.90  CALCIUM 9.5 9.1 9.5 9.1 8.8*  MG  --   --  1.9  --   --   PHOS  --   --  3.3  --   --    Liver Function Tests: Recent Labs  Lab 05/10/19 2358 05/11/19 0316 05/12/19 0609  AST 22 23 17   ALT 16 13 13   ALKPHOS 86 77 76  BILITOT 2.3* 2.1* 2.2*  PROT 8.5* 7.9 8.0  ALBUMIN 4.2 3.9 3.7    No results for input(s): LIPASE, AMYLASE in the last 168 hours. No results for input(s): AMMONIA in the last 168 hours. Cardiac Enzymes: No results for input(s): CKTOTAL, CKMB, CKMBINDEX, TROPONINI in the last 168 hours. BNP (last 3 results) Recent Labs    04/24/19 0500 05/10/19 2358  BNP 193.7* 20.0    ProBNP (last 3 results) No results for input(s): PROBNP in the last 8760 hours.  CBG: Recent Labs  Lab 05/14/19 0616 05/14/19 1127 05/14/19 1622 05/14/19 2129 05/15/19 0635  GLUCAP 236* 273* 224* 318* 250*   Recent Results (from the past 240 hour(s))  Culture, blood (routine x 2) Call MD if unable to obtain prior to antibiotics being given     Status: None (Preliminary result)   Collection Time: 05/11/19  1:22 AM   Specimen: BLOOD LEFT HAND  Result Value Ref Range Status   Specimen Description BLOOD LEFT HAND  Final   Special Requests   Final    BOTTLES DRAWN AEROBIC AND ANAEROBIC Blood Culture adequate volume   Culture   Final    NO GROWTH 4 DAYS Performed at [facility], [address] [place], [place] [address]    Report Status PENDING  Incomplete  Culture, blood (routine x 2) Call MD if unable to obtain prior to antibiotics being given     Status: None (Preliminary result)   Collection Time: 05/11/19  3:16 AM   Specimen: BLOOD LEFT HAND  Result Value Ref Range Status   Specimen Description BLOOD LEFT HAND  Final   Special Requests   Final    BOTTLES DRAWN AEROBIC ONLY Blood Culture results may not be optimal due to an inadequate volume of blood received in culture bottles   Culture   Final    NO GROWTH 4 DAYS Performed at [facility], [address] [place], [place] [address]    Report Status PENDING  Incomplete     Studies: No results found.  Scheduled Meds: . dextromethorphan-guaiFENesin  1 tablet Oral BID  . feeding supplement (GLUCERNA SHAKE)  237 mL Oral TID BM  . insulin aspart  0-15 Units Subcutaneous TID WC  . insulin aspart  0-5 Units  Subcutaneous QHS  . pantoprazole  40 mg Oral Daily  . polyethylene glycol  17 g Oral BID  . senna-docusate  2 tablet Oral BID  . umeclidinium bromide  1 puff Inhalation Daily    Continuous Infusions: . sodium chloride 100 mL/hr at 05/14/19 1224  . ceFEPime (MAXIPIME) IV 2 g (05/15/19 0110)  . heparin 1,450 Units/hr (05/15/19 2330)     Flora Lipps, MD  [facility] 05/15/2019

## 2019-05-15 NOTE — Progress Notes (Signed)
PT Cancellation Note  Patient Details Name: Regina Munoz MRN: [id number] DOB: 03/10/1951   Cancelled Treatment:    Reason Eval/Treat Not Completed: Medical issues which prohibited therapy. Complicated course. Pt with extensive acute DVTs BLE.  Pt also with pelvic hematoma that spontaneously developed while on heparin approx 3 weeks ago. She has IVC filter. Heparin drip started last night along with order for CBC every 6 hours. Spoke with RN. PT to hold eval today to await 24 hours on heparin and ensure stable hgb prior to mobilizing.   Lorriane Shire 05/15/2019, 11:31 AM  Lorrin Goodell, PT  Office # [phone] Pager [phone]

## 2019-05-15 NOTE — Progress Notes (Signed)
HEMATOLOGY-ONCOLOGY PROGRESS NOTE  SUBJECTIVE: Reports she can move legs better today. Has not seen any bleeding. Denies abdominal pain.   REVIEW OF SYSTEMS:   Constitutional: Denies fevers, chills Eyes: Denies blurriness of vision Ears, nose, mouth, throat, and face: Denies mucositis or sore throat Respiratory: Denies cough, dyspnea or wheezes Cardiovascular: Denies palpitation, chest discomfort Gastrointestinal:  Denies nausea, heartburn  Skin: Denies abnormal skin rashes Lymphatics: Denies new lymphadenopathy or easy bruising Neurological:Denies numbness, tingling or new weaknesses Behavioral/Psych: Mood is stable, no new changes  Extremities: No lower extremity edema All other systems were reviewed with the patient and are negative.  I have reviewed the past medical history, past surgical history, social history and family history with the patient and they are unchanged from previous note.   PHYSICAL EXAMINATION:  Vitals:   05/15/19 1100 05/15/19 1123  BP: (!) 119/55   Pulse: 98 95  Resp: (!) 22 20  Temp:  98.1 F (36.7 C)  SpO2: 95% 96%   Filed Weights   05/10/19 2352  Weight: 235 lb (106.6 kg)    Intake/Output from previous day: 12/29 0701 - 12/30 0700 In: 480 [P.O.:480] Out: 850 [Urine:850]  GENERAL:alert, no distress and comfortable SKIN: skin color, texture, turgor are normal, no rashes or significant lesions EYES: normal, Conjunctiva are pink and non-injected, sclera clear OROPHARYNX:no exudate, no erythema and lips, buccal mucosa, and tongue normal  NECK: supple, thyroid normal size, non-tender, without nodularity LYMPH:  no palpable lymphadenopathy in the cervical, axillary or inguinal LUNGS: clear to auscultation and percussion with normal breathing effort HEART: regular rate & rhythm and no murmurs and 3+ lower extremity edema ABDOMEN:abdomen soft, non-tender and normal bowel sounds Musculoskeletal:no cyanosis of digits and no clubbing  NEURO: alert &  oriented x 3 with fluent speech, no focal motor/sensory deficits  LABORATORY DATA:  I have reviewed the data as listed CMP Latest Ref Rng & Units 05/14/2019 05/14/2019 05/12/2019  Glucose 70 - 99 mg/dL 981(X) 914(N) 829(F)  BUN 8 - 23 mg/dL 62(Z) 30(Q) 65(H)  Creatinine 0.44 - 1.00 mg/dL 8.46 9.62 9.52(W)  Sodium 135 - 145 mmol/L 128(L) 128(L) 129(L)  Potassium 3.5 - 5.1 mmol/L 4.8 4.8 4.4  Chloride 98 - 111 mmol/L 102 100 98  CO2 22 - 32 mmol/L 19(L) 17(L) 18(L)  Calcium 8.9 - 10.3 mg/dL 4.1(L) 9.1 9.5  Total Protein 6.5 - 8.1 g/dL - - 8.0  Total Bilirubin 0.3 - 1.2 mg/dL - - 2.2(H)  Alkaline Phos 38 - 126 U/L - - 76  AST 15 - 41 U/L - - 17  ALT 0 - 44 U/L - - 13    Lab Results  Component Value Date   WBC 11.7 (H) 05/15/2019   HGB 9.6 (L) 05/15/2019   HCT 29.7 (L) 05/15/2019   MCV 85.3 05/15/2019   PLT 173 05/15/2019   NEUTROABS 12.2 (H) 05/11/2019    CT ABDOMEN PELVIS WO CONTRAST  Result Date: 04/28/2019 CLINICAL DATA:  Follow-up of retroperitoneal hematoma. Pulmonary emboli. EXAM: CT ABDOMEN AND PELVIS WITHOUT CONTRAST TECHNIQUE: Multidetector CT imaging of the abdomen and pelvis was performed following the standard protocol without IV contrast. COMPARISON:  04/27/2019 FINDINGS: Lower chest: There is a hazy area of density at the right lung base posteriorly which has progressed, consistent with a pulmonary infarct. There is slight increased atelectasis at the left lung base posteriorly. Heart size is normal. Hepatobiliary: Prominent left lobe of the liver with a nodular contour of the liver suggesting cirrhosis. Multiple gallstones.  No dilated bile ducts. Pancreas: Unremarkable. No pancreatic ductal dilatation or surrounding inflammatory changes. Spleen: Normal in size without focal abnormality. Adrenals/Urinary Tract: The adrenal glands and kidneys are normal. No hydronephrosis. The bladder is markedly compressed by the large pelvic hematoma. Stomach/Bowel: Stomach is within  normal limits. Appendix is not visualized. No evidence of bowel wall thickening, distention, or inflammatory changes. The pelvic hematoma has a mass effect upon the adjacent bowel. Vascular/Lymphatic: No significant vascular findings are present. No enlarged abdominal or pelvic lymph nodes. Reproductive: Status post hysterectomy. No adnexal masses. Other: The large pelvic hematoma is essentially unchanged since the prior study. There is minimally more prominent hemorrhage in the mesentery just above the pelvic hematoma. The hematoma appears to arise from the inferior aspect of the left rectus muscle. Musculoskeletal: No acute or significant osseous findings. IMPRESSION: 1. No significant change in the large pelvic hematoma. The hematoma appears to arise from the inferior aspect of the left rectus muscle. 2. Slightly more prominent but slight a hemorrhage in the mesentery just above the pelvic hematoma. 3. 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 4. Probable cirrhosis. 5. Cholelithiasis. Electronically Signed   By: Francene Boyers M.D.   On: 04/28/2019 14:44   CT ANGIO CHEST PE W OR WO CONTRAST  Result Date: 04/24/2019 CLINICAL DATA:  Hypoxemia, cough and shortness of breath. COVID-19 pneumonia. EXAM: CT ANGIOGRAPHY CHEST WITH CONTRAST TECHNIQUE: Multidetector CT imaging of the chest was performed using the standard protocol during bolus administration of intravenous contrast. Multiplanar CT image reconstructions and MIPs were obtained to evaluate the vascular anatomy. CONTRAST:  75mL OMNIPAQUE IOHEXOL 350 MG/ML SOLN COMPARISON:  None. FINDINGS: Cardiovascular: --Pulmonary arteries: Contrast injection is sufficient to demonstrate satisfactory opacification of the pulmonary arteries to the segmental level, with attenuation of at least 200 HU at the main pulmonary artery. There is a large pulmonary embolus clot burden within both the right and left main pulmonary arteries extending into all proximal  lobar branches. There is evidence of right heart strain with an RV to LV ratio of 1.65. the main pulmonary artery is within normal limits for size. --Aorta: Satisfactory opacification of the thoracic aorta. No aortic dissection or other acute aortic syndrome. Conventional 3 vessel aortic branching pattern. There is no aortic atherosclerosis. --Heart: Normal size. No pericardial effusion. Mediastinum/Nodes: No mediastinal, hilar or axillary lymphadenopathy. The visualized thyroid and thoracic esophageal course are unremarkable. Lungs/Pleura: Mild ground-glass opacity without focal consolidation. Upper Abdomen: Contrast bolus timing is not optimized for evaluation of the abdominal organs. There are stones within the gallbladder. Musculoskeletal: No chest wall abnormality. No bony spinal canal stenosis. Review of the MIP images confirms the above findings. IMPRESSION: 1. Massive pulmonary embolus within both main pulmonary arteries extending into all proximal lobar branches with CT evidence of right heart strain (RV/LV Ratio = 1.65) consistent with at least submassive (intermediate risk) PE. The presence of right heart strain has been associated with an increased risk of morbidity and mortality. Please activate Code PE by paging [phone]. 2. Multifocal pulmonary ground-glass opacity without specific evidence of infection 3. Cholelithiasis. Critical Value/emergent results were called by telephone at the time of interpretation on 04/24/2019 at 9:38 pm to provider K. Craige Cotta, who verbally acknowledged these results. Electronically Signed   By: Deatra Robinson M.D.   On: 04/24/2019 21:38   CT ABDOMEN PELVIS W CONTRAST  Result Date: 05/12/2019 CLINICAL DATA:  Lower abdominal pain, pelvic discomfort and constipation for 1 week, fatigue, history type II diabetes mellitus,  GERD EXAM: CT ABDOMEN AND PELVIS WITH CONTRAST TECHNIQUE: Multidetector CT imaging of the abdomen and pelvis was performed using the standard protocol  following bolus administration of intravenous contrast. Sagittal and coronal MPR images reconstructed from axial data set. CONTRAST:  5mL OMNIPAQUE IOHEXOL 300 MG/ML SOLN IV. No oral contrast. COMPARISON:  04/28/2019 FINDINGS: Lower chest: Mild bibasilar atelectasis Hepatobiliary: Nodular cirrhotic appearing liver without focal mass. Elongated lateral segment LEFT lobe extending to LEFT lateral abdominal wall. Multiple calcified gallstones dependently in gallbladder. No gallbladder wall thickening or pericholecystic infiltration by CT. Pancreas: Normal appearance Spleen: Normal appearance Adrenals/Urinary Tract: Adrenal glands, kidneys, and proximal ureters normal appearance. Distal ureters and bladder obscured due to a combination of large pelvic mass/collection and beam hardening artifacts from RIGHT hip prosthesis. Stomach/Bowel: Stomach decompressed. Bowel loops unremarkable. Short segment of probable normal appendix visualized. Vascular/Lymphatic: IVC filter. Aorta normal caliber. Vascular structures patent. No adenopathy. Reproductive: Uterus surgically absent by history. Nonvisualization of ovaries. Other: Large collection identified within the pelvis, 15.6 x 16.9 x 10.0 cm consistent with large hematoma identified on previous exam. This displaces bowel and compresses urinary bladder. Collection extends into the LEFT rectus abdominus muscle question origin of hemorrhage. Overall little change in appearance since prior exams. No free air. No hernia. Musculoskeletal: Osseous structures unremarkable. IMPRESSION: No significant interval change in size of large pelvic hematoma, 15.6 x 16.9 x 10.0 cm, displacing bowel and compressing bladder; this likely originates from the LEFT rectus abdominus muscle. Probable cirrhotic liver. Cholelithiasis. Electronically Signed   By: Lavonia Dana M.D.   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 As such, patient is no longer a candidate for anticoagulation the given presence of lower extremity DVT, request made for placement of an IVC filter for temporary caval interruption purposes. EXAM: ULTRASOUND GUIDANCE FOR VASCULAR ACCESS IVC CATHETERIZATION AND VENOGRAM IVC FILTER INSERTION COMPARISON:  CT abdomen pelvis-04/28/2019 MEDICATIONS: None. ANESTHESIA/SEDATION: Fentanyl 50 mcg IV; Versed 1 mg IV Sedation Time: 10 minutes; The patient was continuously monitored during the procedure by the interventional radiology nurse under my direct supervision. CONTRAST:  40 cc Isovue-300 FLUOROSCOPY TIME:  2 minutes, 30 seconds (81.0 mGy) COMPLICATIONS: None immediate PROCEDURE: Informed consent was obtained from the patient following explanation of the procedure, risks, benefits and alternatives. The patient understands, agrees and consents for the procedure. All questions were addressed. A time out was performed prior to the initiation of the procedure. Maximal barrier sterile technique utilized including caps, mask, sterile gowns, sterile gloves, large sterile drape, hand hygiene, and Betadine prep. Under sterile condition and local anesthesia, right internal jugular venous access was performed with ultrasound. An ultrasound image was saved and sent to PACS. Over a guidewire, the IVC filter delivery sheath and inner dilator were advanced into the IVC just above the IVC bifurcation. Contrast injection was performed for an IVC venogram. Through the delivery sheath, a retrievable Denali IVC filter was deployed below the level of the renal veins and above the IVC bifurcation. Limited post deployment venacavagram was performed. The delivery sheath was removed and hemostasis was obtained with manual compression. A dressing was placed. The  patient tolerated the procedure well without immediate post procedural complication. FINDINGS: The IVC is patent. No evidence of thrombus, stenosis, or occlusion. No variant venous anatomy. Successful placement of  the IVC filter below the level of the renal veins. Incidentally noted laminated gallstones overlying the right upper abdominal quadrant as demonstrated on preceding abdominal CT. IMPRESSION: Successful ultrasound and fluoroscopically guided placement of an infrarenal retrievable IVC filter via right jugular approach. PLAN: This IVC filter is potentially retrievable. The patient will be assessed for filter retrieval by Interventional Radiology in approximately 8-12 weeks. Further recommendations regarding filter retrieval, continued surveillance or declaration of device permanence, will be made at that time. Electronically Signed   By: Simonne Come M.D.   On: 04/29/2019 17:51   US Venous Img Lower Bilateral (DVT)  Addendum Date: 05/12/2019   ADDENDUM REPORT: 05/12/2019 13:23 ADDENDUM: Findings were discussed with Dr. Karilyn Cota by phone. Electronically Signed   By: Irish Lack M.D.   On: 05/12/2019 13:23   Result Date: 05/12/2019 CLINICAL DATA:  History of recent COVID-19 infection, bilateral pulmonary embolism and left lower extremity DVT. IVC filter was placed on 04/29/2019 due to development of a large spontaneous pelvic hematoma after anticoagulation. Development of increased lower extremity edema, left greater than right, and pain. EXAM: BILATERAL LOWER EXTREMITY VENOUS DOPPLER ULTRASOUND TECHNIQUE: Gray-scale sonography with graded compression, as well as color Doppler and duplex ultrasound were performed to evaluate the lower extremity deep venous systems from the level of the common femoral vein and including the common femoral, femoral, profunda femoral, popliteal and calf veins including the posterior tibial, peroneal and gastrocnemius veins when visible. The superficial great saphenous  vein was also interrogated. Spectral Doppler was utilized to evaluate flow at rest and with distal augmentation maneuvers in the common femoral, femoral and popliteal veins. COMPARISON:  None. FINDINGS: RIGHT LOWER EXTREMITY Common Femoral Vein: Occlusive thrombus present. Saphenofemoral Junction: Some thrombus appears to extend just into the upper segment of the GSV. Profunda Femoral Vein: Occlusive thrombus present. Femoral Vein: Occlusive thrombus present. Popliteal Vein: Occlusive thrombus present. Calf Veins: Occlusive tibial thrombus in the visualized calf veins. Superficial Great Saphenous Vein: Thrombus visualized in the upper GSV in the thigh. Venous Reflux:  None. Other Findings:  None. LEFT LOWER EXTREMITY Common Femoral Vein: Occlusive thrombus present. Saphenofemoral Junction: Thrombus extends into the GSV. Profunda Femoral Vein: Occlusive thrombus present. Femoral Vein: Occlusive thrombus present. Popliteal Vein: Occlusive thrombus present. Calf Veins: Occlusive thrombus present. Superficial Great Saphenous Vein: Thrombus visualized in the upper GSV in the thigh. Venous Reflux:  None. Other Findings:  None. IMPRESSION: Significant progression of bilateral lower extremity DVT with occlusive thrombus throughout both lower extremities. In the setting of an indwelling IVC filter, there would potentially be some concern of additional iliac and IVC thrombosis extending to the level of the filter. Consider CT venography of the abdomen and pelvis with contrast for further evaluation. Electronically Signed: By: Irish Lack M.D. On: 05/12/2019 13:09   DG Chest Port 1 View  Result Date: 05/11/2019 CLINICAL DATA:  Shortness of breath EXAM: PORTABLE CHEST 1 VIEW COMPARISON:  April 24, 2019 FINDINGS: The heart size and mediastinal contours are within normal limits. Mildly increased hazy airspace opacity seen at the left lung base. The visualized skeletal structures are unremarkable. IMPRESSION: Mildly  increased hazy airspace opacity at the left lung base which could be due to atelectasis and/or early infectious etiology. Electronically Signed   By: Jonna Clark M.D.   On: 05/11/2019 00:27   DG CHEST PORT 1 VIEW  Result Date: 04/24/2019 CLINICAL DATA:  68 year old with COVID-19 pneumonia, now with worsening hypoxia. EXAM: PORTABLE CHEST 1 VIEW COMPARISON:  04/21/2019. FINDINGS:  Cardiac silhouette upper normal in size to mildly enlarged, unchanged. Improved aeration in the lung bases, with residual mild streaky opacities at the LEFT base. No new pulmonary parenchymal abnormalities. Normal pulmonary vascularity. No visible pleural effusions. IMPRESSION: 1. Improved aeration in the lung bases, with residual mild streaky atelectasis and/or bronchopneumonia at the LEFT base. 2. No new abnormalities. Electronically Signed   By: Hulan Saas M.D.   On: 04/24/2019 17:07   DG Chest Port 1 View  Result Date: 04/21/2019 CLINICAL DATA:  68 year old female with shortness of breath. EXAM: PORTABLE CHEST 1 VIEW COMPARISON:  Chest radiograph report dated 02/28/2001. The images are not available for direct comparison. FINDINGS: Minimal bibasilar, left greater than right, densities likely atelectatic changes. Atypical infiltrate is less likely but not excluded. Clinical correlation is recommended. There is no focal consolidation, pleural effusion, pneumothorax. The cardiac silhouette is within normal limits. No acute osseous pathology. IMPRESSION: Minimal bibasilar atelectasis.  No focal consolidation. Electronically Signed   By: Elgie Collard M.D.   On: 04/21/2019 17:45   VAS [place] LOWER EXTREMITY VENOUS (DVT)  Result Date: 04/28/2019  Lower Venous Study Indications: Pulmonary embolism. Other Indications: Covid positive. Risk Factors: Large pelvic hematoma is noted which appears to originate from the. Limitations: Body habitus. Comparison Study: No prior study on file for comparison. Performing Technologist:  Sherren Kerns RVS  Examination Guidelines: A complete evaluation includes B-mode imaging, spectral Doppler, color Doppler, and power Doppler as needed of all accessible portions of each vessel. Bilateral testing is considered an integral part of a complete examination. Limited examinations for reoccurring indications may be performed as noted.  +---------+---------------+---------+-----------+----------+--------------+ RIGHT    CompressibilityPhasicitySpontaneityPropertiesThrombus Aging +---------+---------------+---------+-----------+----------+--------------+ CFV      Full           Yes      Yes                                 +---------+---------------+---------+-----------+----------+--------------+ SFJ      Full                                                        +---------+---------------+---------+-----------+----------+--------------+ FV Prox  Full                                                        +---------+---------------+---------+-----------+----------+--------------+ FV Mid   Full                                                        +---------+---------------+---------+-----------+----------+--------------+ FV DistalFull                                                        +---------+---------------+---------+-----------+----------+--------------+ PFV      Full                                                        +---------+---------------+---------+-----------+----------+--------------+  POP      Full           Yes      Yes                                 +---------+---------------+---------+-----------+----------+--------------+ PTV      Full                                                        +---------+---------------+---------+-----------+----------+--------------+ PERO     Full                                                        +---------+---------------+---------+-----------+----------+--------------+    +---------+---------------+---------+-----------+----------+-----------------+ LEFT     CompressibilityPhasicitySpontaneityPropertiesThrombus Aging    +---------+---------------+---------+-----------+----------+-----------------+ CFV      Full           Yes      Yes                                    +---------+---------------+---------+-----------+----------+-----------------+ SFJ      Full                                                           +---------+---------------+---------+-----------+----------+-----------------+ FV Prox  Full                                                           +---------+---------------+---------+-----------+----------+-----------------+ FV Mid   Full                                                           +---------+---------------+---------+-----------+----------+-----------------+ FV DistalFull                                                           +---------+---------------+---------+-----------+----------+-----------------+ PFV      Full                                                           +---------+---------------+---------+-----------+----------+-----------------+ POP      Full           No  No                                     +---------+---------------+---------+-----------+----------+-----------------+ PTV      Full                                         Age Indeterminate +---------+---------------+---------+-----------+----------+-----------------+ PERO     Full                                         Age Indeterminate +---------+---------------+---------+-----------+----------+-----------------+     Summary: Right: There is no evidence of deep vein thrombosis in the lower extremity. Left: Findings consistent with age indeterminate deep vein thrombosis involving the left posterior tibial veins, and left peroneal veins.  *See table(s) above for measurements and observations.  Electronically signed by Lemar LivingsBrandon Cain MD on 04/28/2019 at 10:55:03 AM.    Final    ECHOCARDIOGRAM LIMITED  Result Date: 04/25/2019   ECHOCARDIOGRAM LIMITED REPORT   Patient Name:   Regina Munoz Date of Exam: 04/25/2019 Medical Rec #:  [id number]         Height:       63.0 in Accession #:    [id number]        Weight:       246.5 lb Date of Birth:  1950/12/08         BSA:          2.11 m Patient Age:    68 years          BP:           126/70 mmHg Patient Gender: F                 HR:           79 bpm. Exam Location:  Inpatient  Procedure: Limited Echo, Limited Color Doppler and Cardiac Doppler STAT ECHO Indications:    pulmonary embolus 415.19  History:        Patient has no prior history of Echocardiogram examinations.  Sonographer:    Delcie RochLauren Pennington Referring Phys: [id number] CAROLE N HALL IMPRESSIONS  1. Left ventricular ejection fraction, by visual estimation, is 50 to 55%. The left ventricle has normal function. There is no left ventricular hypertrophy.  2. Left ventricular diastolic parameters are consistent with Grade I diastolic dysfunction (impaired relaxation).  3. The left ventricle has no regional wall motion abnormalities.  4. Global right ventricle has normal systolic function.The right ventricular size is normal.  5. Left atrial size was normal.  6. Right atrial size was normal.  7. The mitral valve is normal in structure. No evidence of mitral valve regurgitation. No evidence of mitral stenosis.  8. The tricuspid valve is normal in structure. Tricuspid valve regurgitation is trivial.  9. The aortic valve is tricuspid. Aortic valve regurgitation is not visualized. Mild aortic valve sclerosis without stenosis. 10. The pulmonic valve was normal in structure. Pulmonic valve regurgitation is not visualized. 11. Mildly elevated pulmonary artery systolic pressure. 12. The inferior vena cava is normal in size with greater than 50% respiratory variability, suggesting right atrial pressure of 3 mmHg.  13. Low normal LV systolic function; grade 1 diastolic dysfunction; moderate RV dysfunction. FINDINGS  Left Ventricle: Left ventricular ejection fraction, by visual estimation, is 50 to 55%. The left ventricle has normal function. The left ventricle has no regional wall motion abnormalities. There is no left ventricular hypertrophy. Left ventricular diastolic parameters are consistent with Grade I diastolic dysfunction (impaired relaxation). Normal left atrial pressure. Right Ventricle: The right ventricular size is normal.Global RV systolic function is has normal systolic function. The tricuspid regurgitant velocity is 2.82 m/s, and with an assumed right atrial pressure of 3 mmHg, the estimated right ventricular systolic pressure is mildly elevated at 34.8 mmHg. Left Atrium: Left atrial size was normal in size. Right Atrium: Right atrial size was normal in size Pericardium: There is no evidence of pericardial effusion. Mitral Valve: The mitral valve is normal in structure. No evidence of mitral valve stenosis by observation. MV Area by PHT, 4.31 cm. MV PHT, 51.04 msec. No evidence of mitral valve regurgitation. Tricuspid Valve: The tricuspid valve is normal in structure. Tricuspid valve regurgitation is trivial. Aortic Valve: The aortic valve is tricuspid. Aortic valve regurgitation is not visualized. Mild aortic valve sclerosis is present, with no evidence of aortic valve stenosis. Pulmonic Valve: The pulmonic valve was normal in structure. Pulmonic valve regurgitation is not visualized. Aorta: The aortic root is normal in size and structure. Venous: The inferior vena cava is normal in size with greater than 50% respiratory variability, suggesting right atrial pressure of 3 mmHg.  LEFT VENTRICLE          Normals PLAX 2D LVIDd:         4.10 cm  3.6 cm   Diastology                 Normals LVIDs:         3.20 cm  1.7 cm   LV e' lateral:   8.27 cm/s 6.42 cm/s LV PW:         0.90 cm  1.4 cm   LV E/e' lateral: 8.4        15.4 LV IVS:        1.00 cm  1.3 cm   LV e' medial:    6.85 cm/s 6.96 cm/s LVOT diam:     1.90 cm  2.0 cm   LV E/e' medial:  10.2      6.96 LV SV:         33 ml    79 ml LV SV Index:   14.51    45 ml/m2 LVOT Area:     2.84 cm 3.14 cm2  RIGHT VENTRICLE RV S prime:     15.60 cm/s TAPSE (M-mode): 2.1 cm LEFT ATRIUM         Index LA diam:    3.20 cm 1.51 cm/m  AORTIC VALVE             Normals LVOT Vmax:   78.30 cm/s LVOT Vmean:  46.200 cm/s 75 cm/s LVOT VTI:    0.134 m     25.3 cm  AORTA                 Normals Ao Root diam: 3.10 cm 31 mm MITRAL VALVE              Normals   TRICUSPID VALVE             Normals MV Area (PHT): 4.31 cm             TR Peak grad:   31.8 mmHg MV PHT:  51.04 msec 55 ms     TR Vmax:        282.00 cm/s 288 cm/s MV Decel Time: 176 msec   187 ms MV E velocity: 69.80 cm/s 103 cm/s  SHUNTS MV A velocity: 78.80 cm/s 70.3 cm/s Systemic VTI:  0.13 m MV E/A ratio:  0.89       1.5       Systemic Diam: 1.90 cm  Olga Millers MD Electronically signed by Olga Millers MD Signature Date/Time: 04/25/2019/3:06:36 PM    Final    CT Angio Abd/Pel w/ and/or w/o  Result Date: 04/27/2019 CLINICAL DATA:  Acute generalized abdominal pain. EXAM: CTA ABDOMEN AND PELVIS WITHOUT AND WITH CONTRAST TECHNIQUE: Multidetector CT imaging of the abdomen and pelvis was performed using the standard protocol during bolus administration of intravenous contrast. Multiplanar reconstructed images and MIPs were obtained and reviewed to evaluate the vascular anatomy. CONTRAST:  OMNIPAQUE IOHEXOL 350 MG/ML SOLN COMPARISON:  None. FINDINGS: VASCULAR Aorta: Normal caliber aorta without aneurysm, dissection, vasculitis or significant stenosis. Celiac: Patent without evidence of aneurysm, dissection, vasculitis or significant stenosis. SMA: Patent without evidence of aneurysm, dissection, vasculitis or significant stenosis. Renals: Both renal arteries are patent without evidence of aneurysm, dissection, vasculitis,  fibromuscular dysplasia or significant stenosis. IMA: Patent without evidence of aneurysm, dissection, vasculitis or significant stenosis. Inflow: Patent without evidence of aneurysm, dissection, vasculitis or significant stenosis. Proximal Outflow: Bilateral common femoral and visualized portions of the superficial and profunda femoral arteries are patent without evidence of aneurysm, dissection, vasculitis or significant stenosis. Veins: No obvious venous abnormality within the limitations of this arterial phase study. Review of the MIP images confirms the above findings. NON-VASCULAR Lower chest: No acute abnormality. Hepatobiliary: Cholelithiasis is noted. No biliary dilatation is noted. Minimally nodular hepatic margins are noted suggesting possible early hepatic cirrhosis. Pancreas: Unremarkable. No pancreatic ductal dilatation or surrounding inflammatory changes. Spleen: Normal in size without focal abnormality. Adrenals/Urinary Tract: Adrenal glands and kidneys appear normal. No hydronephrosis is noted. Urinary bladder appears to be compressed by large pelvic hematoma. Stomach/Bowel: The stomach appears normal. There is no evidence of bowel obstruction or inflammation. The appendix is not visualized. Lymphatic: No adenopathy is noted. Reproductive: Status post hysterectomy. No adnexal masses. Other: There is a large complex lobulated density in the left in anterior portion of the pelvis which appears to arise from inferior portion of left rectal sheath, most consistent with hemorrhage. There is noted some hyperdensity along the medial portion of the left inferior rectus sheath which may represent contrast extravasation and possible active hemorrhage. Musculoskeletal: No acute or significant osseous findings. IMPRESSION: VASCULAR Large pelvic hematoma is noted which appears to originate from the inferior portion of the left rectus muscle, most consistent with spontaneous hemorrhage, potentially related to  anticoagulation. This causes displacement and compression of the urinary bladder. There is seen some irregular high density along the medial portion of the left inferior rectus muscle which may represent contrast extravasation and possibly active hemorrhage. Critical Value/emergent results were called by telephone at the time of interpretation on 04/27/2019 at 5:05 pm to providerCAROLE HALL , who verbally acknowledged these results. Mild cholelithiasis is noted. Minimally nodular hepatic margins are noted suggesting possible hepatic cirrhosis. Electronically Signed   By: Lupita Raider M.D.   On: 04/27/2019 17:14    ASSESSMENT AND PLAN: 1. Extensive, progressive bilateral lower extremity DVT 2. Recent diagnosis of submassive bilateral PE 3. Retroperitoneal hematoma that occurred spontaneously while on heparin 4. Recent COVID-19  pneumonia 5.  Mild thrombocytopenia 6.  Mild normocytic anemia  -Has resumed heparin with no signs of bleeding or recurrent RP hematoma. Continue heparin.  -Hemoglobin has remained stable since initiation of heparin. CBC is being checked every 6 hours. If this remains stable, can decrease frequency to every 12 hours.    LOS: 4 days   Clenton Pare, DNP, AGPCNP-BC, AOCNP 05/15/19

## 2019-05-15 NOTE — Care Management Important Message (Signed)
Important Message  Patient Details  Name: KHUSHBU PIPPEN MRN: [id number] Date of Birth: 1950/09/27   Medicare Important Message Given:  Yes     Shelda Altes 05/15/2019, 12:59 PM

## 2019-05-15 NOTE — Progress Notes (Signed)
ANTICOAGULATION CONSULT NOTE - Follow Up Consult  Pharmacy Consult for Heparin  Indication: BL PE with R heart strain and LLE DVT   No Known Allergies  Patient Measurements: Height: 5\' 5"  (165.1 cm) Weight: 235 lb (106.6 kg) IBW/kg (Calculated) : 57 Heparin Dosing Weight: 81.8 kg  Vital Signs: Temp: 98.5 F (36.9 C) (12/30 1700) Temp Source: Oral (12/30 1700) BP: 115/55 (12/30 1700) Pulse Rate: 96 (12/30 1700)  Labs: Recent Labs    05/14/19 0756 05/14/19 1200 05/14/19 2345 05/14/19 2346 05/14/19 2350 05/15/19 0713 05/15/19 0915 05/15/19 1151 05/15/19 1630  HGB 10.2*  --  9.8*  --   --   --  10.0* 9.6*  --   HCT 31.7*  --  31.3*  --   --   --  32.7* 29.7*  --   PLT 148*  --  168  --   --   --  161 173  --   APTT  --  32  --   --   --   --   --   --   --   LABPROT  --  14.8  --   --   --   --   --   --   --   INR  --  1.2  --   --   --   --   --   --   --   HEPARINUNFRC  --   --   --  <0.10*  --  <0.10*  --   --  0.21*  CREATININE 0.91  --   --   --  0.90  --   --   --   --     Estimated Creatinine Clearance: 73.5 mL/min (by C-G formula based on SCr of 0.9 mg/dL).  Assessment: Anticoag: SCDs. 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 Ddimer 0. HL <0.1. Hgb 9.8 down. Plts Stable. - 12/30: Discussion with Sarajane Jews, keep HL low, no bolus  P.M. update: heparin level low at 0.21. Will make small rate adjustment tonight and follow up repeat level in am. No bleeding issues per nursing.   Possible mechanical thrombectomy tomorrow.   Goal of Therapy:  Heparin level 0.3-0.5 units/ml Monitor platelets by anticoagulation protocol: Yes   Plan:  IV heparin increased to 1600 units/hr.  Daily HL and CBC   Erin Hearing PharmD., BCPS Clinical Pharmacist 05/15/2019 5:40  PM

## 2019-05-15 NOTE — Progress Notes (Signed)
[place] for heparin Indication:History of BL PEs/ DVT - new  No Known Allergies  Patient Measurements: Height: 5\' 5"  (165.1 cm) Weight: 235 lb (106.6 kg) IBW/kg (Calculated) : 57 Heparin Dosing Weight: 78kg  Vital Signs: BP: 111/63 (12/30 0100)  Labs: Recent Labs    05/12/19 0609 05/14/19 0756 05/14/19 1200 05/14/19 2345 05/14/19 2346 05/14/19 2350  HGB 10.8* 10.2*  --  9.8*  --   --   HCT 36.1 31.7*  --  31.3*  --   --   PLT 152 148*  --  168  --   --   APTT  --   --  32  --   --   --   LABPROT  --   --  14.8  --   --   --   INR  --   --  1.2  --   --   --   HEPARINUNFRC  --   --   --   --  <0.10*  --   CREATININE 1.19* 0.91  --   --   --  0.90    Estimated Creatinine Clearance: 73.5 mL/min (by C-G formula based on SCr of 0.9 mg/dL).   Medical History: Past Medical History:  Diagnosis Date  . Anxiety   . Arthritis   . Asthma   . Complication of anesthesia   . COVID-19   . Diabetes mellitus without complication ([place])   . Dysrhythmia    palpitations ->20 yrs stress test neg nothing since  . GERD (gastroesophageal reflux disease)   . Headache   . PONV (postoperative nausea and vomiting)    Assessment: 68 year old female with recent bilateral PEs, now with extensive bilateral DVT. Patient was placed on heparin last admit and had spontaneous retroperitoneal /abdominal wall bleed. Discussed with Dr.Goodrich heparin dosing, will not bolus and aim for low end of goal for at least the first 24 hours. Can consider escalating dose if she tolerates tomorrow.    Heparin level <0.1 units/ml.  No issues noted with infusion.  No bleeding noted  Goal of Therapy:  Heparin level ~0.3-0.4 units/ml Monitor platelets by anticoagulation protocol: Yes   Plan:  Increase heparin to 1200 units/hr Check heparin level 6 hours after rate change  Excell Seltzer, PharmD Clinical Pharmacist 05/15/2019 1:05 AM

## 2019-05-15 NOTE — Progress Notes (Signed)
Inpatient Diabetes Program Recommendations  AACE/ADA: New Consensus Statement on Inpatient Glycemic Control (2015)  Target Ranges:  Prepandial:   less than 140 mg/dL      Peak postprandial:   less than 180 mg/dL (1-2 hours)      Critically ill patients:  140 - 180 mg/dL   Lab Results  Component Value Date   GLUCAP 214 (H) 05/15/2019   HGBA1C 7.8 (H) 04/22/2019    Review of Glycemic Control Results for Regina Munoz, Regina Munoz (MRN [id number]) as of 05/15/2019 16:04  Ref. Range 05/14/2019 11:27 05/14/2019 16:22 05/14/2019 21:29 05/15/2019 06:35 05/15/2019 11:09  Glucose-Capillary Latest Ref Range: 70 - 99 mg/dL 273 (H) 224 (H) 318 (H) 250 (H) 214 (H)   Diabetes history: DM 2 Outpatient Diabetes medications: Lantus 10 units daily, Metformin 500 mg bid Current orders for Inpatient glycemic control:  Novolog moderate tid with meals and HS  Inpatient Diabetes Program Recommendations:    Please add Lantus 10 units daily.   Thanks  Adah Perl, RN, BC-ADM Inpatient Diabetes Coordinator Pager [phone] (8a-5p)

## 2019-05-16 ENCOUNTER — Inpatient Hospital Stay (HOSPITAL_COMMUNITY): Payer: PPO

## 2019-05-16 HISTORY — PX: IR VENOCAVAGRAM IVC: IMG678

## 2019-05-16 HISTORY — PX: IR THROMBECT SEC MECH MOD SED: IMG2299

## 2019-05-16 HISTORY — PX: IR VENO/EXT/BI: IMG677

## 2019-05-16 HISTORY — PX: IR US GUIDE VASC ACCESS LEFT: IMG2389

## 2019-05-16 HISTORY — PX: IR US GUIDE VASC ACCESS RIGHT: IMG2390

## 2019-05-16 HISTORY — PX: IR THROMBECT VENO MECH MOD SED: IMG2300

## 2019-05-16 HISTORY — PX: IR PTA VENOUS EXCEPT DIALYSIS CIRCUIT: IMG6126

## 2019-05-16 LAB — CULTURE, BLOOD (ROUTINE X 2)
Culture: NO GROWTH
Culture: NO GROWTH
Special Requests: ADEQUATE

## 2019-05-16 LAB — COMPREHENSIVE METABOLIC PANEL
ALT: 16 U/L (ref 0–44)
AST: 24 U/L (ref 15–41)
Albumin: 2.6 g/dL — ABNORMAL LOW (ref 3.5–5.0)
Alkaline Phosphatase: 87 U/L (ref 38–126)
Anion gap: 8 (ref 5–15)
BUN: 20 mg/dL (ref 8–23)
CO2: 19 mmol/L — ABNORMAL LOW (ref 22–32)
Calcium: 8.7 mg/dL — ABNORMAL LOW (ref 8.9–10.3)
Chloride: 104 mmol/L (ref 98–111)
Creatinine, Ser: 0.85 mg/dL (ref 0.44–1.00)
GFR calc Af Amer: >60 mL/min (ref >60)
GFR calc non Af Amer: >60 mL/min (ref >60)
Glucose, Bld: 177 mg/dL — ABNORMAL HIGH (ref 70–99)
Potassium: 4.3 mmol/L (ref 3.5–5.1)
Sodium: 131 mmol/L — ABNORMAL LOW (ref 135–145)
Total Bilirubin: 1 mg/dL (ref 0.3–1.2)
Total Protein: 6.4 g/dL — ABNORMAL LOW (ref 6.5–8.1)

## 2019-05-16 LAB — CBC
HCT: 30.2 % — ABNORMAL LOW (ref 36.0–46.0)
Hemoglobin: 9.7 g/dL — ABNORMAL LOW (ref 12.0–15.0)
MCH: 27.4 pg (ref 26.0–34.0)
MCHC: 32.1 g/dL (ref 30.0–36.0)
MCV: 85.3 fL (ref 80.0–100.0)
Platelets: 192 10*3/uL (ref 150–400)
RBC: 3.54 MIL/uL — ABNORMAL LOW (ref 3.87–5.11)
RDW: 20.5 % — ABNORMAL HIGH (ref 11.5–15.5)
WBC: 12.4 10*3/uL — ABNORMAL HIGH (ref 4.0–10.5)
nRBC: 0.2 % (ref 0.0–0.2)

## 2019-05-16 LAB — POCT ACTIVATED CLOTTING TIME
Activated Clotting Time: 114 seconds
Activated Clotting Time: 213 seconds
Activated Clotting Time: 230 seconds

## 2019-05-16 LAB — SURGICAL PCR SCREEN
MRSA, PCR: NEGATIVE
Staphylococcus aureus: NEGATIVE

## 2019-05-16 LAB — HEPARIN LEVEL (UNFRACTIONATED): Heparin Unfractionated: 0.48 IU/mL (ref 0.30–0.70)

## 2019-05-16 LAB — MAGNESIUM: Magnesium: 2 mg/dL (ref 1.7–2.4)

## 2019-05-16 LAB — GLUCOSE, CAPILLARY
Glucose-Capillary: 168 mg/dL — ABNORMAL HIGH (ref 70–99)
Glucose-Capillary: 160 mg/dL — ABNORMAL HIGH (ref 70–99)
Glucose-Capillary: 149 mg/dL — ABNORMAL HIGH (ref 70–99)
Glucose-Capillary: 186 mg/dL — ABNORMAL HIGH (ref 70–99)
Glucose-Capillary: 195 mg/dL — ABNORMAL HIGH (ref 70–99)

## 2019-05-16 LAB — PROTIME-INR
INR: 1 (ref 0.8–1.2)
Prothrombin Time: 13.5 seconds (ref 11.4–15.2)

## 2019-05-16 MED ORDER — MIDAZOLAM HCL 2 MG/2ML IJ SOLN
INTRAMUSCULAR | Status: AC
  Filled 2019-05-16: qty 2

## 2019-05-16 MED ORDER — HEPARIN SODIUM (PORCINE) 1000 UNIT/ML IJ SOLN
INTRAMUSCULAR | Status: AC
  Filled 2019-05-16: qty 1

## 2019-05-16 MED ORDER — SODIUM CHLORIDE 0.9 % IV BOLUS: 500.0000 mL | Freq: Once | INTRAVENOUS | Status: AC

## 2019-05-16 MED ORDER — FENTANYL CITRATE (PF) 100 MCG/2ML IJ SOLN
INTRAMUSCULAR | Status: DC | PRN
  Administered 2019-05-16 (×2): 25 ug via INTRAVENOUS
  Administered 2019-05-16: 50 ug via INTRAVENOUS
  Administered 2019-05-16 (×4): 25 ug via INTRAVENOUS

## 2019-05-16 MED ORDER — FENTANYL CITRATE (PF) 100 MCG/2ML IJ SOLN
INTRAMUSCULAR | Status: AC
  Filled 2019-05-16: qty 2

## 2019-05-16 MED ORDER — IOHEXOL 300 MG/ML  SOLN
150.0000 mL | Freq: Once | INTRAMUSCULAR | Status: AC | PRN
  Administered 2019-05-16: 100 mL via INTRAVENOUS

## 2019-05-16 MED ORDER — LIDOCAINE HCL 1 % IJ SOLN
INTRAMUSCULAR | Status: DC | PRN
  Administered 2019-05-16: 20 mL

## 2019-05-16 MED ORDER — CHLORHEXIDINE GLUCONATE 4 % EX LIQD
CUTANEOUS | Status: AC
  Filled 2019-05-16: qty 15

## 2019-05-16 MED ORDER — MIDAZOLAM HCL 2 MG/2ML IJ SOLN
INTRAMUSCULAR | Status: DC | PRN
  Administered 2019-05-16: 0.5 mg via INTRAVENOUS
  Administered 2019-05-16: 1 mg via INTRAVENOUS
  Administered 2019-05-16: 0.5 mg via INTRAVENOUS
  Administered 2019-05-16 (×4): 1 mg via INTRAVENOUS

## 2019-05-16 MED ORDER — LIDOCAINE HCL (PF) 1 % IJ SOLN
INTRAMUSCULAR | Status: AC
  Filled 2019-05-16: qty 30

## 2019-05-16 MED ORDER — HEPARIN SODIUM (PORCINE) 1000 UNIT/ML IJ SOLN
INTRAMUSCULAR | Status: DC | PRN
  Administered 2019-05-16 (×3): 4000 [IU] via INTRAVENOUS
  Administered 2019-05-16: 10000 [IU] via INTRAVENOUS

## 2019-05-16 NOTE — Progress Notes (Signed)
PT Cancellation Note  Patient Details Name: Regina Munoz MRN: [id number] DOB: 27-Jan-1951   Cancelled Treatment:     Pt has now been on heparin > 24 hr and heparin value is .48; however, pt NPO and scheduled for thrombectomy in a couple hours.  She declined PT at this time.  Will follow up as able. Maggie Font, PT Acute Rehab Services Pager [phone] [facility] [phone]    Karlton Lemon 05/16/2019, 12:05 PM

## 2019-05-16 NOTE — Progress Notes (Signed)
ANTICOAGULATION CONSULT NOTE   Pharmacy Consult for Heparin  Indication: PE/DVT   No Known Allergies  Patient Measurements: Height: 5\' 5"  (165.1 cm) Weight: 235 lb (106.6 kg) IBW/kg (Calculated) : 57 Heparin Dosing Weight: 81.8 kg  Vital Signs: Temp: 98.7 F (37.1 C) (12/30 2040) Temp Source: Oral (12/30 2040) BP: 124/62 (12/31 0000) Pulse Rate: 95 (12/31 0000)  Labs: Recent Labs    05/14/19 0756 05/14/19 1200 05/14/19 2345 05/14/19 2346 05/14/19 2350 05/15/19 0713 05/15/19 1151 05/15/19 1630 05/15/19 1807 05/16/19 0230  HGB 10.2*  --   --    < >  --   --  9.6*  --  9.9* 9.7*  HCT 31.7*  --   --    < >  --   --  29.7*  --  31.6* 30.2*  PLT 148*  --   --    < >  --   --  173  --  183 192  APTT  --  32  --   --   --   --   --   --   --   --   LABPROT  --  14.8  --   --   --   --   --   --   --   --   INR  --  1.2  --   --   --   --   --   --   --   --   HEPARINUNFRC  --   --    < >  --   --  <0.10*  --  0.21*  --  0.48  CREATININE 0.91  --   --   --  0.90  --   --   --   --   --    < > = values in this interval not displayed.    Estimated Creatinine Clearance: 73.5 mL/min (by C-G formula based on SCr of 0.9 mg/dL).  Assessment: 68 y.o. female with worsening BLE DVT awaiting thrombectomy for heparin  Goal of Therapy:  Heparin level 0.3-0.5 units/ml Monitor platelets by anticoagulation protocol: Yes   Plan:  Continue Heparin at current rate   Phillis Knack, PharmD, BCPS  05/16/2019 3:03 AM

## 2019-05-16 NOTE — Sedation Documentation (Signed)
Patient is resting comfortably. 

## 2019-05-16 NOTE — Sedation Documentation (Signed)
Patient is resting

## 2019-05-16 NOTE — Progress Notes (Signed)
PROGRESS NOTE  Regina Munoz WGN:[id number] DOB: 1950-12-28 DOA: 05/10/2019 PCP: Hart RobinsonsButler, Cynthia P, DO   LOS: 5 days   Brief narrative: 68 year old woman discharged on 12/17 following treatment for Covid pneumonia diagnosed 11/30, hospital course complicated by worsening hypoxemia leading to CTA which revealed massive bilateral pulmonary embolism with RV strain, started on heparin infusion, then developed abdominal pain and CT revealed spontaneous left pelvic hematoma.  IR consulted for possible embolization but patient was stabilized after transfusion.  Left lower extremity venous ultrasound showed DVT.  IR was consulted and IVC filter placed 12/14.  She was subsequently discharged home on oxygen, no anticoagulation.  She was seen both by interventional radiology and pulmonology during that hospitalization.  Patient then presented on 12/25 with increasing fatigue.  Patient was admitted for severe sepsis from pneumonia, subsequently complained of increasing lower extremity edema and pain, ultrasound revealed extensive bilateral lower extremity DVTs with progression with concern for involvement into the pelvis and even to IVC filter.  [facility].  She was seen by vascular surgery, but in the absence of anticoagulation, no intervention was recommended.  Interventional radiology and hematology were subsequently consulted and patient was started on heparin drip.  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  Patient presented with with tachycardia, tachypnea and lactic acidosis with acute hypoxic respiratory failure. Chest  x-ray with opacity left lung base.    Patient was empirically started on vancomycin and cefepime due to history of recent Covid pneumonia. -- MRSA PCR was negative on December 8.    Vancomycin was discontinued.  Patient was subsequently weaned to room air but today on [place] 2 Litres/min. Will closely monitor.  We will continue IV antibiotics for now.  Aim for 7-day course.  Blood cultures negative at 5 days  Extensive, progressive bilateral lower extremity DVT with concern for progression to the iliac and IVC with history of IVC filter. Dr. Grace IsaacWatts IR and Dr. Pamelia HoitGudena, oncology on board and patient was started on heparin drip.  Hemoglobin remained stable on heparin drip.  IR followed the patient and the plan is mechanical thrombectomy today.    Submassive bilateral PE with right-sided heart strain by CT, diagnosed previous admission (discharged 05/02/2019).   Continue heparin drip for now.  Plan for mechanical thrombectomy today, patient has IVC filter in place  Extensive large pelvic hematoma on previous hospitalization (discharged 12/17) displacing bowel and compressing bladder, likely originating from left rectus abdominis muscle. No significant interval change on CT 12/27 compared to previous study. Hemoglobin has remained stable, hemoglobin of 9.7 from 9.7 despite being on heparin drip.Marland Kitchen.  Renal function normal with creatinine of 0.9  Thrombocytopenia, on admission    Platelet today is 192.  Improved.  Will monitor daily.  Hyponatremia.   Sodium of 131 from 128..  Will closely monitor.  Constipation.    Improved after enema and aggressive bowel regimen.    Diabetes mellitus type 2 -Continue sliding scale insulin, diabetic diet.  Accu-Cheks..  Closely monitor.  Status post COVID-19 viral pneumonia treated with remdesivir and Decadron with discharge 12/17.  Initially Covid + 04/15/2019.  VTE Prophylaxis:  Heparin drip  Code Status: Full code  Family Communication: I spoke with the patient's  discussed Mr. Nicki Furlan on the phone and updated him about the clinical condition of the patient.  Answered queries he had.  Disposition Plan: Home likely in 2 to 3 days. For IR intervention today i.e mechanical thrombectomy.  Physical therapy evaluation after intervention.  Consultants:  Hematooncology  Interventional radiology  Vascular surgery  Procedures:  None  Antibiotics: Anti-infectives (From admission, onward)   Start     Dose/Rate Route Frequency Ordered Stop   05/12/19 0000  vancomycin (VANCOCIN) IVPB 1000 mg/200 mL premix  Status:  Discontinued     1,000 mg 200 mL/hr over 60 Minutes Intravenous Every 24 hours 05/11/19 0108 05/14/19 1052   05/11/19 1200  ceFEPIme (MAXIPIME) 2 g in sodium chloride 0.9 % 100 mL IVPB     2 g 200 mL/hr over 30 Minutes Intravenous Every 12 hours 05/11/19 0611     05/11/19 0115  vancomycin (VANCOREADY) IVPB 2000 mg/400 mL     2,000 mg 200 mL/hr over 120 Minutes Intravenous  Once 05/11/19 0100 05/11/19 0350   05/11/19 0100  ceFEPIme (MAXIPIME) 1 g in sodium chloride 0.9 % 100 mL IVPB     1 g 200 mL/hr over 30 Minutes Intravenous  Once 05/11/19 0054 05/11/19 0443       Subjective: Today, patient denies interval complaints.  Denies increasing shortness of breath.  Denies cough fever or chest pain.  Has a bilateral lower extremity pain and tenderness.  Has had a bowel movement.  Objective: Vitals:   05/16/19 0400 05/16/19 0500  BP: (!) 117/56 (!) 106/54  Pulse: 100 96  Resp: (!) 22 19  Temp:  98.4 F (36.9 C)  SpO2: 93% 96%    Intake/Output Summary (Last 24 hours) at 05/16/2019 0739 Last data filed at 05/16/2019 0509 Gross per 24 hour  Intake 2633.65 ml  Output 1020 ml  Net 1613.65 ml   Filed Weights   05/10/19 2352  Weight: 106.6 kg   Body mass index is 39.11 kg/m.   Physical Exam:  GENERAL: Patient is alert awake and oriented. Not in obvious distress.  Morbidly obese HENT: No scleral pallor or icterus. Pupils  equally reactive to light. Oral mucosa is moist NECK: is supple, no palpable thyroid enlargement. CHEST: Clear to auscultation. No crackles or wheezes. Non tender on palpation. Diminished breath sounds bilaterally. CVS: S1 and S2 heard, no murmur. Regular rate and rhythm. No pericardial rub. ABDOMEN: Soft, non-tender, bowel sounds are present. No palpable hepato-splenomegaly. EXTREMITIES: Bilateral lower extremity pitting edema noted with tenderness on palpation. CNS: Cranial nerves are intact. No focal motor or sensory deficits. SKIN: warm and dry without rashes.  Data Review: I have personally reviewed the following laboratory data and studies,  CBC: Recent Labs  Lab 05/10/19 2358 05/11/19 0316 05/14/19 2345 05/15/19 0915 05/15/19 1151 05/15/19 1807 05/16/19 0230  WBC 16.1* 16.6* 12.2* 11.9* 11.7* 12.2* 12.4*  NEUTROABS 12.3* 12.2*  --   --   --   --   --   HGB 11.7* 11.6* 9.8* 10.0* 9.6* 9.9* 9.7*  HCT 38.1 38.4 31.3* 32.7* 29.7* 31.6* 30.2*  MCV 89.2 90.1 86.0 88.4 85.3 86.1 85.3  PLT 166 PLATELET CLUMPS NOTED ON SMEAR, COUNT APPEARS ADEQUATE 168 161 173 183 440   Basic Metabolic Panel: Recent Labs  Lab 05/11/19 0316 05/12/19 0609 05/14/19 0756 05/14/19 2350 05/16/19 0230  NA 131* 129* 128* 128* 131*  K 5.3* 4.4  4.8 4.8 4.3  CL 101 98 100 102 104  CO2 16* 18* 17* 19* 19*  GLUCOSE 220* 233* 227* 240* 177*  BUN 21 27* 29* 25* 20  CREATININE 1.29* 1.19* 0.91 0.90 0.85  CALCIUM 9.1 9.5 9.1 8.8* 8.7*  MG  --  1.9  --   --  2.0  PHOS  --  3.3  --   --   --    Liver Function Tests: Recent Labs  Lab 05/10/19 2358 05/11/19 0316 05/12/19 0609 05/16/19 0230  AST ALT ALKPHOS 86 77 76 87  BILITOT 2.3* 2.1* 2.2* 1.0  PROT 8.5* 7.9 8.0 6.4*  ALBUMIN 4.2 3.9 3.7 2.6*   No results for input(s): LIPASE, AMYLASE in the last 168 hours. No results for input(s): AMMONIA in the last 168 hours. Cardiac Enzymes: No results for input(s): CKTOTAL,  CKMB, CKMBINDEX, TROPONINI in the last 168 hours. BNP (last 3 results) Recent Labs    04/24/19 0500 05/10/19 2358  BNP 193.7* 20.0    ProBNP (last 3 results) No results for input(s): PROBNP in the last 8760 hours.  CBG: Recent Labs  Lab 05/15/19 0635 05/15/19 1109 05/15/19 1615 05/15/19 2151 05/16/19 0615  GLUCAP 250* 214* 189* 245* 168*   Recent Results (from the past 240 hour(s))  Culture, blood (routine x 2) Call MD if unable to obtain prior to antibiotics being given     Status: None (Preliminary result)   Collection Time: 05/11/19  1:22 AM   Specimen: BLOOD LEFT HAND  Result Value Ref Range Status   Specimen Description BLOOD LEFT HAND  Final   Special Requests   Final    BOTTLES DRAWN AEROBIC AND ANAEROBIC Blood Culture adequate volume   Culture   Final    NO GROWTH 4 DAYS Performed at [facility], [address], [place], [place] [address]    Report Status PENDING  Incomplete  Culture, blood (routine x 2) Call MD if unable to obtain prior to antibiotics being given     Status: None (Preliminary result)   Collection Time: 05/11/19  3:16 AM   Specimen: BLOOD LEFT HAND  Result Value Ref Range Status   Specimen Description BLOOD LEFT HAND  Final   Special Requests   Final    BOTTLES DRAWN AEROBIC ONLY Blood Culture results may not be optimal due to an inadequate volume of blood received in culture bottles   Culture   Final    NO GROWTH 4 DAYS Performed at [facility], [address] [place], [place] [address]    Report Status PENDING  Incomplete     Studies: No results found.  Scheduled Meds: . ascorbic acid  500 mg Oral Daily  . cholecalciferol  1,000 Units Oral Daily  . dextromethorphan-guaiFENesin  1 tablet Oral BID  . feeding supplement (GLUCERNA SHAKE)  237 mL Oral TID BM  . insulin aspart  0-15 Units Subcutaneous TID WC  . insulin aspart  0-5 Units Subcutaneous QHS  . insulin glargine  10 Units Subcutaneous Daily  . loratadine  10 mg Oral Daily    . pantoprazole  40 mg Oral Daily  . polyethylene glycol  17 g Oral BID  . senna-docusate  2 tablet Oral BID  . umeclidinium bromide  1 puff Inhalation Daily  . zinc sulfate  220 mg Oral Daily    Continuous Infusions: . sodium chloride Stopped (05/16/19 0022)  . sodium chloride 125 mL/hr at  05/16/19 0509  . ceFEPime (MAXIPIME) IV 2 g (05/16/19 0236)  . heparin 1,600 Units/hr (05/15/19 2200)     Joycelyn Das, MD  [facility] 05/16/2019

## 2019-05-16 NOTE — Sedation Documentation (Signed)
Vital signs stable. Pt is moaning in pain.

## 2019-05-16 NOTE — Procedures (Signed)
Interventional Radiology Procedure Note  Procedure:    US guided right IJ access  US guided bilateral popliteal access.  Mechanical/aspiration thrombectomy of IVC, bilateral iliac, bilateral femoral veins with Inari clottriever/flow/triever devices.   Findings:  Initial -- complete iliocaval, iliofemoral thrombosis  Final -- patent IVC, bilateral iliac, bilateral femoral veins  Complications: None  EBL: ~200cc  Recommendations:  - Continue heparin as tolerated, with goal of transition to anti-coagulation, per Heme recommendations - bilateral popliteal access with compression dressings.  Keep dressings in place overnight.  Knees straight overnight - right ij access with dressing in place - advance diet per primary team, ok with vir - Do not submerge - Routine care  - VIR will follow - Follow up in clinic after DC home with repeat duplex in 4-6 weeks with Dr. Earleen Munoz or Dr. Pascal Lux  Signed,  Regina Fanny. Earleen Newport, DO

## 2019-05-16 NOTE — Sedation Documentation (Signed)
Vital signs stable. 

## 2019-05-16 NOTE — Progress Notes (Addendum)
Pt's alert and oriented x 4. Denied pain. Surgical wound bilateral legs dry and clean. HR 110-120, sinus tachycardia on monitor.BP 89/59- 106/45 mmHg. SPO2 96% on room air.   Notified on-call provider, Jeannette Corpus, NP. Order received for stat lab CBC and Lactic acid. 0.9% NSS 500 ml bolus given. Will continue to monitor.   Kennyth Lose, RN

## 2019-05-16 NOTE — Sedation Documentation (Signed)
Pt is resting at this time. Procedure is finished and she tolerated it very well. IR tech cleaning sites at this time and applying dressings.

## 2019-05-17 LAB — CBC WITH DIFFERENTIAL/PLATELET
Abs Immature Granulocytes: 0 10*3/uL (ref 0.00–0.07)
Abs Immature Granulocytes: 1.16 10*3/uL — ABNORMAL HIGH (ref 0.00–0.07)
Basophils Absolute: 0.1 10*3/uL (ref 0.0–0.1)
Basophils Absolute: 0.1 10*3/uL (ref 0.0–0.1)
Basophils Relative: 1 %
Basophils Relative: 1 %
Eosinophils Absolute: 0.1 10*3/uL (ref 0.0–0.5)
Eosinophils Absolute: 0.2 10*3/uL (ref 0.0–0.5)
Eosinophils Relative: 1 %
Eosinophils Relative: 1 %
HCT: 24.9 % — ABNORMAL LOW (ref 36.0–46.0)
HCT: 26.8 % — ABNORMAL LOW (ref 36.0–46.0)
Hemoglobin: 7.8 g/dL — ABNORMAL LOW (ref 12.0–15.0)
Hemoglobin: 8.3 g/dL — ABNORMAL LOW (ref 12.0–15.0)
Immature Granulocytes: 9 %
Lymphocytes Relative: 16 %
Lymphocytes Relative: 23 %
Lymphs Abs: 2.1 10*3/uL (ref 0.7–4.0)
Lymphs Abs: 2.8 10*3/uL (ref 0.7–4.0)
MCH: 27.4 pg (ref 26.0–34.0)
MCH: 27.6 pg (ref 26.0–34.0)
MCHC: 31 g/dL (ref 30.0–36.0)
MCHC: 31.3 g/dL (ref 30.0–36.0)
MCV: 87.4 fL (ref 80.0–100.0)
MCV: 89 fL (ref 80.0–100.0)
Monocytes Absolute: 0.4 10*3/uL (ref 0.1–1.0)
Monocytes Absolute: 1.3 10*3/uL — ABNORMAL HIGH (ref 0.1–1.0)
Monocytes Relative: 10 %
Monocytes Relative: 3 %
Neutro Abs: 8.1 10*3/uL — ABNORMAL HIGH (ref 1.7–7.7)
Neutro Abs: 8.7 10*3/uL — ABNORMAL HIGH (ref 1.7–7.7)
Neutrophils Relative %: 63 %
Neutrophils Relative %: 72 %
Platelets: 185 10*3/uL (ref 150–400)
Platelets: 186 10*3/uL (ref 150–400)
RBC: 2.85 MIL/uL — ABNORMAL LOW (ref 3.87–5.11)
RBC: 3.01 MIL/uL — ABNORMAL LOW (ref 3.87–5.11)
RDW: 19.9 % — ABNORMAL HIGH (ref 11.5–15.5)
RDW: 20.6 % — ABNORMAL HIGH (ref 11.5–15.5)
WBC: 12.1 10*3/uL — ABNORMAL HIGH (ref 4.0–10.5)
WBC: 12.9 10*3/uL — ABNORMAL HIGH (ref 4.0–10.5)
nRBC: 0 /100 WBC
nRBC: 0.2 % (ref 0.0–0.2)
nRBC: 0.4 % — ABNORMAL HIGH (ref 0.0–0.2)

## 2019-05-17 LAB — CBC
HCT: 24.2 % — ABNORMAL LOW (ref 36.0–46.0)
Hemoglobin: 7.4 g/dL — ABNORMAL LOW (ref 12.0–15.0)
MCH: 27 pg (ref 26.0–34.0)
MCHC: 30.6 g/dL (ref 30.0–36.0)
MCV: 88.3 fL (ref 80.0–100.0)
Platelets: 180 10*3/uL (ref 150–400)
RBC: 2.74 MIL/uL — ABNORMAL LOW (ref 3.87–5.11)
RDW: 20.7 % — ABNORMAL HIGH (ref 11.5–15.5)
WBC: 13.9 10*3/uL — ABNORMAL HIGH (ref 4.0–10.5)
nRBC: 0.2 % (ref 0.0–0.2)

## 2019-05-17 LAB — GLUCOSE, CAPILLARY
Glucose-Capillary: 149 mg/dL — ABNORMAL HIGH (ref 70–99)
Glucose-Capillary: 156 mg/dL — ABNORMAL HIGH (ref 70–99)
Glucose-Capillary: 149 mg/dL — ABNORMAL HIGH (ref 70–99)
Glucose-Capillary: 183 mg/dL — ABNORMAL HIGH (ref 70–99)

## 2019-05-17 LAB — HEPARIN LEVEL (UNFRACTIONATED)
Heparin Unfractionated: 0.9 IU/mL — ABNORMAL HIGH (ref 0.30–0.70)
Heparin Unfractionated: 0.63 IU/mL (ref 0.30–0.70)
Heparin Unfractionated: 0.29 IU/mL — ABNORMAL LOW (ref 0.30–0.70)

## 2019-05-17 LAB — LACTIC ACID, PLASMA: Lactic Acid, Venous: 1.7 mmol/L (ref 0.5–1.9)

## 2019-05-17 LAB — PREPARE RBC (CROSSMATCH)

## 2019-05-17 MED ORDER — SODIUM CHLORIDE 0.9% IV SOLUTION: Freq: Once | INTRAVENOUS | Status: AC

## 2019-05-17 MED ORDER — SODIUM CHLORIDE 0.9 % IV SOLN
2.0000 g | Freq: Two times a day (BID) | INTRAVENOUS | Status: AC
  Administered 2019-05-17 – 2019-05-19 (×4): 2 g via INTRAVENOUS
  Filled 2019-05-17 (×4): qty 2

## 2019-05-17 NOTE — Progress Notes (Signed)
Referring Physician(s): Dr. Sarajane Jews  Supervising Physician: Sandi Mariscal  Patient Status:  [facility] - In-pt  Chief Complaint: Follow up mechanical/aspiration thrombectomy of IVC, bilateral iliac, bilateral femoral veins 05/16/19 with Dr. Earleen Newport and Dr. Pascal Lux  Subjective:  Patient seen sitting up in bed waiting to use toilet chair, husband is at bedside. She states she feels pretty good and was sitting up in the recliner earlier today without issue. She continues to have BLE pain but less than before procedure, mostly sore being popliteal access sites. She denies any pain elsewhere. She thinks her leg swelling as improved somewhat, she is enjoying wearing the SCDs stating "they feel like little massagers." Has not noted any bleeding from puncture sites, in stool or urine. Tolerating PO intake without n/v.   Allergies: Patient has no known allergies.  Medications: Prior to Admission medications   Medication Sig Start Date End Date Taking? Authorizing Provider  acetaminophen (TYLENOL) 500 MG tablet Take 1,000 mg by mouth every 6 (six) hours as needed for mild pain or moderate pain.    Yes [provider]  albuterol (VENTOLIN HFA) 108 (90 Base) MCG/ACT inhaler Inhale 2 puffs into the lungs every 4 (four) hours as needed for wheezing or shortness of breath. 05/02/19  Yes Regalado, Belkys A, MD  ascorbic acid (VITAMIN C) 500 MG tablet Take 1 tablet (500 mg total) by mouth daily. 05/02/19  Yes Regalado, Belkys A, MD  blood glucose meter kit and supplies KIT Dispense based on patient and insurance preference. Use up to four times daily as directed. (FOR ICD-9 250.00, 250.01). 05/02/19  Yes Regalado, Belkys A, MD  buPROPion (ZYBAN) 150 MG 12 hr tablet Take 75 mg by mouth every morning.  03/28/19  Yes [provider]  cetirizine (ZYRTEC) 10 MG tablet Take 10 mg by mouth daily. 02/13/19  Yes [provider]  cholecalciferol (VITAMIN D) 1000 UNITS tablet Take 1,000 Units by  mouth daily.   Yes [provider]  dextromethorphan-guaiFENesin (MUCINEX DM) 30-600 MG 12hr tablet Take 1 tablet by mouth 2 (two) times daily. 05/02/19  Yes Regalado, Belkys A, MD  diclofenac Sodium (VOLTAREN) 1 % GEL Apply 2 g topically daily as needed (for pain).    Yes [provider]  Insulin Glargine (LANTUS) 100 UNIT/ML Solostar Pen Inject 10 Units into the skin daily. 05/02/19  Yes Regalado, Belkys A, MD  Insulin Pen Needle (PEN NEEDLES 3/16") 31G X 5 MM MISC 1 application by Does not apply route daily. 05/02/19  Yes Regalado, Belkys A, MD  metFORMIN (GLUCOPHAGE) 500 MG tablet Take 500 mg by mouth 2 (two) times daily with a meal.    Yes [provider]  omeprazole (PRILOSEC) 20 MG capsule Take 20 mg by mouth daily.   Yes [provider]  polyethylene glycol (MIRALAX / GLYCOLAX) 17 g packet Take 17 g by mouth daily as needed for mild constipation. 05/02/19  Yes Regalado, Belkys A, MD  tiotropium (SPIRIVA HANDIHALER) 18 MCG inhalation capsule Place 1 capsule (18 mcg total) into inhaler and inhale 2 (two) times daily. 05/02/19  Yes Regalado, Belkys A, MD  zinc sulfate 220 (50 Zn) MG capsule Take 1 capsule (220 mg total) by mouth daily. 05/02/19  Yes Regalado, Belkys A, MD     Vital Signs: BP (!) 109/57 (BP Location: Right Arm)   Pulse 93   Temp 98.3 F (36.8 C) (Axillary)   Resp 20   Ht 5' 5"  (1.651 m)   Wt 235 lb (  106.6 kg)   SpO2 100%   BMI 39.11 kg/m   Physical Exam Vitals and nursing note reviewed.  Constitutional:      General: She is not in acute distress.    Appearance: She is obese.  HENT:     Head: Normocephalic.  Neck:     Comments: (+) Right IJ puncture site unremarkable, no bleeding noted Cardiovascular:     Rate and Rhythm: Normal rate.     Pulses: Normal pulses.  Pulmonary:     Effort: Pulmonary effort is normal.  Abdominal:     Palpations: Abdomen is soft.     Tenderness: There is no abdominal tenderness.    Musculoskeletal:     Right lower leg: Edema (2+ pitting pedal to thigh) present.     Left lower leg: Edema (2+ pitting pedal to thigh) present.     Comments: Bilateral popliteal sites tender to palpation without bleeding or significant bruising noted. Coban in place.  Bilateral calves are tender to palpation. No tenderness noted to bilateral thighs, ankles or feet.   Skin:    General: Skin is warm and dry.     Findings: No bruising or rash.  Neurological:     Mental Status: She is alert. Mental status is at baseline.     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 She presents today for mechanical thrombectomy. EXAM: ULTRASOUND-GUIDED ACCESS RIGHT INTERNAL JUGULAR VEIN ULTRASOUND GUIDED ACCESS LEFT POPLITEAL VEIN ULTRASOUND GUIDED ACCESS RIGHT POPLITEAL VEIN MECHANICAL THROMBECTOMY/ASPIRATION THROMBECTOMY OF ACUTE THROMBUS OF IVC, BILATERAL ILIAC VEINS, BILATERAL FEMORAL VEINS COMPARISON:  CT IMAGING 05/12/2019 MEDICATIONS: 22000 units IV heparin ANESTHESIA/SEDATION: Versed 6.0 mg IV; Fentanyl 200 mcg IV Moderate Sedation Time:  211 The patient was continuously monitored during the procedure by the interventional radiology nurse under my direct supervision. FLUOROSCOPY TIME:  Fluoroscopy Time: 20 minutes 6 seconds (321 mGy). COMPLICATIONS: None TECHNIQUE: Operators: Dr. Ronny Bacon Dr. Corrie Mckusick Informed written consent was obtained from the patient after a thorough discussion of the procedural risks, benefits and alternatives. All questions were addressed. Maximal Sterile Barrier Technique was utilized including caps, mask, sterile gowns, sterile gloves, sterile drape, hand hygiene and skin antiseptic. A timeout was performed prior to the initiation of the procedure. Patient is brought to the IR suite and placed on the  table supine position. Her identity was confirmed. The right neck was prepped and draped in the usual sterile fashion. 1% lidocaine was used for local anesthesia. Ultrasound survey of the internal jugular vein was performed with images stored sent to PACs confirming patency. A micropuncture needle was used access the right internal jugular vein artery under ultrasound. With excellent venous blood flow returned, and an .018 micro wire was passed through the needle, observed to enter the superior vena cava. The needle was removed, and a micropuncture sheath was placed over the wire. The inner dilator and wire were removed, and an 035 Bentson wire was advanced under fluoroscopy into the IVC. With the wire in the IVC, a stab incision was made it the skin. Serial dilation 214 [place] was a treat. A 14 French 45 cm cook sheath was then placed over the wire into the IVC. The wire was removed, the sheath was flushed, and the sheath was secured at the IJ puncture site with sterile dressing. The patient was then flipped into a  prone position on the IR table. The bilateral popliteal region were prepped and draped in the usual sterile fashion. Ultrasound survey of the left popliteal region was performed with images stored and sent to PACs, confirming the target of the posterior tibial vein. Flow confirmed within the vein. A micropuncture needle was used access the left posterior tibial vein. With venous blood flow returned, and an .018 micro wire was passed through the needle, observed enter the femoral vein under fluoroscopy. The needle was removed, and a micropuncture sheath was placed over the wire. The inner dilator and wire were removed, and an stiff Glidewire was advanced under fluoroscopy into the femoral vein. The sheath was removed and a standard 6 French sheath was placed. The dilator was removed and the sheath was flushed. Glidewire was then advanced using a angled crossing catheter into the iliac vein. Ultrasound  survey of the right popliteal region was performed with images stored and sent to PACs, confirming the target of the popliteal vein. Flow confirmed within the vein. A micropuncture needle was used access the right popliteal vein. With venous blood flow returned, and an .018 micro wire was passed through the needle, observed enter the right femoral vein under fluoroscopy. The needle was removed, and a micropuncture sheath was placed over the wire. The inner dilator and wire were removed, and an stiff Glidewire was advanced under fluoroscopy into the femoral vein. The sheath was removed and a standard 6 French sheath was placed. The dilator was removed and the sheath was flushed. Glidewire was then advanced using a angled crossing catheter into the iliac vein. Crossing catheter and the Glidewire were used to navigate into the most inferior aspect of the 14 French IJ sheath. The wire was then externalized, and the 135 cm crossing catheter was externalized through the check flow valve. A stiff exchange length Amplatz wire was then placed through the crossing catheter as a flossing wire. Crossing catheter was removed. The sheath was then advanced over the wire below the tines of the IVC filter, and then a rendezvous was performed with the Glidewire from the right popliteal access using a angled catheter. With the crossing catheter within the sheath, the glide wires removed and 8 exchange length stiff Amplatz wire was placed into the 14 French sheath. Serial dilation of the bilateral popliteal access then performed up to 16 [place]. 50 [place] proprietary Inari sheats were then placed in the bilateral popliteal access. We then initiated mechanical thrombectomy using the clot triever device from the right popliteal region. Fluoroscopic observation was performed, confirming that the device was initiated each time below the tines of the IVC filter. 4-5 passes of the device were performed before a angiogram was performed  of the iliac and femoral vein through a coaxial angled catheter on the right. With resolution of the majority of thrombus on the right, we turned our attention to the left. Mechanical thrombectomy was performed on the left using the Clot Treever device. After 4-5 passes on the left side, a gentle angiogram was performed to the level of the iliac confluence. Once the clot within the bilateral iliac and femoral veins was resolved, we initiated therapy for the residual thrombus in the apex of the IVC. 38 French dilation was performed of the right popliteal vein access. The clot tree vert device was then removed and the 20 French flow tree vert device was passed over the stiff wire into the apex of the filter. The wire was withdrawn and flow tree vert device  was used for aspirating thrombus in the apex of the filter. We then passed angled catheters into the bilateral iliac veins through the access for a final angiogram confirming restoration of flow through the bilateral femoral veins, iliac veins, and the IVC and IVC filter. We attempted a balloon angioplasty for mechanical disruption of residual thrombus in the lower femoral vein on the right. Sequential 8 mm in 12 m renal balloon angioplasty performed, with no waist identified and without resolution of the thrombus. We elected withdraw at this point. Pursestring sutures placed at the bilateral popliteal vein access with pressure dressings applied and manual pressure used for hemostasis. The patient was flipped into supine position and the right IJ sheath was removed with manual pressure for hemostasis. The patient tolerated the procedure well and remained hemodynamically stable throughout. No complications were encountered and no significant blood loss. FINDINGS: Initial ultrasound images demonstrate patency of the right internal jugular vein, persistent thrombus of the bilateral popliteal veins. Initial angiogram of the bilateral popliteal access confirm ileal  femoral and ilio caval thrombosis from the filter to the popliteal veins bilaterally. After mechanical and aspiration thrombectomy there is restoration of flow through the entire bilateral iliofemoral and ilio caval segments through the filter. IMPRESSION: Status post ultrasound-guided access of right internal jugular vein and bilateral popliteal vein for mechanical/aspiration thrombectomy of extensive iliocaval and iliofemoral DVT, with restoration of flow from the bilateral popliteal veins through the IVC filter via Inari device. Signed, Dulcy Fanny. Dellia Nims, RPVI [facility] Radiology Electronically Signed   By: Corrie Mckusick D.O.   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 She presents today for mechanical thrombectomy. EXAM: ULTRASOUND-GUIDED ACCESS RIGHT INTERNAL JUGULAR VEIN ULTRASOUND GUIDED ACCESS LEFT POPLITEAL VEIN ULTRASOUND GUIDED ACCESS RIGHT POPLITEAL VEIN MECHANICAL THROMBECTOMY/ASPIRATION THROMBECTOMY OF ACUTE THROMBUS OF IVC, BILATERAL ILIAC VEINS, BILATERAL FEMORAL VEINS COMPARISON:  CT IMAGING 05/12/2019 MEDICATIONS: 22000 units IV heparin ANESTHESIA/SEDATION: Versed 6.0 mg IV; Fentanyl 200 mcg IV Moderate Sedation Time:  211 The patient was continuously monitored during the procedure by the interventional radiology nurse under my direct supervision. FLUOROSCOPY TIME:  Fluoroscopy Time: 20 minutes 6 seconds (321 mGy). COMPLICATIONS: None TECHNIQUE: Operators: Dr. Ronny Bacon Dr. Corrie Mckusick Informed written consent was obtained from the patient after a thorough discussion of the procedural risks, benefits and alternatives. All questions were addressed. Maximal Sterile Barrier Technique was utilized including caps, mask, sterile gowns, sterile gloves,  sterile drape, hand hygiene and skin antiseptic. A timeout was performed prior to the initiation of the procedure. Patient is brought to the IR suite and placed on the table supine position. Her identity was confirmed. The right neck was prepped and draped in the usual sterile fashion. 1% lidocaine was used for local anesthesia. Ultrasound survey of the internal jugular vein was performed with images stored sent to PACs confirming patency. A micropuncture needle was used access the right internal jugular vein artery under ultrasound. With excellent venous blood flow returned, and an .018 micro wire was passed through the needle, observed to enter the superior vena cava. The needle was removed, and a micropuncture sheath was placed over the wire. The inner dilator and wire were removed, and an 035 Bentson wire was advanced under fluoroscopy into the IVC. With the wire in the IVC, a  stab incision was made it the skin. Serial dilation 214 [place] was a treat. A 14 French 45 cm cook sheath was then placed over the wire into the IVC. The wire was removed, the sheath was flushed, and the sheath was secured at the IJ puncture site with sterile dressing. The patient was then flipped into a prone position on the IR table. The bilateral popliteal region were prepped and draped in the usual sterile fashion. Ultrasound survey of the left popliteal region was performed with images stored and sent to PACs, confirming the target of the posterior tibial vein. Flow confirmed within the vein. A micropuncture needle was used access the left posterior tibial vein. With venous blood flow returned, and an .018 micro wire was passed through the needle, observed enter the femoral vein under fluoroscopy. The needle was removed, and a micropuncture sheath was placed over the wire. The inner dilator and wire were removed, and an stiff Glidewire was advanced under fluoroscopy into the femoral vein. The sheath was removed and a standard 6 French  sheath was placed. The dilator was removed and the sheath was flushed. Glidewire was then advanced using a angled crossing catheter into the iliac vein. Ultrasound survey of the right popliteal region was performed with images stored and sent to PACs, confirming the target of the popliteal vein. Flow confirmed within the vein. A micropuncture needle was used access the right popliteal vein. With venous blood flow returned, and an .018 micro wire was passed through the needle, observed enter the right femoral vein under fluoroscopy. The needle was removed, and a micropuncture sheath was placed over the wire. The inner dilator and wire were removed, and an stiff Glidewire was advanced under fluoroscopy into the femoral vein. The sheath was removed and a standard 6 French sheath was placed. The dilator was removed and the sheath was flushed. Glidewire was then advanced using a angled crossing catheter into the iliac vein. Crossing catheter and the Glidewire were used to navigate into the most inferior aspect of the 14 French IJ sheath. The wire was then externalized, and the 135 cm crossing catheter was externalized through the check flow valve. A stiff exchange length Amplatz wire was then placed through the crossing catheter as a flossing wire. Crossing catheter was removed. The sheath was then advanced over the wire below the tines of the IVC filter, and then a rendezvous was performed with the Glidewire from the right popliteal access using a angled catheter. With the crossing catheter within the sheath, the glide wires removed and 8 exchange length stiff Amplatz wire was placed into the 14 French sheath. Serial dilation of the bilateral popliteal access then performed up to 16 [place]. 14 [place] proprietary Inari sheats were then placed in the bilateral popliteal access. We then initiated mechanical thrombectomy using the clot triever device from the right popliteal region. Fluoroscopic observation was  performed, confirming that the device was initiated each time below the tines of the IVC filter. 4-5 passes of the device were performed before a angiogram was performed of the iliac and femoral vein through a coaxial angled catheter on the right. With resolution of the majority of thrombus on the right, we turned our attention to the left. Mechanical thrombectomy was performed on the left using the Clot Treever device. After 4-5 passes on the left side, a gentle angiogram was performed to the level of the iliac confluence. Once the clot within the bilateral iliac and femoral veins was resolved, we initiated therapy  for the residual thrombus in the apex of the IVC. 1 French dilation was performed of the right popliteal vein access. The clot tree vert device was then removed and the 20 French flow tree vert device was passed over the stiff wire into the apex of the filter. The wire was withdrawn and flow tree vert device was used for aspirating thrombus in the apex of the filter. We then passed angled catheters into the bilateral iliac veins through the access for a final angiogram confirming restoration of flow through the bilateral femoral veins, iliac veins, and the IVC and IVC filter. We attempted a balloon angioplasty for mechanical disruption of residual thrombus in the lower femoral vein on the right. Sequential 8 mm in 12 m renal balloon angioplasty performed, with no waist identified and without resolution of the thrombus. We elected withdraw at this point. Pursestring sutures placed at the bilateral popliteal vein access with pressure dressings applied and manual pressure used for hemostasis. The patient was flipped into supine position and the right IJ sheath was removed with manual pressure for hemostasis. The patient tolerated the procedure well and remained hemodynamically stable throughout. No complications were encountered and no significant blood loss. FINDINGS: Initial ultrasound images  demonstrate patency of the right internal jugular vein, persistent thrombus of the bilateral popliteal veins. 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 After mechanical and aspiration thrombectomy there is restoration of flow through the entire bilateral iliofemoral and ilio caval segments through the filter. IMPRESSION: Status post ultrasound-guided access of right internal jugular vein and bilateral popliteal vein for mechanical/aspiration thrombectomy of extensive iliocaval and iliofemoral DVT, with restoration of flow from the bilateral popliteal veins through the IVC filter via Inari device. Signed, Dulcy Fanny. Dellia Nims, RPVI [facility] Radiology Electronically Signed   By: Corrie Mckusick D.O.   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 She presents today for mechanical thrombectomy. EXAM: ULTRASOUND-GUIDED ACCESS RIGHT INTERNAL JUGULAR VEIN ULTRASOUND GUIDED ACCESS LEFT POPLITEAL VEIN ULTRASOUND GUIDED ACCESS RIGHT POPLITEAL VEIN MECHANICAL THROMBECTOMY/ASPIRATION THROMBECTOMY OF ACUTE THROMBUS OF IVC, BILATERAL ILIAC VEINS, BILATERAL FEMORAL VEINS COMPARISON:  CT IMAGING 05/12/2019 MEDICATIONS: 22000 units IV heparin ANESTHESIA/SEDATION: Versed 6.0 mg IV; Fentanyl 200 mcg IV Moderate Sedation Time:  211 The patient was continuously monitored during the procedure by the interventional radiology nurse under my direct supervision. FLUOROSCOPY TIME:  Fluoroscopy Time: 20 minutes 6 seconds (321 mGy). COMPLICATIONS: None TECHNIQUE: Operators: Dr. Ronny Bacon Dr. Corrie Mckusick Informed written consent was obtained from the patient after a thorough  discussion of the procedural risks, benefits and alternatives. All questions were addressed. Maximal Sterile Barrier Technique was utilized including caps, mask, sterile gowns, sterile gloves, sterile drape, hand hygiene and skin antiseptic. A timeout was performed prior to the initiation of the procedure. Patient is brought to the IR suite and placed on the table supine position. Her identity was confirmed. The right neck was prepped and draped in the usual sterile fashion. 1% lidocaine was used for local anesthesia. Ultrasound survey of the internal jugular vein was performed with images stored sent to PACs confirming patency. A micropuncture needle was used access the right internal jugular vein artery under ultrasound. With  excellent venous blood flow returned, and an .018 micro wire was passed through the needle, observed to enter the superior vena cava. The needle was removed, and a micropuncture sheath was placed over the wire. The inner dilator and wire were removed, and an 035 Bentson wire was advanced under fluoroscopy into the IVC. With the wire in the IVC, a stab incision was made it the skin. Serial dilation 214 [place] was a treat. A 14 French 45 cm cook sheath was then placed over the wire into the IVC. The wire was removed, the sheath was flushed, and the sheath was secured at the IJ puncture site with sterile dressing. The patient was then flipped into a prone position on the IR table. The bilateral popliteal region were prepped and draped in the usual sterile fashion. Ultrasound survey of the left popliteal region was performed with images stored and sent to PACs, confirming the target of the posterior tibial vein. Flow confirmed within the vein. A micropuncture needle was used access the left posterior tibial vein. With venous blood flow returned, and an .018 micro wire was passed through the needle, observed enter the femoral vein under fluoroscopy. The needle was removed, and a micropuncture  sheath was placed over the wire. The inner dilator and wire were removed, and an stiff Glidewire was advanced under fluoroscopy into the femoral vein. The sheath was removed and a standard 6 French sheath was placed. The dilator was removed and the sheath was flushed. Glidewire was then advanced using a angled crossing catheter into the iliac vein. Ultrasound survey of the right popliteal region was performed with images stored and sent to PACs, confirming the target of the popliteal vein. Flow confirmed within the vein. A micropuncture needle was used access the right popliteal vein. With venous blood flow returned, and an .018 micro wire was passed through the needle, observed enter the right femoral vein under fluoroscopy. The needle was removed, and a micropuncture sheath was placed over the wire. The inner dilator and wire were removed, and an stiff Glidewire was advanced under fluoroscopy into the femoral vein. The sheath was removed and a standard 6 French sheath was placed. The dilator was removed and the sheath was flushed. Glidewire was then advanced using a angled crossing catheter into the iliac vein. Crossing catheter and the Glidewire were used to navigate into the most inferior aspect of the 14 French IJ sheath. The wire was then externalized, and the 135 cm crossing catheter was externalized through the check flow valve. A stiff exchange length Amplatz wire was then placed through the crossing catheter as a flossing wire. Crossing catheter was removed. The sheath was then advanced over the wire below the tines of the IVC filter, and then a rendezvous was performed with the Glidewire from the right popliteal access using a angled catheter. With the crossing catheter within the sheath, the glide wires removed and 8 exchange length stiff Amplatz wire was placed into the 14 French sheath. Serial dilation of the bilateral popliteal access then performed up to 16 [place]. 64 [place] proprietary Inari  sheats were then placed in the bilateral popliteal access. We then initiated mechanical thrombectomy using the clot triever device from the right popliteal region. Fluoroscopic observation was performed, confirming that the device was initiated each time below the tines of the IVC filter. 4-5 passes of the device were performed before a angiogram was performed of the iliac and femoral vein through a coaxial angled catheter on the right. With resolution  of the majority of thrombus on the right, we turned our attention to the left. Mechanical thrombectomy was performed on the left using the Clot Treever device. After 4-5 passes on the left side, a gentle angiogram was performed to the level of the iliac confluence. Once the clot within the bilateral iliac and femoral veins was resolved, we initiated therapy for the residual thrombus in the apex of the IVC. 26 French dilation was performed of the right popliteal vein access. The clot tree vert device was then removed and the 20 French flow tree vert device was passed over the stiff wire into the apex of the filter. The wire was withdrawn and flow tree vert device was used for aspirating thrombus in the apex of the filter. We then passed angled catheters into the bilateral iliac veins through the access for a final angiogram confirming restoration of flow through the bilateral femoral veins, iliac veins, and the IVC and IVC filter. We attempted a balloon angioplasty for mechanical disruption of residual thrombus in the lower femoral vein on the right. Sequential 8 mm in 12 m renal balloon angioplasty performed, with no waist identified and without resolution of the thrombus. We elected withdraw at this point. Pursestring sutures placed at the bilateral popliteal vein access with pressure dressings applied and manual pressure used for hemostasis. The patient was flipped into supine position and the right IJ sheath was removed with manual pressure for hemostasis. The  patient tolerated the procedure well and remained hemodynamically stable throughout. No complications were encountered and no significant blood loss. FINDINGS: Initial ultrasound images demonstrate patency of the right internal jugular vein, persistent thrombus of the bilateral popliteal veins. 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 After mechanical and aspiration thrombectomy there is restoration of flow through the entire bilateral iliofemoral and ilio caval segments through the filter. IMPRESSION: Status post ultrasound-guided access of right internal jugular vein and bilateral popliteal vein for mechanical/aspiration thrombectomy of extensive iliocaval and iliofemoral DVT, with restoration of flow from the bilateral popliteal veins through the IVC filter via Inari device. Signed, Dulcy Fanny. Dellia Nims, RPVI [facility] Radiology Electronically Signed   By: Corrie Mckusick D.O.   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 She presents today for mechanical thrombectomy. EXAM: ULTRASOUND-GUIDED ACCESS RIGHT INTERNAL JUGULAR VEIN ULTRASOUND GUIDED ACCESS LEFT POPLITEAL VEIN ULTRASOUND GUIDED ACCESS RIGHT POPLITEAL VEIN MECHANICAL THROMBECTOMY/ASPIRATION THROMBECTOMY OF ACUTE THROMBUS OF IVC, BILATERAL ILIAC VEINS, BILATERAL FEMORAL VEINS COMPARISON:  CT IMAGING 05/12/2019 MEDICATIONS: 22000 units IV heparin ANESTHESIA/SEDATION: Versed 6.0 mg IV; Fentanyl 200 mcg IV Moderate Sedation Time:  211 The patient was continuously monitored during the procedure by the interventional radiology nurse under my direct supervision. FLUOROSCOPY TIME:  Fluoroscopy  Time: 20 minutes 6 seconds (321 mGy). COMPLICATIONS: None TECHNIQUE: Operators: Dr. Ronny Bacon Dr. Corrie Mckusick Informed written consent was obtained from the patient after a thorough discussion of the procedural risks, benefits and alternatives. All questions were addressed. Maximal Sterile Barrier Technique was utilized including caps, mask, sterile gowns, sterile gloves, sterile drape, hand hygiene and skin antiseptic. A timeout was performed prior to the initiation of the procedure. Patient is brought to the IR suite and  placed on the table supine position. Her identity was confirmed. The right neck was prepped and draped in the usual sterile fashion. 1% lidocaine was used for local anesthesia. Ultrasound survey of the internal jugular vein was performed with images stored sent to PACs confirming patency. A micropuncture needle was used access the right internal jugular vein artery under ultrasound. With excellent venous blood flow returned, and an .018 micro wire was passed through the needle, observed to enter the superior vena cava. The needle was removed, and a micropuncture sheath was placed over the wire. The inner dilator and wire were removed, and an 035 Bentson wire was advanced under fluoroscopy into the IVC. With the wire in the IVC, a stab incision was made it the skin. Serial dilation 214 [place] was a treat. A 14 French 45 cm cook sheath was then placed over the wire into the IVC. The wire was removed, the sheath was flushed, and the sheath was secured at the IJ puncture site with sterile dressing. The patient was then flipped into a prone position on the IR table. The bilateral popliteal region were prepped and draped in the usual sterile fashion. Ultrasound survey of the left popliteal region was performed with images stored and sent to PACs, confirming the target of the posterior tibial vein. Flow confirmed within the vein. A micropuncture needle was used access the left posterior tibial vein.  With venous blood flow returned, and an .018 micro wire was passed through the needle, observed enter the femoral vein under fluoroscopy. The needle was removed, and a micropuncture sheath was placed over the wire. The inner dilator and wire were removed, and an stiff Glidewire was advanced under fluoroscopy into the femoral vein. The sheath was removed and a standard 6 French sheath was placed. The dilator was removed and the sheath was flushed. Glidewire was then advanced using a angled crossing catheter into the iliac vein. Ultrasound survey of the right popliteal region was performed with images stored and sent to PACs, confirming the target of the popliteal vein. Flow confirmed within the vein. A micropuncture needle was used access the right popliteal vein. With venous blood flow returned, and an .018 micro wire was passed through the needle, observed enter the right femoral vein under fluoroscopy. The needle was removed, and a micropuncture sheath was placed over the wire. The inner dilator and wire were removed, and an stiff Glidewire was advanced under fluoroscopy into the femoral vein. The sheath was removed and a standard 6 French sheath was placed. The dilator was removed and the sheath was flushed. Glidewire was then advanced using a angled crossing catheter into the iliac vein. Crossing catheter and the Glidewire were used to navigate into the most inferior aspect of the 14 French IJ sheath. The wire was then externalized, and the 135 cm crossing catheter was externalized through the check flow valve. A stiff exchange length Amplatz wire was then placed through the crossing catheter as a flossing wire. Crossing catheter was removed. The sheath was then advanced over the wire below the tines of the IVC filter, and then a rendezvous was performed with the Glidewire from the right popliteal access using a angled catheter. With the crossing catheter within the sheath, the glide wires removed and 8 exchange  length stiff Amplatz wire was placed into the 14 French sheath. Serial dilation of the bilateral popliteal access then performed up to 16 [place]. 44 [place] proprietary Inari sheats were then placed in the bilateral popliteal access. We  then initiated mechanical thrombectomy using the clot triever device from the right popliteal region. Fluoroscopic observation was performed, confirming that the device was initiated each time below the tines of the IVC filter. 4-5 passes of the device were performed before a angiogram was performed of the iliac and femoral vein through a coaxial angled catheter on the right. With resolution of the majority of thrombus on the right, we turned our attention to the left. Mechanical thrombectomy was performed on the left using the Clot Treever device. After 4-5 passes on the left side, a gentle angiogram was performed to the level of the iliac confluence. Once the clot within the bilateral iliac and femoral veins was resolved, we initiated therapy for the residual thrombus in the apex of the IVC. 12 French dilation was performed of the right popliteal vein access. The clot tree vert device was then removed and the 20 French flow tree vert device was passed over the stiff wire into the apex of the filter. The wire was withdrawn and flow tree vert device was used for aspirating thrombus in the apex of the filter. We then passed angled catheters into the bilateral iliac veins through the access for a final angiogram confirming restoration of flow through the bilateral femoral veins, iliac veins, and the IVC and IVC filter. We attempted a balloon angioplasty for mechanical disruption of residual thrombus in the lower femoral vein on the right. Sequential 8 mm in 12 m renal balloon angioplasty performed, with no waist identified and without resolution of the thrombus. We elected withdraw at this point. Pursestring sutures placed at the bilateral popliteal vein access with pressure  dressings applied and manual pressure used for hemostasis. The patient was flipped into supine position and the right IJ sheath was removed with manual pressure for hemostasis. The patient tolerated the procedure well and remained hemodynamically stable throughout. No complications were encountered and no significant blood loss. FINDINGS: Initial ultrasound images demonstrate patency of the right internal jugular vein, persistent thrombus of the bilateral popliteal veins. 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 After mechanical and aspiration thrombectomy there is restoration of flow through the entire bilateral iliofemoral and ilio caval segments through the filter. IMPRESSION: Status post ultrasound-guided access of right internal jugular vein and bilateral popliteal vein for mechanical/aspiration thrombectomy of extensive iliocaval and iliofemoral DVT, with restoration of flow from the bilateral popliteal veins through the IVC filter via Inari device. Signed, Dulcy Fanny. Dellia Nims, RPVI Vascular and Interventional Radiology Specialists [facility] Radiology Electronically Signed   By: Corrie Mckusick D.O.   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 She presents today for mechanical thrombectomy. EXAM: ULTRASOUND-GUIDED ACCESS RIGHT INTERNAL JUGULAR VEIN ULTRASOUND GUIDED ACCESS LEFT POPLITEAL VEIN ULTRASOUND GUIDED ACCESS RIGHT POPLITEAL VEIN MECHANICAL THROMBECTOMY/ASPIRATION THROMBECTOMY OF ACUTE THROMBUS OF IVC, BILATERAL ILIAC VEINS, BILATERAL FEMORAL VEINS COMPARISON:  CT IMAGING 05/12/2019 MEDICATIONS: 22000 units IV heparin ANESTHESIA/SEDATION: Versed 6.0 mg IV; Fentanyl 200 mcg IV Moderate  Sedation Time:  211 The patient was continuously monitored during the procedure by the interventional radiology nurse under my direct supervision. FLUOROSCOPY TIME:  Fluoroscopy Time: 20 minutes 6 seconds (321 mGy). COMPLICATIONS: None TECHNIQUE: Operators: Dr. Ronny Bacon Dr. Corrie Mckusick Informed  written consent was obtained from the patient after a thorough discussion of the procedural risks, benefits and alternatives. All questions were addressed. Maximal Sterile Barrier Technique was utilized including caps, mask, sterile gowns, sterile gloves, sterile drape, hand hygiene and skin antiseptic. A timeout was performed prior to the initiation of the procedure. Patient is brought to the IR suite and placed on the table supine position. Her identity was confirmed. The right neck was prepped and draped in the usual sterile fashion. 1% lidocaine was used for local anesthesia. Ultrasound survey of the internal jugular vein was performed with images stored sent to PACs confirming patency. A micropuncture needle was used access the right internal jugular vein artery under ultrasound. With excellent venous blood flow returned, and an .018 micro wire was passed through the needle, observed to enter the superior vena cava. The needle was removed, and a micropuncture sheath was placed over the wire. The inner dilator and wire were removed, and an 035 Bentson wire was advanced under fluoroscopy into the IVC. With the wire in the IVC, a stab incision was made it the skin. Serial dilation 214 [place] was a treat. A 14 French 45 cm cook sheath was then placed over the wire into the IVC. The wire was removed, the sheath was flushed, and the sheath was secured at the IJ puncture site with sterile dressing. The patient was then flipped into a prone position on the IR table. The bilateral popliteal region were prepped and draped in the usual sterile fashion. Ultrasound survey of the left popliteal region was performed with images  stored and sent to PACs, confirming the target of the posterior tibial vein. Flow confirmed within the vein. A micropuncture needle was used access the left posterior tibial vein. With venous blood flow returned, and an .018 micro wire was passed through the needle, observed enter the femoral vein under fluoroscopy. The needle was removed, and a micropuncture sheath was placed over the wire. The inner dilator and wire were removed, and an stiff Glidewire was advanced under fluoroscopy into the femoral vein. The sheath was removed and a standard 6 French sheath was placed. The dilator was removed and the sheath was flushed. Glidewire was then advanced using a angled crossing catheter into the iliac vein. Ultrasound survey of the right popliteal region was performed with images stored and sent to PACs, confirming the target of the popliteal vein. Flow confirmed within the vein. A micropuncture needle was used access the right popliteal vein. With venous blood flow returned, and an .018 micro wire was passed through the needle, observed enter the right femoral vein under fluoroscopy. The needle was removed, and a micropuncture sheath was placed over the wire. The inner dilator and wire were removed, and an stiff Glidewire was advanced under fluoroscopy into the femoral vein. The sheath was removed and a standard 6 French sheath was placed. The dilator was removed and the sheath was flushed. Glidewire was then advanced using a angled crossing catheter into the iliac vein. Crossing catheter and the Glidewire were used to navigate into the most inferior aspect of the 14 French IJ sheath. The wire was then externalized, and the 135 cm crossing catheter was externalized through the check flow valve. A stiff exchange length Amplatz wire was then placed through the crossing catheter as a flossing wire. Crossing catheter was removed. The sheath was then advanced over the wire below the tines of the IVC filter, and then a  rendezvous was performed with the Glidewire  from the right popliteal access using a angled catheter. With the crossing catheter within the sheath, the glide wires removed and 8 exchange length stiff Amplatz wire was placed into the 14 French sheath. Serial dilation of the bilateral popliteal access then performed up to 16 [place]. 89 [place] proprietary Inari sheats were then placed in the bilateral popliteal access. We then initiated mechanical thrombectomy using the clot triever device from the right popliteal region. Fluoroscopic observation was performed, confirming that the device was initiated each time below the tines of the IVC filter. 4-5 passes of the device were performed before a angiogram was performed of the iliac and femoral vein through a coaxial angled catheter on the right. With resolution of the majority of thrombus on the right, we turned our attention to the left. Mechanical thrombectomy was performed on the left using the Clot Treever device. After 4-5 passes on the left side, a gentle angiogram was performed to the level of the iliac confluence. Once the clot within the bilateral iliac and femoral veins was resolved, we initiated therapy for the residual thrombus in the apex of the IVC. 33 French dilation was performed of the right popliteal vein access. The clot tree vert device was then removed and the 20 French flow tree vert device was passed over the stiff wire into the apex of the filter. The wire was withdrawn and flow tree vert device was used for aspirating thrombus in the apex of the filter. We then passed angled catheters into the bilateral iliac veins through the access for a final angiogram confirming restoration of flow through the bilateral femoral veins, iliac veins, and the IVC and IVC filter. We attempted a balloon angioplasty for mechanical disruption of residual thrombus in the lower femoral vein on the right. Sequential 8 mm in 12 m renal balloon angioplasty  performed, with no waist identified and without resolution of the thrombus. We elected withdraw at this point. Pursestring sutures placed at the bilateral popliteal vein access with pressure dressings applied and manual pressure used for hemostasis. The patient was flipped into supine position and the right IJ sheath was removed with manual pressure for hemostasis. The patient tolerated the procedure well and remained hemodynamically stable throughout. No complications were encountered and no significant blood loss. FINDINGS: Initial ultrasound images demonstrate patency of the right internal jugular vein, persistent thrombus of the bilateral popliteal veins. 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 After mechanical and aspiration thrombectomy there is restoration of flow through the entire bilateral iliofemoral and ilio caval segments through the filter. IMPRESSION: Status post ultrasound-guided access of right internal jugular vein and bilateral popliteal vein for mechanical/aspiration thrombectomy of extensive iliocaval and iliofemoral DVT, with restoration of flow from the bilateral popliteal veins through the IVC filter via Inari device. Signed, Dulcy Fanny. Dellia Nims, RPVI Vascular and Interventional Radiology Specialists [facility] Radiology Electronically Signed   By: Corrie Mckusick D.O.   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 She presents today for mechanical thrombectomy. EXAM: ULTRASOUND-GUIDED ACCESS RIGHT INTERNAL JUGULAR VEIN ULTRASOUND GUIDED ACCESS LEFT POPLITEAL VEIN ULTRASOUND GUIDED ACCESS RIGHT POPLITEAL VEIN MECHANICAL THROMBECTOMY/ASPIRATION THROMBECTOMY OF ACUTE THROMBUS OF  IVC, BILATERAL ILIAC VEINS, BILATERAL FEMORAL VEINS COMPARISON:  CT IMAGING 05/12/2019 MEDICATIONS: 22000 units IV heparin ANESTHESIA/SEDATION: Versed 6.0 mg IV; Fentanyl 200 mcg IV Moderate Sedation Time:  211 The patient was continuously monitored during the procedure by the interventional radiology nurse under my direct supervision. FLUOROSCOPY TIME:  Fluoroscopy Time: 20 minutes 6 seconds (321 mGy). COMPLICATIONS: None TECHNIQUE: Operators: Dr. Ronny Bacon Dr. Corrie Mckusick Informed written consent was obtained from the patient after a thorough discussion of the procedural risks, benefits and alternatives. All questions were addressed. Maximal Sterile Barrier Technique was utilized including caps, mask, sterile gowns, sterile gloves, sterile drape, hand hygiene and skin antiseptic. A timeout was performed prior to the initiation of the procedure. Patient is brought to the IR suite and placed on the table supine position. Her identity was confirmed. The right neck was prepped and draped in the usual sterile fashion. 1% lidocaine was used for local anesthesia. Ultrasound survey of the internal jugular vein was performed with images stored sent to PACs confirming patency. A micropuncture needle was used access the right internal jugular vein artery under ultrasound. With excellent venous blood flow returned, and an .018 micro wire was passed through the needle, observed to enter the superior vena cava. The needle was removed, and a micropuncture sheath was placed over the wire. The inner dilator and wire were removed, and an 035 Bentson wire was advanced under fluoroscopy into the IVC. With the wire in the IVC, a stab incision was made it the skin. Serial dilation 214 [place] was a treat. A 14 French 45 cm cook sheath was then placed over the wire into the IVC. The wire was removed, the sheath was flushed, and the sheath was secured at the IJ puncture site with sterile dressing. The patient was then flipped into a  prone position on the IR table. The bilateral popliteal region were prepped and draped in the usual sterile fashion. Ultrasound survey of the left popliteal region was performed with images stored and sent to PACs, confirming the target of the posterior tibial vein. Flow confirmed within the vein. A micropuncture needle was used access the left posterior tibial vein. With venous blood flow returned, and an .018 micro wire was passed through the needle, observed enter the femoral vein under fluoroscopy. The needle was removed, and a micropuncture sheath was placed over the wire. The inner dilator and wire were removed, and an stiff Glidewire was advanced under fluoroscopy into the femoral vein. The sheath was removed and a standard 6 French sheath was placed. The dilator was removed and the sheath was flushed. Glidewire was then advanced using a angled crossing catheter into the iliac vein. Ultrasound survey of the right popliteal region was performed with images stored and sent to PACs, confirming the target of the popliteal vein. Flow confirmed within the vein. A micropuncture needle was used access the right popliteal vein. With venous blood flow returned, and an .018 micro wire was passed through the needle, observed enter the right femoral vein under fluoroscopy. The needle was removed, and a micropuncture sheath was placed over the wire. The inner dilator and wire were removed, and an stiff Glidewire was advanced under fluoroscopy into the femoral vein. The sheath was removed and a standard 6 French sheath was placed. The dilator was removed and the sheath was flushed. Glidewire was then advanced using a angled crossing catheter into the iliac vein. Crossing catheter and the Glidewire were used to navigate into the most inferior aspect of the 14 French IJ sheath. The wire was  then externalized, and the 135 cm crossing catheter was externalized through the check flow valve. A stiff exchange length Amplatz wire  was then placed through the crossing catheter as a flossing wire. Crossing catheter was removed. The sheath was then advanced over the wire below the tines of the IVC filter, and then a rendezvous was performed with the Glidewire from the right popliteal access using a angled catheter. With the crossing catheter within the sheath, the glide wires removed and 8 exchange length stiff Amplatz wire was placed into the 14 French sheath. Serial dilation of the bilateral popliteal access then performed up to 16 [place]. 46 [place] proprietary Inari sheats were then placed in the bilateral popliteal access. We then initiated mechanical thrombectomy using the clot triever device from the right popliteal region. Fluoroscopic observation was performed, confirming that the device was initiated each time below the tines of the IVC filter. 4-5 passes of the device were performed before a angiogram was performed of the iliac and femoral vein through a coaxial angled catheter on the right. With resolution of the majority of thrombus on the right, we turned our attention to the left. Mechanical thrombectomy was performed on the left using the Clot Treever device. After 4-5 passes on the left side, a gentle angiogram was performed to the level of the iliac confluence. Once the clot within the bilateral iliac and femoral veins was resolved, we initiated therapy for the residual thrombus in the apex of the IVC. 21 French dilation was performed of the right popliteal vein access. The clot tree vert device was then removed and the 20 French flow tree vert device was passed over the stiff wire into the apex of the filter. The wire was withdrawn and flow tree vert device was used for aspirating thrombus in the apex of the filter. We then passed angled catheters into the bilateral iliac veins through the access for a final angiogram confirming restoration of flow through the bilateral femoral veins, iliac veins, and the IVC and IVC  filter. We attempted a balloon angioplasty for mechanical disruption of residual thrombus in the lower femoral vein on the right. Sequential 8 mm in 12 m renal balloon angioplasty performed, with no waist identified and without resolution of the thrombus. We elected withdraw at this point. Pursestring sutures placed at the bilateral popliteal vein access with pressure dressings applied and manual pressure used for hemostasis. The patient was flipped into supine position and the right IJ sheath was removed with manual pressure for hemostasis. The patient tolerated the procedure well and remained hemodynamically stable throughout. No complications were encountered and no significant blood loss. FINDINGS: Initial ultrasound images demonstrate patency of the right internal jugular vein, persistent thrombus of the bilateral popliteal veins. 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 After mechanical and aspiration thrombectomy there is restoration of flow through the entire bilateral iliofemoral and ilio caval segments through the filter. IMPRESSION: Status post ultrasound-guided access of right internal jugular vein and bilateral popliteal vein for mechanical/aspiration thrombectomy of extensive iliocaval and iliofemoral DVT, with restoration of flow from the bilateral popliteal veins through the IVC filter via Inari device. Signed, Dulcy Fanny. Dellia Nims, RPVI [facility] Radiology Electronically Signed   By: Corrie Mckusick D.O.   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 She presents today for mechanical thrombectomy. EXAM: ULTRASOUND-GUIDED ACCESS RIGHT  INTERNAL JUGULAR VEIN ULTRASOUND GUIDED ACCESS LEFT POPLITEAL VEIN ULTRASOUND GUIDED ACCESS RIGHT POPLITEAL VEIN MECHANICAL THROMBECTOMY/ASPIRATION THROMBECTOMY OF ACUTE THROMBUS OF IVC, BILATERAL ILIAC VEINS, BILATERAL FEMORAL VEINS COMPARISON:  CT IMAGING 05/12/2019 MEDICATIONS: 22000 units IV heparin ANESTHESIA/SEDATION: Versed 6.0 mg IV; Fentanyl 200 mcg IV Moderate Sedation Time:  211 The patient was continuously monitored during the procedure by the interventional radiology nurse under my direct supervision. FLUOROSCOPY TIME:  Fluoroscopy Time: 20 minutes 6 seconds (321 mGy). COMPLICATIONS: None TECHNIQUE: Operators: Dr. Ronny Bacon Dr. Corrie Mckusick Informed written consent was obtained from the patient after a thorough discussion of the procedural risks, benefits and alternatives. All questions were addressed. Maximal Sterile Barrier Technique was utilized including caps, mask, sterile gowns, sterile gloves, sterile drape, hand hygiene and skin antiseptic. A timeout was performed prior to the initiation of the procedure. Patient is brought to the IR suite and placed on the table supine position. Her identity was confirmed. The right neck was prepped and draped in the usual sterile fashion. 1% lidocaine was used for local anesthesia. Ultrasound survey of the internal jugular vein was performed with images stored sent to PACs confirming patency. A micropuncture needle was used access the right internal jugular vein artery under ultrasound. With excellent venous blood flow returned, and an .018 micro wire was passed through the needle, observed to enter the superior vena cava. The needle was removed, and a micropuncture sheath was placed over the wire. The inner dilator and wire were removed, and an 035 Bentson wire was advanced under fluoroscopy into the IVC. With the wire in the IVC, a stab incision was made it the skin. Serial dilation 214 [place] was a treat. A 14 French 45 cm cook sheath was then placed over  the wire into the IVC. The wire was removed, the sheath was flushed, and the sheath was secured at the IJ puncture site with sterile dressing. The patient was then flipped into a prone position on the IR table. The bilateral popliteal region were prepped and draped in the usual sterile fashion. Ultrasound survey of the left popliteal region was performed with images stored and sent to PACs, confirming the target of the posterior tibial vein. Flow confirmed within the vein. A micropuncture needle was used access the left posterior tibial vein. With venous blood flow returned, and an .018 micro wire was passed through the needle, observed enter the femoral vein under fluoroscopy. The needle was removed, and a micropuncture sheath was placed over the wire. The inner dilator and wire were removed, and an stiff Glidewire was advanced under fluoroscopy into the femoral vein. The sheath was removed and a standard 6 French sheath was placed. The dilator was removed and the sheath was flushed. Glidewire was then advanced using a angled crossing catheter into the iliac vein. Ultrasound survey of the right popliteal region was performed with images stored and sent to PACs, confirming the target of the popliteal vein. Flow confirmed within the vein. A micropuncture needle was used access the right popliteal vein. With venous blood flow returned, and an .018 micro wire was passed through the needle, observed enter the right femoral vein under fluoroscopy. The needle was removed, and a micropuncture sheath was placed over the wire. The inner dilator and wire were removed, and an stiff Glidewire was advanced under fluoroscopy  into the femoral vein. The sheath was removed and a standard 6 French sheath was placed. The dilator was removed and the sheath was flushed. Glidewire was then advanced using a angled crossing catheter into the iliac vein. Crossing catheter and the Glidewire were used to navigate into the most inferior aspect  of the 14 French IJ sheath. The wire was then externalized, and the 135 cm crossing catheter was externalized through the check flow valve. A stiff exchange length Amplatz wire was then placed through the crossing catheter as a flossing wire. Crossing catheter was removed. The sheath was then advanced over the wire below the tines of the IVC filter, and then a rendezvous was performed with the Glidewire from the right popliteal access using a angled catheter. With the crossing catheter within the sheath, the glide wires removed and 8 exchange length stiff Amplatz wire was placed into the 14 French sheath. Serial dilation of the bilateral popliteal access then performed up to 16 [place]. 85 [place] proprietary Inari sheats were then placed in the bilateral popliteal access. We then initiated mechanical thrombectomy using the clot triever device from the right popliteal region. Fluoroscopic observation was performed, confirming that the device was initiated each time below the tines of the IVC filter. 4-5 passes of the device were performed before a angiogram was performed of the iliac and femoral vein through a coaxial angled catheter on the right. With resolution of the majority of thrombus on the right, we turned our attention to the left. Mechanical thrombectomy was performed on the left using the Clot Treever device. After 4-5 passes on the left side, a gentle angiogram was performed to the level of the iliac confluence. Once the clot within the bilateral iliac and femoral veins was resolved, we initiated therapy for the residual thrombus in the apex of the IVC. 35 French dilation was performed of the right popliteal vein access. The clot tree vert device was then removed and the 20 French flow tree vert device was passed over the stiff wire into the apex of the filter. The wire was withdrawn and flow tree vert device was used for aspirating thrombus in the apex of the filter. We then passed angled  catheters into the bilateral iliac veins through the access for a final angiogram confirming restoration of flow through the bilateral femoral veins, iliac veins, and the IVC and IVC filter. We attempted a balloon angioplasty for mechanical disruption of residual thrombus in the lower femoral vein on the right. Sequential 8 mm in 12 m renal balloon angioplasty performed, with no waist identified and without resolution of the thrombus. We elected withdraw at this point. Pursestring sutures placed at the bilateral popliteal vein access with pressure dressings applied and manual pressure used for hemostasis. The patient was flipped into supine position and the right IJ sheath was removed with manual pressure for hemostasis. The patient tolerated the procedure well and remained hemodynamically stable throughout. No complications were encountered and no significant blood loss. FINDINGS: Initial ultrasound images demonstrate patency of the right internal jugular vein, persistent thrombus of the bilateral popliteal veins. 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 After mechanical and aspiration thrombectomy there is restoration of flow through the entire bilateral iliofemoral and ilio caval segments through the filter. IMPRESSION: Status post ultrasound-guided access of right internal jugular vein and bilateral popliteal vein for mechanical/aspiration thrombectomy of extensive iliocaval and iliofemoral DVT, with restoration  of flow from the bilateral popliteal veins through the IVC filter via Inari device. Signed, Dulcy Fanny. Dellia Nims, RPVI [facility] Inter[facility] Radiology Specialists [facility] Radiology Electronically Signed   By: Corrie Mckusick D.O.   On: 05/16/2019 17:51   IR PTA VENOUS EXCEPT DIALYSIS CIRCUIT  Result Date: 05/16/2019 INDICATION: 69 year old female with a history of ilio caval and ileal  femoral thrombus from the IVC to the popliteal veins, with risk for future post thrombotic syndrome. She presents today for mechanical thrombectomy. EXAM: ULTRASOUND-GUIDED ACCESS RIGHT INTERNAL JUGULAR VEIN ULTRASOUND GUIDED ACCESS LEFT POPLITEAL VEIN ULTRASOUND GUIDED ACCESS RIGHT POPLITEAL VEIN MECHANICAL THROMBECTOMY/ASPIRATION THROMBECTOMY OF ACUTE THROMBUS OF IVC, BILATERAL ILIAC VEINS, BILATERAL FEMORAL VEINS COMPARISON:  CT IMAGING 05/12/2019 MEDICATIONS: 22000 units IV heparin ANESTHESIA/SEDATION: Versed 6.0 mg IV; Fentanyl 200 mcg IV Moderate Sedation Time:  211 The patient was continuously monitored during the procedure by the interventional radiology nurse under my direct supervision. FLUOROSCOPY TIME:  Fluoroscopy Time: 20 minutes 6 seconds (321 mGy). COMPLICATIONS: None TECHNIQUE: Operators: Dr. Ronny Bacon Dr. Corrie Mckusick Informed written consent was obtained from the patient after a thorough discussion of the procedural risks, benefits and alternatives. All questions were addressed. Maximal Sterile Barrier Technique was utilized including caps, mask, sterile gowns, sterile gloves, sterile drape, hand hygiene and skin antiseptic. A timeout was performed prior to the initiation of the procedure. Patient is brought to the IR suite and placed on the table supine position. Her identity was confirmed. The right neck was prepped and draped in the usual sterile fashion. 1% lidocaine was used for local anesthesia. Ultrasound survey of the internal jugular vein was performed with images stored sent to PACs confirming patency. A micropuncture needle was used access the right internal jugular vein artery under ultrasound. With excellent venous blood flow returned, and an .018 micro wire was passed through the needle, observed to enter the superior vena cava. The needle was removed, and a micropuncture sheath was placed over the wire. The inner dilator and wire were removed, and an 035 Bentson wire was advanced  under fluoroscopy into the IVC. With the wire in the IVC, a stab incision was made it the skin. Serial dilation 214 [place] was a treat. A 14 French 45 cm cook sheath was then placed over the wire into the IVC. The wire was removed, the sheath was flushed, and the sheath was secured at the IJ puncture site with sterile dressing. The patient was then flipped into a prone position on the IR table. The bilateral popliteal region were prepped and draped in the usual sterile fashion. Ultrasound survey of the left popliteal region was performed with images stored and sent to PACs, confirming the target of the posterior tibial vein. Flow confirmed within the vein. A micropuncture needle was used access the left posterior tibial vein. With venous blood flow returned, and an .018 micro wire was passed through the needle, observed enter the femoral vein under fluoroscopy. The needle was removed, and a micropuncture sheath was placed over the wire. The inner dilator and wire were removed, and an stiff Glidewire was advanced under fluoroscopy into the femoral vein. The sheath was removed and a standard 6 French sheath was placed. The dilator was removed and the sheath was flushed. Glidewire was then advanced using a angled crossing catheter into the iliac vein. Ultrasound survey of the right popliteal region was performed with images stored and sent to PACs, confirming the target of the popliteal vein. Flow confirmed within the vein.  A micropuncture needle was used access the right popliteal vein. With venous blood flow returned, and an .018 micro wire was passed through the needle, observed enter the right femoral vein under fluoroscopy. The needle was removed, and a micropuncture sheath was placed over the wire. The inner dilator and wire were removed, and an stiff Glidewire was advanced under fluoroscopy into the femoral vein. The sheath was removed and a standard 6 French sheath was placed. The dilator was removed and the  sheath was flushed. Glidewire was then advanced using a angled crossing catheter into the iliac vein. Crossing catheter and the Glidewire were used to navigate into the most inferior aspect of the 14 French IJ sheath. The wire was then externalized, and the 135 cm crossing catheter was externalized through the check flow valve. A stiff exchange length Amplatz wire was then placed through the crossing catheter as a flossing wire. Crossing catheter was removed. The sheath was then advanced over the wire below the tines of the IVC filter, and then a rendezvous was performed with the Glidewire from the right popliteal access using a angled catheter. With the crossing catheter within the sheath, the glide wires removed and 8 exchange length stiff Amplatz wire was placed into the 14 French sheath. Serial dilation of the bilateral popliteal access then performed up to 16 [place]. 64 [place] proprietary Inari sheats were then placed in the bilateral popliteal access. We then initiated mechanical thrombectomy using the clot triever device from the right popliteal region. Fluoroscopic observation was performed, confirming that the device was initiated each time below the tines of the IVC filter. 4-5 passes of the device were performed before a angiogram was performed of the iliac and femoral vein through a coaxial angled catheter on the right. With resolution of the majority of thrombus on the right, we turned our attention to the left. Mechanical thrombectomy was performed on the left using the Clot Treever device. After 4-5 passes on the left side, a gentle angiogram was performed to the level of the iliac confluence. Once the clot within the bilateral iliac and femoral veins was resolved, we initiated therapy for the residual thrombus in the apex of the IVC. 5 French dilation was performed of the right popliteal vein access. The clot tree vert device was then removed and the 20 French flow tree vert device was  passed over the stiff wire into the apex of the filter. The wire was withdrawn and flow tree vert device was used for aspirating thrombus in the apex of the filter. We then passed angled catheters into the bilateral iliac veins through the access for a final angiogram confirming restoration of flow through the bilateral femoral veins, iliac veins, and the IVC and IVC filter. We attempted a balloon angioplasty for mechanical disruption of residual thrombus in the lower femoral vein on the right. Sequential 8 mm in 12 m renal balloon angioplasty performed, with no waist identified and without resolution of the thrombus. We elected withdraw at this point. Pursestring sutures placed at the bilateral popliteal vein access with pressure dressings applied and manual pressure used for hemostasis. The patient was flipped into supine position and the right IJ sheath was removed with manual pressure for hemostasis. The patient tolerated the procedure well and remained hemodynamically stable throughout. No complications were encountered and no significant blood loss. FINDINGS: Initial ultrasound images demonstrate patency of the right internal jugular vein, persistent thrombus of the bilateral popliteal veins. Initial angiogram of the bilateral popliteal access confirm  ileal femoral and ilio caval thrombosis from the filter to the popliteal veins bilaterally. After mechanical and aspiration thrombectomy there is restoration of flow through the entire bilateral iliofemoral and ilio caval segments through the filter. IMPRESSION: Status post ultrasound-guided access of right internal jugular vein and bilateral popliteal vein for mechanical/aspiration thrombectomy of extensive iliocaval and iliofemoral DVT, with restoration of flow from the bilateral popliteal veins through the IVC filter via Inari device. Signed, Dulcy Fanny. Dellia Nims, RPVI [facility] Inter[facility] Radiology Special[facility] Radiology Electronically Signed    By: Corrie Mckusick D.O.   On: 05/16/2019 17:51    Labs:  CBC: Recent Labs    05/16/19 0230 05/16/19 2335 05/17/19 0221 05/17/19 0841  WBC 12.4* 12.9* 13.9* 12.1*  HGB 9.7* 7.8* 7.4* 8.3*  HCT 30.2* 24.9* 24.2* 26.8*  PLT 192 186 180 185    COAGS: Recent Labs    04/27/19 2018 05/14/19 1200 05/16/19 0230  INR 1.4* 1.2 1.0  APTT  --  32  --     BMP: Recent Labs    05/12/19 0609 05/14/19 0756 05/14/19 2350 05/16/19 0230  NA 129* 128* 128* 131*  K 4.4 4.8 4.8 4.3  CL 98 100 102 104  CO2 18* 17* 19* 19*  GLUCOSE 233* 227* 240* 177*  BUN 27* 29* 25* 20  CALCIUM 9.5 9.1 8.8* 8.7*  CREATININE 1.19* 0.91 0.90 0.85  GFRNONAA 47* >60 >60 >60  GFRAA 55* >60 >60 >60    LIVER FUNCTION TESTS: Recent Labs    05/10/19 2358 05/11/19 0316 05/12/19 0609 05/16/19 0230  BILITOT 2.3* 2.1* 2.2* 1.0  AST 22 23 17 24   ALT 16 13 13 16   ALKPHOS 86 77 76 87  PROT 8.5* 7.9 8.0 6.4*  ALBUMIN 4.2 3.9 3.7 2.6*    Assessment and Plan:  69 y/o F with extensive bilateral lower extremity DVT s/p mechanical/aspiration thrombectomy of IVC, bilateral iliac, bilateral femoral veins 05/16/19 with Dr. Earleen Newport and Dr. Pascal Lux seen today for follow up.  She reports improvement in pain although calves are still tender on exam - no tenderness in thighs or feet,  she reports improvement in BLE edema although on my exam it appears to be about the same currently. No bleeding noted. BLE warm, dry, neurovascularly intact, pulses at baseline. No bleeding noted from bilateral popliteal or right IJ puncture sites. Tolerating SCDs and OOB to chair so far today.  Hgb on 12/31 downtrending (9.7 >> 7.4), she received 1 unit PRBCs with improvement in hgb to 8.3. Hematology was consulted and has recommended continuing heparin gtt at this time as it has the shortest half life, upon discharge would recommend Lovenox. They also recommend monitoring CBC trend closely and if hgb continues to drop/continues to require  periodic transfusions would recommend repeating imaging.   Appreciate and agree with hematology's recommendations. IR recommends continuing SCDs and OOB to chair frequently as tolerated. We will continue to follow along while she is admitted - she will need to follow up with our service as an outpatient 4-6 weeks after d/c for repeat duplex.  Please call IR with questions or concerns.   Electronically Signed: Joaquim Nam, PA-C 05/17/2019, 12:41 PM   I spent a total of 15 Minutes at the the patient's bedside AND on the patient's hospital floor or unit, greater than 50% of which was counseling/coordinating care for thrombectomy follow up.

## 2019-05-17 NOTE — Progress Notes (Signed)
ANTICOAGULATION CONSULT NOTE - Follow Up Consult  Pharmacy Consult for Heparin  Indication: BL PE with R heart strain and LLE DVT   No Known Allergies  Patient Measurements: Height: 5\' 5"  (165.1 cm) Weight: 235 lb (106.6 kg) IBW/kg (Calculated) : 57 Heparin Dosing Weight: 81.8 kg  Vital Signs: Temp: 98.5 F (36.9 C) (01/01 0352) Temp Source: Oral (01/01 0352) BP: 90/41 (01/01 0328) Pulse Rate: 95 (01/01 0352)  Labs: Recent Labs    05/14/19 0756 05/14/19 1200 05/14/19 2345 05/14/19 2350 05/15/19 0713 05/15/19 1630 05/16/19 0230 05/16/19 2335 05/17/19 0221  HGB 10.2*  --   --   --    < >  --  9.7* 7.8* 7.4*  HCT 31.7*  --   --   --    < >  --  30.2* 24.9* 24.2*  PLT 148*  --   --   --    < >  --  192 186 180  APTT  --  32  --   --   --   --   --   --   --   LABPROT  --  14.8  --   --   --   --  13.5  --   --   INR  --  1.2  --   --   --   --  1.0  --   --   HEPARINUNFRC  --   --    < >  --   --  0.21* 0.48  --  0.90*  CREATININE 0.91  --   --  0.90  --   --  0.85  --   --    < > = values in this interval not displayed.    Estimated Creatinine Clearance: 77.9 mL/min (by C-G formula based on SCr of 0.85 mg/dL).  Assessment: Anticoag: SCDs. 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 Ddimer 0. HL <0.1. Hgb 9.8 down. Plts Stable. - 12/30: Discussion with 05-26-2000, keep HL low, no bolus  1/1 AM update:  -Heparin level elevated this AM -Hgb down to 7.4, no obvious bleeding, does have known pelvic hematoma, MD giving 1 unit of bleeding/continuing heparin  Goal of Therapy:  Heparin level 0.3-0.5 units/ml Monitor platelets by anticoagulation protocol: Yes   Plan:  Dec heparin to 1400 units/hr Re-check heparin level at 1200 F/U repeat Hgb after blood is given    Irene Limbo, PharmD, BCPS Clinical Pharmacist Phone: [phone]

## 2019-05-17 NOTE — Progress Notes (Signed)
BP 88/36 - 91/47 mmHg, HR 90-100 after 500 ml of NSS IV bolus.   At  11:35 pm, her Hb 7.8, HCT 24.9.  Recheck Her Hb at 02:21 am, Hb 7.4, Hct 24.2. No bleeding seen from her puncture wound. Her legs swollen, SCD applied, denied pain, patent dorsalis Pedis pulses and posterior Tibial pulses via doppler.    Notified, on-call provider , Linton Flemings, NP. Order received for 1 unit of PRBC.  Blood consent signed, type and screen done. Blood transfusing with no reaction. We will continue to monitor and follow her Hb.   Filiberto Pinks, RN

## 2019-05-17 NOTE — Evaluation (Signed)
Physical Therapy Evaluation Patient Details Name: Regina Munoz MRN: [id number] DOB: March 28, 1951 Today's Date: 05/17/2019   History of Present Illness  69 year old woman discharged on 12/17 following treatment for Covid pneumonia diagnosed 11/30, hospital course complicated by worsening hypoxemia leading to CTA which revealed massive bilateral pulmonary embolism with RV strain, started on heparin infusion, then developed abdominal pain and CT revealed spontaneous left pelvic hematoma. Pt d/c home and returned on 12/25 with progressive fatigue, admitted with sepsis 2/2 PNA and found to have multiple DVTs.  Clinical Impression  Pt presents to PT with deficits in functional mobility, strength, balance, gait, functional mobility, endurance, and power. Pt requriing significant physical assistance to perform bed mobility and sit to stand transfers at this time. Pt transferred OOB with use of STEDY as currently unable to take steps due to weakness and imbalance. Pt will benefit from continued acute PT POC to improve LE strength and activity tolerance in hopes of restoring independent mobility.    Follow Up Recommendations SNF;Supervision/Assistance - 24 hour    Equipment Recommendations  Other (comment)(defer to post-acute setting)    Recommendations for Other Services       Precautions / Restrictions Precautions Precautions: Fall(bleeding) Restrictions Weight Bearing Restrictions: No      Mobility  Bed Mobility Overal bed mobility: Needs Assistance Bed Mobility: Rolling;Sidelying to Sit Rolling: Max assist Sidelying to sit: Max assist          Transfers Overall transfer level: Needs assistance Equipment used: 1 person hand held assist Transfers: Sit to/from Stand Sit to Stand: Max assist         General transfer comment: pt transfers from bed to recliner with use of stedy. Pt performing 4 sit to stand trials during session  Ambulation/Gait Ambulation/Gait assistance:  (attempted pre-gait marching, unable to clear feet)              Stairs            Wheelchair Mobility    Modified Rankin (Stroke Patients Only)       Balance Overall balance assessment: Needs assistance Sitting-balance support: Single extremity supported;Feet supported Sitting balance-Leahy Scale: Good Sitting balance - Comments: supervision   Standing balance support: Bilateral upper extremity supported Standing balance-Leahy Scale: Zero Standing balance comment: modA with BUE support of PT                             Pertinent Vitals/Pain Pain Assessment: Faces Faces Pain Scale: Hurts little more Pain Location: LE Pain Descriptors / Indicators: Grimacing Pain Intervention(s): Limited activity within patient's tolerance    Home Living Family/patient expects to be discharged to:: Private residence Living Arrangements: Spouse/significant other Available Help at Discharge: Family;Available 24 hours/day Type of Home: Mobile home Home Access: Stairs to enter Entrance Stairs-Rails: Right;Left;Can reach both Entrance Stairs-Number of Steps: 2 Home Layout: One level Home Equipment: Walker - 2 wheels;Shower seat - built in;Shower seat;Hand held shower head;Bedside commode      Prior Function Level of Independence: Independent         Comments: Independent, drives and performs iADL     Hand Dominance   Dominant Hand: Right    Extremity/Trunk Assessment   Upper Extremity Assessment Upper Extremity Assessment: Overall WFL for tasks assessed    Lower Extremity Assessment Lower Extremity Assessment: Generalized weakness    Cervical / Trunk Assessment Cervical / Trunk Assessment: Other exceptions Cervical / Trunk Exceptions: body habitus  Communication  Communication: No difficulties  Cognition Arousal/Alertness: Awake/alert Behavior During Therapy: WFL for tasks assessed/performed Overall Cognitive Status: Within Functional Limits for  tasks assessed                                        General Comments General comments (skin integrity, edema, etc.): VSS, pt on RA    Exercises     Assessment/Plan    PT Assessment Patient needs continued PT services  PT Problem List Decreased strength;Decreased activity tolerance;Decreased balance;Decreased mobility;Decreased knowledge of use of DME;Decreased safety awareness;Decreased knowledge of precautions;Cardiopulmonary status limiting activity       PT Treatment Interventions DME instruction;Gait training;Stair training;Functional mobility training;Therapeutic activities;Therapeutic exercise;Balance training;Neuromuscular re-education;Patient/family education    PT Goals (Current goals can be found in the Care Plan section)  Acute Rehab PT Goals Patient Stated Goal: get her strength back  PT Goal Formulation: With patient Time For Goal Achievement: 05/31/19 Potential to Achieve Goals: Good    Frequency Min 2X/week   Barriers to discharge        Co-evaluation               AM-PAC PT "6 Clicks" Mobility  Outcome Measure Help needed turning from your back to your side while in a flat bed without using bedrails?: Total Help needed moving from lying on your back to sitting on the side of a flat bed without using bedrails?: Total Help needed moving to and from a bed to a chair (including a wheelchair)?: Total Help needed standing up from a chair using your arms (e.g., wheelchair or bedside chair)?: Total Help needed to walk in hospital room?: Total Help needed climbing 3-5 steps with a railing? : Total 6 Click Score: 6    End of Session   Activity Tolerance: Patient tolerated treatment well Patient left: in chair;with call bell/phone within reach;with family/visitor present Nurse Communication: Mobility status;Need for lift equipment PT Visit Diagnosis: Muscle weakness (generalized) (M62.81)    Time: 1610-9604 PT Time Calculation (min)  (ACUTE ONLY): 30 min   Charges:   PT Evaluation $PT Eval Moderate Complexity: 1 Mod PT Treatments $Therapeutic Activity: 8-22 mins        Arlyss Gandy, PT, DPT Acute Rehabilitation Pager: [phone]   Arlyss Gandy 05/17/2019, 11:28 AM

## 2019-05-17 NOTE — Progress Notes (Signed)
Oncology was called regarding anticoagulation option.  Review of CBC on 05/16/2019 showed Hgb downtrending from 9.7 to 7.8, and was 7.4 this morning, for which she received 1 unit RBC with improvement in Hgb.   Due to the patient's recent pelvic hematoma, the drop in Hgb may be hemodilution, but recurrent bleeding is certainly on the differential.  Therefore, I would not recommend changing the anticoagulation regimen from heparin to another agent yet, especially if she develops recurrent bleeding, and heparin has the shortest half life.   I would recommend monitoring her CBC trend closely, and if her Hgb continues to drop or she still requires periodic RBC transfusion, I would recommend repeating imaging studies to rule out recurrent bleeding.   Arthur Holms, MD

## 2019-05-17 NOTE — Progress Notes (Addendum)
ANTICOAGULATION CONSULT NOTE - Follow Up Consult  Pharmacy Consult for Heparin  Indication: BL PE with R heart strain and LLE DVT   No Known Allergies  Patient Measurements: Height: 5\' 5"  (165.1 cm) Weight: 235 lb (106.6 kg) IBW/kg (Calculated) : 57 Heparin Dosing Weight: 81.8 kg  Vital Signs: Temp: 98.1 F (36.7 C) (01/01 0715) Temp Source: Oral (01/01 0715) BP: 110/54 (01/01 0728) Pulse Rate: 90 (01/01 0728)  Labs: Recent Labs    05/14/19 1200 05/14/19 2345 05/14/19 2350 05/15/19 0713 05/16/19 0230 05/16/19 2335 05/17/19 0221 05/17/19 0841  HGB  --   --   --    < > 9.7* 7.8* 7.4* 8.3*  HCT  --   --   --    < > 30.2* 24.9* 24.2* 26.8*  PLT  --   --   --    < > 192 186 180 185  APTT 32  --   --   --   --   --   --   --   LABPROT 14.8  --   --   --  13.5  --   --   --   INR 1.2  --   --   --  1.0  --   --   --   HEPARINUNFRC  --    < >  --   --  0.48  --  0.90* 0.63  CREATININE  --   --  0.90  --  0.85  --   --   --    < > = values in this interval not displayed.    Estimated Creatinine Clearance: 77.9 mL/min (by C-G formula based on SCr of 0.85 mg/dL).  Assessment: 24 yof with submassive BL PE with R heart strain and LLE DVT (hx of COVID 03/2019), had IVC filter placed 12/14. No anticoagulation PTA. Patient's hemoglobin has been steadily decreasing requiring transfusions. Per hem/onc MD continue heparin infusion even with hemoglobin downward trend. Patient has a hx of recent pelvic hematoma and is s/p thrombectomy on 12/21.   Heparin level was elevated at 4 am this morning, so follow up heparin level was ordered to be drawn at 1200 this afternoon after a rate decrease. A 4 hour heparin level was drawn instead and resulted to be supratherapeutic at 0.63. Her H&H today is okay today at 8.3/26.8 with plts wnl at 185 s/p 1 units RBC transfusion this am.    *afternoon addendum* the repeat heparin level ordered for 1500 was drawn at 1400 and shows a slightly subtherapeutic  heparin level of 0.29.   Goal of Therapy:  Heparin level 0.3-0.5 units/ml Monitor platelets by anticoagulation protocol: Yes   Plan:  Will hold bolus due to bleeding and low Hgb levels Increase heparin infusion to 1500 units/hour  Heparin level and CBC daily Monitor for signs of bleeding  Follow up for transition to oral anticoagulation    Thank you,   10-02-1985, PharmD PGY-1 Pharmacy Resident   Please check amion for clinical pharmacist contact number

## 2019-05-17 NOTE — Progress Notes (Addendum)
PROGRESS NOTE  Regina Munoz SAY:[id number] DOB: July 28, 1950 DOA: 05/10/2019 PCP: [name], DO   LOS: 6 days   Brief narrative: 69 year old woman discharged on 12/17 following treatment for Covid pneumonia diagnosed 11/30, hospital course complicated by worsening hypoxemia leading to CTA which revealed massive bilateral pulmonary embolism with RV strain, started on heparin infusion, then developed abdominal pain and CT revealed spontaneous left pelvic hematoma.  IR consulted for possible embolization but patient was stabilized after transfusion.  Left lower extremity venous ultrasound showed DVT.  IR was consulted and IVC filter placed 12/14.  She was subsequently discharged home on oxygen, no anticoagulation.  She was seen both by interventional radiology and pulmonology during that hospitalization.  Patient then presented on 12/25 with increasing fatigue.  Patient was admitted for severe sepsis from pneumonia, subsequently complained of increasing lower extremity edema and pain, ultrasound revealed extensive bilateral lower extremity DVTs with progression with concern for involvement into the pelvis and even to IVC filter.  She was transferred to [facility] for further evaluation.  She was seen by vascular surgery, but in the absence of anticoagulation, no intervention was recommended.  Interventional radiology and hematology were subsequently consulted and patient was started on heparin drip.  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  Patient presented with with tachycardia, tachypnea and lactic acidosis with acute hypoxic respiratory failure. Chest  x-ray with opacity left lung base.    Patient was empirically started on vancomycin and cefepime due to history of recent Covid pneumonia. -- MRSA PCR was negative on December 8.    Vancomycin was discontinued.    Aim for 7-day course.  Blood cultures negative at 5 days.  Patient is on room air saturating 95% today.  Extensive, progressive bilateral lower extremity DVT with concern for progression to the iliac and IVC with history of IVC filter. Dr. Grace Isaac IR and Dr. Pamelia Hoit, oncology on board and patient was started on heparin drip.  Hemoglobin remained stable on heparin drip.  IR followed the patient and the underwent mechanical thrombectomy May 16, 2019.  On  heparin drip.  I spoke with Dr.  Dion Body Oncology today and recommends continued heparin drip for now due to drifting down hemoglobin is noted.  Patient might need further PRBC transfusion.  Treatment of choice on discharge would be Lovenox.  Submassive bilateral PE with right-sided heart strain by CT, diagnosed previous admission (discharged 05/02/2019).   On heparin drip, s/p mechanical thrombectomy, patient has IVC filter in place.  Switch to oral anticoagulation if okay with hematooncology  Extensive large pelvic hematoma on previous hospitalization (discharged 12/17) displacing bowel and compressing bladder, likely originating from left rectus abdominis muscle.  Patient has been moving bowels with laxatives.  No significant interval change on CT 12/27 compared to previous study. Hemoglobin of 7.4 from 7.8> 9.7.    Renal function normal with creatinine of 0.9.  Will closely monitor hemoglobin levels.  If continues to drift might need CT scan.  Continue heparin drip for now.  Thrombocytopenia, on admission   resolved  Hyponatremia.   Will check BMP in a.m. Last sodium of 131.  Constipation.  Improved after enema and aggressive bowel regimen.  Patient has been having bowel movements without  Diabetes mellitus type 2 -Continue sliding  scale insulin, Lantus, diabetic diet.  Accu-Cheks..  Closely monitor.  Point-of-care glucose was 149  Status post COVID-19 viral pneumonia treated with remdesivir and Decadron with discharge 12/17.  Initially Covid + 04/15/2019.  VTE Prophylaxis: Heparin drip  Code Status: Full code  Family Communication: None today  Disposition Plan: Patient was seen by physical therapy today and recommended skilled nursing facility.  Will consult transition of care.  Closely monitor hemoglobin levels.  Continue heparin drip for now.  Consultants:  Hematooncology  Interventional radiology  Vascular surgery  Procedures: Mechanical thrombectomy May 16, 2019.  Antibiotics: Anti-infectives (From admission, onward)   Start     Dose/Rate Route Frequency Ordered Stop   05/12/19 0000  vancomycin (VANCOCIN) IVPB 1000 mg/200 mL premix  Status:  Discontinued     1,000 mg 200 mL/hr over 60 Minutes Intravenous Every 24 hours 05/11/19 0108 05/14/19 1052   05/11/19 1200  ceFEPIme (MAXIPIME) 2 g in sodium chloride 0.9 % 100 mL IVPB     2 g 200 mL/hr over 30 Minutes Intravenous Every 12 hours 05/11/19 0611 05/17/19 2159   05/11/19 0115  vancomycin (VANCOREADY) IVPB 2000 mg/400 mL     2,000 mg 200 mL/hr over 120 Minutes Intravenous  Once 05/11/19 0100 05/11/19 0350   05/11/19 0100  ceFEPIme (MAXIPIME) 1 g in sodium chloride 0.9 % 100 mL IVPB     1 g 200 mL/hr over 30 Minutes Intravenous  Once 05/11/19 0054 05/11/19 0443      Subjective: Today, patient continues to feel better.  Denies chest pain, shortness of breath, fever or chills.  Legs feel better with less tenderness today.  Objective: Vitals:   05/17/19 0728 05/17/19 0751  BP: (!) 110/54   Pulse: 90   Resp: (!) 23   Temp:    SpO2: 96% 95%    Intake/Output Summary (Last 24 hours) at 05/17/2019 0818 Last data filed at 05/17/2019 0715 Gross per 24 hour  Intake 4631.97 ml  Output 700 ml  Net 3931.97 ml   Filed Weights   05/10/19 2352   Weight: 106.6 kg   Body mass index is 39.11 kg/m.   Physical Exam:  General: Obese, not in obvious distress HENT: Normocephalic, pupils equally reacting to light and accommodation.  No scleral pallor or icterus noted. Oral mucosa is moist.  Chest:  Clear breath sounds.  Diminished breath sounds bilaterally. No crackles or wheezes.  CVS: S1 &S2 heard. No murmur.  Regular rate and rhythm. Abdomen: Soft, nontender, nondistended.  Bowel sounds are heard.  Liver is not palpable, no abdominal mass palpated Extremities: No cyanosis, clubbing but with bilateral lower extremity pitting edema.  Less tender today.  Compression bandage noted.  Peripheral pulses are palpable. Psych: Alert, awake and oriented, normal mood CNS:  No cranial nerve deficits.  Power equal in all extremities.  No sensory deficits noted.  No cerebellar signs.   Skin: Warm and dry.  No rashes noted.   Data Review: I have personally reviewed the following laboratory data and studies,  CBC: Recent Labs  Lab 05/10/19 2358 05/11/19 0316 05/15/19 1151 05/15/19 1807 05/16/19 0230 05/16/19 2335 05/17/19 0221  WBC 16.1* 16.6* 11.7* 12.2* 12.4* 12.9* 13.9*  NEUTROABS 12.3* 12.2*  --   --   --  8.1*  --   HGB 11.7* 11.6* 9.6* 9.9* 9.7* 7.8* 7.4*  HCT 38.1 38.4  29.7* 31.6* 30.2* 24.9* 24.2*  MCV 89.2 90.1 85.3 86.1 85.3 87.4 88.3  PLT 166 PLATELET CLUMPS NOTED ON SMEAR, COUNT APPEARS ADEQUATE 173 183 192 186 180   Basic Metabolic Panel: Recent Labs  Lab 05/11/19 0316 05/12/19 0609 05/14/19 0756 05/14/19 2350 05/16/19 0230  NA 131* 129* 128* 128* 131*  K 5.3* 4.4 4.8 4.8 4.3  CL 101 98 100 102 104  CO2 16* 18* 17* 19* 19*  GLUCOSE 220* 233* 227* 240* 177*  BUN 21 27* 29* 25* 20  CREATININE 1.29* 1.19* 0.91 0.90 0.85  CALCIUM 9.1 9.5 9.1 8.8* 8.7*  MG  --  1.9  --   --  2.0  PHOS  --  3.3  --   --   --    Liver Function Tests: Recent Labs  Lab 05/10/19 2358 05/11/19 0316 05/12/19 0609 05/16/19 0230  AST  22 23 17 24   ALT 16 13 13 16   ALKPHOS 86 77 76 87  BILITOT 2.3* 2.1* 2.2* 1.0  PROT 8.5* 7.9 8.0 6.4*  ALBUMIN 4.2 3.9 3.7 2.6*   No results for input(s): LIPASE, AMYLASE in the last 168 hours. No results for input(s): AMMONIA in the last 168 hours. Cardiac Enzymes: No results for input(s): CKTOTAL, CKMB, CKMBINDEX, TROPONINI in the last 168 hours. BNP (last 3 results) Recent Labs    04/24/19 0500 05/10/19 2358  BNP 193.7* 20.0    ProBNP (last 3 results) No results for input(s): PROBNP in the last 8760 hours.  CBG: Recent Labs  Lab 05/16/19 0746 05/16/19 1123 05/16/19 1852 05/16/19 2109 05/17/19 0559  GLUCAP 160* 149* 186* 195* 149*   Recent Results (from the past 240 hour(s))  Culture, blood (routine x 2) Call MD if unable to obtain prior to antibiotics being given     Status: None   Collection Time: 05/11/19  1:22 AM   Specimen: BLOOD LEFT HAND  Result Value Ref Range Status   Specimen Description BLOOD LEFT HAND  Final   Special Requests   Final    BOTTLES DRAWN AEROBIC AND ANAEROBIC Blood Culture adequate volume   Culture   Final    NO GROWTH 5 DAYS Performed at [facility], [address] [place], [place] [address]    Report Status 05/16/2019 FINAL  Final  Culture, blood (routine x 2) Call MD if unable to obtain prior to antibiotics being given     Status: None   Collection Time: 05/11/19  3:16 AM   Specimen: BLOOD LEFT HAND  Result Value Ref Range Status   Specimen Description BLOOD LEFT HAND  Final   Special Requests   Final    BOTTLES DRAWN AEROBIC ONLY Blood Culture results may not be optimal due to an inadequate volume of blood received in culture bottles   Culture   Final    NO GROWTH 5 DAYS Performed at [facility], [address] [place], [place] [address]    Report Status 05/16/2019 FINAL  Final  Surgical PCR screen     Status: None   Collection Time: 05/16/19  6:09 AM   Specimen: Nasal Mucosa; Nasal Swab  Result Value Ref Range Status    MRSA, PCR NEGATIVE NEGATIVE Final   Staphylococcus aureus NEGATIVE NEGATIVE Final    Comment: (NOTE) The Xpert SA Assay (FDA approved for NASAL specimens in patients 68 years of age and older), is one component of a comprehensive surveillance program. It is not intended to diagnose infection nor to guide or monitor  treatment. Performed at [facility], 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 She presents today for mechanical thrombectomy. EXAM: ULTRASOUND-GUIDED ACCESS RIGHT INTERNAL JUGULAR VEIN ULTRASOUND GUIDED ACCESS LEFT POPLITEAL VEIN ULTRASOUND GUIDED ACCESS RIGHT POPLITEAL VEIN MECHANICAL THROMBECTOMY/ASPIRATION THROMBECTOMY OF ACUTE THROMBUS OF IVC, BILATERAL ILIAC VEINS, BILATERAL FEMORAL VEINS COMPARISON:  CT IMAGING 05/12/2019 MEDICATIONS: 22000 units IV heparin ANESTHESIA/SEDATION: Versed 6.0 mg IV; Fentanyl 200 mcg IV Moderate Sedation Time:  211 The patient was continuously monitored during the procedure by the interventional radiology nurse under my direct supervision. FLUOROSCOPY TIME:  Fluoroscopy Time: 20 minutes 6 seconds (321 mGy). COMPLICATIONS: None TECHNIQUE: Operators: Dr. Katherina Right Dr. Gilmer Mor Informed written consent was obtained from the patient after a thorough discussion of the procedural risks, benefits and alternatives. All questions were addressed. Maximal Sterile Barrier Technique was utilized including caps, mask, sterile gowns, sterile gloves, sterile drape, hand hygiene and skin antiseptic. A timeout was performed prior to the initiation of the procedure. Patient is brought to the IR suite and placed on the table supine position. Her identity was confirmed. The right neck was prepped and draped in the usual sterile fashion. 1%  lidocaine was used for local anesthesia. Ultrasound survey of the internal jugular vein was performed with images stored sent to PACs confirming patency. A micropuncture needle was used access the right internal jugular vein artery under ultrasound. With excellent venous blood flow returned, and an .018 micro wire was passed through the needle, observed to enter the superior vena cava. The needle was removed, and a micropuncture sheath was placed over the wire. The inner dilator and wire were removed, and an 035 Bentson wire was advanced under fluoroscopy into the IVC. With the wire in the IVC, a stab incision was made it the skin. Serial dilation 214 [place] was a treat. A 14 French 45 cm cook sheath was then placed over the wire into the IVC. The wire was removed, the sheath was flushed, and the sheath was secured at the IJ puncture site with sterile dressing. The patient was then flipped into a prone position on the IR table. The bilateral popliteal region were prepped and draped in the usual sterile fashion. Ultrasound survey of the left popliteal region was performed with images stored and sent to PACs, confirming the target of the posterior tibial vein. Flow confirmed within the vein. A micropuncture needle was used access the left posterior tibial vein. With venous blood flow returned, and an .018 micro wire was passed through the needle, observed enter the femoral vein under fluoroscopy. The needle was removed, and a micropuncture sheath was placed over the wire. The inner dilator and wire were removed, and an stiff Glidewire was advanced under fluoroscopy into the femoral vein. The sheath was removed and a standard 6 French sheath was placed. The dilator was removed and the sheath was flushed. Glidewire was then advanced using a angled crossing catheter into the iliac vein. Ultrasound survey of the right popliteal region was performed with images stored and sent to PACs, confirming the target of the  popliteal vein. Flow confirmed within the vein. A micropuncture needle was used access the right popliteal vein. With venous blood flow returned, and an .018 micro wire was passed through the needle, observed enter  the right femoral vein under fluoroscopy. The needle was removed, and a micropuncture sheath was placed over the wire. The inner dilator and wire were removed, and an stiff Glidewire was advanced under fluoroscopy into the femoral vein. The sheath was removed and a standard 6 French sheath was placed. The dilator was removed and the sheath was flushed. Glidewire was then advanced using a angled crossing catheter into the iliac vein. Crossing catheter and the Glidewire were used to navigate into the most inferior aspect of the 14 French IJ sheath. The wire was then externalized, and the 135 cm crossing catheter was externalized through the check flow valve. A stiff exchange length Amplatz wire was then placed through the crossing catheter as a flossing wire. Crossing catheter was removed. The sheath was then advanced over the wire below the tines of the IVC filter, and then a rendezvous was performed with the Glidewire from the right popliteal access using a angled catheter. With the crossing catheter within the sheath, the glide wires removed and 8 exchange length stiff Amplatz wire was placed into the 14 French sheath. Serial dilation of the bilateral popliteal access then performed up to 16 [place]. Sixteen [place] proprietary Inari sheats were then placed in the bilateral popliteal access. We then initiated mechanical thrombectomy using the clot triever device from the right popliteal region. Fluoroscopic observation was performed, confirming that the device was initiated each time below the tines of the IVC filter. 4-5 passes of the device were performed before a angiogram was performed of the iliac and femoral vein through a coaxial angled catheter on the right. With resolution of the majority of  thrombus on the right, we turned our attention to the left. Mechanical thrombectomy was performed on the left using the Clot Treever device. After 4-5 passes on the left side, a gentle angiogram was performed to the level of the iliac confluence. Once the clot within the bilateral iliac and femoral veins was resolved, we initiated therapy for the residual thrombus in the apex of the IVC. Twenty French dilation was performed of the right popliteal vein access. The clot tree vert device was then removed and the 20 French flow tree vert device was passed over the stiff wire into the apex of the filter. The wire was withdrawn and flow tree vert device was used for aspirating thrombus in the apex of the filter. We then passed angled catheters into the bilateral iliac veins through the access for a final angiogram confirming restoration of flow through the bilateral femoral veins, iliac veins, and the IVC and IVC filter. We attempted a balloon angioplasty for mechanical disruption of residual thrombus in the lower femoral vein on the right. Sequential 8 mm in 12 m renal balloon angioplasty performed, with no waist identified and without resolution of the thrombus. We elected withdraw at this point. Pursestring sutures placed at the bilateral popliteal vein access with pressure dressings applied and manual pressure used for hemostasis. The patient was flipped into supine position and the right IJ sheath was removed with manual pressure for hemostasis. The patient tolerated the procedure well and remained hemodynamically stable throughout. No complications were encountered and no significant blood loss. FINDINGS: Initial ultrasound images demonstrate patency of the right internal jugular vein, persistent thrombus of the bilateral popliteal veins. 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 After mechanical and  aspiration thrombectomy there is restoration of flow through the entire  bilateral iliofemoral and ilio caval segments through the filter. IMPRESSION: Status post ultrasound-guided access of right internal jugular vein and bilateral popliteal vein for mechanical/aspiration thrombectomy of extensive iliocaval and iliofemoral DVT, with restoration of flow from the bilateral popliteal veins through the IVC filter via Inari device. Signed, Yvone Neu. Reyne Dumas, RPVI [facility] Electronically Signed   By: Gilmer Mor D.O.   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 She presents today for mechanical thrombectomy. EXAM: ULTRASOUND-GUIDED ACCESS RIGHT INTERNAL JUGULAR VEIN ULTRASOUND GUIDED ACCESS LEFT POPLITEAL VEIN ULTRASOUND GUIDED ACCESS RIGHT POPLITEAL VEIN MECHANICAL THROMBECTOMY/ASPIRATION THROMBECTOMY OF ACUTE THROMBUS OF IVC, BILATERAL ILIAC VEINS, BILATERAL FEMORAL VEINS COMPARISON:  CT IMAGING 05/12/2019 MEDICATIONS: 22000 units IV heparin ANESTHESIA/SEDATION: Versed 6.0 mg IV; Fentanyl 200 mcg IV Moderate Sedation Time:  211 The patient was continuously monitored during the procedure by the interventional radiology nurse under my direct supervision. FLUOROSCOPY TIME:  Fluoroscopy Time: 20 minutes 6 seconds (321 mGy). COMPLICATIONS: None TECHNIQUE: Operators: Dr. Katherina Right Dr. Gilmer Mor Informed written consent was obtained from the patient after a thorough discussion of the procedural risks, benefits and alternatives. All questions were addressed. Maximal Sterile Barrier Technique was utilized including caps, mask, sterile gowns, sterile gloves, sterile drape, hand hygiene and skin antiseptic. A timeout was performed prior to the initiation of the  procedure. Patient is brought to the IR suite and placed on the table supine position. Her identity was confirmed. The right neck was prepped and draped in the usual sterile fashion. 1% lidocaine was used for local anesthesia. Ultrasound survey of the internal jugular vein was performed with images stored sent to PACs confirming patency. A micropuncture needle was used access the right internal jugular vein artery under ultrasound. With excellent venous blood flow returned, and an .018 micro wire was passed through the needle, observed to enter the superior vena cava. The needle was removed, and a micropuncture sheath was placed over the wire. The inner dilator and wire were removed, and an 035 Bentson wire was advanced under fluoroscopy into the IVC. With the wire in the IVC, a stab incision was made it the skin. Serial dilation 214 [place] was a treat. A 14 French 45 cm cook sheath was then placed over the wire into the IVC. The wire was removed, the sheath was flushed, and the sheath was secured at the IJ puncture site with sterile dressing. The patient was then flipped into a prone position on the IR table. The bilateral popliteal region were prepped and draped in the usual sterile fashion. Ultrasound survey of the left popliteal region was performed with images stored and sent to PACs, confirming the target of the posterior tibial vein. Flow confirmed within the vein. A micropuncture needle was used access the left posterior tibial vein. With venous blood flow returned, and an .018 micro wire was passed through the needle, observed enter the femoral vein under fluoroscopy. The needle was removed, and a micropuncture sheath was placed over the wire. The inner dilator and wire were removed, and an stiff Glidewire was advanced under fluoroscopy into the femoral vein. The sheath was removed and a standard 6 French sheath was placed. The dilator was removed and the sheath was flushed. Glidewire was then advanced  using a angled crossing catheter into  the iliac vein. Ultrasound survey of the right popliteal region was performed with images stored and sent to PACs, confirming the target of the popliteal vein. Flow confirmed within the vein. A micropuncture needle was used access the right popliteal vein. With venous blood flow returned, and an .018 micro wire was passed through the needle, observed enter the right femoral vein under fluoroscopy. The needle was removed, and a micropuncture sheath was placed over the wire. The inner dilator and wire were removed, and an stiff Glidewire was advanced under fluoroscopy into the femoral vein. The sheath was removed and a standard 6 French sheath was placed. The dilator was removed and the sheath was flushed. Glidewire was then advanced using a angled crossing catheter into the iliac vein. Crossing catheter and the Glidewire were used to navigate into the most inferior aspect of the 14 French IJ sheath. The wire was then externalized, and the 135 cm crossing catheter was externalized through the check flow valve. A stiff exchange length Amplatz wire was then placed through the crossing catheter as a flossing wire. Crossing catheter was removed. The sheath was then advanced over the wire below the tines of the IVC filter, and then a rendezvous was performed with the Glidewire from the right popliteal access using a angled catheter. With the crossing catheter within the sheath, the glide wires removed and 8 exchange length stiff Amplatz wire was placed into the 14 French sheath. Serial dilation of the bilateral popliteal access then performed up to 16 [place]. Sixteen [place] proprietary Inari sheats were then placed in the bilateral popliteal access. We then initiated mechanical thrombectomy using the clot triever device from the right popliteal region. Fluoroscopic observation was performed, confirming that the device was initiated each time below the tines of the IVC filter. 4-5  passes of the device were performed before a angiogram was performed of the iliac and femoral vein through a coaxial angled catheter on the right. With resolution of the majority of thrombus on the right, we turned our attention to the left. Mechanical thrombectomy was performed on the left using the Clot Treever device. After 4-5 passes on the left side, a gentle angiogram was performed to the level of the iliac confluence. Once the clot within the bilateral iliac and femoral veins was resolved, we initiated therapy for the residual thrombus in the apex of the IVC. Twenty French dilation was performed of the right popliteal vein access. The clot tree vert device was then removed and the 20 French flow tree vert device was passed over the stiff wire into the apex of the filter. The wire was withdrawn and flow tree vert device was used for aspirating thrombus in the apex of the filter. We then passed angled catheters into the bilateral iliac veins through the access for a final angiogram confirming restoration of flow through the bilateral femoral veins, iliac veins, and the IVC and IVC filter. We attempted a balloon angioplasty for mechanical disruption of residual thrombus in the lower femoral vein on the right. Sequential 8 mm in 12 m renal balloon angioplasty performed, with no waist identified and without resolution of the thrombus. We elected withdraw at this point. Pursestring sutures placed at the bilateral popliteal vein access with pressure dressings applied and manual pressure used for hemostasis. The patient was flipped into supine position and the right IJ sheath was removed with manual pressure for hemostasis. The patient tolerated the procedure well and remained hemodynamically stable throughout. No complications were encountered and no  significant blood loss. FINDINGS: Initial ultrasound images demonstrate patency of the right internal jugular vein, persistent thrombus of the bilateral popliteal  veins. 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 After mechanical and aspiration thrombectomy there is restoration of flow through the entire bilateral iliofemoral and ilio caval segments through the filter. IMPRESSION: Status post ultrasound-guided access of right internal jugular vein and bilateral popliteal vein for mechanical/aspiration thrombectomy of extensive iliocaval and iliofemoral DVT, with restoration of flow from the bilateral popliteal veins through the IVC filter via Inari device. Signed, Yvone Neu. Reyne Dumas, RPVI [facility] Electronically Signed   By: Gilmer Mor D.O.   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 She presents today for mechanical thrombectomy. EXAM: ULTRASOUND-GUIDED ACCESS RIGHT INTERNAL JUGULAR VEIN ULTRASOUND GUIDED ACCESS LEFT POPLITEAL VEIN ULTRASOUND GUIDED ACCESS RIGHT POPLITEAL VEIN MECHANICAL THROMBECTOMY/ASPIRATION THROMBECTOMY OF ACUTE THROMBUS OF IVC, BILATERAL ILIAC VEINS, BILATERAL FEMORAL VEINS COMPARISON:  CT IMAGING 05/12/2019 MEDICATIONS: 22000 units IV heparin ANESTHESIA/SEDATION: Versed 6.0 mg IV; Fentanyl 200 mcg IV Moderate Sedation Time:  211 The patient was continuously monitored during the procedure by the interventional radiology nurse under my direct supervision. FLUOROSCOPY TIME:  Fluoroscopy Time: 20 minutes 6 seconds (321 mGy). COMPLICATIONS: None TECHNIQUE: Operators: Dr. Katherina Right Dr. Gilmer Mor Informed written consent was obtained from the patient after a thorough discussion of the procedural risks, benefits and alternatives. All questions were addressed. Maximal Sterile  Barrier Technique was utilized including caps, mask, sterile gowns, sterile gloves, sterile drape, hand hygiene and skin antiseptic. A timeout was performed prior to the initiation of the procedure. Patient is brought to the IR suite and placed on the table supine position. Her identity was confirmed. The right neck was prepped and draped in the usual sterile fashion. 1% lidocaine was used for local anesthesia. Ultrasound survey of the internal jugular vein was performed with images stored sent to PACs confirming patency. A micropuncture needle was used access the right internal jugular vein artery under ultrasound. With excellent venous blood flow returned, and an .018 micro wire was passed through the needle, observed to enter the superior vena cava. The needle was removed, and a micropuncture sheath was placed over the wire. The inner dilator and wire were removed, and an 035 Bentson wire was advanced under fluoroscopy into the IVC. With the wire in the IVC, a stab incision was made it the skin. Serial dilation 214 [place] was a treat. A 14 French 45 cm cook sheath was then placed over the wire into the IVC. The wire was removed, the sheath was flushed, and the sheath was secured at the IJ puncture site with sterile dressing. The patient was then flipped into a prone position on the IR table. The bilateral popliteal region were prepped and draped in the usual sterile fashion. Ultrasound survey of the left popliteal region was performed with images stored and sent to PACs, confirming the target of the posterior tibial vein. Flow confirmed within the vein. A micropuncture needle was used access the left posterior tibial vein. With venous blood flow returned, and an .018 micro wire was passed through the needle, observed enter the femoral vein under fluoroscopy. The needle was  removed, and a micropuncture sheath was placed over the wire. The inner dilator and wire were removed, and an stiff Glidewire was advanced  under fluoroscopy into the femoral vein. The sheath was removed and a standard 6 French sheath was placed. The dilator was removed and the sheath was flushed. Glidewire was then advanced using a angled crossing catheter into the iliac vein. Ultrasound survey of the right popliteal region was performed with images stored and sent to PACs, confirming the target of the popliteal vein. Flow confirmed within the vein. A micropuncture needle was used access the right popliteal vein. With venous blood flow returned, and an .018 micro wire was passed through the needle, observed enter the right femoral vein under fluoroscopy. The needle was removed, and a micropuncture sheath was placed over the wire. The inner dilator and wire were removed, and an stiff Glidewire was advanced under fluoroscopy into the femoral vein. The sheath was removed and a standard 6 French sheath was placed. The dilator was removed and the sheath was flushed. Glidewire was then advanced using a angled crossing catheter into the iliac vein. Crossing catheter and the Glidewire were used to navigate into the most inferior aspect of the 14 French IJ sheath. The wire was then externalized, and the 135 cm crossing catheter was externalized through the check flow valve. A stiff exchange length Amplatz wire was then placed through the crossing catheter as a flossing wire. Crossing catheter was removed. The sheath was then advanced over the wire below the tines of the IVC filter, and then a rendezvous was performed with the Glidewire from the right popliteal access using a angled catheter. With the crossing catheter within the sheath, the glide wires removed and 8 exchange length stiff Amplatz wire was placed into the 14 French sheath. Serial dilation of the bilateral popliteal access then performed up to 16 [place]. Sixteen [place] proprietary Inari sheats were then placed in the bilateral popliteal access. We then initiated mechanical thrombectomy using the  clot triever device from the right popliteal region. Fluoroscopic observation was performed, confirming that the device was initiated each time below the tines of the IVC filter. 4-5 passes of the device were performed before a angiogram was performed of the iliac and femoral vein through a coaxial angled catheter on the right. With resolution of the majority of thrombus on the right, we turned our attention to the left. Mechanical thrombectomy was performed on the left using the Clot Treever device. After 4-5 passes on the left side, a gentle angiogram was performed to the level of the iliac confluence. Once the clot within the bilateral iliac and femoral veins was resolved, we initiated therapy for the residual thrombus in the apex of the IVC. Twenty French dilation was performed of the right popliteal vein access. The clot tree vert device was then removed and the 20 French flow tree vert device was passed over the stiff wire into the apex of the filter. The wire was withdrawn and flow tree vert device was used for aspirating thrombus in the apex of the filter. We then passed angled catheters into the bilateral iliac veins through the access for a final angiogram confirming restoration of flow through the bilateral femoral veins, iliac veins, and the IVC and IVC filter. We attempted a balloon angioplasty for mechanical disruption of residual thrombus in the lower femoral vein on the right. Sequential 8 mm in 12 m renal balloon angioplasty performed, with no waist identified and without resolution of the thrombus.  We elected withdraw at this point. Pursestring sutures placed at the bilateral popliteal vein access with pressure dressings applied and manual pressure used for hemostasis. The patient was flipped into supine position and the right IJ sheath was removed with manual pressure for hemostasis. The patient tolerated the procedure well and remained hemodynamically stable throughout. No complications were  encountered and no significant blood loss. FINDINGS: Initial ultrasound images demonstrate patency of the right internal jugular vein, persistent thrombus of the bilateral popliteal veins. 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 After mechanical and aspiration thrombectomy there is restoration of flow through the entire bilateral iliofemoral and ilio caval segments through the filter. IMPRESSION: Status post ultrasound-guided access of right internal jugular vein and bilateral popliteal vein for mechanical/aspiration thrombectomy of extensive iliocaval and iliofemoral DVT, with restoration of flow from the bilateral popliteal veins through the IVC filter via Inari device. Signed, Yvone Neu. Reyne Dumas, RPVI [facility] Interventional Radiology Special[facility] Electronically Signed   By: Gilmer Mor D.O.   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 She presents today for mechanical thrombectomy. EXAM: ULTRASOUND-GUIDED ACCESS RIGHT INTERNAL JUGULAR VEIN ULTRASOUND GUIDED ACCESS LEFT POPLITEAL VEIN ULTRASOUND GUIDED ACCESS RIGHT POPLITEAL VEIN MECHANICAL THROMBECTOMY/ASPIRATION THROMBECTOMY OF ACUTE THROMBUS OF IVC, BILATERAL ILIAC VEINS, BILATERAL FEMORAL VEINS COMPARISON:  CT IMAGING 05/12/2019 MEDICATIONS: 22000 units IV heparin ANESTHESIA/SEDATION: Versed 6.0 mg IV; Fentanyl 200 mcg IV Moderate Sedation Time:  211 The patient was continuously monitored during the procedure by the interventional radiology nurse under my direct supervision. FLUOROSCOPY TIME:  Fluoroscopy Time: 20 minutes 6 seconds (321 mGy). COMPLICATIONS: None TECHNIQUE: Operators: Dr. Katherina Right Dr. Gilmer Mor  Informed written consent was obtained from the patient after a thorough discussion of the procedural risks, benefits and alternatives. All questions were addressed. Maximal Sterile Barrier Technique was utilized including caps, mask, sterile gowns, sterile gloves, sterile drape, hand hygiene and skin antiseptic. A timeout was performed prior to the initiation of the procedure. Patient is brought to the IR suite and placed on the table supine position. Her identity was confirmed. The right neck was prepped and draped in the usual sterile fashion. 1% lidocaine was used for local anesthesia. Ultrasound survey of the internal jugular vein was performed with images stored sent to PACs confirming patency. A micropuncture needle was used access the right internal jugular vein artery under ultrasound. With excellent venous blood flow returned, and an .018 micro wire was passed through the needle, observed to enter the superior vena cava. The needle was removed, and a micropuncture sheath was placed over the wire. The inner dilator and wire were removed, and an 035 Bentson wire was advanced under fluoroscopy into the IVC. With the wire in the IVC, a stab incision was made it the skin. Serial dilation 214 [place] was a treat. A 14 French 45 cm cook sheath was then placed over the wire into the IVC. The wire was removed, the sheath was flushed, and the sheath was secured at the IJ puncture site with sterile dressing. The patient was then flipped into a prone position on the IR table. The bilateral popliteal region were prepped and draped in the usual sterile fashion. Ultrasound survey of the  left popliteal region was performed with images stored and sent to PACs, confirming the target of the posterior tibial vein. Flow confirmed within the vein. A micropuncture needle was used access the left posterior tibial vein. With venous blood flow returned, and an .018 micro wire was passed through the needle, observed enter the femoral  vein under fluoroscopy. The needle was removed, and a micropuncture sheath was placed over the wire. The inner dilator and wire were removed, and an stiff Glidewire was advanced under fluoroscopy into the femoral vein. The sheath was removed and a standard 6 French sheath was placed. The dilator was removed and the sheath was flushed. Glidewire was then advanced using a angled crossing catheter into the iliac vein. Ultrasound survey of the right popliteal region was performed with images stored and sent to PACs, confirming the target of the popliteal vein. Flow confirmed within the vein. A micropuncture needle was used access the right popliteal vein. With venous blood flow returned, and an .018 micro wire was passed through the needle, observed enter the right femoral vein under fluoroscopy. The needle was removed, and a micropuncture sheath was placed over the wire. The inner dilator and wire were removed, and an stiff Glidewire was advanced under fluoroscopy into the femoral vein. The sheath was removed and a standard 6 French sheath was placed. The dilator was removed and the sheath was flushed. Glidewire was then advanced using a angled crossing catheter into the iliac vein. Crossing catheter and the Glidewire were used to navigate into the most inferior aspect of the 14 French IJ sheath. The wire was then externalized, and the 135 cm crossing catheter was externalized through the check flow valve. A stiff exchange length Amplatz wire was then placed through the crossing catheter as a flossing wire. Crossing catheter was removed. The sheath was then advanced over the wire below the tines of the IVC filter, and then a rendezvous was performed with the Glidewire from the right popliteal access using a angled catheter. With the crossing catheter within the sheath, the glide wires removed and 8 exchange length stiff Amplatz wire was placed into the 14 French sheath. Serial dilation of the bilateral popliteal access  then performed up to 16 [place]. Sixteen [place] proprietary Inari sheats were then placed in the bilateral popliteal access. We then initiated mechanical thrombectomy using the clot triever device from the right popliteal region. Fluoroscopic observation was performed, confirming that the device was initiated each time below the tines of the IVC filter. 4-5 passes of the device were performed before a angiogram was performed of the iliac and femoral vein through a coaxial angled catheter on the right. With resolution of the majority of thrombus on the right, we turned our attention to the left. Mechanical thrombectomy was performed on the left using the Clot Treever device. After 4-5 passes on the left side, a gentle angiogram was performed to the level of the iliac confluence. Once the clot within the bilateral iliac and femoral veins was resolved, we initiated therapy for the residual thrombus in the apex of the IVC. Twenty French dilation was performed of the right popliteal vein access. The clot tree vert device was then removed and the 20 French flow tree vert device was passed over the stiff wire into the apex of the filter. The wire was withdrawn and flow tree vert device was used for aspirating thrombus in the apex of the filter. We then passed angled catheters into the bilateral iliac veins through the  access for a final angiogram confirming restoration of flow through the bilateral femoral veins, iliac veins, and the IVC and IVC filter. We attempted a balloon angioplasty for mechanical disruption of residual thrombus in the lower femoral vein on the right. Sequential 8 mm in 12 m renal balloon angioplasty performed, with no waist identified and without resolution of the thrombus. We elected withdraw at this point. Pursestring sutures placed at the bilateral popliteal vein access with pressure dressings applied and manual pressure used for hemostasis. The patient was flipped into supine position and the  right IJ sheath was removed with manual pressure for hemostasis. The patient tolerated the procedure well and remained hemodynamically stable throughout. No complications were encountered and no significant blood loss. FINDINGS: Initial ultrasound images demonstrate patency of the right internal jugular vein, persistent thrombus of the bilateral popliteal veins. 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 After mechanical and aspiration thrombectomy there is restoration of flow through the entire bilateral iliofemoral and ilio caval segments through the filter. IMPRESSION: Status post ultrasound-guided access of right internal jugular vein and bilateral popliteal vein for mechanical/aspiration thrombectomy of extensive iliocaval and iliofemoral DVT, with restoration of flow from the bilateral popliteal veins through the IVC filter via Inari device. Signed, Dulcy Fanny. Dellia Nims, RPVI [facility] Inter[facility] Radiology Special[facility] Electronically Signed   By: Corrie Mckusick D.O.   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 She presents today for mechanical thrombectomy. EXAM: ULTRASOUND-GUIDED ACCESS RIGHT INTERNAL JUGULAR VEIN ULTRASOUND GUIDED ACCESS LEFT POPLITEAL VEIN ULTRASOUND GUIDED ACCESS RIGHT POPLITEAL VEIN MECHANICAL THROMBECTOMY/ASPIRATION THROMBECTOMY OF ACUTE THROMBUS OF IVC, BILATERAL ILIAC VEINS, BILATERAL FEMORAL VEINS COMPARISON:  CT IMAGING 05/12/2019 MEDICATIONS: 22000 units IV heparin ANESTHESIA/SEDATION: Versed 6.0 mg IV; Fentanyl 200 mcg IV Moderate Sedation Time:  211 The patient was continuously monitored during the procedure by the interventional radiology  nurse under my direct supervision. FLUOROSCOPY TIME:  Fluoroscopy Time: 20 minutes 6 seconds (321 mGy). COMPLICATIONS: None TECHNIQUE: Operators: Dr. Ronny Bacon Dr. Corrie Mckusick Informed written consent was obtained from the patient after a thorough discussion of the procedural risks, benefits and alternatives. All questions were addressed. Maximal Sterile Barrier Technique was utilized including caps, mask, sterile gowns, sterile gloves, sterile drape, hand hygiene and skin antiseptic. A timeout was performed prior to the initiation of the procedure. Patient is brought to the IR suite and placed on the table supine position. Her identity was confirmed. The right neck was prepped and draped in the usual sterile fashion. 1% lidocaine was used for local anesthesia. Ultrasound survey of the internal jugular vein was performed with images stored sent to PACs confirming patency. A micropuncture needle was used access the right internal jugular vein artery under ultrasound. With excellent venous blood flow returned, and an .018 micro wire was passed through the needle, observed to enter the superior vena cava. The needle was removed, and a micropuncture sheath was placed over the wire. The inner dilator and wire were removed, and an 035 Bentson wire was advanced under fluoroscopy into the IVC. With the wire in the IVC, a stab incision was made it the skin. Serial dilation 214 [place] was a treat. A 14 French 45 cm cook  sheath was then placed over the wire into the IVC. The wire was removed, the sheath was flushed, and the sheath was secured at the IJ puncture site with sterile dressing. The patient was then flipped into a prone position on the IR table. The bilateral popliteal region were prepped and draped in the usual sterile fashion. Ultrasound survey of the left popliteal region was performed with images stored and sent to PACs, confirming the target of the posterior tibial vein. Flow confirmed within the vein. A  micropuncture needle was used access the left posterior tibial vein. With venous blood flow returned, and an .018 micro wire was passed through the needle, observed enter the femoral vein under fluoroscopy. The needle was removed, and a micropuncture sheath was placed over the wire. The inner dilator and wire were removed, and an stiff Glidewire was advanced under fluoroscopy into the femoral vein. The sheath was removed and a standard 6 French sheath was placed. The dilator was removed and the sheath was flushed. Glidewire was then advanced using a angled crossing catheter into the iliac vein. Ultrasound survey of the right popliteal region was performed with images stored and sent to PACs, confirming the target of the popliteal vein. Flow confirmed within the vein. A micropuncture needle was used access the right popliteal vein. With venous blood flow returned, and an .018 micro wire was passed through the needle, observed enter the right femoral vein under fluoroscopy. The needle was removed, and a micropuncture sheath was placed over the wire. The inner dilator and wire were removed, and an stiff Glidewire was advanced under fluoroscopy into the femoral vein. The sheath was removed and a standard 6 French sheath was placed. The dilator was removed and the sheath was flushed. Glidewire was then advanced using a angled crossing catheter into the iliac vein. Crossing catheter and the Glidewire were used to navigate into the most inferior aspect of the 14 French IJ sheath. The wire was then externalized, and the 135 cm crossing catheter was externalized through the check flow valve. A stiff exchange length Amplatz wire was then placed through the crossing catheter as a flossing wire. Crossing catheter was removed. The sheath was then advanced over the wire below the tines of the IVC filter, and then a rendezvous was performed with the Glidewire from the right popliteal access using a angled catheter. With the  crossing catheter within the sheath, the glide wires removed and 8 exchange length stiff Amplatz wire was placed into the 14 French sheath. Serial dilation of the bilateral popliteal access then performed up to 16 [place]. Sixteen [place] proprietary Inari sheats were then placed in the bilateral popliteal access. We then initiated mechanical thrombectomy using the clot triever device from the right popliteal region. Fluoroscopic observation was performed, confirming that the device was initiated each time below the tines of the IVC filter. 4-5 passes of the device were performed before a angiogram was performed of the iliac and femoral vein through a coaxial angled catheter on the right. With resolution of the majority of thrombus on the right, we turned our attention to the left. Mechanical thrombectomy was performed on the left using the Clot Treever device. After 4-5 passes on the left side, a gentle angiogram was performed to the level of the iliac confluence. Once the clot within the bilateral iliac and femoral veins was resolved, we initiated therapy for the residual thrombus in the apex of the IVC. Twenty French dilation was performed of the right popliteal vein  access. The clot tree vert device was then removed and the 20 French flow tree vert device was passed over the stiff wire into the apex of the filter. The wire was withdrawn and flow tree vert device was used for aspirating thrombus in the apex of the filter. We then passed angled catheters into the bilateral iliac veins through the access for a final angiogram confirming restoration of flow through the bilateral femoral veins, iliac veins, and the IVC and IVC filter. We attempted a balloon angioplasty for mechanical disruption of residual thrombus in the lower femoral vein on the right. Sequential 8 mm in 12 m renal balloon angioplasty performed, with no waist identified and without resolution of the thrombus. We elected withdraw at this point.  Pursestring sutures placed at the bilateral popliteal vein access with pressure dressings applied and manual pressure used for hemostasis. The patient was flipped into supine position and the right IJ sheath was removed with manual pressure for hemostasis. The patient tolerated the procedure well and remained hemodynamically stable throughout. No complications were encountered and no significant blood loss. FINDINGS: Initial ultrasound images demonstrate patency of the right internal jugular vein, persistent thrombus of the bilateral popliteal veins. 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 After mechanical and aspiration thrombectomy there is restoration of flow through the entire bilateral iliofemoral and ilio caval segments through the filter. IMPRESSION: Status post ultrasound-guided access of right internal jugular vein and bilateral popliteal vein for mechanical/aspiration thrombectomy of extensive iliocaval and iliofemoral DVT, with restoration of flow from the bilateral popliteal veins through the IVC filter via Inari device. Signed, Yvone Neu. Reyne Dumas, RPVI [facility] Radiology Electronically Signed   By: Gilmer Mor D.O.   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 She presents today for mechanical thrombectomy. EXAM: ULTRASOUND-GUIDED ACCESS RIGHT INTERNAL JUGULAR VEIN ULTRASOUND GUIDED ACCESS LEFT POPLITEAL VEIN ULTRASOUND GUIDED ACCESS RIGHT POPLITEAL VEIN MECHANICAL THROMBECTOMY/ASPIRATION THROMBECTOMY OF ACUTE THROMBUS OF IVC, BILATERAL ILIAC VEINS, BILATERAL FEMORAL VEINS COMPARISON:  CT IMAGING 05/12/2019 MEDICATIONS: 22000 units  IV heparin ANESTHESIA/SEDATION: Versed 6.0 mg IV; Fentanyl 200 mcg IV Moderate Sedation Time:  211 The patient was continuously monitored during the procedure by the interventional radiology nurse under my direct supervision. FLUOROSCOPY TIME:  Fluoroscopy Time: 20 minutes 6 seconds (321 mGy). COMPLICATIONS: None TECHNIQUE: Operators: Dr. Katherina Right Dr. Gilmer Mor Informed written consent was obtained from the patient after a thorough discussion of the procedural risks, benefits and alternatives. All questions were addressed. Maximal Sterile Barrier Technique was utilized including caps, mask, sterile gowns, sterile gloves, sterile drape, hand hygiene and skin antiseptic. A timeout was performed prior to the initiation of the procedure. Patient is brought to the IR suite and placed on the table supine position. Her identity was confirmed. The right neck was prepped and draped in the usual sterile fashion. 1% lidocaine was used for local anesthesia. Ultrasound survey of the internal jugular vein was performed with images stored sent to PACs confirming patency. A micropuncture needle was used access the right internal jugular vein artery under ultrasound. With excellent venous blood flow returned, and an .018 micro wire was passed through the needle, observed to enter the superior  vena cava. The needle was removed, and a micropuncture sheath was placed over the wire. The inner dilator and wire were removed, and an 035 Bentson wire was advanced under fluoroscopy into the IVC. With the wire in the IVC, a stab incision was made it the skin. Serial dilation 214 [place] was a treat. A 14 French 45 cm cook sheath was then placed over the wire into the IVC. The wire was removed, the sheath was flushed, and the sheath was secured at the IJ puncture site with sterile dressing. The patient was then flipped into a prone position on the IR table. The bilateral popliteal region were prepped and draped in the usual sterile fashion.  Ultrasound survey of the left popliteal region was performed with images stored and sent to PACs, confirming the target of the posterior tibial vein. Flow confirmed within the vein. A micropuncture needle was used access the left posterior tibial vein. With venous blood flow returned, and an .018 micro wire was passed through the needle, observed enter the femoral vein under fluoroscopy. The needle was removed, and a micropuncture sheath was placed over the wire. The inner dilator and wire were removed, and an stiff Glidewire was advanced under fluoroscopy into the femoral vein. The sheath was removed and a standard 6 French sheath was placed. The dilator was removed and the sheath was flushed. Glidewire was then advanced using a angled crossing catheter into the iliac vein. Ultrasound survey of the right popliteal region was performed with images stored and sent to PACs, confirming the target of the popliteal vein. Flow confirmed within the vein. A micropuncture needle was used access the right popliteal vein. With venous blood flow returned, and an .018 micro wire was passed through the needle, observed enter the right femoral vein under fluoroscopy. The needle was removed, and a micropuncture sheath was placed over the wire. The inner dilator and wire were removed, and an stiff Glidewire was advanced under fluoroscopy into the femoral vein. The sheath was removed and a standard 6 French sheath was placed. The dilator was removed and the sheath was flushed. Glidewire was then advanced using a angled crossing catheter into the iliac vein. Crossing catheter and the Glidewire were used to navigate into the most inferior aspect of the 14 French IJ sheath. The wire was then externalized, and the 135 cm crossing catheter was externalized through the check flow valve. A stiff exchange length Amplatz wire was then placed through the crossing catheter as a flossing wire. Crossing catheter was removed. The sheath was then  advanced over the wire below the tines of the IVC filter, and then a rendezvous was performed with the Glidewire from the right popliteal access using a angled catheter. With the crossing catheter within the sheath, the glide wires removed and 8 exchange length stiff Amplatz wire was placed into the 14 French sheath. Serial dilation of the bilateral popliteal access then performed up to 16 [place]. Sixteen [place] proprietary Inari sheats were then placed in the bilateral popliteal access. We then initiated mechanical thrombectomy using the clot triever device from the right popliteal region. Fluoroscopic observation was performed, confirming that the device was initiated each time below the tines of the IVC filter. 4-5 passes of the device were performed before a angiogram was performed of the iliac and femoral vein through a coaxial angled catheter on the right. With resolution of the majority of thrombus on the right, we turned our attention to the left. Mechanical thrombectomy was performed on  the left using the Clot Treever device. After 4-5 passes on the left side, a gentle angiogram was performed to the level of the iliac confluence. Once the clot within the bilateral iliac and femoral veins was resolved, we initiated therapy for the residual thrombus in the apex of the IVC. Twenty French dilation was performed of the right popliteal vein access. The clot tree vert device was then removed and the 20 French flow tree vert device was passed over the stiff wire into the apex of the filter. The wire was withdrawn and flow tree vert device was used for aspirating thrombus in the apex of the filter. We then passed angled catheters into the bilateral iliac veins through the access for a final angiogram confirming restoration of flow through the bilateral femoral veins, iliac veins, and the IVC and IVC filter. We attempted a balloon angioplasty for mechanical disruption of residual thrombus in the lower femoral vein on  the right. Sequential 8 mm in 12 m renal balloon angioplasty performed, with no waist identified and without resolution of the thrombus. We elected withdraw at this point. Pursestring sutures placed at the bilateral popliteal vein access with pressure dressings applied and manual pressure used for hemostasis. The patient was flipped into supine position and the right IJ sheath was removed with manual pressure for hemostasis. The patient tolerated the procedure well and remained hemodynamically stable throughout. No complications were encountered and no significant blood loss. FINDINGS: Initial ultrasound images demonstrate patency of the right internal jugular vein, persistent thrombus of the bilateral popliteal veins. 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 After mechanical and aspiration thrombectomy there is restoration of flow through the entire bilateral iliofemoral and ilio caval segments through the filter. IMPRESSION: Status post ultrasound-guided access of right internal jugular vein and bilateral popliteal vein for mechanical/aspiration thrombectomy of extensive iliocaval and iliofemoral DVT, with restoration of flow from the bilateral popliteal veins through the IVC filter via Inari device. Signed, Yvone Neu. Reyne Dumas, RPVI Vascular and Interventional Radiology Specialists [facility] Radiology Electronically Signed   By: Gilmer Mor D.O.   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 She presents today for mechanical thrombectomy. EXAM: ULTRASOUND-GUIDED ACCESS RIGHT INTERNAL JUGULAR VEIN ULTRASOUND GUIDED ACCESS LEFT POPLITEAL VEIN ULTRASOUND GUIDED ACCESS RIGHT POPLITEAL VEIN MECHANICAL  THROMBECTOMY/ASPIRATION THROMBECTOMY OF ACUTE THROMBUS OF IVC, BILATERAL ILIAC VEINS, BILATERAL FEMORAL VEINS COMPARISON:  CT IMAGING 05/12/2019 MEDICATIONS: 22000 units IV heparin ANESTHESIA/SEDATION: Versed 6.0 mg IV; Fentanyl 200 mcg IV Moderate Sedation Time:  211 The patient was continuously monitored during the procedure by the interventional radiology nurse under my direct supervision. FLUOROSCOPY TIME:  Fluoroscopy Time: 20 minutes 6 seconds (321 mGy). COMPLICATIONS: None TECHNIQUE: Operators: Dr. Katherina Right Dr. Gilmer Mor Informed written consent was obtained from the patient after a thorough discussion of the procedural risks, benefits and alternatives. All questions were addressed. Maximal Sterile Barrier Technique was utilized including caps, mask, sterile gowns, sterile gloves, sterile drape, hand hygiene and skin antiseptic. A timeout was performed prior to the initiation of the procedure. Patient is brought to the IR suite and placed on the table supine position. Her identity was confirmed. The right neck was prepped and draped in the usual  sterile fashion. 1% lidocaine was used for local anesthesia. Ultrasound survey of the internal jugular vein was performed with images stored sent to PACs confirming patency. A micropuncture needle was used access the right internal jugular vein artery under ultrasound. With excellent venous blood flow returned, and an .018 micro wire was passed through the needle, observed to enter the superior vena cava. The needle was removed, and a micropuncture sheath was placed over the wire. The inner dilator and wire were removed, and an 035 Bentson wire was advanced under fluoroscopy into the IVC. With the wire in the IVC, a stab incision was made it the skin. Serial dilation 214 [place] was a treat. A 14 French 45 cm cook sheath was then placed over the wire into the IVC. The wire was removed, the sheath was flushed, and the sheath was secured at the IJ puncture site with  sterile dressing. The patient was then flipped into a prone position on the IR table. The bilateral popliteal region were prepped and draped in the usual sterile fashion. Ultrasound survey of the left popliteal region was performed with images stored and sent to PACs, confirming the target of the posterior tibial vein. Flow confirmed within the vein. A micropuncture needle was used access the left posterior tibial vein. With venous blood flow returned, and an .018 micro wire was passed through the needle, observed enter the femoral vein under fluoroscopy. The needle was removed, and a micropuncture sheath was placed over the wire. The inner dilator and wire were removed, and an stiff Glidewire was advanced under fluoroscopy into the femoral vein. The sheath was removed and a standard 6 French sheath was placed. The dilator was removed and the sheath was flushed. Glidewire was then advanced using a angled crossing catheter into the iliac vein. Ultrasound survey of the right popliteal region was performed with images stored and sent to PACs, confirming the target of the popliteal vein. Flow confirmed within the vein. A micropuncture needle was used access the right popliteal vein. With venous blood flow returned, and an .018 micro wire was passed through the needle, observed enter the right femoral vein under fluoroscopy. The needle was removed, and a micropuncture sheath was placed over the wire. The inner dilator and wire were removed, and an stiff Glidewire was advanced under fluoroscopy into the femoral vein. The sheath was removed and a standard 6 French sheath was placed. The dilator was removed and the sheath was flushed. Glidewire was then advanced using a angled crossing catheter into the iliac vein. Crossing catheter and the Glidewire were used to navigate into the most inferior aspect of the 14 French IJ sheath. The wire was then externalized, and the 135 cm crossing catheter was externalized through the  check flow valve. A stiff exchange length Amplatz wire was then placed through the crossing catheter as a flossing wire. Crossing catheter was removed. The sheath was then advanced over the wire below the tines of the IVC filter, and then a rendezvous was performed with the Glidewire from the right popliteal access using a angled catheter. With the crossing catheter within the sheath, the glide wires removed and 8 exchange length stiff Amplatz wire was placed into the 14 French sheath. Serial dilation of the bilateral popliteal access then performed up to 16 [place]. Sixteen [place] proprietary Inari sheats were then placed in the bilateral popliteal access. We then initiated mechanical thrombectomy using the clot triever device from the right popliteal region. Fluoroscopic observation was performed, confirming that  the device was initiated each time below the tines of the IVC filter. 4-5 passes of the device were performed before a angiogram was performed of the iliac and femoral vein through a coaxial angled catheter on the right. With resolution of the majority of thrombus on the right, we turned our attention to the left. Mechanical thrombectomy was performed on the left using the Clot Treever device. After 4-5 passes on the left side, a gentle angiogram was performed to the level of the iliac confluence. Once the clot within the bilateral iliac and femoral veins was resolved, we initiated therapy for the residual thrombus in the apex of the IVC. Twenty French dilation was performed of the right popliteal vein access. The clot tree vert device was then removed and the 20 French flow tree vert device was passed over the stiff wire into the apex of the filter. The wire was withdrawn and flow tree vert device was used for aspirating thrombus in the apex of the filter. We then passed angled catheters into the bilateral iliac veins through the access for a final angiogram confirming restoration of flow through the  bilateral femoral veins, iliac veins, and the IVC and IVC filter. We attempted a balloon angioplasty for mechanical disruption of residual thrombus in the lower femoral vein on the right. Sequential 8 mm in 12 m renal balloon angioplasty performed, with no waist identified and without resolution of the thrombus. We elected withdraw at this point. Pursestring sutures placed at the bilateral popliteal vein access with pressure dressings applied and manual pressure used for hemostasis. The patient was flipped into supine position and the right IJ sheath was removed with manual pressure for hemostasis. The patient tolerated the procedure well and remained hemodynamically stable throughout. No complications were encountered and no significant blood loss. FINDINGS: Initial ultrasound images demonstrate patency of the right internal jugular vein, persistent thrombus of the bilateral popliteal veins. 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 After mechanical and aspiration thrombectomy there is restoration of flow through the entire bilateral iliofemoral and ilio caval segments through the filter. IMPRESSION: Status post ultrasound-guided access of right internal jugular vein and bilateral popliteal vein for mechanical/aspiration thrombectomy of extensive iliocaval and iliofemoral DVT, with restoration of flow from the bilateral popliteal veins through the IVC filter via Inari device. Signed, Yvone Neu. Reyne Dumas, RPVI Vascular and Interventional Radiology Specialists [facility] Radiology Electronically Signed   By: Gilmer Mor D.O.   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 She presents today for  mechanical thrombectomy. EXAM: ULTRASOUND-GUIDED ACCESS RIGHT INTERNAL JUGULAR VEIN ULTRASOUND GUIDED ACCESS LEFT POPLITEAL VEIN ULTRASOUND GUIDED ACCESS RIGHT POPLITEAL VEIN MECHANICAL THROMBECTOMY/ASPIRATION THROMBECTOMY OF ACUTE THROMBUS OF IVC, BILATERAL ILIAC VEINS, BILATERAL FEMORAL VEINS COMPARISON:  CT IMAGING 05/12/2019 MEDICATIONS: 22000 units IV heparin ANESTHESIA/SEDATION: Versed 6.0 mg IV; Fentanyl 200 mcg IV Moderate Sedation Time:  211 The patient was continuously monitored during the procedure by the interventional radiology nurse under my direct supervision. FLUOROSCOPY TIME:  Fluoroscopy Time: 20 minutes 6 seconds (321 mGy). COMPLICATIONS: None TECHNIQUE: Operators: Dr. Katherina Right Dr. Gilmer Mor Informed written consent was obtained from the patient after a thorough discussion of the procedural risks, benefits and alternatives. All questions  were addressed. Maximal Sterile Barrier Technique was utilized including caps, mask, sterile gowns, sterile gloves, sterile drape, hand hygiene and skin antiseptic. A timeout was performed prior to the initiation of the procedure. Patient is brought to the IR suite and placed on the table supine position. Her identity was confirmed. The right neck was prepped and draped in the usual sterile fashion. 1% lidocaine was used for local anesthesia. Ultrasound survey of the internal jugular vein was performed with images stored sent to PACs confirming patency. A micropuncture needle was used access the right internal jugular vein artery under ultrasound. With excellent venous blood flow returned, and an .018 micro wire was passed through the needle, observed to enter the superior vena cava. The needle was removed, and a micropuncture sheath was placed over the wire. The inner dilator and wire were removed, and an 035 Bentson wire was advanced under fluoroscopy into the IVC. With the wire in the IVC, a stab incision was made it the skin. Serial dilation 214 [place] was  a treat. A 14 French 45 cm cook sheath was then placed over the wire into the IVC. The wire was removed, the sheath was flushed, and the sheath was secured at the IJ puncture site with sterile dressing. The patient was then flipped into a prone position on the IR table. The bilateral popliteal region were prepped and draped in the usual sterile fashion. Ultrasound survey of the left popliteal region was performed with images stored and sent to PACs, confirming the target of the posterior tibial vein. Flow confirmed within the vein. A micropuncture needle was used access the left posterior tibial vein. With venous blood flow returned, and an .018 micro wire was passed through the needle, observed enter the femoral vein under fluoroscopy. The needle was removed, and a micropuncture sheath was placed over the wire. The inner dilator and wire were removed, and an stiff Glidewire was advanced under fluoroscopy into the femoral vein. The sheath was removed and a standard 6 French sheath was placed. The dilator was removed and the sheath was flushed. Glidewire was then advanced using a angled crossing catheter into the iliac vein. Ultrasound survey of the right popliteal region was performed with images stored and sent to PACs, confirming the target of the popliteal vein. Flow confirmed within the vein. A micropuncture needle was used access the right popliteal vein. With venous blood flow returned, and an .018 micro wire was passed through the needle, observed enter the right femoral vein under fluoroscopy. The needle was removed, and a micropuncture sheath was placed over the wire. The inner dilator and wire were removed, and an stiff Glidewire was advanced under fluoroscopy into the femoral vein. The sheath was removed and a standard 6 French sheath was placed. The dilator was removed and the sheath was flushed. Glidewire was then advanced using a angled crossing catheter into the iliac vein. Crossing catheter and the  Glidewire were used to navigate into the most inferior aspect of the 14 French IJ sheath. The wire was then externalized, and the 135 cm crossing catheter was externalized through the check flow valve. A stiff exchange length Amplatz wire was then placed through the crossing catheter as a flossing wire. Crossing catheter was removed. The sheath was then advanced over the wire below the tines of the IVC filter, and then a rendezvous was performed with the Glidewire from the right popliteal access using a angled catheter. With the crossing catheter within the sheath, the glide wires removed  and 8 exchange length stiff Amplatz wire was placed into the 14 French sheath. Serial dilation of the bilateral popliteal access then performed up to 16 [place]. Sixteen [place] proprietary Inari sheats were then placed in the bilateral popliteal access. We then initiated mechanical thrombectomy using the clot triever device from the right popliteal region. Fluoroscopic observation was performed, confirming that the device was initiated each time below the tines of the IVC filter. 4-5 passes of the device were performed before a angiogram was performed of the iliac and femoral vein through a coaxial angled catheter on the right. With resolution of the majority of thrombus on the right, we turned our attention to the left. Mechanical thrombectomy was performed on the left using the Clot Treever device. After 4-5 passes on the left side, a gentle angiogram was performed to the level of the iliac confluence. Once the clot within the bilateral iliac and femoral veins was resolved, we initiated therapy for the residual thrombus in the apex of the IVC. Twenty French dilation was performed of the right popliteal vein access. The clot tree vert device was then removed and the 20 French flow tree vert device was passed over the stiff wire into the apex of the filter. The wire was withdrawn and flow tree vert device was used for aspirating  thrombus in the apex of the filter. We then passed angled catheters into the bilateral iliac veins through the access for a final angiogram confirming restoration of flow through the bilateral femoral veins, iliac veins, and the IVC and IVC filter. We attempted a balloon angioplasty for mechanical disruption of residual thrombus in the lower femoral vein on the right. Sequential 8 mm in 12 m renal balloon angioplasty performed, with no waist identified and without resolution of the thrombus. We elected withdraw at this point. Pursestring sutures placed at the bilateral popliteal vein access with pressure dressings applied and manual pressure used for hemostasis. The patient was flipped into supine position and the right IJ sheath was removed with manual pressure for hemostasis. The patient tolerated the procedure well and remained hemodynamically stable throughout. No complications were encountered and no significant blood loss. FINDINGS: Initial ultrasound images demonstrate patency of the right internal jugular vein, persistent thrombus of the bilateral popliteal veins. 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 After mechanical and aspiration thrombectomy there is restoration of flow through the entire bilateral iliofemoral and ilio caval segments through the filter. IMPRESSION: Status post ultrasound-guided access of right internal jugular vein and bilateral popliteal vein for mechanical/aspiration thrombectomy of extensive iliocaval and iliofemoral DVT, with restoration of flow from the bilateral popliteal veins through the IVC filter via Inari device. Signed, Yvone Neu. Reyne Dumas, RPVI [facility] Radiology Electronically Signed   By: Gilmer Mor D.O.   On: 05/16/2019 17:51    Scheduled Meds: . ascorbic acid  500 mg Oral Daily  . cholecalciferol   1,000 Units Oral Daily  . dextromethorphan-guaiFENesin  1 tablet Oral BID  . feeding supplement (GLUCERNA SHAKE)  237 mL Oral TID BM  . insulin aspart  0-15 Units Subcutaneous TID WC  . insulin aspart  0-5 Units Subcutaneous QHS  . insulin glargine  10 Units Subcutaneous Daily  . loratadine  10 mg Oral Daily  . pantoprazole  40 mg Oral Daily  . polyethylene glycol  17 g Oral BID  . senna-docusate  2 tablet  Oral BID  . umeclidinium bromide  1 puff Inhalation Daily  . zinc sulfate  220 mg Oral Daily    Continuous Infusions: . sodium chloride Stopped (05/16/19 0022)  . sodium chloride 125 mL/hr at 05/17/19 0147  . ceFEPime (MAXIPIME) IV 2 g (05/17/19 0228)  . heparin 1,400 Units/hr (05/17/19 0405)     Joycelyn Das, MD  [facility] 05/17/2019

## 2019-05-18 LAB — BPAM RBC
Blood Product Expiration Date: 202101292359
ISSUE DATE / TIME: 202101010428
Unit Type and Rh: 5100

## 2019-05-18 LAB — CBC
HCT: 24.8 % — ABNORMAL LOW (ref 36.0–46.0)
Hemoglobin: 7.8 g/dL — ABNORMAL LOW (ref 12.0–15.0)
MCH: 27.5 pg (ref 26.0–34.0)
MCHC: 31.5 g/dL (ref 30.0–36.0)
MCV: 87.3 fL (ref 80.0–100.0)
Platelets: 173 10*3/uL (ref 150–400)
RBC: 2.84 MIL/uL — ABNORMAL LOW (ref 3.87–5.11)
RDW: 20.2 % — ABNORMAL HIGH (ref 11.5–15.5)
WBC: 12.1 10*3/uL — ABNORMAL HIGH (ref 4.0–10.5)
nRBC: 0.3 % — ABNORMAL HIGH (ref 0.0–0.2)

## 2019-05-18 LAB — BASIC METABOLIC PANEL
Anion gap: 8 (ref 5–15)
BUN: 13 mg/dL (ref 8–23)
CO2: 16 mmol/L — ABNORMAL LOW (ref 22–32)
Calcium: 8 mg/dL — ABNORMAL LOW (ref 8.9–10.3)
Chloride: 110 mmol/L (ref 98–111)
Creatinine, Ser: 0.76 mg/dL (ref 0.44–1.00)
GFR calc Af Amer: >60 mL/min (ref >60)
GFR calc non Af Amer: >60 mL/min (ref >60)
Glucose, Bld: 194 mg/dL — ABNORMAL HIGH (ref 70–99)
Potassium: 3.8 mmol/L (ref 3.5–5.1)
Sodium: 134 mmol/L — ABNORMAL LOW (ref 135–145)

## 2019-05-18 LAB — TYPE AND SCREEN
ABO/RH(D): O POS
Antibody Screen: NEGATIVE
Unit division: 0

## 2019-05-18 LAB — HEPARIN LEVEL (UNFRACTIONATED)
Heparin Unfractionated: 0.27 IU/mL — ABNORMAL LOW (ref 0.30–0.70)
Heparin Unfractionated: 0.32 IU/mL (ref 0.30–0.70)

## 2019-05-18 LAB — GLUCOSE, CAPILLARY
Glucose-Capillary: 153 mg/dL — ABNORMAL HIGH (ref 70–99)
Glucose-Capillary: 146 mg/dL — ABNORMAL HIGH (ref 70–99)
Glucose-Capillary: 177 mg/dL — ABNORMAL HIGH (ref 70–99)
Glucose-Capillary: 155 mg/dL — ABNORMAL HIGH (ref 70–99)
Glucose-Capillary: 157 mg/dL — ABNORMAL HIGH (ref 70–99)

## 2019-05-18 LAB — MAGNESIUM: Magnesium: 1.8 mg/dL (ref 1.7–2.4)

## 2019-05-18 NOTE — Progress Notes (Signed)
ANTICOAGULATION CONSULT NOTE - Follow Up Consult  Pharmacy Consult for Heparin  Indication: BL PE with R heart strain and LLE DVT   No Known Allergies  Patient Measurements: Height: 5\' 5"  (165.1 cm) Weight: 235 lb (106.6 kg) IBW/kg (Calculated) : 57 Heparin Dosing Weight: 81.8 kg  Vital Signs: Temp: 98.3 F (36.8 C) (01/02 1141) Temp Source: Oral (01/02 1141) BP: 106/49 (01/02 1158) Pulse Rate: 90 (01/02 1141)  Labs: Recent Labs    05/16/19 0230 05/17/19 0221 05/17/19 0841 05/17/19 1404 05/18/19 0249 05/18/19 1556  HGB 9.7* 7.4* 8.3*  --  7.8*  --   HCT 30.2* 24.2* 26.8*  --  24.8*  --   PLT 192 180 185  --  173  --   LABPROT 13.5  --   --   --   --   --   INR 1.0  --   --   --   --   --   HEPARINUNFRC 0.48 0.90* 0.63 0.29* 0.27* 0.32  CREATININE 0.85  --   --   --  0.76  --     Estimated Creatinine Clearance: 82.7 mL/min (by C-G formula based on SCr of 0.76 mg/dL).  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 No anticoagulation PTA. Patient's hemoglobin has been steadily decreasing requiring transfusions. Per hem/onc MD continue heparin infusion even with hemoglobin downward trend. Patient has a hx of recent pelvic hematoma and is s/p thrombectomy on 12/21.   Heparin level is now therapeutic after rate increase. No bleeding noted.   Goal of Therapy:  Heparin level 0.3-0.5 units/ml Monitor platelets by anticoagulation protocol: Yes   Plan:  Continue heparin 1550 units/hr Daily heparin level and CBC  1/22, PharmD, BCPS Clinical Pharmacist Please see AMION for all pharmacy numbers 05/18/2019 5:27 PM

## 2019-05-18 NOTE — Progress Notes (Addendum)
ANTICOAGULATION CONSULT NOTE - Follow Up Consult  Pharmacy Consult for Heparin  Indication: BL PE with R heart strain and LLE DVT   No Known Allergies  Patient Measurements: Height: 5\' 5"  (165.1 cm) Weight: 235 lb (106.6 kg) IBW/kg (Calculated) : 57 Heparin Dosing Weight: 81.8 kg  Vital Signs: Temp: 98 F (36.7 C) (01/02 0335) Temp Source: Oral (01/02 0335) BP: 101/48 (01/02 0335) Pulse Rate: 98 (01/02 0335)  Labs: Recent Labs    05/16/19 0230 05/17/19 0221 05/17/19 0841 05/17/19 1404 05/18/19 0249  HGB 9.7* 7.4* 8.3*  --  7.8*  HCT 30.2* 24.2* 26.8*  --  24.8*  PLT 192 180 185  --  173  LABPROT 13.5  --   --   --   --   INR 1.0  --   --   --   --   HEPARINUNFRC 0.48 0.90* 0.63 0.29* 0.27*  CREATININE 0.85  --   --   --  0.76    Estimated Creatinine Clearance: 82.7 mL/min (by C-G formula based on SCr of 0.76 mg/dL).  Assessment: 69 yof with submassive BL PE with R heart strain and LLE DVT (hx of COVID 03/2019), had IVC filter placed 12/14. No anticoagulation PTA. Patient's hemoglobin has been steadily decreasing requiring transfusions. Per hem/onc MD continue heparin infusion even with hemoglobin downward trend. Patient has a hx of recent pelvic hematoma and is s/p thrombectomy on 12/21.   Heparin level was slightly subtherapeutic at 0.27 this morning on heparin rate of 1500. Level drawn appropriately and no signs/symptoms of bleeding or issues for infusion. Hg continues to drop from 8.3 to 7.8. Plts are wnls  Goal of Therapy:  Heparin level 0.3-0.5 units/ml Monitor platelets by anticoagulation protocol: Yes   Plan:  No bolus due to low Hgb levels and hx of bleeding Increase heparin infusion to 1550 units/hour  Obtain 6 hour heparin level Heparin level and CBC daily Monitor for signs of bleeding  Follow up for transition to oral anticoagulation   Thank you,   1/22, PharmD PGY1 Acute Care Pharmacy Resident Please check amion for clinical pharmacist  contact number

## 2019-05-18 NOTE — Progress Notes (Signed)
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 Earleen Newport and Dr. Pascal Lux. Patient awake and alert sitting in bed. Accompanied by husband at bedside. States calf pain/tightness has improved since yesterday. Bilateral popliteal and right IJ incision sites c/d/i.   Allergies: Patient has no known allergies.  Medications: Prior to Admission medications   Medication Sig Start Date End Date Taking? Authorizing Provider  acetaminophen (TYLENOL) 500 MG tablet Take 1,000 mg by mouth every 6 (six) hours as needed for mild pain or moderate pain.    Yes [provider]  albuterol (VENTOLIN HFA) 108 (90 Base) MCG/ACT inhaler Inhale 2 puffs into the lungs every 4 (four) hours as needed for wheezing or shortness of breath. 05/02/19  Yes Regalado, Belkys A, MD  ascorbic acid (VITAMIN C) 500 MG tablet Take 1 tablet (500 mg total) by mouth daily. 05/02/19  Yes Regalado, Belkys A, MD  blood glucose meter kit and supplies KIT Dispense based on patient and insurance preference. Use up to four times daily as directed. (FOR ICD-9 250.00, 250.01). 05/02/19  Yes Regalado, Belkys A, MD  buPROPion (ZYBAN) 150 MG 12 hr tablet Take 75 mg by mouth every morning.  03/28/19  Yes [provider]  cetirizine (ZYRTEC) 10 MG tablet Take 10 mg by mouth daily. 02/13/19  Yes [provider]  cholecalciferol (VITAMIN D) 1000 UNITS tablet Take 1,000 Units by mouth daily.   Yes [provider]  dextromethorphan-guaiFENesin (MUCINEX DM) 30-600 MG 12hr tablet Take 1 tablet by mouth 2 (two) times daily. 05/02/19  Yes Regalado, Belkys A, MD  diclofenac Sodium (VOLTAREN) 1 % GEL Apply 2 g topically daily as needed (for pain).    Yes  [provider]  Insulin Glargine (LANTUS) 100 UNIT/ML Solostar Pen Inject 10 Units into the skin daily. 05/02/19  Yes Regalado, Belkys A, MD  Insulin Pen Needle (PEN NEEDLES 3/16") 31G X 5 MM MISC 1 application by Does not apply route daily. 05/02/19  Yes Regalado, Belkys A, MD  metFORMIN (GLUCOPHAGE) 500 MG tablet Take 500 mg by mouth 2 (two) times daily with a meal.    Yes [provider]  omeprazole (PRILOSEC) 20 MG capsule Take 20 mg by mouth daily.   Yes [provider]  polyethylene glycol (MIRALAX / GLYCOLAX) 17 g packet Take 17 g by mouth daily as needed for mild constipation. 05/02/19  Yes Regalado, Belkys A, MD  tiotropium (SPIRIVA HANDIHALER) 18 MCG inhalation capsule Place 1 capsule (18 mcg total) into inhaler and inhale 2 (two) times daily. 05/02/19  Yes Regalado, Belkys A, MD  zinc sulfate 220 (50 Zn) MG capsule Take 1 capsule (220 mg total) by mouth daily. 05/02/19  Yes Regalado, Belkys A, MD     Vital Signs: BP (!) 106/49    Pulse 90    Temp 98.3 F (36.8 C) (Oral)    Resp (!) 25    Ht 5' 5"  (1.651 m)    Wt 235 lb (106.6 kg)    SpO2 96%    BMI 39.11 kg/m   Physical Exam Vitals and nursing note reviewed.  Constitutional:      General: She is not in acute distress.    Appearance: Normal appearance.  Pulmonary:     Effort: Pulmonary effort is  normal. No respiratory distress.  Musculoskeletal:     Comments: Bilateral popliteal sites soft without active bleeding or hematoma. Bilateral LE with 2+ pitting edema up to knees.  Bilateral calves are tender to palpation. No tenderness noted to bilateral thighs, ankles or feet.    Skin:    General: Skin is warm and dry.     Comments: Right IJ site soft without active bleeding or hematoma.  Neurological:     Mental Status: She is alert and oriented to person, place, and time.  Psychiatric:        Mood and Affect: Mood normal.        Behavior: Behavior normal.     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 She presents today for mechanical thrombectomy. EXAM: ULTRASOUND-GUIDED ACCESS RIGHT INTERNAL JUGULAR VEIN ULTRASOUND GUIDED ACCESS LEFT POPLITEAL VEIN ULTRASOUND GUIDED ACCESS RIGHT POPLITEAL VEIN MECHANICAL THROMBECTOMY/ASPIRATION THROMBECTOMY OF ACUTE THROMBUS OF IVC, BILATERAL ILIAC VEINS, BILATERAL FEMORAL VEINS COMPARISON:  CT IMAGING 05/12/2019 MEDICATIONS: 22000 units IV heparin ANESTHESIA/SEDATION: Versed 6.0 mg IV; Fentanyl 200 mcg IV Moderate Sedation Time:  211 The patient was continuously monitored during the procedure by the interventional radiology nurse under my direct supervision. FLUOROSCOPY TIME:  Fluoroscopy Time: 20 minutes 6 seconds (321 mGy). COMPLICATIONS: None TECHNIQUE: Operators: Dr. Ronny Bacon Dr. Corrie Mckusick Informed written consent was obtained from the patient after a thorough discussion of the procedural risks, benefits and alternatives. All questions were addressed. Maximal Sterile Barrier Technique was utilized including caps, mask, sterile gowns, sterile gloves, sterile drape, hand hygiene and skin antiseptic. A timeout was performed prior to the initiation of the procedure. Patient is brought to the IR suite and placed on the table supine position. Her identity was confirmed. The right neck was prepped and draped in the usual sterile fashion. 1% lidocaine was used for local anesthesia. Ultrasound survey of the internal jugular vein was performed with images stored sent to PACs confirming patency. A micropuncture needle was used access the right internal jugular vein artery under ultrasound. With excellent venous blood flow returned, and an .018 micro wire was passed through the needle, observed to enter the superior vena cava. The needle was removed, and a micropuncture sheath was placed over  the wire. The inner dilator and wire were removed, and an 035 Bentson wire was advanced under fluoroscopy into the IVC. With the wire in the IVC, a stab incision was made it the skin. Serial dilation 214 [place] was a treat. A 14 French 45 cm cook sheath was then placed over the wire into the IVC. The wire was removed, the sheath was flushed, and the sheath was secured at the IJ puncture site with sterile dressing. The patient was then flipped into a prone position on the IR table. The bilateral popliteal region were prepped and draped in the usual sterile fashion. Ultrasound survey of the left popliteal region was performed with images stored and sent to PACs, confirming the target of the posterior tibial vein. Flow confirmed within the vein. A micropuncture needle was used access the left posterior tibial vein. With venous blood flow returned, and an .018 micro wire was passed through the needle, observed enter the femoral vein under fluoroscopy. The needle was removed, and a micropuncture sheath was placed over the wire. The inner dilator and wire were removed, and an  stiff Glidewire was advanced under fluoroscopy into the femoral vein. The sheath was removed and a standard 6 French sheath was placed. The dilator was removed and the sheath was flushed. Glidewire was then advanced using a angled crossing catheter into the iliac vein. Ultrasound survey of the right popliteal region was performed with images stored and sent to PACs, confirming the target of the popliteal vein. Flow confirmed within the vein. A micropuncture needle was used access the right popliteal vein. With venous blood flow returned, and an .018 micro wire was passed through the needle, observed enter the right femoral vein under fluoroscopy. The needle was removed, and a micropuncture sheath was placed over the wire. The inner dilator and wire were removed, and an stiff Glidewire was advanced under fluoroscopy into the femoral vein. The sheath  was removed and a standard 6 French sheath was placed. The dilator was removed and the sheath was flushed. Glidewire was then advanced using a angled crossing catheter into the iliac vein. Crossing catheter and the Glidewire were used to navigate into the most inferior aspect of the 14 French IJ sheath. The wire was then externalized, and the 135 cm crossing catheter was externalized through the check flow valve. A stiff exchange length Amplatz wire was then placed through the crossing catheter as a flossing wire. Crossing catheter was removed. The sheath was then advanced over the wire below the tines of the IVC filter, and then a rendezvous was performed with the Glidewire from the right popliteal access using a angled catheter. With the crossing catheter within the sheath, the glide wires removed and 8 exchange length stiff Amplatz wire was placed into the 14 French sheath. Serial dilation of the bilateral popliteal access then performed up to 16 [place]. 10 [place] proprietary Inari sheats were then placed in the bilateral popliteal access. We then initiated mechanical thrombectomy using the clot triever device from the right popliteal region. Fluoroscopic observation was performed, confirming that the device was initiated each time below the tines of the IVC filter. 4-5 passes of the device were performed before a angiogram was performed of the iliac and femoral vein through a coaxial angled catheter on the right. With resolution of the majority of thrombus on the right, we turned our attention to the left. Mechanical thrombectomy was performed on the left using the Clot Treever device. After 4-5 passes on the left side, a gentle angiogram was performed to the level of the iliac confluence. Once the clot within the bilateral iliac and femoral veins was resolved, we initiated therapy for the residual thrombus in the apex of the IVC. 64 French dilation was performed of the right popliteal vein access. The  clot tree vert device was then removed and the 20 French flow tree vert device was passed over the stiff wire into the apex of the filter. The wire was withdrawn and flow tree vert device was used for aspirating thrombus in the apex of the filter. We then passed angled catheters into the bilateral iliac veins through the access for a final angiogram confirming restoration of flow through the bilateral femoral veins, iliac veins, and the IVC and IVC filter. We attempted a balloon angioplasty for mechanical disruption of residual thrombus in the lower femoral vein on the right. Sequential 8 mm in 12 m renal balloon angioplasty performed, with no waist identified and without resolution of the thrombus. We elected withdraw at this point. Pursestring sutures placed at the bilateral popliteal vein access with pressure dressings applied  and manual pressure used for hemostasis. The patient was flipped into supine position and the right IJ sheath was removed with manual pressure for hemostasis. The patient tolerated the procedure well and remained hemodynamically stable throughout. No complications were encountered and no significant blood loss. FINDINGS: Initial ultrasound images demonstrate patency of the right internal jugular vein, persistent thrombus of the bilateral popliteal veins. 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 After mechanical and aspiration thrombectomy there is restoration of flow through the entire bilateral iliofemoral and ilio caval segments through the filter. IMPRESSION: Status post ultrasound-guided access of right internal jugular vein and bilateral popliteal vein for mechanical/aspiration thrombectomy of extensive iliocaval and iliofemoral DVT, with restoration of flow from the bilateral popliteal veins through the IVC filter via Inari device. Signed, Dulcy Fanny. Dellia Nims, RPVI [facility] Radiology Electronically Signed   By: Corrie Mckusick D.O.   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 She presents today for mechanical thrombectomy. EXAM: ULTRASOUND-GUIDED ACCESS RIGHT INTERNAL JUGULAR VEIN ULTRASOUND GUIDED ACCESS LEFT POPLITEAL VEIN ULTRASOUND GUIDED ACCESS RIGHT POPLITEAL VEIN MECHANICAL THROMBECTOMY/ASPIRATION THROMBECTOMY OF ACUTE THROMBUS OF IVC, BILATERAL ILIAC VEINS, BILATERAL FEMORAL VEINS COMPARISON:  CT IMAGING 05/12/2019 MEDICATIONS: 22000 units IV heparin ANESTHESIA/SEDATION: Versed 6.0 mg IV; Fentanyl 200 mcg IV Moderate Sedation Time:  211 The patient was continuously monitored during the procedure by the interventional radiology nurse under my direct supervision. FLUOROSCOPY TIME:  Fluoroscopy Time: 20 minutes 6 seconds (321 mGy). COMPLICATIONS: None TECHNIQUE: Operators: Dr. Ronny Bacon Dr. Corrie Mckusick Informed written consent was obtained from the patient after a thorough discussion of the procedural risks, benefits and alternatives. All questions were addressed. Maximal Sterile Barrier Technique was utilized including caps, mask, sterile gowns, sterile gloves, sterile drape, hand hygiene and skin antiseptic. A timeout was performed prior to the initiation of the procedure. Patient is brought to the IR suite and placed on the table supine position. Her identity was confirmed. The right neck was prepped and draped in the usual sterile fashion. 1% lidocaine was used for local anesthesia. Ultrasound survey of the internal jugular vein was performed with images stored sent to PACs confirming patency. A micropuncture needle was used access the right internal jugular vein artery under ultrasound. With excellent venous blood flow returned,  and an .018 micro wire was passed through the needle, observed to enter the superior vena cava. The needle was removed, and a micropuncture sheath was placed over the wire. The inner dilator and wire were removed, and an 035 Bentson wire was advanced under fluoroscopy into the IVC. With the wire in the IVC, a stab incision was made it the skin. Serial dilation 214 [place] was a treat. A 14 French 45 cm cook sheath was then placed over the wire into the IVC. The wire was removed, the sheath was flushed, and the sheath was secured at the IJ puncture site with sterile dressing. The patient was then flipped into a prone position on the IR table. The bilateral popliteal region were prepped and draped in the usual sterile fashion. Ultrasound survey of the left popliteal region was performed with images stored and sent to PACs, confirming the target of the posterior tibial vein. Flow confirmed  within the vein. A micropuncture needle was used access the left posterior tibial vein. With venous blood flow returned, and an .018 micro wire was passed through the needle, observed enter the femoral vein under fluoroscopy. The needle was removed, and a micropuncture sheath was placed over the wire. The inner dilator and wire were removed, and an stiff Glidewire was advanced under fluoroscopy into the femoral vein. The sheath was removed and a standard 6 French sheath was placed. The dilator was removed and the sheath was flushed. Glidewire was then advanced using a angled crossing catheter into the iliac vein. Ultrasound survey of the right popliteal region was performed with images stored and sent to PACs, confirming the target of the popliteal vein. Flow confirmed within the vein. A micropuncture needle was used access the right popliteal vein. With venous blood flow returned, and an .018 micro wire was passed through the needle, observed enter the right femoral vein under fluoroscopy. The needle was removed, and a micropuncture  sheath was placed over the wire. The inner dilator and wire were removed, and an stiff Glidewire was advanced under fluoroscopy into the femoral vein. The sheath was removed and a standard 6 French sheath was placed. The dilator was removed and the sheath was flushed. Glidewire was then advanced using a angled crossing catheter into the iliac vein. Crossing catheter and the Glidewire were used to navigate into the most inferior aspect of the 14 French IJ sheath. The wire was then externalized, and the 135 cm crossing catheter was externalized through the check flow valve. A stiff exchange length Amplatz wire was then placed through the crossing catheter as a flossing wire. Crossing catheter was removed. The sheath was then advanced over the wire below the tines of the IVC filter, and then a rendezvous was performed with the Glidewire from the right popliteal access using a angled catheter. With the crossing catheter within the sheath, the glide wires removed and 8 exchange length stiff Amplatz wire was placed into the 14 French sheath. Serial dilation of the bilateral popliteal access then performed up to 16 [place]. 52 [place] proprietary Inari sheats were then placed in the bilateral popliteal access. We then initiated mechanical thrombectomy using the clot triever device from the right popliteal region. Fluoroscopic observation was performed, confirming that the device was initiated each time below the tines of the IVC filter. 4-5 passes of the device were performed before a angiogram was performed of the iliac and femoral vein through a coaxial angled catheter on the right. With resolution of the majority of thrombus on the right, we turned our attention to the left. Mechanical thrombectomy was performed on the left using the Clot Treever device. After 4-5 passes on the left side, a gentle angiogram was performed to the level of the iliac confluence. Once the clot within the bilateral iliac and femoral veins  was resolved, we initiated therapy for the residual thrombus in the apex of the IVC. 38 French dilation was performed of the right popliteal vein access. The clot tree vert device was then removed and the 20 French flow tree vert device was passed over the stiff wire into the apex of the filter. The wire was withdrawn and flow tree vert device was used for aspirating thrombus in the apex of the filter. We then passed angled catheters into the bilateral iliac veins through the access for a final angiogram confirming restoration of flow through the bilateral femoral veins, iliac veins, and the IVC and IVC filter.  We attempted a balloon angioplasty for mechanical disruption of residual thrombus in the lower femoral vein on the right. Sequential 8 mm in 12 m renal balloon angioplasty performed, with no waist identified and without resolution of the thrombus. We elected withdraw at this point. Pursestring sutures placed at the bilateral popliteal vein access with pressure dressings applied and manual pressure used for hemostasis. The patient was flipped into supine position and the right IJ sheath was removed with manual pressure for hemostasis. The patient tolerated the procedure well and remained hemodynamically stable throughout. No complications were encountered and no significant blood loss. FINDINGS: Initial ultrasound images demonstrate patency of the right internal jugular vein, persistent thrombus of the bilateral popliteal veins. 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 After mechanical and aspiration thrombectomy there is restoration of flow through the entire bilateral iliofemoral and ilio caval segments through the filter. IMPRESSION: Status post ultrasound-guided access of right internal jugular vein and bilateral popliteal vein for mechanical/aspiration thrombectomy of extensive iliocaval and  iliofemoral DVT, with restoration of flow from the bilateral popliteal veins through the IVC filter via Inari device. Signed, Dulcy Fanny. Dellia Nims, RPVI [facility] Inter[facility] Radiology Special[facility] Radiology Electronically Signed   By: Corrie Mckusick D.O.   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 She presents today for mechanical thrombectomy. EXAM: ULTRASOUND-GUIDED ACCESS RIGHT INTERNAL JUGULAR VEIN ULTRASOUND GUIDED ACCESS LEFT POPLITEAL VEIN ULTRASOUND GUIDED ACCESS RIGHT POPLITEAL VEIN MECHANICAL THROMBECTOMY/ASPIRATION THROMBECTOMY OF ACUTE THROMBUS OF IVC, BILATERAL ILIAC VEINS, BILATERAL FEMORAL VEINS COMPARISON:  CT IMAGING 05/12/2019 MEDICATIONS: 22000 units IV heparin ANESTHESIA/SEDATION: Versed 6.0 mg IV; Fentanyl 200 mcg IV Moderate Sedation Time:  211 The patient was continuously monitored during the procedure by the interventional radiology nurse under my direct supervision. FLUOROSCOPY TIME:  Fluoroscopy Time: 20 minutes 6 seconds (321 mGy). COMPLICATIONS: None TECHNIQUE: Operators: Dr. Ronny Bacon Dr. Corrie Mckusick Informed written consent was obtained from the patient after a thorough discussion of the procedural risks, benefits and alternatives. All questions were addressed. Maximal Sterile Barrier Technique was utilized including caps, mask, sterile gowns, sterile gloves, sterile drape, hand hygiene and skin antiseptic. A timeout was performed prior to the initiation of the procedure. Patient is brought to the IR suite and placed on the table supine position. Her identity was confirmed. The right neck was prepped and draped in the usual sterile fashion. 1% lidocaine was used for local anesthesia. Ultrasound survey of the internal jugular vein was performed with images stored  sent to PACs confirming patency. A micropuncture needle was used access the right internal jugular vein artery under ultrasound. With excellent venous blood flow returned, and an .018 micro wire was passed through the needle, observed to enter the superior vena cava. The needle was removed, and a micropuncture sheath was placed over the wire. The inner dilator and wire were removed, and an 035 Bentson wire was advanced under fluoroscopy into the IVC. With the wire in the IVC, a stab incision was made it the skin. Serial dilation 214 [place] was a treat. A 14 French 45 cm cook sheath was then placed over the wire into the IVC. The wire was removed, the sheath was flushed, and the sheath was  secured at the IJ puncture site with sterile dressing. The patient was then flipped into a prone position on the IR table. The bilateral popliteal region were prepped and draped in the usual sterile fashion. Ultrasound survey of the left popliteal region was performed with images stored and sent to PACs, confirming the target of the posterior tibial vein. Flow confirmed within the vein. A micropuncture needle was used access the left posterior tibial vein. With venous blood flow returned, and an .018 micro wire was passed through the needle, observed enter the femoral vein under fluoroscopy. The needle was removed, and a micropuncture sheath was placed over the wire. The inner dilator and wire were removed, and an stiff Glidewire was advanced under fluoroscopy into the femoral vein. The sheath was removed and a standard 6 French sheath was placed. The dilator was removed and the sheath was flushed. Glidewire was then advanced using a angled crossing catheter into the iliac vein. Ultrasound survey of the right popliteal region was performed with images stored and sent to PACs, confirming the target of the popliteal vein. Flow confirmed within the vein. A micropuncture needle was used access the right popliteal vein. With venous  blood flow returned, and an .018 micro wire was passed through the needle, observed enter the right femoral vein under fluoroscopy. The needle was removed, and a micropuncture sheath was placed over the wire. The inner dilator and wire were removed, and an stiff Glidewire was advanced under fluoroscopy into the femoral vein. The sheath was removed and a standard 6 French sheath was placed. The dilator was removed and the sheath was flushed. Glidewire was then advanced using a angled crossing catheter into the iliac vein. Crossing catheter and the Glidewire were used to navigate into the most inferior aspect of the 14 French IJ sheath. The wire was then externalized, and the 135 cm crossing catheter was externalized through the check flow valve. A stiff exchange length Amplatz wire was then placed through the crossing catheter as a flossing wire. Crossing catheter was removed. The sheath was then advanced over the wire below the tines of the IVC filter, and then a rendezvous was performed with the Glidewire from the right popliteal access using a angled catheter. With the crossing catheter within the sheath, the glide wires removed and 8 exchange length stiff Amplatz wire was placed into the 14 French sheath. Serial dilation of the bilateral popliteal access then performed up to 16 [place]. 66 [place] proprietary Inari sheats were then placed in the bilateral popliteal access. We then initiated mechanical thrombectomy using the clot triever device from the right popliteal region. Fluoroscopic observation was performed, confirming that the device was initiated each time below the tines of the IVC filter. 4-5 passes of the device were performed before a angiogram was performed of the iliac and femoral vein through a coaxial angled catheter on the right. With resolution of the majority of thrombus on the right, we turned our attention to the left. Mechanical thrombectomy was performed on the left using the Clot  Treever device. After 4-5 passes on the left side, a gentle angiogram was performed to the level of the iliac confluence. Once the clot within the bilateral iliac and femoral veins was resolved, we initiated therapy for the residual thrombus in the apex of the IVC. 19 French dilation was performed of the right popliteal vein access. The clot tree vert device was then removed and the 20 French flow tree vert device was passed over the stiff  wire into the apex of the filter. The wire was withdrawn and flow tree vert device was used for aspirating thrombus in the apex of the filter. We then passed angled catheters into the bilateral iliac veins through the access for a final angiogram confirming restoration of flow through the bilateral femoral veins, iliac veins, and the IVC and IVC filter. We attempted a balloon angioplasty for mechanical disruption of residual thrombus in the lower femoral vein on the right. Sequential 8 mm in 12 m renal balloon angioplasty performed, with no waist identified and without resolution of the thrombus. We elected withdraw at this point. Pursestring sutures placed at the bilateral popliteal vein access with pressure dressings applied and manual pressure used for hemostasis. The patient was flipped into supine position and the right IJ sheath was removed with manual pressure for hemostasis. The patient tolerated the procedure well and remained hemodynamically stable throughout. No complications were encountered and no significant blood loss. FINDINGS: Initial ultrasound images demonstrate patency of the right internal jugular vein, persistent thrombus of the bilateral popliteal veins. 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 After mechanical and aspiration thrombectomy there is restoration of flow through the entire bilateral iliofemoral and ilio caval segments through the  filter. IMPRESSION: Status post ultrasound-guided access of right internal jugular vein and bilateral popliteal vein for mechanical/aspiration thrombectomy of extensive iliocaval and iliofemoral DVT, with restoration of flow from the bilateral popliteal veins through the IVC filter via Inari device. Signed, Dulcy Fanny. Dellia Nims, RPVI [facility] Radiology Electronically Signed   By: Corrie Mckusick D.O.   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 She presents today for mechanical thrombectomy. EXAM: ULTRASOUND-GUIDED ACCESS RIGHT INTERNAL JUGULAR VEIN ULTRASOUND GUIDED ACCESS LEFT POPLITEAL VEIN ULTRASOUND GUIDED ACCESS RIGHT POPLITEAL VEIN MECHANICAL THROMBECTOMY/ASPIRATION THROMBECTOMY OF ACUTE THROMBUS OF IVC, BILATERAL ILIAC VEINS, BILATERAL FEMORAL VEINS COMPARISON:  CT IMAGING 05/12/2019 MEDICATIONS: 22000 units IV heparin ANESTHESIA/SEDATION: Versed 6.0 mg IV; Fentanyl 200 mcg IV Moderate Sedation Time:  211 The patient was continuously monitored during the procedure by the interventional radiology nurse under my direct supervision. FLUOROSCOPY TIME:  Fluoroscopy Time: 20 minutes 6 seconds (321 mGy). COMPLICATIONS: None TECHNIQUE: Operators: Dr. Ronny Bacon Dr. Corrie Mckusick Informed written consent was obtained from the patient after a thorough discussion of the procedural risks, benefits and alternatives. All questions were addressed. Maximal Sterile Barrier Technique was utilized including caps, mask, sterile gowns, sterile gloves, sterile drape, hand hygiene and skin antiseptic. A timeout was performed prior to the initiation of the procedure. Patient is brought to the IR suite and placed on the table supine position. Her identity was confirmed. The  right neck was prepped and draped in the usual sterile fashion. 1% lidocaine was used for local anesthesia. Ultrasound survey of the internal jugular vein was performed with images stored sent to PACs confirming patency. A micropuncture needle was used access the right internal jugular vein artery under ultrasound. With excellent venous blood flow returned, and an .018 micro wire was passed through the needle, observed to enter the superior vena cava. The needle was removed, and a micropuncture sheath was placed over the wire. The inner dilator and wire were removed,  and an 035 Bentson wire was advanced under fluoroscopy into the IVC. With the wire in the IVC, a stab incision was made it the skin. Serial dilation 214 [place] was a treat. A 14 French 45 cm cook sheath was then placed over the wire into the IVC. The wire was removed, the sheath was flushed, and the sheath was secured at the IJ puncture site with sterile dressing. The patient was then flipped into a prone position on the IR table. The bilateral popliteal region were prepped and draped in the usual sterile fashion. Ultrasound survey of the left popliteal region was performed with images stored and sent to PACs, confirming the target of the posterior tibial vein. Flow confirmed within the vein. A micropuncture needle was used access the left posterior tibial vein. With venous blood flow returned, and an .018 micro wire was passed through the needle, observed enter the femoral vein under fluoroscopy. The needle was removed, and a micropuncture sheath was placed over the wire. The inner dilator and wire were removed, and an stiff Glidewire was advanced under fluoroscopy into the femoral vein. The sheath was removed and a standard 6 French sheath was placed. The dilator was removed and the sheath was flushed. Glidewire was then advanced using a angled crossing catheter into the iliac vein. Ultrasound survey of the right popliteal region was performed with  images stored and sent to PACs, confirming the target of the popliteal vein. Flow confirmed within the vein. A micropuncture needle was used access the right popliteal vein. With venous blood flow returned, and an .018 micro wire was passed through the needle, observed enter the right femoral vein under fluoroscopy. The needle was removed, and a micropuncture sheath was placed over the wire. The inner dilator and wire were removed, and an stiff Glidewire was advanced under fluoroscopy into the femoral vein. The sheath was removed and a standard 6 French sheath was placed. The dilator was removed and the sheath was flushed. Glidewire was then advanced using a angled crossing catheter into the iliac vein. Crossing catheter and the Glidewire were used to navigate into the most inferior aspect of the 14 French IJ sheath. The wire was then externalized, and the 135 cm crossing catheter was externalized through the check flow valve. A stiff exchange length Amplatz wire was then placed through the crossing catheter as a flossing wire. Crossing catheter was removed. The sheath was then advanced over the wire below the tines of the IVC filter, and then a rendezvous was performed with the Glidewire from the right popliteal access using a angled catheter. With the crossing catheter within the sheath, the glide wires removed and 8 exchange length stiff Amplatz wire was placed into the 14 French sheath. Serial dilation of the bilateral popliteal access then performed up to 16 [place]. 62 [place] proprietary Inari sheats were then placed in the bilateral popliteal access. We then initiated mechanical thrombectomy using the clot triever device from the right popliteal region. Fluoroscopic observation was performed, confirming that the device was initiated each time below the tines of the IVC filter. 4-5 passes of the device were performed before a angiogram was performed of the iliac and femoral vein through a coaxial angled  catheter on the right. With resolution of the majority of thrombus on the right, we turned our attention to the left. Mechanical thrombectomy was performed on the left using the Clot Treever device. After 4-5 passes on the left side, a gentle angiogram was performed to the level  of the iliac confluence. Once the clot within the bilateral iliac and femoral veins was resolved, we initiated therapy for the residual thrombus in the apex of the IVC. 35 French dilation was performed of the right popliteal vein access. The clot tree vert device was then removed and the 20 French flow tree vert device was passed over the stiff wire into the apex of the filter. The wire was withdrawn and flow tree vert device was used for aspirating thrombus in the apex of the filter. We then passed angled catheters into the bilateral iliac veins through the access for a final angiogram confirming restoration of flow through the bilateral femoral veins, iliac veins, and the IVC and IVC filter. We attempted a balloon angioplasty for mechanical disruption of residual thrombus in the lower femoral vein on the right. Sequential 8 mm in 12 m renal balloon angioplasty performed, with no waist identified and without resolution of the thrombus. We elected withdraw at this point. Pursestring sutures placed at the bilateral popliteal vein access with pressure dressings applied and manual pressure used for hemostasis. The patient was flipped into supine position and the right IJ sheath was removed with manual pressure for hemostasis. The patient tolerated the procedure well and remained hemodynamically stable throughout. No complications were encountered and no significant blood loss. FINDINGS: Initial ultrasound images demonstrate patency of the right internal jugular vein, persistent thrombus of the bilateral popliteal veins. Initial angiogram of the bilateral popliteal access confirm ileal femoral and ilio caval thrombosis from the filter to the  popliteal veins bilaterally. After mechanical and aspiration thrombectomy there is restoration of flow through the entire bilateral iliofemoral and ilio caval segments through the filter. IMPRESSION: Status post ultrasound-guided access of right internal jugular vein and bilateral popliteal vein for mechanical/aspiration thrombectomy of extensive iliocaval and iliofemoral DVT, with restoration of flow from the bilateral popliteal veins through the IVC filter via Inari device. Signed, Dulcy Fanny. Dellia Nims, RPVI [facility] Inter[facility] Radiology Specialists [facility] Radiology Electronically Signed   By: Corrie Mckusick D.O.   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 She presents today for mechanical thrombectomy. EXAM: ULTRASOUND-GUIDED ACCESS RIGHT INTERNAL JUGULAR VEIN ULTRASOUND GUIDED ACCESS LEFT POPLITEAL VEIN ULTRASOUND GUIDED ACCESS RIGHT POPLITEAL VEIN MECHANICAL THROMBECTOMY/ASPIRATION THROMBECTOMY OF ACUTE THROMBUS OF IVC, BILATERAL ILIAC VEINS, BILATERAL FEMORAL VEINS COMPARISON:  CT IMAGING 05/12/2019 MEDICATIONS: 22000 units IV heparin ANESTHESIA/SEDATION: Versed 6.0 mg IV; Fentanyl 200 mcg IV Moderate Sedation Time:  211 The patient was continuously monitored during the procedure by the interventional radiology nurse under my direct supervision. FLUOROSCOPY TIME:  Fluoroscopy Time: 20 minutes 6 seconds (321 mGy). COMPLICATIONS: None TECHNIQUE: Operators: Dr. Ronny Bacon Dr. Corrie Mckusick Informed written consent was obtained from the patient after a thorough discussion of the procedural risks, benefits and alternatives. All questions were addressed. Maximal Sterile Barrier Technique was utilized including caps, mask, sterile gowns, sterile gloves, sterile drape, hand hygiene and skin antiseptic.  A timeout was performed prior to the initiation of the procedure. Patient is brought to the IR suite and placed on the table supine position. Her identity was confirmed. The right neck was prepped and draped in the usual sterile fashion. 1% lidocaine was used for local anesthesia. Ultrasound survey of the internal jugular vein was performed with images stored sent  to PACs confirming patency. A micropuncture needle was used access the right internal jugular vein artery under ultrasound. With excellent venous blood flow returned, and an .018 micro wire was passed through the needle, observed to enter the superior vena cava. The needle was removed, and a micropuncture sheath was placed over the wire. The inner dilator and wire were removed, and an 035 Bentson wire was advanced under fluoroscopy into the IVC. With the wire in the IVC, a stab incision was made it the skin. Serial dilation 214 [place] was a treat. A 14 French 45 cm cook sheath was then placed over the wire into the IVC. The wire was removed, the sheath was flushed, and the sheath was secured at the IJ puncture site with sterile dressing. The patient was then flipped into a prone position on the IR table. The bilateral popliteal region were prepped and draped in the usual sterile fashion. Ultrasound survey of the left popliteal region was performed with images stored and sent to PACs, confirming the target of the posterior tibial vein. Flow confirmed within the vein. A micropuncture needle was used access the left posterior tibial vein. With venous blood flow returned, and an .018 micro wire was passed through the needle, observed enter the femoral vein under fluoroscopy. The needle was removed, and a micropuncture sheath was placed over the wire. The inner dilator and wire were removed, and an stiff Glidewire was advanced under fluoroscopy into the femoral vein. The sheath was removed and a standard 6 French sheath was placed. The dilator was removed and  the sheath was flushed. Glidewire was then advanced using a angled crossing catheter into the iliac vein. Ultrasound survey of the right popliteal region was performed with images stored and sent to PACs, confirming the target of the popliteal vein. Flow confirmed within the vein. A micropuncture needle was used access the right popliteal vein. With venous blood flow returned, and an .018 micro wire was passed through the needle, observed enter the right femoral vein under fluoroscopy. The needle was removed, and a micropuncture sheath was placed over the wire. The inner dilator and wire were removed, and an stiff Glidewire was advanced under fluoroscopy into the femoral vein. The sheath was removed and a standard 6 French sheath was placed. The dilator was removed and the sheath was flushed. Glidewire was then advanced using a angled crossing catheter into the iliac vein. Crossing catheter and the Glidewire were used to navigate into the most inferior aspect of the 14 French IJ sheath. The wire was then externalized, and the 135 cm crossing catheter was externalized through the check flow valve. A stiff exchange length Amplatz wire was then placed through the crossing catheter as a flossing wire. Crossing catheter was removed. The sheath was then advanced over the wire below the tines of the IVC filter, and then a rendezvous was performed with the Glidewire from the right popliteal access using a angled catheter. With the crossing catheter within the sheath, the glide wires removed and 8 exchange length stiff Amplatz wire was placed into the 14 French sheath. Serial dilation of the bilateral popliteal access then performed up to 16 [place]. 59 [place] proprietary Inari sheats were then placed in the bilateral popliteal access. We then initiated mechanical thrombectomy using the clot triever device from the right popliteal region. Fluoroscopic observation was performed, confirming that the device was initiated  each time below the tines of the IVC filter. 4-5 passes of the device were performed before a  angiogram was performed of the iliac and femoral vein through a coaxial angled catheter on the right. With resolution of the majority of thrombus on the right, we turned our attention to the left. Mechanical thrombectomy was performed on the left using the Clot Treever device. After 4-5 passes on the left side, a gentle angiogram was performed to the level of the iliac confluence. Once the clot within the bilateral iliac and femoral veins was resolved, we initiated therapy for the residual thrombus in the apex of the IVC. 51 French dilation was performed of the right popliteal vein access. The clot tree vert device was then removed and the 20 French flow tree vert device was passed over the stiff wire into the apex of the filter. The wire was withdrawn and flow tree vert device was used for aspirating thrombus in the apex of the filter. We then passed angled catheters into the bilateral iliac veins through the access for a final angiogram confirming restoration of flow through the bilateral femoral veins, iliac veins, and the IVC and IVC filter. We attempted a balloon angioplasty for mechanical disruption of residual thrombus in the lower femoral vein on the right. Sequential 8 mm in 12 m renal balloon angioplasty performed, with no waist identified and without resolution of the thrombus. We elected withdraw at this point. Pursestring sutures placed at the bilateral popliteal vein access with pressure dressings applied and manual pressure used for hemostasis. The patient was flipped into supine position and the right IJ sheath was removed with manual pressure for hemostasis. The patient tolerated the procedure well and remained hemodynamically stable throughout. No complications were encountered and no significant blood loss. FINDINGS: Initial ultrasound images demonstrate patency of the right internal jugular vein,  persistent thrombus of the bilateral popliteal veins. 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 After mechanical and aspiration thrombectomy there is restoration of flow through the entire bilateral iliofemoral and ilio caval segments through the filter. IMPRESSION: Status post ultrasound-guided access of right internal jugular vein and bilateral popliteal vein for mechanical/aspiration thrombectomy of extensive iliocaval and iliofemoral DVT, with restoration of flow from the bilateral popliteal veins through the IVC filter via Inari device. Signed, Dulcy Fanny. Dellia Nims, RPVI [facility] Interventional Radiology Specialists [facility] Radiology Electronically Signed   By: Corrie Mckusick D.O.   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 She presents today for mechanical thrombectomy. EXAM: ULTRASOUND-GUIDED ACCESS RIGHT INTERNAL JUGULAR VEIN ULTRASOUND GUIDED ACCESS LEFT POPLITEAL VEIN ULTRASOUND GUIDED ACCESS RIGHT POPLITEAL VEIN MECHANICAL THROMBECTOMY/ASPIRATION THROMBECTOMY OF ACUTE THROMBUS OF IVC, BILATERAL ILIAC VEINS, BILATERAL FEMORAL VEINS COMPARISON:  CT IMAGING 05/12/2019 MEDICATIONS: 22000 units IV heparin ANESTHESIA/SEDATION: Versed 6.0 mg IV; Fentanyl 200 mcg IV Moderate Sedation Time:  211 The patient was continuously monitored during the procedure by the interventional radiology nurse under my direct supervision. FLUOROSCOPY TIME:  Fluoroscopy Time: 20 minutes 6 seconds (321 mGy). COMPLICATIONS: None TECHNIQUE: Operators: Dr. Ronny Bacon Dr. Corrie Mckusick Informed written consent was obtained from the patient after a thorough discussion of the procedural risks, benefits and alternatives.  All questions were addressed. Maximal Sterile Barrier Technique was utilized including caps, mask, sterile gowns, sterile gloves, sterile drape, hand hygiene and skin antiseptic.  A timeout was performed prior to the initiation of the procedure. Patient is brought to the IR suite and placed on the table supine position. Her identity was confirmed. The right neck was prepped and draped in the usual sterile fashion. 1% lidocaine was used for local anesthesia. Ultrasound survey of the internal jugular vein was performed with images stored sent to PACs confirming patency. A micropuncture needle was used access the right internal jugular vein artery under ultrasound. With excellent venous blood flow returned, and an .018 micro wire was passed through the needle, observed to enter the superior vena cava. The needle was removed, and a micropuncture sheath was placed over the wire. The inner dilator and wire were removed, and an 035 Bentson wire was advanced under fluoroscopy into the IVC. With the wire in the IVC, a stab incision was made it the skin. Serial dilation 214 [place] was a treat. A 14 French 45 cm cook sheath was then placed over the wire into the IVC. The wire was removed, the sheath was flushed, and the sheath was secured at the IJ puncture site with sterile dressing. The patient was then flipped into a prone position on the IR table. The bilateral popliteal region were prepped and draped in the usual sterile fashion. Ultrasound survey of the left popliteal region was performed with images stored and sent to PACs, confirming the target of the posterior tibial vein. Flow confirmed within the vein. A micropuncture needle was used access the left posterior tibial vein. With venous blood flow returned, and an .018 micro wire was passed through the needle, observed enter the femoral vein under fluoroscopy. The needle was removed, and a micropuncture sheath was placed over the wire. The inner dilator and wire were  removed, and an stiff Glidewire was advanced under fluoroscopy into the femoral vein. The sheath was removed and a standard 6 French sheath was placed. The dilator was removed and the sheath was flushed. Glidewire was then advanced using a angled crossing catheter into the iliac vein. Ultrasound survey of the right popliteal region was performed with images stored and sent to PACs, confirming the target of the popliteal vein. Flow confirmed within the vein. A micropuncture needle was used access the right popliteal vein. With venous blood flow returned, and an .018 micro wire was passed through the needle, observed enter the right femoral vein under fluoroscopy. The needle was removed, and a micropuncture sheath was placed over the wire. The inner dilator and wire were removed, and an stiff Glidewire was advanced under fluoroscopy into the femoral vein. The sheath was removed and a standard 6 French sheath was placed. The dilator was removed and the sheath was flushed. Glidewire was then advanced using a angled crossing catheter into the iliac vein. Crossing catheter and the Glidewire were used to navigate into the most inferior aspect of the 14 French IJ sheath. The wire was then externalized, and the 135 cm crossing catheter was externalized through the check flow valve. A stiff exchange length Amplatz wire was then placed through the crossing catheter as a flossing wire. Crossing catheter was removed. The sheath was then advanced over the wire below the tines of the IVC filter, and then a rendezvous was performed with the Glidewire from the right popliteal access using a angled catheter. With the crossing catheter within the sheath, the glide wires removed and 8 exchange length stiff Amplatz wire was placed into the 14 French sheath. Serial dilation of the bilateral popliteal access then performed  up to [place]. 34 [place] proprietary Inari sheats were then placed in the bilateral popliteal access. We then  initiated mechanical thrombectomy using the clot triever device from the right popliteal region. Fluoroscopic observation was performed, confirming that the device was initiated each time below the tines of the IVC filter. 4-5 passes of the device were performed before a angiogram was performed of the iliac and femoral vein through a coaxial angled catheter on the right. With resolution of the majority of thrombus on the right, we turned our attention to the left. Mechanical thrombectomy was performed on the left using the Clot Treever device. After 4-5 passes on the left side, a gentle angiogram was performed to the level of the iliac confluence. Once the clot within the bilateral iliac and femoral veins was resolved, we initiated therapy for the residual thrombus in the apex of the IVC. 75 French dilation was performed of the right popliteal vein access. The clot tree vert device was then removed and the 20 French flow tree vert device was passed over the stiff wire into the apex of the filter. The wire was withdrawn and flow tree vert device was used for aspirating thrombus in the apex of the filter. We then passed angled catheters into the bilateral iliac veins through the access for a final angiogram confirming restoration of flow through the bilateral femoral veins, iliac veins, and the IVC and IVC filter. We attempted a balloon angioplasty for mechanical disruption of residual thrombus in the lower femoral vein on the right. Sequential 8 mm in 12 m renal balloon angioplasty performed, with no waist identified and without resolution of the thrombus. We elected withdraw at this point. Pursestring sutures placed at the bilateral popliteal vein access with pressure dressings applied and manual pressure used for hemostasis. The patient was flipped into supine position and the right IJ sheath was removed with manual pressure for hemostasis. The patient tolerated the procedure well and remained hemodynamically  stable throughout. No complications were encountered and no significant blood loss. FINDINGS: Initial ultrasound images demonstrate patency of the right internal jugular vein, persistent thrombus of the bilateral popliteal veins. 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 After mechanical and aspiration thrombectomy there is restoration of flow through the entire bilateral iliofemoral and ilio caval segments through the filter. IMPRESSION: Status post ultrasound-guided access of right internal jugular vein and bilateral popliteal vein for mechanical/aspiration thrombectomy of extensive iliocaval and iliofemoral DVT, with restoration of flow from the bilateral popliteal veins through the IVC filter via Inari device. Signed, Dulcy Fanny. Dellia Nims, RPVI [facility] Radiology Electronically Signed   By: Corrie Mckusick D.O.   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 She presents today for mechanical thrombectomy. EXAM: ULTRASOUND-GUIDED ACCESS RIGHT INTERNAL JUGULAR VEIN ULTRASOUND GUIDED ACCESS LEFT POPLITEAL VEIN ULTRASOUND GUIDED ACCESS RIGHT POPLITEAL VEIN MECHANICAL THROMBECTOMY/ASPIRATION THROMBECTOMY OF ACUTE THROMBUS OF IVC, BILATERAL ILIAC VEINS, BILATERAL FEMORAL VEINS COMPARISON:  CT IMAGING 05/12/2019 MEDICATIONS: 22000 units IV heparin ANESTHESIA/SEDATION: Versed 6.0 mg IV; Fentanyl 200 mcg IV Moderate Sedation Time:  211 The patient was continuously monitored during the procedure by the interventional radiology nurse under my direct supervision. FLUOROSCOPY TIME:  Fluoroscopy  Time: 20 minutes 6 seconds (321 mGy). COMPLICATIONS: None TECHNIQUE:  Operators: Dr. Ronny Bacon Dr. Corrie Mckusick Informed written consent was obtained from the patient after a thorough discussion of the procedural risks, benefits and alternatives. All questions were addressed. Maximal Sterile Barrier Technique was utilized including caps, mask, sterile gowns, sterile gloves, sterile drape, hand hygiene and skin antiseptic. A timeout was performed prior to the initiation of the procedure. Patient is brought to the IR suite and placed on the table supine position. Her identity was confirmed. The right neck was prepped and draped in the usual sterile fashion. 1% lidocaine was used for local anesthesia. Ultrasound survey of the internal jugular vein was performed with images stored sent to PACs confirming patency. A micropuncture needle was used access the right internal jugular vein artery under ultrasound. With excellent venous blood flow returned, and an .018 micro wire was passed through the needle, observed to enter the superior vena cava. The needle was removed, and a micropuncture sheath was placed over the wire. The inner dilator and wire were removed, and an 035 Bentson wire was advanced under fluoroscopy into the IVC. With the wire in the IVC, a stab incision was made it the skin. Serial dilation 214 [place] was a treat. A 14 French 45 cm cook sheath was then placed over the wire into the IVC. The wire was removed, the sheath was flushed, and the sheath was secured at the IJ puncture site with sterile dressing. The patient was then flipped into a prone position on the IR table. The bilateral popliteal region were prepped and draped in the usual sterile fashion. Ultrasound survey of the left popliteal region was performed with images stored and sent to PACs, confirming the target of the posterior tibial vein. Flow confirmed within the vein. A micropuncture needle was used access the left posterior tibial vein. With venous blood flow returned, and an .018 micro wire was passed  through the needle, observed enter the femoral vein under fluoroscopy. The needle was removed, and a micropuncture sheath was placed over the wire. The inner dilator and wire were removed, and an stiff Glidewire was advanced under fluoroscopy into the femoral vein. The sheath was removed and a standard 6 French sheath was placed. The dilator was removed and the sheath was flushed. Glidewire was then advanced using a angled crossing catheter into the iliac vein. Ultrasound survey of the right popliteal region was performed with images stored and sent to PACs, confirming the target of the popliteal vein. Flow confirmed within the vein. A micropuncture needle was used access the right popliteal vein. With venous blood flow returned, and an .018 micro wire was passed through the needle, observed enter the right femoral vein under fluoroscopy. The needle was removed, and a micropuncture sheath was placed over the wire. The inner dilator and wire were removed, and an stiff Glidewire was advanced under fluoroscopy into the femoral vein. The sheath was removed and a standard 6 French sheath was placed. The dilator was removed and the sheath was flushed. Glidewire was then advanced using a angled crossing catheter into the iliac vein. Crossing catheter and the Glidewire were used to navigate into the most inferior aspect of the 14 French IJ sheath. The wire was then externalized, and the 135 cm crossing catheter was externalized through the check flow valve. A stiff exchange length Amplatz wire was then placed through the crossing catheter as a flossing wire. Crossing catheter was removed. The sheath was then advanced over  the wire below the tines of the IVC filter, and then a rendezvous was performed with the Glidewire from the right popliteal access using a angled catheter. With the crossing catheter within the sheath, the glide wires removed and 8 exchange length stiff Amplatz wire was placed into the 14 French sheath.  Serial dilation of the bilateral popliteal access then performed up to 16 [place]. 3 [place] proprietary Inari sheats were then placed in the bilateral popliteal access. We then initiated mechanical thrombectomy using the clot triever device from the right popliteal region. Fluoroscopic observation was performed, confirming that the device was initiated each time below the tines of the IVC filter. 4-5 passes of the device were performed before a angiogram was performed of the iliac and femoral vein through a coaxial angled catheter on the right. With resolution of the majority of thrombus on the right, we turned our attention to the left. Mechanical thrombectomy was performed on the left using the Clot Treever device. After 4-5 passes on the left side, a gentle angiogram was performed to the level of the iliac confluence. Once the clot within the bilateral iliac and femoral veins was resolved, we initiated therapy for the residual thrombus in the apex of the IVC. 3 French dilation was performed of the right popliteal vein access. The clot tree vert device was then removed and the 20 French flow tree vert device was passed over the stiff wire into the apex of the filter. The wire was withdrawn and flow tree vert device was used for aspirating thrombus in the apex of the filter. We then passed angled catheters into the bilateral iliac veins through the access for a final angiogram confirming restoration of flow through the bilateral femoral veins, iliac veins, and the IVC and IVC filter. We attempted a balloon angioplasty for mechanical disruption of residual thrombus in the lower femoral vein on the right. Sequential 8 mm in 12 m renal balloon angioplasty performed, with no waist identified and without resolution of the thrombus. We elected withdraw at this point. Pursestring sutures placed at the bilateral popliteal vein access with pressure dressings applied and manual pressure used for hemostasis. The  patient was flipped into supine position and the right IJ sheath was removed with manual pressure for hemostasis. The patient tolerated the procedure well and remained hemodynamically stable throughout. No complications were encountered and no significant blood loss. FINDINGS: Initial ultrasound images demonstrate patency of the right internal jugular vein, persistent thrombus of the bilateral popliteal veins. 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 After mechanical and aspiration thrombectomy there is restoration of flow through the entire bilateral iliofemoral and ilio caval segments through the filter. IMPRESSION: Status post ultrasound-guided access of right internal jugular vein and bilateral popliteal vein for mechanical/aspiration thrombectomy of extensive iliocaval and iliofemoral DVT, with restoration of flow from the bilateral popliteal veins through the IVC filter via Inari device. Signed, Dulcy Fanny. Dellia Nims, RPVI Vascular and Interventional Radiology Specialists [facility] Radiology Electronically Signed   By: Corrie Mckusick D.O.   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 She presents today for mechanical thrombectomy. EXAM: ULTRASOUND-GUIDED ACCESS RIGHT INTERNAL JUGULAR VEIN ULTRASOUND GUIDED ACCESS LEFT POPLITEAL VEIN ULTRASOUND GUIDED ACCESS RIGHT  POPLITEAL VEIN MECHANICAL THROMBECTOMY/ASPIRATION THROMBECTOMY OF ACUTE THROMBUS OF IVC, BILATERAL ILIAC VEINS, BILATERAL FEMORAL VEINS COMPARISON:  CT IMAGING 05/12/2019 MEDICATIONS: 22000 units IV heparin ANESTHESIA/SEDATION: Versed 6.0 mg IV; Fentanyl 200 mcg IV Moderate Sedation Time:  211 The patient was continuously  monitored during the procedure by the interventional radiology nurse under my direct supervision. FLUOROSCOPY TIME:  Fluoroscopy Time: 20 minutes 6 seconds (321 mGy). COMPLICATIONS: None TECHNIQUE: Operators: Dr. Ronny Bacon Dr. Corrie Mckusick Informed written consent was obtained from the patient after a thorough discussion of the procedural risks, benefits and alternatives. All questions were addressed. Maximal Sterile Barrier Technique was utilized including caps, mask, sterile gowns, sterile gloves, sterile drape, hand hygiene and skin antiseptic. A timeout was performed prior to the initiation of the procedure. Patient is brought to the IR suite and placed on the table supine position. Her identity was confirmed. The right neck was prepped and draped in the usual sterile fashion. 1% lidocaine was used for local anesthesia. Ultrasound survey of the internal jugular vein was performed with images stored sent to PACs confirming patency. A micropuncture needle was used access the right internal jugular vein artery under ultrasound. With excellent venous blood flow returned, and an .018 micro wire was passed through the needle, observed to enter the superior vena cava. The needle was removed, and a micropuncture sheath was placed over the wire. The inner dilator and wire were removed, and an 035 Bentson wire was advanced under fluoroscopy into the IVC. With the wire in the IVC, a stab incision was made it the skin. Serial dilation 214 [place] was a treat. A 14 French 45 cm cook sheath was then placed over the wire into the IVC. The wire was removed, the sheath was flushed, and the sheath was secured at the IJ puncture site with sterile dressing. The patient was then flipped into a prone position on the IR table. The bilateral popliteal region were prepped and draped in the usual sterile fashion. Ultrasound survey of the left popliteal region was performed with images stored and sent to PACs, confirming the target of the  posterior tibial vein. Flow confirmed within the vein. A micropuncture needle was used access the left posterior tibial vein. With venous blood flow returned, and an .018 micro wire was passed through the needle, observed enter the femoral vein under fluoroscopy. The needle was removed, and a micropuncture sheath was placed over the wire. The inner dilator and wire were removed, and an stiff Glidewire was advanced under fluoroscopy into the femoral vein. The sheath was removed and a standard 6 French sheath was placed. The dilator was removed and the sheath was flushed. Glidewire was then advanced using a angled crossing catheter into the iliac vein. Ultrasound survey of the right popliteal region was performed with images stored and sent to PACs, confirming the target of the popliteal vein. Flow confirmed within the vein. A micropuncture needle was used access the right popliteal vein. With venous blood flow returned, and an .018 micro wire was passed through the needle, observed enter the right femoral vein under fluoroscopy. The needle was removed, and a micropuncture sheath was placed over the wire. The inner dilator and wire were removed, and an stiff Glidewire was advanced under fluoroscopy into the femoral vein. The sheath was removed and a standard 6 French sheath was placed. The dilator was removed and the sheath was flushed. Glidewire was then advanced using a angled crossing catheter into the iliac vein. Crossing catheter and the  Glidewire were used to navigate into the most inferior aspect of the 14 French IJ sheath. The wire was then externalized, and the 135 cm crossing catheter was externalized through the check flow valve. A stiff exchange length Amplatz wire was then placed through the crossing catheter as a flossing wire. Crossing catheter was removed. The sheath was then advanced over the wire below the tines of the IVC filter, and then a rendezvous was performed with the Glidewire from the right  popliteal access using a angled catheter. With the crossing catheter within the sheath, the glide wires removed and 8 exchange length stiff Amplatz wire was placed into the 14 French sheath. Serial dilation of the bilateral popliteal access then performed up to 16 [place]. 41 [place] proprietary Inari sheats were then placed in the bilateral popliteal access. We then initiated mechanical thrombectomy using the clot triever device from the right popliteal region. Fluoroscopic observation was performed, confirming that the device was initiated each time below the tines of the IVC filter. 4-5 passes of the device were performed before a angiogram was performed of the iliac and femoral vein through a coaxial angled catheter on the right. With resolution of the majority of thrombus on the right, we turned our attention to the left. Mechanical thrombectomy was performed on the left using the Clot Treever device. After 4-5 passes on the left side, a gentle angiogram was performed to the level of the iliac confluence. Once the clot within the bilateral iliac and femoral veins was resolved, we initiated therapy for the residual thrombus in the apex of the IVC. 75 French dilation was performed of the right popliteal vein access. The clot tree vert device was then removed and the 20 French flow tree vert device was passed over the stiff wire into the apex of the filter. The wire was withdrawn and flow tree vert device was used for aspirating thrombus in the apex of the filter. We then passed angled catheters into the bilateral iliac veins through the access for a final angiogram confirming restoration of flow through the bilateral femoral veins, iliac veins, and the IVC and IVC filter. We attempted a balloon angioplasty for mechanical disruption of residual thrombus in the lower femoral vein on the right. Sequential 8 mm in 12 m renal balloon angioplasty performed, with no waist identified and without resolution of the  thrombus. We elected withdraw at this point. Pursestring sutures placed at the bilateral popliteal vein access with pressure dressings applied and manual pressure used for hemostasis. The patient was flipped into supine position and the right IJ sheath was removed with manual pressure for hemostasis. The patient tolerated the procedure well and remained hemodynamically stable throughout. No complications were encountered and no significant blood loss. FINDINGS: Initial ultrasound images demonstrate patency of the right internal jugular vein, persistent thrombus of the bilateral popliteal veins. 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 After mechanical and aspiration thrombectomy there is restoration of flow through the entire bilateral iliofemoral and ilio caval segments through the filter. IMPRESSION: Status post ultrasound-guided access of right internal jugular vein and bilateral popliteal vein for mechanical/aspiration thrombectomy of extensive iliocaval and iliofemoral DVT, with restoration of flow from the bilateral popliteal veins through the IVC filter via Inari device. Signed, Dulcy Fanny. Dellia Nims, RPVI Vascular and Interventional Radiology Specialists [facility] Radiology Electronically Signed   By: Corrie Mckusick D.O.   On: 05/16/2019 17:51  Labs:  CBC: Recent Labs    05/16/19 2335 05/17/19 0221 05/17/19 0841 05/18/19 0249  WBC 12.9* 13.9* 12.1* 12.1*  HGB 7.8* 7.4* 8.3* 7.8*  HCT 24.9* 24.2* 26.8* 24.8*  PLT 186 180 185 173    COAGS: Recent Labs    04/27/19 2018 05/14/19 1200 05/16/19 0230  INR 1.4* 1.2 1.0  APTT  --  32  --     BMP: Recent Labs    05/14/19 0756 05/14/19 2350 05/16/19 0230 05/18/19 0249  NA 128* 128* 131* 134*  K 4.8 4.8 4.3 3.8  CL 100 102 104 110  CO2 17* 19* 19* 16*  GLUCOSE 227* 240* 177* 194*  BUN 29* 25* 20 13  CALCIUM 9.1 8.8* 8.7*  8.0*  CREATININE 0.91 0.90 0.85 0.76  GFRNONAA >60 >60 >60 >60  GFRAA >60 >60 >60 >60    LIVER FUNCTION TESTS: Recent Labs    05/10/19 2358 05/11/19 0316 05/12/19 0609 05/16/19 0230  BILITOT 2.3* 2.1* 2.2* 1.0  AST 22 23 17 24   ALT 16 13 13 16   ALKPHOS 86 77 76 87  PROT 8.5* 7.9 8.0 6.4*  ALBUMIN 4.2 3.9 3.7 2.6*    Assessment and Plan:  History of extensive bilateral LE DVT s/p mechanical/aspiration thrombectomy of IVC, bilateral iliac veins, and bilateral femoral veins 05/16/2019 by Dr. Earleen Newport and Dr. Pascal Lux. Patient's condition improving- states calf pain/tightness has improved, although still with BLE edema. Bilateral popliteal and right IJ incision sites stable. Continue with SCDs and ambulation. Plan to follow-up at our clinic for repeat duplex 4-6 weeks after discharge (order placed to facilitate this). Further plans per TRH/hematology- appreciate and agree with management. IR to follow.   Electronically Signed: Earley Abide, PA-C 05/18/2019, 1:07 PM   I spent a total of 25 Minutes at the the patient's bedside AND on the patient's hospital floor or unit, greater than 50% of which was counseling/coordinating care for thrombectomy follow-up.

## 2019-05-18 NOTE — Progress Notes (Signed)
Dressings changed on patient's legs per MD order without difficulty.  Will continue to monitor.

## 2019-05-18 NOTE — Progress Notes (Addendum)
PROGRESS NOTE  Regina Munoz WUJ:[id number] DOB: 1950/12/06 DOA: 05/10/2019 PCP: Hart RobinsonsButler, Cynthia P, DO   LOS: 7 days   Brief narrative: 69 year old woman discharged on 12/17 following treatment for Covid pneumonia diagnosed 11/30, hospital course complicated by worsening hypoxemia leading to CTA which revealed massive bilateral pulmonary embolism with RV strain, started on heparin infusion, then developed abdominal pain and CT revealed spontaneous left pelvic hematoma.  IR consulted for possible embolization but patient was stabilized after transfusion.  Left lower extremity venous ultrasound showed DVT.  IR was consulted and IVC filter placed 12/14.  She was subsequently discharged home on oxygen, no anticoagulation.  She was seen both by interventional radiology and pulmonology during that hospitalization.  Patient then presented on 12/25 with increasing fatigue.  Patient was admitted for severe sepsis from pneumonia, subsequently complained of increasing lower extremity edema and pain, ultrasound revealed extensive bilateral lower extremity DVTs with progression with concern for involvement into the pelvis and even to IVC filter.  She was transferred to [facility] for further evaluation.  She was seen by vascular surgery, but in the absence of anticoagulation, no intervention was recommended.  Interventional radiology and hematology were subsequently consulted and patient was started on heparin drip.  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  Patient presented with with tachycardia, tachypnea and lactic acidosis with acute hypoxic respiratory failure. Chest  x-ray with opacity left lung base.    Patient was empirically started on vancomycin and cefepime due to history of recent Covid pneumonia. -- MRSA PCR was negative on December 8.    Will complete 7-day course.  Blood cultures negative at 5 days.  Patient is on room air saturating 96% today.  Extensive, progressive bilateral lower extremity DVT with concern for progression to the iliac and IVC with history of IVC filter. Dr. Grace IsaacWatts IR and Dr. Pamelia HoitGudena, oncology on board and patient was started on heparin drip.   IR followed the patient and the underwent mechanical thrombectomy May 16, 2019.  Currently on  heparin drip.  I spoke with Dr.  Dion Body[facility] yesterday who recommended continued heparin drip for now due to drifting down hemoglobin.  If hemoglobin continues to trend down will likely need repeat imaging.   anticoagulation of choice on discharge would however be Lovenox. Hemoglobin trend of 7.4>8.3>7.8  Submassive bilateral PE with right-sided heart strain by CT, diagnosed previous admission (discharged 05/02/2019).   On heparin drip, s/p mechanical thrombectomy, patient has IVC filter in place.  Continue to monitor CBC for now.  Extensive large pelvic hematoma on previous hospitalization (discharged 12/17) displacing bowel and compressing bladder, likely originating from left rectus abdominis muscle.  Patient has been moving bowels with laxatives.  No significant interval change on CT 12/27 compared to previous study.   Renal function normal with creatinine of 0.9.  If her hemoglobin continues to drift down will need to repeat CT scan.  Thrombocytopenia, on admission   resolved  Hyponatremia.   Will check BMP in a.m. Last sodium of 134.  Constipation.    Improved .  Continue bowel regimen.  Diabetes mellitus type 2 -Continue sliding scale insulin, Lantus, diabetic diet.  Accu-Cheks..  Closely monitor.    Status post COVID-19 viral pneumonia treated with remdesivir and Decadron with  discharge 12/17.  Initially Covid + 04/15/2019.  VTE Prophylaxis: Heparin drip  Code Status: Full code  Family Communication:  I spoke with the patient's spouse [name] and updated the clinical condition of the patient.  I also spoke regarding the PT recommendation of skilled nursing facility.  Disposition Plan: Patient was seen by physical therapy and recommended skilled nursing facility.  Patient is however thinking of possible going home with home health.  We will continue physical therapy while in the hospital.  Closely monitor hemoglobin levels.  Continue heparin drip for now.  Consult transition of care.  Consultants:  Hematooncology  Interventional radiology  Vascular surgery  Procedures: Mechanical thrombectomy May 16, 2019.  Antibiotics: Anti-infectives (From admission, onward)   Start     Dose/Rate Route Frequency Ordered Stop   05/17/19 2100  ceFEPIme (MAXIPIME) 2 g in sodium chloride 0.9 % 100 mL IVPB     2 g 200 mL/hr over 30 Minutes Intravenous Every 12 hours 05/17/19 1230 05/19/19 2059   05/12/19 0000  vancomycin (VANCOCIN) IVPB 1000 mg/200 mL premix  Status:  Discontinued     1,000 mg 200 mL/hr over 60 Minutes Intravenous Every 24 hours 05/11/19 0108 05/14/19 1052   05/11/19 1200  ceFEPIme (MAXIPIME) 2 g in sodium chloride 0.9 % 100 mL IVPB  Status:  Discontinued     2 g 200 mL/hr over 30 Minutes Intravenous Every 12 hours 05/11/19 0611 05/17/19 1243   05/11/19 0115  vancomycin (VANCOREADY) IVPB 2000 mg/400 mL     2,000 mg 200 mL/hr over 120 Minutes Intravenous  Once 05/11/19 0100 05/11/19 0350   05/11/19 0100  ceFEPIme (MAXIPIME) 1 g in sodium chloride 0.9 % 100 mL IVPB     1 g 200 mL/hr over 30 Minutes Intravenous  Once 05/11/19 0054 05/11/19 0443      Subjective: Today, patient complains of leg pain.  On SCDs.  Denies any overt bleeding.  Denies any shortness of breath chest pain palpitation.  Has been moving  bowels.  Objective: Vitals:    05/18/19 0900 05/18/19 1141  BP:  (!) 95/49  Pulse:  90  Resp: 18 (!) 22  Temp:  98.3 F (36.8 C)  SpO2:  96%    Intake/Output Summary (Last 24 hours) at 05/18/2019 1142 Last data filed at 05/18/2019 1141 Gross per 24 hour  Intake 551.73 ml  Output 1650 ml  Net -1098.27 ml   Filed Weights   05/10/19 2352  Weight: 106.6 kg   Body mass index is 39.11 kg/m.   Physical Exam:  General: Obese, not in obvious distress HENT: Normocephalic, pupils equally reacting to light and accommodation.  No scleral pallor or icterus noted. Oral mucosa is moist.  Chest:  Clear breath sounds.  Diminished breath sounds bilaterally. No crackles or wheezes.  CVS: S1 &S2 heard. No murmur.  Regular rate and rhythm. Abdomen: Soft, nontender, nondistended.  Bowel sounds are heard.  Liver is not palpable, no abdominal mass palpated Extremities: No cyanosis, clubbing but with bilateral lower extremity pitting edema with tenderness.  Peripheral pulses are palpable. Psych: Alert, awake and oriented, normal mood CNS:  No cranial nerve deficits.  Power equal in all extremities.  No sensory deficits noted.  No cerebellar signs.   Skin: Warm and dry.  No rashes noted.   Data Review: I have personally reviewed the following laboratory data and studies,  CBC: Recent Labs  Lab 05/16/19 0230 05/16/19 2335 05/17/19 0221 05/17/19 0841 05/18/19 0249  WBC 12.4* 12.9* 13.9* 12.1* 12.1*  NEUTROABS  --  8.1*  --  8.7*  --   HGB 9.7* 7.8* 7.4* 8.3* 7.8*  HCT 30.2* 24.9* 24.2* 26.8* 24.8*  MCV 85.3 87.4 88.3 89.0 87.3  PLT 192 186 180 185 173   Basic Metabolic Panel: Recent Labs  Lab 05/12/19 0609 05/14/19 0756 05/14/19 2350 05/16/19 0230 05/18/19 0249  NA 129* 128* 128* 131* 134*  K 4.4 4.8 4.8 4.3 3.8  CL 98 100 102 104 110  CO2 18* 17* 19* 19* 16*  GLUCOSE 233* 227* 240* 177* 194*  BUN 27* 29* 25* 20 13  CREATININE 1.19* 0.91 0.90 0.85 0.76  CALCIUM 9.5 9.1 8.8* 8.7* 8.0*  MG 1.9  --   --  2.0 1.8    PHOS 3.3  --   --   --   --    Liver Function Tests: Recent Labs  Lab 05/12/19 0609 05/16/19 0230  AST 17 24  ALT 13 16  ALKPHOS 76 87  BILITOT 2.2* 1.0  PROT 8.0 6.4*  ALBUMIN 3.7 2.6*   No results for input(s): LIPASE, AMYLASE in the last 168 hours. No results for input(s): AMMONIA in the last 168 hours. Cardiac Enzymes: No results for input(s): CKTOTAL, CKMB, CKMBINDEX, TROPONINI in the last 168 hours. BNP (last 3 results) Recent Labs    04/24/19 0500 05/10/19 2358  BNP 193.7* 20.0    ProBNP (last 3 results) No results for input(s): PROBNP in the last 8760 hours.  CBG: Recent Labs  Lab 05/17/19 1205 05/17/19 1650 05/17/19 2141 05/18/19 0746 05/18/19 1135  GLUCAP 156* 149* 183* 146* 177*   Recent Results (from the past 240 hour(s))  Culture, blood (routine x 2) Call MD if unable to obtain prior to antibiotics being given     Status: None   Collection Time: 05/11/19  1:22 AM   Specimen: BLOOD LEFT HAND  Result Value Ref Range Status   Specimen Description BLOOD LEFT HAND  Final   Special Requests   Final    BOTTLES DRAWN AEROBIC AND ANAEROBIC Blood Culture adequate volume   Culture   Final    NO GROWTH 5 DAYS Performed at [facility], [address], [place], [place] [address]    Report Status 05/16/2019 FINAL  Final  Culture, blood (routine x 2) Call MD if unable to obtain prior to antibiotics being given     Status: None   Collection Time: 05/11/19  3:16 AM   Specimen: BLOOD LEFT HAND  Result Value Ref Range Status   Specimen Description BLOOD LEFT HAND  Final   Special Requests   Final    BOTTLES DRAWN AEROBIC ONLY Blood Culture results may not be optimal due to an inadequate volume of blood received in culture bottles   Culture   Final    NO GROWTH 5 DAYS Performed at [facility], [address], [place], [place] [address]    Report Status 05/16/2019 FINAL  Final  Surgical PCR screen     Status: None   Collection Time: 05/16/19  6:09 AM    Specimen: Nasal Mucosa; Nasal Swab  Result Value Ref Range Status   MRSA, PCR NEGATIVE NEGATIVE Final   Staphylococcus aureus NEGATIVE NEGATIVE Final    Comment: (NOTE) The Xpert SA Assay (FDA approved for NASAL specimens in patients 62 years of age and older), is one component of  a comprehensive surveillance program. It is not intended to diagnose infection nor to guide or monitor treatment. Performed at [facility], 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 She presents today for mechanical thrombectomy. EXAM: ULTRASOUND-GUIDED ACCESS RIGHT INTERNAL JUGULAR VEIN ULTRASOUND GUIDED ACCESS LEFT POPLITEAL VEIN ULTRASOUND GUIDED ACCESS RIGHT POPLITEAL VEIN MECHANICAL THROMBECTOMY/ASPIRATION THROMBECTOMY OF ACUTE THROMBUS OF IVC, BILATERAL ILIAC VEINS, BILATERAL FEMORAL VEINS COMPARISON:  CT IMAGING 05/12/2019 MEDICATIONS: 22000 units IV heparin ANESTHESIA/SEDATION: Versed 6.0 mg IV; Fentanyl 200 mcg IV Moderate Sedation Time:  211 The patient was continuously monitored during the procedure by the interventional radiology nurse under my direct supervision. FLUOROSCOPY TIME:  Fluoroscopy Time: 20 minutes 6 seconds (321 mGy). COMPLICATIONS: None TECHNIQUE: Operators: Dr. Katherina Right Dr. Gilmer Mor Informed written consent was obtained from the patient after a thorough discussion of the procedural risks, benefits and alternatives. All questions were addressed. Maximal Sterile Barrier Technique was utilized including caps, mask, sterile gowns, sterile gloves, sterile drape, hand hygiene and skin antiseptic. A timeout was performed prior to the initiation of the procedure. Patient is brought to the IR suite and placed on the table supine position. Her identity was confirmed. The  right neck was prepped and draped in the usual sterile fashion. 1% lidocaine was used for local anesthesia. Ultrasound survey of the internal jugular vein was performed with images stored sent to PACs confirming patency. A micropuncture needle was used access the right internal jugular vein artery under ultrasound. With excellent venous blood flow returned, and an .018 micro wire was passed through the needle, observed to enter the superior vena cava. The needle was removed, and a micropuncture sheath was placed over the wire. The inner dilator and wire were removed, and an 035 Bentson wire was advanced under fluoroscopy into the IVC. With the wire in the IVC, a stab incision was made it the skin. Serial dilation 214 [place] was a treat. A 14 French 45 cm cook sheath was then placed over the wire into the IVC. The wire was removed, the sheath was flushed, and the sheath was secured at the IJ puncture site with sterile dressing. The patient was then flipped into a prone position on the IR table. The bilateral popliteal region were prepped and draped in the usual sterile fashion. Ultrasound survey of the left popliteal region was performed with images stored and sent to PACs, confirming the target of the posterior tibial vein. Flow confirmed within the vein. A micropuncture needle was used access the left posterior tibial vein. With venous blood flow returned, and an .018 micro wire was passed through the needle, observed enter the femoral vein under fluoroscopy. The needle was removed, and a micropuncture sheath was placed over the wire. The inner dilator and wire were removed, and an stiff Glidewire was advanced under fluoroscopy into the femoral vein. The sheath was removed and a standard 6 French sheath was placed. The dilator was removed and the sheath was flushed. Glidewire was then advanced using a angled crossing catheter into the iliac vein. Ultrasound survey of the right popliteal region was performed with  images stored and sent to PACs, confirming the target of the popliteal vein. Flow confirmed within the vein. A micropuncture needle was used access the right popliteal vein. With  venous blood flow returned, and an .018 micro wire was passed through the needle, observed enter the right femoral vein under fluoroscopy. The needle was removed, and a micropuncture sheath was placed over the wire. The inner dilator and wire were removed, and an stiff Glidewire was advanced under fluoroscopy into the femoral vein. The sheath was removed and a standard 6 French sheath was placed. The dilator was removed and the sheath was flushed. Glidewire was then advanced using a angled crossing catheter into the iliac vein. Crossing catheter and the Glidewire were used to navigate into the most inferior aspect of the 14 French IJ sheath. The wire was then externalized, and the 135 cm crossing catheter was externalized through the check flow valve. A stiff exchange length Amplatz wire was then placed through the crossing catheter as a flossing wire. Crossing catheter was removed. The sheath was then advanced over the wire below the tines of the IVC filter, and then a rendezvous was performed with the Glidewire from the right popliteal access using a angled catheter. With the crossing catheter within the sheath, the glide wires removed and 8 exchange length stiff Amplatz wire was placed into the 14 French sheath. Serial dilation of the bilateral popliteal access then performed up to 16 [place]. Sixteen [place] proprietary Inari sheats were then placed in the bilateral popliteal access. We then initiated mechanical thrombectomy using the clot triever device from the right popliteal region. Fluoroscopic observation was performed, confirming that the device was initiated each time below the tines of the IVC filter. 4-5 passes of the device were performed before a angiogram was performed of the iliac and femoral vein through a coaxial angled  catheter on the right. With resolution of the majority of thrombus on the right, we turned our attention to the left. Mechanical thrombectomy was performed on the left using the Clot Treever device. After 4-5 passes on the left side, a gentle angiogram was performed to the level of the iliac confluence. Once the clot within the bilateral iliac and femoral veins was resolved, we initiated therapy for the residual thrombus in the apex of the IVC. Twenty French dilation was performed of the right popliteal vein access. The clot tree vert device was then removed and the 20 French flow tree vert device was passed over the stiff wire into the apex of the filter. The wire was withdrawn and flow tree vert device was used for aspirating thrombus in the apex of the filter. We then passed angled catheters into the bilateral iliac veins through the access for a final angiogram confirming restoration of flow through the bilateral femoral veins, iliac veins, and the IVC and IVC filter. We attempted a balloon angioplasty for mechanical disruption of residual thrombus in the lower femoral vein on the right. Sequential 8 mm in 12 m renal balloon angioplasty performed, with no waist identified and without resolution of the thrombus. We elected withdraw at this point. Pursestring sutures placed at the bilateral popliteal vein access with pressure dressings applied and manual pressure used for hemostasis. The patient was flipped into supine position and the right IJ sheath was removed with manual pressure for hemostasis. The patient tolerated the procedure well and remained hemodynamically stable throughout. No complications were encountered and no significant blood loss. FINDINGS: Initial ultrasound images demonstrate patency of the right internal jugular vein, persistent thrombus of the bilateral popliteal veins. Initial angiogram of the bilateral popliteal access confirm ileal femoral and ilio caval thrombosis from the filter to the  popliteal veins bilaterally. After mechanical and aspiration thrombectomy there is restoration of flow through the entire bilateral iliofemoral and ilio caval segments through the filter. IMPRESSION: Status post ultrasound-guided access of right internal jugular vein and bilateral popliteal vein for mechanical/aspiration thrombectomy of extensive iliocaval and iliofemoral DVT, with restoration of flow from the bilateral popliteal veins through the IVC filter via Inari device. Signed, Yvone Neu. Reyne Dumas, RPVI [facility] Rad[facility] Electronically Signed   By: Gilmer Mor D.O.   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 She presents today for mechanical thrombectomy. EXAM: ULTRASOUND-GUIDED ACCESS RIGHT INTERNAL JUGULAR VEIN ULTRASOUND GUIDED ACCESS LEFT POPLITEAL VEIN ULTRASOUND GUIDED ACCESS RIGHT POPLITEAL VEIN MECHANICAL THROMBECTOMY/ASPIRATION THROMBECTOMY OF ACUTE THROMBUS OF IVC, BILATERAL ILIAC VEINS, BILATERAL FEMORAL VEINS COMPARISON:  CT IMAGING 05/12/2019 MEDICATIONS: 22000 units IV heparin ANESTHESIA/SEDATION: Versed 6.0 mg IV; Fentanyl 200 mcg IV Moderate Sedation Time:  211 The patient was continuously monitored during the procedure by the interventional radiology nurse under my direct supervision. FLUOROSCOPY TIME:  Fluoroscopy Time: 20 minutes 6 seconds (321 mGy). COMPLICATIONS: None TECHNIQUE: Operators: Dr. Katherina Right Dr. Gilmer Mor Informed written consent was obtained from the patient after a thorough discussion of the procedural risks, benefits and alternatives. All questions were addressed. Maximal Sterile Barrier Technique was utilized including caps, mask, sterile gowns, sterile gloves, sterile drape, hand hygiene and skin antiseptic. A timeout  was performed prior to the initiation of the procedure. Patient is brought to the IR suite and placed on the table supine position. Her identity was confirmed. The right neck was prepped and draped in the usual sterile fashion. 1% lidocaine was used for local anesthesia. Ultrasound survey of the internal jugular vein was performed with images stored sent to PACs confirming patency. A micropuncture needle was used access the right internal jugular vein artery under ultrasound. With excellent venous blood flow returned, and an .018 micro wire was passed through the needle, observed to enter the superior vena cava. The needle was removed, and a micropuncture sheath was placed over the wire. The inner dilator and wire were removed, and an 035 Bentson wire was advanced under fluoroscopy into the IVC. With the wire in the IVC, a stab incision was made it the skin. Serial dilation 214 [place] was a treat. A 14 French 45 cm cook sheath was then placed over the wire into the IVC. The wire was removed, the sheath was flushed, and the sheath was secured at the IJ puncture site with sterile dressing. The patient was then flipped into a prone position on the IR table. The bilateral popliteal region were prepped and draped in the usual sterile fashion. Ultrasound survey of the left popliteal region was performed with images stored and sent to PACs, confirming the target of the posterior tibial vein. Flow confirmed within the vein. A micropuncture needle was used access the left posterior tibial vein. With venous blood flow returned, and an .018 micro wire was passed through the needle, observed enter the femoral vein under fluoroscopy. The needle was removed, and a micropuncture sheath was placed over the wire. The inner dilator and wire were removed, and an stiff Glidewire was advanced under fluoroscopy into the femoral vein. The sheath was removed and a standard 6 French sheath was placed. The dilator was removed  and the sheath  was flushed. Glidewire was then advanced using a angled crossing catheter into the iliac vein. Ultrasound survey of the right popliteal region was performed with images stored and sent to PACs, confirming the target of the popliteal vein. Flow confirmed within the vein. A micropuncture needle was used access the right popliteal vein. With venous blood flow returned, and an .018 micro wire was passed through the needle, observed enter the right femoral vein under fluoroscopy. The needle was removed, and a micropuncture sheath was placed over the wire. The inner dilator and wire were removed, and an stiff Glidewire was advanced under fluoroscopy into the femoral vein. The sheath was removed and a standard 6 French sheath was placed. The dilator was removed and the sheath was flushed. Glidewire was then advanced using a angled crossing catheter into the iliac vein. Crossing catheter and the Glidewire were used to navigate into the most inferior aspect of the 14 French IJ sheath. The wire was then externalized, and the 135 cm crossing catheter was externalized through the check flow valve. A stiff exchange length Amplatz wire was then placed through the crossing catheter as a flossing wire. Crossing catheter was removed. The sheath was then advanced over the wire below the tines of the IVC filter, and then a rendezvous was performed with the Glidewire from the right popliteal access using a angled catheter. With the crossing catheter within the sheath, the glide wires removed and 8 exchange length stiff Amplatz wire was placed into the 14 French sheath. Serial dilation of the bilateral popliteal access then performed up to 16 [place]. Sixteen [place] proprietary Inari sheats were then placed in the bilateral popliteal access. We then initiated mechanical thrombectomy using the clot triever device from the right popliteal region. Fluoroscopic observation was performed, confirming that the device was initiated each time  below the tines of the IVC filter. 4-5 passes of the device were performed before a angiogram was performed of the iliac and femoral vein through a coaxial angled catheter on the right. With resolution of the majority of thrombus on the right, we turned our attention to the left. Mechanical thrombectomy was performed on the left using the Clot Treever device. After 4-5 passes on the left side, a gentle angiogram was performed to the level of the iliac confluence. Once the clot within the bilateral iliac and femoral veins was resolved, we initiated therapy for the residual thrombus in the apex of the IVC. Twenty French dilation was performed of the right popliteal vein access. The clot tree vert device was then removed and the 20 French flow tree vert device was passed over the stiff wire into the apex of the filter. The wire was withdrawn and flow tree vert device was used for aspirating thrombus in the apex of the filter. We then passed angled catheters into the bilateral iliac veins through the access for a final angiogram confirming restoration of flow through the bilateral femoral veins, iliac veins, and the IVC and IVC filter. We attempted a balloon angioplasty for mechanical disruption of residual thrombus in the lower femoral vein on the right. Sequential 8 mm in 12 m renal balloon angioplasty performed, with no waist identified and without resolution of the thrombus. We elected withdraw at this point. Pursestring sutures placed at the bilateral popliteal vein access with pressure dressings applied and manual pressure used for hemostasis. The patient was flipped into supine position and the right IJ sheath was removed with manual pressure for hemostasis. The  patient tolerated the procedure well and remained hemodynamically stable throughout. No complications were encountered and no significant blood loss. FINDINGS: Initial ultrasound images demonstrate patency of the right internal jugular vein, persistent  thrombus of the bilateral popliteal veins. 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 After mechanical and aspiration thrombectomy there is restoration of flow through the entire bilateral iliofemoral and ilio caval segments through the filter. IMPRESSION: Status post ultrasound-guided access of right internal jugular vein and bilateral popliteal vein for mechanical/aspiration thrombectomy of extensive iliocaval and iliofemoral DVT, with restoration of flow from the bilateral popliteal veins through the IVC filter via Inari device. Signed, Yvone Neu. Reyne Dumas, RPVI [facility] Radiology Electronically Signed   By: Gilmer Mor D.O.   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 She presents today for mechanical thrombectomy. EXAM: ULTRASOUND-GUIDED ACCESS RIGHT INTERNAL JUGULAR VEIN ULTRASOUND GUIDED ACCESS LEFT POPLITEAL VEIN ULTRASOUND GUIDED ACCESS RIGHT POPLITEAL VEIN MECHANICAL THROMBECTOMY/ASPIRATION THROMBECTOMY OF ACUTE THROMBUS OF IVC, BILATERAL ILIAC VEINS, BILATERAL FEMORAL VEINS COMPARISON:  CT IMAGING 05/12/2019 MEDICATIONS: 22000 units IV heparin ANESTHESIA/SEDATION: Versed 6.0 mg IV; Fentanyl 200 mcg IV Moderate Sedation Time:  211 The patient was continuously monitored during the procedure by the interventional radiology nurse under my direct supervision. FLUOROSCOPY TIME:  Fluoroscopy Time: 20 minutes 6 seconds (321 mGy). COMPLICATIONS: None TECHNIQUE: Operators: Dr. Katherina Right Dr. Gilmer Mor Informed written consent was obtained from the patient after a thorough discussion of the procedural risks, benefits and alternatives. All  questions were addressed. Maximal Sterile Barrier Technique was utilized including caps, mask, sterile gowns, sterile gloves, sterile drape, hand hygiene and skin antiseptic. A timeout was performed prior to the initiation of the procedure. Patient is brought to the IR suite and placed on the table supine position. Her identity was confirmed. The right neck was prepped and draped in the usual sterile fashion. 1% lidocaine was used for local anesthesia. Ultrasound survey of the internal jugular vein was performed with images stored sent to PACs confirming patency. A micropuncture needle was used access the right internal jugular vein artery under ultrasound. With excellent venous blood flow returned, and an .018 micro wire was passed through the needle, observed to enter the superior vena cava. The needle was removed, and a micropuncture sheath was placed over the wire. The inner dilator and wire were removed, and an 035 Bentson wire was advanced under fluoroscopy into the IVC. With the wire in the IVC, a stab incision was made it the skin. Serial dilation 214 [place] was a treat. A 14 French 45 cm cook sheath was then placed over the wire into the IVC. The wire was removed, the sheath was flushed, and the sheath was secured at the IJ puncture site with sterile dressing. The patient was then flipped into a prone position on the IR table. The bilateral popliteal region were prepped and draped in the usual sterile fashion. Ultrasound survey of the left popliteal region was performed with images stored and sent to PACs, confirming the target of the posterior tibial vein. Flow confirmed within the vein. A micropuncture needle was used access the left posterior tibial vein. With venous blood flow returned, and an .018 micro  wire was passed through the needle, observed enter the femoral vein under fluoroscopy. The needle was removed, and a micropuncture sheath was placed over the wire. The inner dilator and wire were  removed, and an stiff Glidewire was advanced under fluoroscopy into the femoral vein. The sheath was removed and a standard 6 French sheath was placed. The dilator was removed and the sheath was flushed. Glidewire was then advanced using a angled crossing catheter into the iliac vein. Ultrasound survey of the right popliteal region was performed with images stored and sent to PACs, confirming the target of the popliteal vein. Flow confirmed within the vein. A micropuncture needle was used access the right popliteal vein. With venous blood flow returned, and an .018 micro wire was passed through the needle, observed enter the right femoral vein under fluoroscopy. The needle was removed, and a micropuncture sheath was placed over the wire. The inner dilator and wire were removed, and an stiff Glidewire was advanced under fluoroscopy into the femoral vein. The sheath was removed and a standard 6 French sheath was placed. The dilator was removed and the sheath was flushed. Glidewire was then advanced using a angled crossing catheter into the iliac vein. Crossing catheter and the Glidewire were used to navigate into the most inferior aspect of the 14 French IJ sheath. The wire was then externalized, and the 135 cm crossing catheter was externalized through the check flow valve. A stiff exchange length Amplatz wire was then placed through the crossing catheter as a flossing wire. Crossing catheter was removed. The sheath was then advanced over the wire below the tines of the IVC filter, and then a rendezvous was performed with the Glidewire from the right popliteal access using a angled catheter. With the crossing catheter within the sheath, the glide wires removed and 8 exchange length stiff Amplatz wire was placed into the 14 French sheath. Serial dilation of the bilateral popliteal access then performed up to 16 [place]. Sixteen [place] proprietary Inari sheats were then placed in the bilateral popliteal access. We then  initiated mechanical thrombectomy using the clot triever device from the right popliteal region. Fluoroscopic observation was performed, confirming that the device was initiated each time below the tines of the IVC filter. 4-5 passes of the device were performed before a angiogram was performed of the iliac and femoral vein through a coaxial angled catheter on the right. With resolution of the majority of thrombus on the right, we turned our attention to the left. Mechanical thrombectomy was performed on the left using the Clot Treever device. After 4-5 passes on the left side, a gentle angiogram was performed to the level of the iliac confluence. Once the clot within the bilateral iliac and femoral veins was resolved, we initiated therapy for the residual thrombus in the apex of the IVC. Twenty French dilation was performed of the right popliteal vein access. The clot tree vert device was then removed and the 20 French flow tree vert device was passed over the stiff wire into the apex of the filter. The wire was withdrawn and flow tree vert device was used for aspirating thrombus in the apex of the filter. We then passed angled catheters into the bilateral iliac veins through the access for a final angiogram confirming restoration of flow through the bilateral femoral veins, iliac veins, and the IVC and IVC filter. We attempted a balloon angioplasty for mechanical disruption of residual thrombus in the lower femoral vein on the right. Sequential 8 mm in  12 m renal balloon angioplasty performed, with no waist identified and without resolution of the thrombus. We elected withdraw at this point. Pursestring sutures placed at the bilateral popliteal vein access with pressure dressings applied and manual pressure used for hemostasis. The patient was flipped into supine position and the right IJ sheath was removed with manual pressure for hemostasis. The patient tolerated the procedure well and remained hemodynamically  stable throughout. No complications were encountered and no significant blood loss. FINDINGS: Initial ultrasound images demonstrate patency of the right internal jugular vein, persistent thrombus of the bilateral popliteal veins. 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 After mechanical and aspiration thrombectomy there is restoration of flow through the entire bilateral iliofemoral and ilio caval segments through the filter. IMPRESSION: Status post ultrasound-guided access of right internal jugular vein and bilateral popliteal vein for mechanical/aspiration thrombectomy of extensive iliocaval and iliofemoral DVT, with restoration of flow from the bilateral popliteal veins through the IVC filter via Inari device. Signed, Yvone Neu. Reyne Dumas, RPVI [facility] Radiology Electronically Signed   By: Gilmer Mor D.O.   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 She presents today for mechanical thrombectomy. EXAM: ULTRASOUND-GUIDED ACCESS RIGHT INTERNAL JUGULAR VEIN ULTRASOUND GUIDED ACCESS LEFT POPLITEAL VEIN ULTRASOUND GUIDED ACCESS RIGHT POPLITEAL VEIN MECHANICAL THROMBECTOMY/ASPIRATION THROMBECTOMY OF ACUTE THROMBUS OF IVC, BILATERAL ILIAC VEINS, BILATERAL FEMORAL VEINS COMPARISON:  CT IMAGING 05/12/2019 MEDICATIONS: 22000 units IV heparin ANESTHESIA/SEDATION: Versed 6.0 mg IV; Fentanyl 200 mcg IV Moderate Sedation Time:  211 The patient was continuously monitored during the procedure by the interventional radiology nurse under my direct supervision. FLUOROSCOPY TIME:  Fluoroscopy Time: 20 minutes 6 seconds (321 mGy). COMPLICATIONS: None TECHNIQUE:  Operators: Dr. Katherina Right Dr. Gilmer Mor Informed written consent was obtained from the patient after a thorough discussion of the procedural risks, benefits and alternatives. All questions were addressed. Maximal Sterile Barrier Technique was utilized including caps, mask, sterile gowns, sterile gloves, sterile drape, hand hygiene and skin antiseptic. A timeout was performed prior to the initiation of the procedure. Patient is brought to the IR suite and placed on the table supine position. Her identity was confirmed. The right neck was prepped and draped in the usual sterile fashion. 1% lidocaine was used for local anesthesia. Ultrasound survey of the internal jugular vein was performed with images stored sent to PACs confirming patency. A micropuncture needle was used access the right internal jugular vein artery under ultrasound. With excellent venous blood flow returned, and an .018 micro wire was passed through the needle, observed to enter the superior vena cava. The needle was removed, and a micropuncture sheath was placed over the wire. The inner dilator and wire were removed, and an 035 Bentson wire was advanced under fluoroscopy into the IVC. With the wire in the IVC, a stab incision was made it the skin. Serial dilation 214 [place] was a treat. A 14 French 45 cm cook sheath was then placed over the wire into the IVC. The wire was removed, the sheath was flushed, and the sheath was secured at the IJ puncture site with sterile dressing. The patient was then flipped into a prone position on the IR table. The  bilateral popliteal region were prepped and draped in the usual sterile fashion. Ultrasound survey of the left popliteal region was performed with images stored and sent to PACs, confirming the target of the posterior tibial vein. Flow confirmed within the vein. A micropuncture needle was used access the left posterior tibial vein. With venous blood flow returned, and an .018 micro wire was passed  through the needle, observed enter the femoral vein under fluoroscopy. The needle was removed, and a micropuncture sheath was placed over the wire. The inner dilator and wire were removed, and an stiff Glidewire was advanced under fluoroscopy into the femoral vein. The sheath was removed and a standard 6 French sheath was placed. The dilator was removed and the sheath was flushed. Glidewire was then advanced using a angled crossing catheter into the iliac vein. Ultrasound survey of the right popliteal region was performed with images stored and sent to PACs, confirming the target of the popliteal vein. Flow confirmed within the vein. A micropuncture needle was used access the right popliteal vein. With venous blood flow returned, and an .018 micro wire was passed through the needle, observed enter the right femoral vein under fluoroscopy. The needle was removed, and a micropuncture sheath was placed over the wire. The inner dilator and wire were removed, and an stiff Glidewire was advanced under fluoroscopy into the femoral vein. The sheath was removed and a standard 6 French sheath was placed. The dilator was removed and the sheath was flushed. Glidewire was then advanced using a angled crossing catheter into the iliac vein. Crossing catheter and the Glidewire were used to navigate into the most inferior aspect of the 14 French IJ sheath. The wire was then externalized, and the 135 cm crossing catheter was externalized through the check flow valve. A stiff exchange length Amplatz wire was then placed through the crossing catheter as a flossing wire. Crossing catheter was removed. The sheath was then advanced over the wire below the tines of the IVC filter, and then a rendezvous was performed with the Glidewire from the right popliteal access using a angled catheter. With the crossing catheter within the sheath, the glide wires removed and 8 exchange length stiff Amplatz wire was placed into the 14 French sheath.  Serial dilation of the bilateral popliteal access then performed up to 16 [place]. Sixteen [place] proprietary Inari sheats were then placed in the bilateral popliteal access. We then initiated mechanical thrombectomy using the clot triever device from the right popliteal region. Fluoroscopic observation was performed, confirming that the device was initiated each time below the tines of the IVC filter. 4-5 passes of the device were performed before a angiogram was performed of the iliac and femoral vein through a coaxial angled catheter on the right. With resolution of the majority of thrombus on the right, we turned our attention to the left. Mechanical thrombectomy was performed on the left using the Clot Treever device. After 4-5 passes on the left side, a gentle angiogram was performed to the level of the iliac confluence. Once the clot within the bilateral iliac and femoral veins was resolved, we initiated therapy for the residual thrombus in the apex of the IVC. Twenty French dilation was performed of the right popliteal vein access. The clot tree vert device was then removed and the 20 French flow tree vert device was passed over the stiff wire into the apex of the filter. The wire was withdrawn and flow tree vert device was used for aspirating thrombus in the  apex of the filter. We then passed angled catheters into the bilateral iliac veins through the access for a final angiogram confirming restoration of flow through the bilateral femoral veins, iliac veins, and the IVC and IVC filter. We attempted a balloon angioplasty for mechanical disruption of residual thrombus in the lower femoral vein on the right. Sequential 8 mm in 12 m renal balloon angioplasty performed, with no waist identified and without resolution of the thrombus. We elected withdraw at this point. Pursestring sutures placed at the bilateral popliteal vein access with pressure dressings applied and manual pressure used for hemostasis. The  patient was flipped into supine position and the right IJ sheath was removed with manual pressure for hemostasis. The patient tolerated the procedure well and remained hemodynamically stable throughout. No complications were encountered and no significant blood loss. FINDINGS: Initial ultrasound images demonstrate patency of the right internal jugular vein, persistent thrombus of the bilateral popliteal veins. 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 After mechanical and aspiration thrombectomy there is restoration of flow through the entire bilateral iliofemoral and ilio caval segments through the filter. IMPRESSION: Status post ultrasound-guided access of right internal jugular vein and bilateral popliteal vein for mechanical/aspiration thrombectomy of extensive iliocaval and iliofemoral DVT, with restoration of flow from the bilateral popliteal veins through the IVC filter via Inari device. Signed, Yvone Neu. Reyne Dumas, RPVI [facility] Radiology Electronically Signed   By: Gilmer Mor D.O.   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 She presents today for mechanical thrombectomy. EXAM: ULTRASOUND-GUIDED ACCESS RIGHT INTERNAL JUGULAR VEIN ULTRASOUND GUIDED ACCESS LEFT POPLITEAL VEIN ULTRASOUND GUIDED ACCESS RIGHT POPLITEAL VEIN MECHANICAL THROMBECTOMY/ASPIRATION THROMBECTOMY OF ACUTE THROMBUS OF IVC, BILATERAL ILIAC VEINS, BILATERAL FEMORAL VEINS COMPARISON:  CT IMAGING 05/12/2019 MEDICATIONS: 22000 units IV heparin ANESTHESIA/SEDATION: Versed 6.0 mg IV; Fentanyl 200 mcg IV Moderate Sedation Time:  211 The patient was continuously monitored during  the procedure by the interventional radiology nurse under my direct supervision. FLUOROSCOPY TIME:  Fluoroscopy Time: 20 minutes 6 seconds (321 mGy). COMPLICATIONS: None TECHNIQUE: Operators: Dr. Katherina Right Dr. Gilmer Mor Informed written consent was obtained from the patient after a thorough discussion of the procedural risks, benefits and alternatives. All questions were addressed. Maximal Sterile Barrier Technique was utilized including caps, mask, sterile gowns, sterile gloves, sterile drape, hand hygiene and skin antiseptic. A timeout was performed prior to the initiation of the procedure. Patient is brought to the IR suite and placed on the table supine position. Her identity was confirmed. The right neck was prepped and draped in the usual sterile fashion. 1% lidocaine was used for local anesthesia. Ultrasound survey of the internal jugular vein was performed with images stored sent to PACs confirming patency. A micropuncture needle was used access the right internal jugular vein artery under ultrasound. With excellent venous blood flow returned, and an .018 micro wire was passed through the needle, observed to enter the superior vena cava. The needle was removed, and a micropuncture sheath was placed over the wire. The inner dilator and wire were removed, and an 035 Bentson wire was advanced under fluoroscopy into the IVC. With the wire in the IVC, a stab incision was made  it the skin. Serial dilation 214 [place] was a treat. A 14 French 45 cm cook sheath was then placed over the wire into the IVC. The wire was removed, the sheath was flushed, and the sheath was secured at the IJ puncture site with sterile dressing. The patient was then flipped into a prone position on the IR table. The bilateral popliteal region were prepped and draped in the usual sterile fashion. Ultrasound survey of the left popliteal region was performed with images stored and sent to PACs, confirming the target of the posterior tibial  vein. Flow confirmed within the vein. A micropuncture needle was used access the left posterior tibial vein. With venous blood flow returned, and an .018 micro wire was passed through the needle, observed enter the femoral vein under fluoroscopy. The needle was removed, and a micropuncture sheath was placed over the wire. The inner dilator and wire were removed, and an stiff Glidewire was advanced under fluoroscopy into the femoral vein. The sheath was removed and a standard 6 French sheath was placed. The dilator was removed and the sheath was flushed. Glidewire was then advanced using a angled crossing catheter into the iliac vein. Ultrasound survey of the right popliteal region was performed with images stored and sent to PACs, confirming the target of the popliteal vein. Flow confirmed within the vein. A micropuncture needle was used access the right popliteal vein. With venous blood flow returned, and an .018 micro wire was passed through the needle, observed enter the right femoral vein under fluoroscopy. The needle was removed, and a micropuncture sheath was placed over the wire. The inner dilator and wire were removed, and an stiff Glidewire was advanced under fluoroscopy into the femoral vein. The sheath was removed and a standard 6 French sheath was placed. The dilator was removed and the sheath was flushed. Glidewire was then advanced using a angled crossing catheter into the iliac vein. Crossing catheter and the Glidewire were used to navigate into the most inferior aspect of the 14 French IJ sheath. The wire was then externalized, and the 135 cm crossing catheter was externalized through the check flow valve. A stiff exchange length Amplatz wire was then placed through the crossing catheter as a flossing wire. Crossing catheter was removed. The sheath was then advanced over the wire below the tines of the IVC filter, and then a rendezvous was performed with the Glidewire from the right popliteal access  using a angled catheter. With the crossing catheter within the sheath, the glide wires removed and 8 exchange length stiff Amplatz wire was placed into the 14 French sheath. Serial dilation of the bilateral popliteal access then performed up to 16 [place]. Sixteen [place] proprietary Inari sheats were then placed in the bilateral popliteal access. We then initiated mechanical thrombectomy using the clot triever device from the right popliteal region. Fluoroscopic observation was performed, confirming that the device was initiated each time below the tines of the IVC filter. 4-5 passes of the device were performed before a angiogram was performed of the iliac and femoral vein through a coaxial angled catheter on the right. With resolution of the majority of thrombus on the right, we turned our attention to the left. Mechanical thrombectomy was performed on the left using the Clot Treever device. After 4-5 passes on the left side, a gentle angiogram was performed to the level of the iliac confluence. Once the clot within the bilateral iliac and femoral veins was resolved, we initiated therapy for the residual thrombus  in the apex of the IVC. Twenty French dilation was performed of the right popliteal vein access. The clot tree vert device was then removed and the 20 French flow tree vert device was passed over the stiff wire into the apex of the filter. The wire was withdrawn and flow tree vert device was used for aspirating thrombus in the apex of the filter. We then passed angled catheters into the bilateral iliac veins through the access for a final angiogram confirming restoration of flow through the bilateral femoral veins, iliac veins, and the IVC and IVC filter. We attempted a balloon angioplasty for mechanical disruption of residual thrombus in the lower femoral vein on the right. Sequential 8 mm in 12 m renal balloon angioplasty performed, with no waist identified and without resolution of the thrombus. We  elected withdraw at this point. Pursestring sutures placed at the bilateral popliteal vein access with pressure dressings applied and manual pressure used for hemostasis. The patient was flipped into supine position and the right IJ sheath was removed with manual pressure for hemostasis. The patient tolerated the procedure well and remained hemodynamically stable throughout. No complications were encountered and no significant blood loss. FINDINGS: Initial ultrasound images demonstrate patency of the right internal jugular vein, persistent thrombus of the bilateral popliteal veins. 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 After mechanical and aspiration thrombectomy there is restoration of flow through the entire bilateral iliofemoral and ilio caval segments through the filter. IMPRESSION: Status post ultrasound-guided access of right internal jugular vein and bilateral popliteal vein for mechanical/aspiration thrombectomy of extensive iliocaval and iliofemoral DVT, with restoration of flow from the bilateral popliteal veins through the IVC filter via Inari device. Signed, Yvone Neu. Reyne Dumas, RPVI [facility] Radiology Electronically Signed   By: Gilmer Mor D.O.   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 She presents today for mechanical thrombectomy. EXAM: ULTRASOUND-GUIDED ACCESS RIGHT INTERNAL JUGULAR VEIN ULTRASOUND GUIDED ACCESS LEFT POPLITEAL VEIN ULTRASOUND GUIDED ACCESS RIGHT POPLITEAL VEIN MECHANICAL THROMBECTOMY/ASPIRATION THROMBECTOMY OF ACUTE THROMBUS OF IVC, BILATERAL ILIAC VEINS, BILATERAL FEMORAL VEINS COMPARISON:  CT IMAGING  05/12/2019 MEDICATIONS: 22000 units IV heparin ANESTHESIA/SEDATION: Versed 6.0 mg IV; Fentanyl 200 mcg IV Moderate Sedation Time:  211 The patient was continuously monitored during the procedure by the interventional radiology nurse under my direct supervision. FLUOROSCOPY TIME:  Fluoroscopy Time: 20 minutes 6 seconds (321 mGy). COMPLICATIONS: None TECHNIQUE: Operators: Dr. Katherina Right Dr. Gilmer Mor Informed written consent was obtained from the patient after a thorough discussion of the procedural risks, benefits and alternatives. All questions were addressed. Maximal Sterile Barrier Technique was utilized including caps, mask, sterile gowns, sterile gloves, sterile drape, hand hygiene and skin antiseptic. A timeout was performed prior to the initiation of the procedure. Patient is brought to the IR suite and placed on the table supine position. Her identity was confirmed. The right neck was prepped and draped in the usual sterile fashion. 1% lidocaine was used for local anesthesia. Ultrasound survey of the internal jugular vein was performed with images stored sent to PACs confirming patency. A micropuncture needle was used access the right internal jugular vein artery under ultrasound. With excellent venous blood flow  returned, and an .018 micro wire was passed through the needle, observed to enter the superior vena cava. The needle was removed, and a micropuncture sheath was placed over the wire. The inner dilator and wire were removed, and an 035 Bentson wire was advanced under fluoroscopy into the IVC. With the wire in the IVC, a stab incision was made it the skin. Serial dilation 214 [place] was a treat. A 14 French 45 cm cook sheath was then placed over the wire into the IVC. The wire was removed, the sheath was flushed, and the sheath was secured at the IJ puncture site with sterile dressing. The patient was then flipped into a prone position on the IR table. The bilateral popliteal region were prepped and  draped in the usual sterile fashion. Ultrasound survey of the left popliteal region was performed with images stored and sent to PACs, confirming the target of the posterior tibial vein. Flow confirmed within the vein. A micropuncture needle was used access the left posterior tibial vein. With venous blood flow returned, and an .018 micro wire was passed through the needle, observed enter the femoral vein under fluoroscopy. The needle was removed, and a micropuncture sheath was placed over the wire. The inner dilator and wire were removed, and an stiff Glidewire was advanced under fluoroscopy into the femoral vein. The sheath was removed and a standard 6 French sheath was placed. The dilator was removed and the sheath was flushed. Glidewire was then advanced using a angled crossing catheter into the iliac vein. Ultrasound survey of the right popliteal region was performed with images stored and sent to PACs, confirming the target of the popliteal vein. Flow confirmed within the vein. A micropuncture needle was used access the right popliteal vein. With venous blood flow returned, and an .018 micro wire was passed through the needle, observed enter the right femoral vein under fluoroscopy. The needle was removed, and a micropuncture sheath was placed over the wire. The inner dilator and wire were removed, and an stiff Glidewire was advanced under fluoroscopy into the femoral vein. The sheath was removed and a standard 6 French sheath was placed. The dilator was removed and the sheath was flushed. Glidewire was then advanced using a angled crossing catheter into the iliac vein. Crossing catheter and the Glidewire were used to navigate into the most inferior aspect of the 14 French IJ sheath. The wire was then externalized, and the 135 cm crossing catheter was externalized through the check flow valve. A stiff exchange length Amplatz wire was then placed through the crossing catheter as a flossing wire. Crossing  catheter was removed. The sheath was then advanced over the wire below the tines of the IVC filter, and then a rendezvous was performed with the Glidewire from the right popliteal access using a angled catheter. With the crossing catheter within the sheath, the glide wires removed and 8 exchange length stiff Amplatz wire was placed into the 14 French sheath. Serial dilation of the bilateral popliteal access then performed up to 16 [place]. Sixteen [place] proprietary Inari sheats were then placed in the bilateral popliteal access. We then initiated mechanical thrombectomy using the clot triever device from the right popliteal region. Fluoroscopic observation was performed, confirming that the device was initiated each time below the tines of the IVC filter. 4-5 passes of the device were performed before a angiogram was performed of the iliac and femoral vein through a coaxial angled catheter on the right. With resolution of the majority of  thrombus on the right, we turned our attention to the left. Mechanical thrombectomy was performed on the left using the Clot Treever device. After 4-5 passes on the left side, a gentle angiogram was performed to the level of the iliac confluence. Once the clot within the bilateral iliac and femoral veins was resolved, we initiated therapy for the residual thrombus in the apex of the IVC. Twenty French dilation was performed of the right popliteal vein access. The clot tree vert device was then removed and the 20 French flow tree vert device was passed over the stiff wire into the apex of the filter. The wire was withdrawn and flow tree vert device was used for aspirating thrombus in the apex of the filter. We then passed angled catheters into the bilateral iliac veins through the access for a final angiogram confirming restoration of flow through the bilateral femoral veins, iliac veins, and the IVC and IVC filter. We attempted a balloon angioplasty for mechanical disruption of  residual thrombus in the lower femoral vein on the right. Sequential 8 mm in 12 m renal balloon angioplasty performed, with no waist identified and without resolution of the thrombus. We elected withdraw at this point. Pursestring sutures placed at the bilateral popliteal vein access with pressure dressings applied and manual pressure used for hemostasis. The patient was flipped into supine position and the right IJ sheath was removed with manual pressure for hemostasis. The patient tolerated the procedure well and remained hemodynamically stable throughout. No complications were encountered and no significant blood loss. FINDINGS: Initial ultrasound images demonstrate patency of the right internal jugular vein, persistent thrombus of the bilateral popliteal veins. 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 After mechanical and aspiration thrombectomy there is restoration of flow through the entire bilateral iliofemoral and ilio caval segments through the filter. IMPRESSION: Status post ultrasound-guided access of right internal jugular vein and bilateral popliteal vein for mechanical/aspiration thrombectomy of extensive iliocaval and iliofemoral DVT, with restoration of flow from the bilateral popliteal veins through the IVC filter via Inari device. Signed, Yvone Neu. Reyne Dumas, RPVI [facility] Inter[facility] Radiology Special[facility] Radiology Electronically Signed   By: Gilmer Mor D.O.   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 She presents today for mechanical thrombectomy. EXAM: ULTRASOUND-GUIDED ACCESS RIGHT INTERNAL JUGULAR VEIN ULTRASOUND GUIDED ACCESS LEFT POPLITEAL VEIN  ULTRASOUND GUIDED ACCESS RIGHT POPLITEAL VEIN MECHANICAL THROMBECTOMY/ASPIRATION THROMBECTOMY OF ACUTE THROMBUS OF IVC, BILATERAL ILIAC VEINS, BILATERAL FEMORAL VEINS COMPARISON:  CT IMAGING 05/12/2019 MEDICATIONS: 22000 units IV heparin ANESTHESIA/SEDATION: Versed 6.0 mg IV; Fentanyl 200 mcg IV Moderate Sedation Time:  211 The patient was continuously monitored during the procedure by the interventional radiology nurse under my direct supervision. FLUOROSCOPY TIME:  Fluoroscopy Time: 20 minutes 6 seconds (321 mGy). COMPLICATIONS: None TECHNIQUE: Operators: Dr. Katherina Right Dr. Gilmer Mor Informed written consent was obtained from the patient after a thorough discussion of the procedural risks, benefits and alternatives. All questions were addressed. Maximal Sterile Barrier Technique was utilized including caps, mask, sterile gowns, sterile gloves, sterile drape, hand hygiene and skin antiseptic. A timeout was performed prior to the initiation of the procedure. Patient is brought to the IR suite and placed on the table  supine position. Her identity was confirmed. The right neck was prepped and draped in the usual sterile fashion. 1% lidocaine was used for local anesthesia. Ultrasound survey of the internal jugular vein was performed with images stored sent to PACs confirming patency. A micropuncture needle was used access the right internal jugular vein artery under ultrasound. With excellent venous blood flow returned, and an .018 micro wire was passed through the needle, observed to enter the superior vena cava. The needle was removed, and a micropuncture sheath was placed over the wire. The inner dilator and wire were removed, and an 035 Bentson wire was advanced under fluoroscopy into the IVC. With the wire in the IVC, a stab incision was made it the skin. Serial dilation 214 [place] was a treat. A 14 French 45 cm cook sheath was then placed over the wire into the IVC. The wire was removed, the sheath was flushed,  and the sheath was secured at the IJ puncture site with sterile dressing. The patient was then flipped into a prone position on the IR table. The bilateral popliteal region were prepped and draped in the usual sterile fashion. Ultrasound survey of the left popliteal region was performed with images stored and sent to PACs, confirming the target of the posterior tibial vein. Flow confirmed within the vein. A micropuncture needle was used access the left posterior tibial vein. With venous blood flow returned, and an .018 micro wire was passed through the needle, observed enter the femoral vein under fluoroscopy. The needle was removed, and a micropuncture sheath was placed over the wire. The inner dilator and wire were removed, and an stiff Glidewire was advanced under fluoroscopy into the femoral vein. The sheath was removed and a standard 6 French sheath was placed. The dilator was removed and the sheath was flushed. Glidewire was then advanced using a angled crossing catheter into the iliac vein. Ultrasound survey of the right popliteal region was performed with images stored and sent to PACs, confirming the target of the popliteal vein. Flow confirmed within the vein. A micropuncture needle was used access the right popliteal vein. With venous blood flow returned, and an .018 micro wire was passed through the needle, observed enter the right femoral vein under fluoroscopy. The needle was removed, and a micropuncture sheath was placed over the wire. The inner dilator and wire were removed, and an stiff Glidewire was advanced under fluoroscopy into the femoral vein. The sheath was removed and a standard 6 French sheath was placed. The dilator was removed and the sheath was flushed. Glidewire was then advanced using a angled crossing catheter into the iliac vein. Crossing catheter and the Glidewire were used to navigate into the most inferior aspect of the 14 French IJ sheath. The wire was then externalized, and the  135 cm crossing catheter was externalized through the check flow valve. A stiff exchange length Amplatz wire was then placed through the crossing catheter as a flossing wire. Crossing catheter was removed. The sheath was then advanced over the wire below the tines of the IVC filter, and then a rendezvous was performed with the Glidewire from the right popliteal access using a angled catheter. With the crossing catheter within the sheath, the glide wires removed and 8 exchange length stiff Amplatz wire was placed into the 14 French sheath. Serial dilation of the bilateral popliteal access then performed up to 16 [place]. Sixteen [place] proprietary Inari sheats were then placed in the bilateral popliteal access. We then initiated mechanical thrombectomy  using the clot triever device from the right popliteal region. Fluoroscopic observation was performed, confirming that the device was initiated each time below the tines of the IVC filter. 4-5 passes of the device were performed before a angiogram was performed of the iliac and femoral vein through a coaxial angled catheter on the right. With resolution of the majority of thrombus on the right, we turned our attention to the left. Mechanical thrombectomy was performed on the left using the Clot Treever device. After 4-5 passes on the left side, a gentle angiogram was performed to the level of the iliac confluence. Once the clot within the bilateral iliac and femoral veins was resolved, we initiated therapy for the residual thrombus in the apex of the IVC. Twenty French dilation was performed of the right popliteal vein access. The clot tree vert device was then removed and the 20 French flow tree vert device was passed over the stiff wire into the apex of the filter. The wire was withdrawn and flow tree vert device was used for aspirating thrombus in the apex of the filter. We then passed angled catheters into the bilateral iliac veins through the access for a final  angiogram confirming restoration of flow through the bilateral femoral veins, iliac veins, and the IVC and IVC filter. We attempted a balloon angioplasty for mechanical disruption of residual thrombus in the lower femoral vein on the right. Sequential 8 mm in 12 m renal balloon angioplasty performed, with no waist identified and without resolution of the thrombus. We elected withdraw at this point. Pursestring sutures placed at the bilateral popliteal vein access with pressure dressings applied and manual pressure used for hemostasis. The patient was flipped into supine position and the right IJ sheath was removed with manual pressure for hemostasis. The patient tolerated the procedure well and remained hemodynamically stable throughout. No complications were encountered and no significant blood loss. FINDINGS: Initial ultrasound images demonstrate patency of the right internal jugular vein, persistent thrombus of the bilateral popliteal veins. 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 After mechanical and aspiration thrombectomy there is restoration of flow through the entire bilateral iliofemoral and ilio caval segments through the filter. IMPRESSION: Status post ultrasound-guided access of right internal jugular vein and bilateral popliteal vein for mechanical/aspiration thrombectomy of extensive iliocaval and iliofemoral DVT, with restoration of flow from the bilateral popliteal veins through the IVC filter via Inari device. Signed, Yvone Neu. Reyne Dumas, RPVI [facility]iology Special[facility] Radiology Electronically Signed   By: Gilmer Mor D.O.   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 She presents today for mechanical thrombectomy. EXAM: ULTRASOUND-GUIDED ACCESS RIGHT INTERNAL JUGULAR VEIN ULTRASOUND GUIDED ACCESS LEFT POPLITEAL VEIN ULTRASOUND GUIDED ACCESS RIGHT POPLITEAL VEIN MECHANICAL THROMBECTOMY/ASPIRATION THROMBECTOMY OF ACUTE THROMBUS OF IVC, BILATERAL ILIAC VEINS, BILATERAL FEMORAL VEINS COMPARISON:  CT IMAGING 05/12/2019 MEDICATIONS: 22000 units IV heparin ANESTHESIA/SEDATION: Versed 6.0 mg IV; Fentanyl 200 mcg IV Moderate Sedation Time:  211 The patient was continuously monitored during the procedure by the interventional radiology nurse under my direct supervision. FLUOROSCOPY TIME:  Fluoroscopy Time: 20 minutes 6 seconds (321 mGy). COMPLICATIONS: None TECHNIQUE: Operators: Dr. Katherina Right Dr. Gilmer Mor Informed written consent was obtained  from the patient after a thorough discussion of the procedural risks, benefits and alternatives. All questions were addressed. Maximal Sterile Barrier Technique was utilized including caps, mask, sterile gowns, sterile gloves, sterile drape, hand hygiene and skin antiseptic. A timeout was performed prior to the initiation of the procedure. Patient is brought to the IR suite and placed on the table supine position. Her identity was confirmed. The right neck was prepped and draped in the usual sterile fashion. 1% lidocaine was used for local anesthesia. Ultrasound survey of the internal jugular vein was performed with images stored sent to PACs confirming patency. A micropuncture needle was used access the right internal jugular vein artery under ultrasound. With excellent venous blood flow returned, and an .018 micro wire was passed through the needle, observed to enter the superior vena cava. The needle was removed, and a micropuncture sheath was placed over the wire. The inner dilator and wire were removed, and an 035 Bentson wire was advanced under fluoroscopy into the IVC. With the wire in the IVC, a stab  incision was made it the skin. Serial dilation 214 [place] was a treat. A 14 French 45 cm cook sheath was then placed over the wire into the IVC. The wire was removed, the sheath was flushed, and the sheath was secured at the IJ puncture site with sterile dressing. The patient was then flipped into a prone position on the IR table. The bilateral popliteal region were prepped and draped in the usual sterile fashion. Ultrasound survey of the left popliteal region was performed with images stored and sent to PACs, confirming the target of the posterior tibial vein. Flow confirmed within the vein. A micropuncture needle was used access the left posterior tibial vein. With venous blood flow returned, and an .018 micro wire was passed through the needle, observed enter the femoral vein under fluoroscopy. The needle was removed, and a micropuncture sheath was placed over the wire. The inner dilator and wire were removed, and an stiff Glidewire was advanced under fluoroscopy into the femoral vein. The sheath was removed and a standard 6 French sheath was placed. The dilator was removed and the sheath was flushed. Glidewire was then advanced using a angled crossing catheter into the iliac vein. Ultrasound survey of the right popliteal region was performed with images stored and sent to PACs, confirming the target of the popliteal vein. Flow confirmed within the vein. A micropuncture needle was used access the right popliteal vein. With venous blood flow returned, and an .018 micro wire was passed through the needle, observed enter the right femoral vein under fluoroscopy. The needle was removed, and a micropuncture sheath was placed over the wire. The inner dilator and wire were removed, and an stiff Glidewire was advanced under fluoroscopy into the femoral vein. The sheath was removed and a standard 6 French sheath was placed. The dilator was removed and the sheath was flushed. Glidewire was then advanced using a angled  crossing catheter into the iliac vein. Crossing catheter and the Glidewire were used to navigate into the most inferior aspect of the 14 French IJ sheath. The wire was then externalized, and the 135 cm crossing catheter was externalized through the check flow valve. A stiff exchange length Amplatz wire was then placed through the crossing catheter as a flossing wire. Crossing catheter was removed. The sheath was then advanced over the wire below the tines of the IVC filter, and then a rendezvous was performed with the Glidewire from the right popliteal  access using a angled catheter. With the crossing catheter within the sheath, the glide wires removed and 8 exchange length stiff Amplatz wire was placed into the 14 French sheath. Serial dilation of the bilateral popliteal access then performed up to 16 [place]. 62 [place] proprietary Inari sheats were then placed in the bilateral popliteal access. We then initiated mechanical thrombectomy using the clot triever device from the right popliteal region. Fluoroscopic observation was performed, confirming that the device was initiated each time below the tines of the IVC filter. 4-5 passes of the device were performed before a angiogram was performed of the iliac and femoral vein through a coaxial angled catheter on the right. With resolution of the majority of thrombus on the right, we turned our attention to the left. Mechanical thrombectomy was performed on the left using the Clot Treever device. After 4-5 passes on the left side, a gentle angiogram was performed to the level of the iliac confluence. Once the clot within the bilateral iliac and femoral veins was resolved, we initiated therapy for the residual thrombus in the apex of the IVC. 67 French dilation was performed of the right popliteal vein access. The clot tree vert device was then removed and the 20 French flow tree vert device was passed over the stiff wire into the apex of the filter. The wire  was withdrawn and flow tree vert device was used for aspirating thrombus in the apex of the filter. We then passed angled catheters into the bilateral iliac veins through the access for a final angiogram confirming restoration of flow through the bilateral femoral veins, iliac veins, and the IVC and IVC filter. We attempted a balloon angioplasty for mechanical disruption of residual thrombus in the lower femoral vein on the right. Sequential 8 mm in 12 m renal balloon angioplasty performed, with no waist identified and without resolution of the thrombus. We elected withdraw at this point. Pursestring sutures placed at the bilateral popliteal vein access with pressure dressings applied and manual pressure used for hemostasis. The patient was flipped into supine position and the right IJ sheath was removed with manual pressure for hemostasis. The patient tolerated the procedure well and remained hemodynamically stable throughout. No complications were encountered and no significant blood loss. FINDINGS: Initial ultrasound images demonstrate patency of the right internal jugular vein, persistent thrombus of the bilateral popliteal veins. 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 After mechanical and aspiration thrombectomy there is restoration of flow through the entire bilateral iliofemoral and ilio caval segments through the filter. IMPRESSION: Status post ultrasound-guided access of right internal jugular vein and bilateral popliteal vein for mechanical/aspiration thrombectomy of extensive iliocaval and iliofemoral DVT, with restoration of flow from the bilateral popliteal veins through the IVC filter via Inari device. Signed, Dulcy Fanny. Dellia Nims, RPVI [facility] Electronically Signed   By: Corrie Mckusick D.O.   On: 05/16/2019 17:51    Scheduled  Meds: . ascorbic acid  500 mg Oral Daily  . cholecalciferol  1,000 Units Oral Daily  . dextromethorphan-guaiFENesin  1 tablet Oral BID  . feeding supplement (GLUCERNA SHAKE)  237 mL Oral TID BM  . insulin aspart  0-15 Units Subcutaneous TID WC  . insulin aspart  0-5 Units Subcutaneous QHS  . insulin glargine  10 Units Subcutaneous Daily  . loratadine  10 mg Oral Daily  . pantoprazole  40 mg Oral  Daily  . polyethylene glycol  17 g Oral BID  . senna-docusate  2 tablet Oral BID  . umeclidinium bromide  1 puff Inhalation Daily  . zinc sulfate  220 mg Oral Daily    Continuous Infusions: . sodium chloride 50 mL/hr at 05/17/19 1835  . sodium chloride Stopped (05/17/19 1834)  . ceFEPime (MAXIPIME) IV 2 g (05/18/19 0934)  . heparin 1,550 Units/hr (05/18/19 [id number])     Joycelyn DasLaxman Aithana Kushner, MD  [facility] 05/18/2019

## 2019-05-19 LAB — COMPREHENSIVE METABOLIC PANEL
ALT: 12 U/L (ref 0–44)
AST: 27 U/L (ref 15–41)
Albumin: 2 g/dL — ABNORMAL LOW (ref 3.5–5.0)
Alkaline Phosphatase: 67 U/L (ref 38–126)
Anion gap: 7 (ref 5–15)
BUN: 9 mg/dL (ref 8–23)
CO2: 18 mmol/L — ABNORMAL LOW (ref 22–32)
Calcium: 8.3 mg/dL — ABNORMAL LOW (ref 8.9–10.3)
Chloride: 112 mmol/L — ABNORMAL HIGH (ref 98–111)
Creatinine, Ser: 0.73 mg/dL (ref 0.44–1.00)
GFR calc Af Amer: >60 mL/min (ref >60)
GFR calc non Af Amer: >60 mL/min (ref >60)
Glucose, Bld: 127 mg/dL — ABNORMAL HIGH (ref 70–99)
Potassium: 3.4 mmol/L — ABNORMAL LOW (ref 3.5–5.1)
Sodium: 137 mmol/L (ref 135–145)
Total Bilirubin: 0.7 mg/dL (ref 0.3–1.2)
Total Protein: 5.3 g/dL — ABNORMAL LOW (ref 6.5–8.1)

## 2019-05-19 LAB — CBC
HCT: 25.3 % — ABNORMAL LOW (ref 36.0–46.0)
Hemoglobin: 7.9 g/dL — ABNORMAL LOW (ref 12.0–15.0)
MCH: 27.3 pg (ref 26.0–34.0)
MCHC: 31.2 g/dL (ref 30.0–36.0)
MCV: 87.5 fL (ref 80.0–100.0)
Platelets: 188 10*3/uL (ref 150–400)
RBC: 2.89 MIL/uL — ABNORMAL LOW (ref 3.87–5.11)
RDW: 20.2 % — ABNORMAL HIGH (ref 11.5–15.5)
WBC: 11.3 10*3/uL — ABNORMAL HIGH (ref 4.0–10.5)
nRBC: 0.3 % — ABNORMAL HIGH (ref 0.0–0.2)

## 2019-05-19 LAB — GLUCOSE, CAPILLARY
Glucose-Capillary: 128 mg/dL — ABNORMAL HIGH (ref 70–99)
Glucose-Capillary: 201 mg/dL — ABNORMAL HIGH (ref 70–99)
Glucose-Capillary: 180 mg/dL — ABNORMAL HIGH (ref 70–99)
Glucose-Capillary: 157 mg/dL — ABNORMAL HIGH (ref 70–99)

## 2019-05-19 LAB — MAGNESIUM: Magnesium: 1.8 mg/dL (ref 1.7–2.4)

## 2019-05-19 LAB — HEPARIN LEVEL (UNFRACTIONATED): Heparin Unfractionated: 0.3 IU/mL (ref 0.30–0.70)

## 2019-05-19 MED ORDER — NYSTATIN 100000 UNIT/ML MT SUSP
5.0000 mL | Freq: Four times a day (QID) | OROMUCOSAL | Status: DC
  Administered 2019-05-19 – 2019-05-21 (×9): 500000 [IU] via ORAL
  Filled 2019-05-19 (×8): qty 5

## 2019-05-19 MED ORDER — POTASSIUM CHLORIDE CRYS ER 20 MEQ PO TBCR
40.0000 meq | EXTENDED_RELEASE_TABLET | Freq: Every day | ORAL | Status: DC
  Administered 2019-05-20 – 2019-05-21 (×2): 40 meq via ORAL
  Filled 2019-05-19 (×2): qty 2

## 2019-05-19 MED ORDER — POTASSIUM CHLORIDE CRYS ER 20 MEQ PO TBCR
40.0000 meq | EXTENDED_RELEASE_TABLET | Freq: Once | ORAL | Status: AC
  Administered 2019-05-19: 40 meq via ORAL
  Filled 2019-05-19: qty 2

## 2019-05-19 MED ORDER — ENOXAPARIN SODIUM 100 MG/ML ~~LOC~~ SOLN
100.0000 mg | Freq: Two times a day (BID) | SUBCUTANEOUS | Status: DC
  Administered 2019-05-19 – 2019-05-21 (×5): 100 mg via SUBCUTANEOUS
  Filled 2019-05-19 (×5): qty 1

## 2019-05-19 NOTE — Progress Notes (Signed)
PROGRESS NOTE  ORRIE LASCANO CNO:[id number] DOB: Jun 20, 1950 DOA: 05/10/2019 PCP: [name], DO   LOS: 8 days   Brief narrative: 69 year old woman discharged on 12/17 following treatment for Covid pneumonia diagnosed 11/30, hospital course complicated by worsening hypoxemia leading to CTA which revealed massive bilateral pulmonary embolism with RV strain, started on heparin infusion, then developed abdominal pain and CT revealed spontaneous left pelvic hematoma.  IR consulted for possible embolization but patient was stabilized after transfusion.  Left lower extremity venous ultrasound showed DVT.  IR was consulted and IVC filter placed 12/14.  She was subsequently discharged home on oxygen, no anticoagulation.  She was seen both by interventional radiology and pulmonology during that hospitalization.  Patient then presented on 12/25 with increasing fatigue.  Patient was admitted for severe sepsis from pneumonia, subsequently complained of increasing lower extremity edema and pain, ultrasound revealed extensive bilateral lower extremity DVTs with progression with concern for involvement into the pelvis and even to IVC filter.  She was transferred to [facility] for further evaluation.  She was seen by vascular surgery, but in the absence of anticoagulation, no intervention was recommended.  Interventional radiology and hematology were subsequently consulted and patient was started on heparin drip.  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  Patient presented with with tachycardia, tachypnea and lactic acidosis with acute hypoxic respiratory failure. Chest  x-ray with opacity of the left lung base.    Patient was empirically started on vancomycin and cefepime due to history of recent Covid pneumonia. -- MRSA PCR was negative on December 8.    Will complete 7-day course, will complete today. Blood cultures negative at 5 days.  Patient is on room air saturating 96% today.  Extensive, progressive bilateral lower extremity DVT with concern for progression to the iliac and IVC with history of IVC filter. Dr. Grace Isaac IR and Dr. Pamelia Hoit, oncology on board and patient was started on heparin drip.   IR followed the patient and the underwent mechanical thrombectomy on May 16, 2019.   I had spoken with Dr.  [facility] who recommended Lovenox as a choice of anticoagulation and consider imaging if hemoglobin dropped.. Hemoglobin trend of 7.4>8.3>7.8>7.9 so will consider lovenox q 12 and discontinue heparin drip.  Continue to monitor hemoglobin trend.  Submassive bilateral PE with right-sided heart strain by CT, diagnosed previous admission (discharged 05/02/2019).   On heparin drip, s/p mechanical thrombectomy, patient has IVC filter in place.  Change to lovenox.Marland Kitchen  Extensive large pelvic hematoma on previous hospitalization (discharged 12/17) displacing bowel and compressing bladder, likely originating from left rectus abdominis muscle.  Patient has been moving bowels with laxatives.  No significant interval change on CT 12/27 compared to previous study.   Renal function normal with creatinine of 0.9. Change to lovenox.  Repeat imaging if hemoglobin drifts down significantly.  Thrombocytopenia,    resolved  Hyponatremia.   Resolved sodium of 137.  Mild hypokalemia.  Will replace orally.  Check BMP in a.m.  Latest magnesium of 1.8.  Constipation.    Improved .  Continue bowel regimen.  Diabetes mellitus type 2 -Continue sliding scale insulin, Lantus, diabetic diet.  Accu-Cheks..  Closely monitor.  Last POC glucose of 128.  Status post COVID-19 viral  pneumonia treated with remdesivir and Decadron with discharge 12/17.  Initially Covid + 04/15/2019.  VTE Prophylaxis: Heparin drip> changed to Lovenox  Code Status: Full code  Family Communication:  None today.  Disposition Plan:  Skilled nursing facility as per PT evaluation. Will need continued PT.Social worker consulted.  Consultants:  Hematooncology  Interventional radiology  Vascular surgery  Procedures: Mechanical thrombectomy May 16, 2019.  Antibiotics: Anti-infectives (From admission, onward)   Start     Dose/Rate Route Frequency Ordered Stop   05/17/19 2100  ceFEPIme (MAXIPIME) 2 g in sodium chloride 0.9 % 100 mL IVPB     2 g 200 mL/hr over 30 Minutes Intravenous Every 12 hours 05/17/19 1230 05/19/19 1031   05/12/19 0000  vancomycin (VANCOCIN) IVPB 1000 mg/200 mL premix  Status:  Discontinued     1,000 mg 200 mL/hr over 60 Minutes Intravenous Every 24 hours 05/11/19 0108 05/14/19 1052   05/11/19 1200  ceFEPIme (MAXIPIME) 2 g in sodium chloride 0.9 % 100 mL IVPB  Status:  Discontinued     2 g 200 mL/hr over 30 Minutes Intravenous Every 12 hours 05/11/19 0611 05/17/19 1243   05/11/19 0115  vancomycin (VANCOREADY) IVPB 2000 mg/400 mL     2,000 mg 200 mL/hr over 120 Minutes Intravenous  Once 05/11/19 0100 05/11/19 0350   05/11/19 0100  ceFEPIme (MAXIPIME) 1 g in sodium chloride 0.9 % 100 mL IVPB     1 g 200 mL/hr over 30 Minutes Intravenous  Once 05/11/19 0054 05/11/19 0443     Subjective: Today, patient complains of mild soreness in the mouth.  Leg pain especially on touch.  She denies any chest pain, shortness of breath, fever or cough.  Has been having bowel movements.  Objective: Vitals:   05/19/19 0318 05/19/19 0800  BP: (!) 104/29   Pulse: 97 99  Resp: 18 (!) 25  Temp: 99.1 F (37.3 C)   SpO2: 100% 98%    Intake/Output Summary (Last 24 hours) at 05/19/2019 1141 Last data filed at 05/19/2019 0356 Gross per 24 hour  Intake 566.73 ml  Output 1700  ml  Net -1133.27 ml   Filed Weights   05/10/19 2352  Weight: 106.6 kg   Body mass index is 39.11 kg/m.   Physical Exam: General: Morbidly obese, not in obvious distress HENT: Normocephalic, pupils equally reacting to light and accommodation.  No scleral pallor or icterus noted. Oral mucosa is moist.  Chest:    Diminished breath sounds bilaterally. No crackles or wheezes.  CVS: S1 &S2 heard. No murmur.  Regular rate and rhythm. Abdomen: Soft, nontender, nondistended.  Bowel sounds are heard.  Extremities: No cyanosis, clubbing but with bilateral lower extremity pitting edema with tenderness.  Peripheral pulses are palpable. Psych: Alert, awake and oriented, normal mood CNS:  No cranial nerve deficits.  Power equal in all extremities.  No sensory deficits noted.  No cerebellar signs.   Skin: Warm and dry.  No rashes noted.  Data Review: I have personally reviewed the following laboratory data and studies,  CBC: Recent Labs  Lab 05/16/19 2335 05/17/19 0221 05/17/19 0841 05/18/19 0249 05/19/19 0418  WBC 12.9* 13.9* 12.1* 12.1* 11.3*  NEUTROABS 8.1*  --  8.7*  --   --   HGB 7.8* 7.4* 8.3* 7.8* 7.9*  HCT 24.9* 24.2* 26.8* 24.8* 25.3*  MCV 87.4 88.3 89.0 87.3 87.5  PLT 186 180 185 173  188   Basic Metabolic Panel: Recent Labs  Lab 05/14/19 0756 05/14/19 2350 05/16/19 0230 05/18/19 0249 05/19/19 0418  NA 128* 128* 131* 134* 137  K 4.8 4.8 4.3 3.8 3.4*  CL 100 102 104 110 112*  CO2 17* 19* 19* 16* 18*  GLUCOSE 227* 240* 177* 194* 127*  BUN 29* 25* 20 13 9   CREATININE 0.91 0.90 0.85 0.76 0.73  CALCIUM 9.1 8.8* 8.7* 8.0* 8.3*  MG  --   --  2.0 1.8 1.8   Liver Function Tests: Recent Labs  Lab 05/16/19 0230 05/19/19 0418  AST 24 27  ALT 16 12  ALKPHOS 87 67  BILITOT 1.0 0.7  PROT 6.4* 5.3*  ALBUMIN 2.6* 2.0*   No results for input(s): LIPASE, AMYLASE in the last 168 hours. No results for input(s): AMMONIA in the last 168 hours. Cardiac Enzymes: No results for  input(s): CKTOTAL, CKMB, CKMBINDEX, TROPONINI in the last 168 hours. BNP (last 3 results) Recent Labs    04/24/19 0500 05/10/19 2358  BNP 193.7* 20.0    ProBNP (last 3 results) No results for input(s): PROBNP in the last 8760 hours.  CBG: Recent Labs  Lab 05/18/19 0746 05/18/19 1135 05/18/19 1643 05/18/19 2143 05/19/19 0603  GLUCAP 146* 177* 155* 157* 128*   Recent Results (from the past 240 hour(s))  Culture, blood (routine x 2) Call MD if unable to obtain prior to antibiotics being given     Status: None   Collection Time: 05/11/19  1:22 AM   Specimen: BLOOD LEFT HAND  Result Value Ref Range Status   Specimen Description BLOOD LEFT HAND  Final   Special Requests   Final    BOTTLES DRAWN AEROBIC AND ANAEROBIC Blood Culture adequate volume   Culture   Final    NO GROWTH 5 DAYS Performed at [facility], [address] [place], [place]    Report Status 05/16/2019 FINAL  Final  Culture, blood (routine x 2) Call MD if unable to obtain prior to antibiotics being given     Status: None   Collection Time: 05/11/19  3:16 AM   Specimen: BLOOD LEFT HAND  Result Value Ref Range Status   Specimen Description BLOOD LEFT HAND  Final   Special Requests   Final    BOTTLES DRAWN AEROBIC ONLY Blood Culture results may not be optimal due to an inadequate volume of blood received in culture bottles   Culture   Final    NO GROWTH 5 DAYS Performed at [facility], [address], [place], [place]    Report Status 05/16/2019 FINAL  Final  Surgical PCR screen     Status: None   Collection Time: 05/16/19  6:09 AM   Specimen: Nasal Mucosa; Nasal Swab  Result Value Ref Range Status   MRSA, PCR NEGATIVE NEGATIVE Final   Staphylococcus aureus NEGATIVE NEGATIVE Final    Comment: (NOTE) The Xpert SA Assay (FDA approved for NASAL specimens in patients 80 years of age and older), is one component of a comprehensive surveillance program. It is not intended to diagnose  infection nor to guide or monitor treatment. Performed at [facility], [address] [place], [place]      Studies: No results found.  Scheduled Meds: . ascorbic acid  500 mg Oral Daily  . cholecalciferol  1,000 Units Oral Daily  . dextromethorphan-guaiFENesin  1 tablet Oral BID  . feeding supplement (GLUCERNA SHAKE)  237 mL Oral TID BM  .  insulin aspart  0-15 Units Subcutaneous TID WC  . insulin aspart  0-5 Units Subcutaneous QHS  . insulin glargine  10 Units Subcutaneous Daily  . loratadine  10 mg Oral Daily  . pantoprazole  40 mg Oral Daily  . polyethylene glycol  17 g Oral BID  . potassium chloride  40 mEq Oral Once  . senna-docusate  2 tablet Oral BID  . umeclidinium bromide  1 puff Inhalation Daily  . zinc sulfate  220 mg Oral Daily    Continuous Infusions: . heparin 1,550 Units/hr (05/19/19 0059)    Flora Lipps, MD  [facility] 05/19/2019

## 2019-05-19 NOTE — TOC Initial Note (Addendum)
Transition of Care [facility]) - Initial/Assessment Note    Patient Details  Name: Regina Munoz MRN: [id number] Date of Birth: 01-26-1951  [facility]) CM/SW Contact:    Bary Castilla, LCSW Phone Number: [phone] 05/19/2019, 10:45 AM  Clinical Narrative:                  CSW met with patient ad patient's spouse  to discuss PT recommendation of a SNF. Patient was aware of recommendation and in somewhat of an agreement with going to a ST SNF. Patient wanted to know about services that could be offered at home.CSW discussed the SNF process.CSW provided patient with medicare.gov rating list. .Patient admitted that her mobility is not where it should be so agreed to allow CSW to faxed out to certain facilities on the medicare.gov list.  CSW answered questions about the SNF process and the next steps in the process.  Patient and patient's husband informed CSW that patient's mobility gets better they would like her to come home with [facility]. CSW explained that they could change their minds if they chose to if this occurred.  11:32: Attempted to call [employer] to start authorization and no one answered.  TOC team will continue to follow for discharge planning needs. .  Expected Discharge Plan: Skilled Nursing Facility Barriers to Discharge: Insurance Authorization, Continued Medical Work up, SNF Pending bed offer   Patient Goals and CMS Choice Patient states their goals for this hospitalization and ongoing recovery are:: To get better to go home CMS Medicare.gov Compare Post Acute Care list provided to:: Patient Choice offered to / list presented to : Patient  Expected Discharge Plan and Services Expected Discharge Plan: [place]       Living arrangements for the past 2 months: Single Family Home                                      Prior Living Arrangements/Services Living arrangements for the past 2 months: Single Family Home Lives with:: Self,  Spouse Patient language and need for interpreter reviewed:: Yes                 Activities of Daily Living Home Assistive Devices/Equipment: None ADL Screening (condition at time of admission) Patient's cognitive ability adequate to safely complete daily activities?: Yes Is the patient deaf or have difficulty hearing?: No Does the patient have difficulty seeing, even when wearing glasses/contacts?: No Does the patient have difficulty concentrating, remembering, or making decisions?: No Patient able to express need for assistance with ADLs?: Yes Does the patient have difficulty dressing or bathing?: No Independently performs ADLs?: Yes (appropriate for developmental age) Grooming: Needs assistance Is this a change from baseline?: Pre-admission baseline Feeding: Independent Bathing: Needs assistance Does the patient have difficulty walking or climbing stairs?: Yes Weakness of Legs: Both Weakness of Arms/Hands: None  Permission Sought/Granted      Share Information with NAME: Nathaneil Canary  Permission granted to share info w AGENCY: SNFs  Permission granted to share info w Relationship: Spouse  Permission granted to share info w Contact Information: [phone]  Emotional Assessment Appearance:: Appears stated age Attitude/Demeanor/Rapport: Engaged, Self-Confident Affect (typically observed): Accepting Orientation: : Oriented to Self, Oriented to  Time, Oriented to Place, Oriented to Situation      Admission diagnosis:  SIRS (systemic inflammatory response syndrome) (HCC) [R65.10] AKI (acute kidney injury) ([place]) [N17.9]  HCAP (healthcare-associated pneumonia) [J18.9] Patient Active Problem List   Diagnosis Date Noted  . Severe sepsis ([place]) 05/13/2019  . Lobar pneumonia ([place]) 05/13/2019  . SIRS (systemic inflammatory response syndrome) ([place]) 05/11/2019  . DVT (deep venous thrombosis) ([place]) 05/11/2019  . Anxiety 05/11/2019  . Depression 05/11/2019  . Asthma 05/11/2019  .  Lactic acidosis 05/11/2019  . Hypoxia 05/02/2019  . DM type 2 without retinopathy ([place])   . Bilateral pulmonary embolism ([place])   . Pneumonia due to COVID-19 virus 04/21/2019  . Neck pain 02/17/2016   PCP:  Scotty Court, DO Pharmacy:   [facility] [place] [place] [address] Phone: [phone] Fax: [facility] [address], [place] [address] Phone: [phone] Fax: [phone]     Social Determinants of Health (SDOH) Interventions    Readmission Risk Interventions No flowsheet data found.

## 2019-05-19 NOTE — Progress Notes (Addendum)
ANTICOAGULATION CONSULT NOTE - Follow Up Consult  Pharmacy Consult for enoxaparin Indication: BL PE with R heart strain and LLE DVT   No Known Allergies  Patient Measurements: Height: 5\' 5"  (165.1 cm) Weight: 235 lb (106.6 kg) IBW/kg (Calculated) : 57 Heparin Dosing Weight: 81.8 kg  Vital Signs: Temp: 99.1 F (37.3 C) (01/03 0318) Temp Source: Oral (01/03 0318) BP: 104/29 (01/03 0318) Pulse Rate: 99 (01/03 0800)  Labs: Recent Labs    05/17/19 0841 05/18/19 0249 05/18/19 1556 05/19/19 0418  HGB 8.3* 7.8*  --  7.9*  HCT 26.8* 24.8*  --  25.3*  PLT 185 173  --  188  HEPARINUNFRC 0.63 0.27* 0.32 0.30  CREATININE  --  0.76  --  0.73    Estimated Creatinine Clearance: 82.7 mL/min (by C-G formula based on SCr of 0.73 mg/dL).  Assessment: 37 yof with submassive BL PE with R heart strain and LLE DVT (hx of COVID 03/2019), had IVC filter placed 12/14. No anticoagulation PTA. Patient's hemoglobin has been steadily decreasing requiring transfusions. Per hem/onc MD continue anticoagulation even with hemoglobin downward trend. Patient has a hx of recent pelvic hematoma and is s/p thrombectomy on 12/21. MD would like to transition from heparin to enoxaparin.  Heparin level is therapeutic at 0.3 this morning on heparin rate of 1500. Level drawn appropriately and no signs/symptoms of bleeding or issues for infusion. Hg has stabilized ~ 7.9. Plts are wnls   Goal of Therapy:  Heparin level 0.3-0.5 units/ml Monitor platelets by anticoagulation protocol: Yes   Plan:  Stop heparin infusion. Start enoxaparin SQ 100 mg BID Monitor CBC daily Monitor for signs of bleeding  Follow up for transition to oral anticoagulation   Thank you,  1/22, PharmD PGY1 Acute Care Pharmacy Resident Please check amion for clinical pharmacist contact number

## 2019-05-19 NOTE — NC FL2 (Signed)
[place] LEVEL OF CARE SCREENING TOOL     IDENTIFICATION  Patient Name: Regina Munoz Birthdate: 08-04-1950 Sex: female Admission Date (Current Location): 05/10/2019  [facility] and [place] Number:  Herbalist and Address:  [facility], [place] [address], [place], [place] [address]      Provider Number: [id number]  Attending Physician Name and Address:  Flora Lipps, MD  Relative Name and Phone Number:  Nathaneil Canary [phone]    Current Level of Care: Hospital Recommended Level of Care: [facility] Prior Approval Number:    Date Approved/Denied:   PASRR Number: Pending  Discharge Plan: SNF    Current Diagnoses: Patient Active Problem List   Diagnosis Date Noted  . Severe sepsis ([place]) 05/13/2019  . Lobar pneumonia ([place]) 05/13/2019  . SIRS (systemic inflammatory response syndrome) ([place]) 05/11/2019  . DVT (deep venous thrombosis) ([place]) 05/11/2019  . Anxiety 05/11/2019  . Depression 05/11/2019  . Asthma 05/11/2019  . Lactic acidosis 05/11/2019  . Hypoxia 05/02/2019  . DM type 2 without retinopathy ([place])   . Bilateral pulmonary embolism ([place])   . Pneumonia due to COVID-19 virus 04/21/2019  . Neck pain 02/17/2016    Orientation RESPIRATION BLADDER Height & Weight     Self, Time, Situation, Place  Normal Incontinent, External catheter Weight: 235 lb (106.6 kg) Height:  5\' 5"  (165.1 cm)  BEHAVIORAL SYMPTOMS/MOOD NEUROLOGICAL BOWEL NUTRITION STATUS      Continent (see discharge summary)  AMBULATORY STATUS COMMUNICATION OF NEEDS Skin   Extensive Assist Verbally (Dry, MASD)                       Personal Care Assistance Level of Assistance  Bathing, Feeding, Dressing Bathing Assistance: Maximum assistance Feeding assistance: Independent Dressing Assistance: Maximum assistance     Functional Limitations Info  Sight, Hearing, Speech Sight Info: Adequate Hearing Info: Adequate Speech Info:  Adequate    SPECIAL CARE FACTORS FREQUENCY  PT (By licensed PT), OT (By licensed OT)     PT Frequency: 5x per week OT Frequency: 5x per week            Contractures Contractures Info: Not present    Additional Factors Info  Code Status, Allergies, Insulin Sliding Scale Code Status Info: Full Allergies Info: NKA   Insulin Sliding Scale Info: insulin glargine (LANTUS) injection 10 Uinsulin aspart (novoLOG) injection 0-15 Units nits,       Current Medications (05/19/2019):  This is the current hospital active medication list Current Facility-Administered Medications  Medication Dose Route Frequency Provider Last Rate Last Admin  . acetaminophen (TYLENOL) tablet 1,000 mg  1,000 mg Oral Q6H PRN Pokhrel, Laxman, MD   1,000 mg at 05/16/19 2149  . albuterol (VENTOLIN HFA) 108 (90 Base) MCG/ACT inhaler 2 puff  2 puff Inhalation Q4H PRN Adefeso, Oladapo, DO      . ascorbic acid (VITAMIN C) tablet 500 mg  500 mg Oral Daily Pokhrel, Laxman, MD   500 mg at 05/19/19 0919  . cholecalciferol (VITAMIN D3) tablet 1,000 Units  1,000 Units Oral Daily Pokhrel, Laxman, MD   1,000 Units at 05/19/19 0919  . dextromethorphan-guaiFENesin (MUCINEX DM) 30-600 MG per 12 hr tablet 1 tablet  1 tablet Oral BID Adefeso, Oladapo, DO   1 tablet at 05/19/19 0919  . feeding supplement (GLUCERNA SHAKE) (GLUCERNA SHAKE) liquid 237 mL  237 mL Oral TID BM Samuella Cota, MD   237 mL at  05/19/19 0920  . heparin ADULT infusion 100 units/mL (25000 units/264mL sodium chloride 0.45%)  1,550 Units/hr Intravenous Continuous Alvia Grove D, RPH 15.5 mL/hr at 05/19/19 0059 1,550 Units/hr at 05/19/19 0059  . heparin injection    PRN Gilmer Mor, DO   4,000 Units at 05/16/19 1605  . insulin aspart (novoLOG) injection 0-15 Units  0-15 Units Subcutaneous TID WC Adefeso, Oladapo, DO   2 Units at 05/19/19 0918  . insulin aspart (novoLOG) injection 0-5 Units  0-5 Units Subcutaneous QHS Adefeso, Oladapo, DO   2 Units at 05/15/19  2155  . insulin glargine (LANTUS) injection 10 Units  10 Units Subcutaneous Daily Pokhrel, Laxman, MD   10 Units at 05/19/19 0919  . lidocaine (XYLOCAINE) 1 % (with pres) injection   Infiltration PRN Gilmer Mor, DO   20 mL at 05/16/19 1649  . loratadine (CLARITIN) tablet 10 mg  10 mg Oral Daily Pokhrel, Laxman, MD   10 mg at 05/19/19 0919  . ondansetron (ZOFRAN) injection 4 mg  4 mg Intravenous Q6H PRN Bodenheimer, Charles A, NP   4 mg at 05/11/19 2004  . pantoprazole (PROTONIX) EC tablet 40 mg  40 mg Oral Daily Adefeso, Oladapo, DO   40 mg at 05/19/19 0919  . polyethylene glycol (MIRALAX / GLYCOLAX) packet 17 g  17 g Oral BID Standley Brooking, MD   17 g at 05/16/19 2149  . potassium chloride SA (KLOR-CON) CR tablet 40 mEq  40 mEq Oral Once Pokhrel, Laxman, MD      . senna-docusate (Senokot-S) tablet 2 tablet  2 tablet Oral BID Darty Singer, MD   2 tablet at 05/16/19 2149  . umeclidinium bromide (INCRUSE ELLIPTA) 62.5 MCG/INH 1 puff  1 puff Inhalation Daily Adefeso, Oladapo, DO   1 puff at 05/19/19 0800  . zinc sulfate capsule 220 mg  220 mg Oral Daily Pokhrel, Laxman, MD   220 mg at 05/19/19 0920     Discharge Medications: Please see discharge summary for a list of discharge medications.  Relevant Imaging Results:  Relevant Lab Results:   Additional Information SS# [id number]  Patrice [place], [place]

## 2019-05-19 NOTE — Progress Notes (Signed)
Pt's leg dressings changed per MD orders without difficulty.  Will continue to monitor.

## 2019-05-20 LAB — BASIC METABOLIC PANEL
Anion gap: 8 (ref 5–15)
BUN: 8 mg/dL (ref 8–23)
CO2: 19 mmol/L — ABNORMAL LOW (ref 22–32)
Calcium: 8.3 mg/dL — ABNORMAL LOW (ref 8.9–10.3)
Chloride: 111 mmol/L (ref 98–111)
Creatinine, Ser: 0.76 mg/dL (ref 0.44–1.00)
GFR calc Af Amer: >60 mL/min (ref >60)
GFR calc non Af Amer: >60 mL/min (ref >60)
Glucose, Bld: 139 mg/dL — ABNORMAL HIGH (ref 70–99)
Potassium: 4.1 mmol/L (ref 3.5–5.1)
Sodium: 138 mmol/L (ref 135–145)

## 2019-05-20 LAB — CBC
HCT: 26.1 % — ABNORMAL LOW (ref 36.0–46.0)
Hemoglobin: 7.9 g/dL — ABNORMAL LOW (ref 12.0–15.0)
MCH: 27.1 pg (ref 26.0–34.0)
MCHC: 30.3 g/dL (ref 30.0–36.0)
MCV: 89.7 fL (ref 80.0–100.0)
Platelets: 188 10*3/uL (ref 150–400)
RBC: 2.91 MIL/uL — ABNORMAL LOW (ref 3.87–5.11)
RDW: 20.4 % — ABNORMAL HIGH (ref 11.5–15.5)
WBC: 9.8 10*3/uL (ref 4.0–10.5)
nRBC: 0 % (ref 0.0–0.2)

## 2019-05-20 LAB — GLUCOSE, CAPILLARY
Glucose-Capillary: 147 mg/dL — ABNORMAL HIGH (ref 70–99)
Glucose-Capillary: 165 mg/dL — ABNORMAL HIGH (ref 70–99)
Glucose-Capillary: 135 mg/dL — ABNORMAL HIGH (ref 70–99)

## 2019-05-20 LAB — MAGNESIUM: Magnesium: 1.8 mg/dL (ref 1.7–2.4)

## 2019-05-20 MED ORDER — ADULT MULTIVITAMIN W/MINERALS CH
1.0000 | ORAL_TABLET | Freq: Every day | ORAL | Status: DC
  Administered 2019-05-20 – 2019-05-21 (×2): 1 via ORAL
  Filled 2019-05-20 (×2): qty 1

## 2019-05-20 NOTE — Progress Notes (Addendum)
PROGRESS NOTE  Regina Munoz ZRA:[id number] DOB: 1951-04-14 DOA: 05/10/2019 PCP: [name], DO   LOS: 9 days   Brief narrative: 69 year old woman discharged on 12/17 following treatment for Covid pneumonia diagnosed 11/30, hospital course complicated by worsening hypoxemia leading to CTA which revealed massive bilateral pulmonary embolism with RV strain, started on heparin infusion, then developed abdominal pain and CT revealed spontaneous left pelvic hematoma.  IR consulted for possible embolization but patient was stabilized after transfusion.  Left lower extremity venous ultrasound showed DVT.  IR was consulted and IVC filter placed 12/14.  She was subsequently discharged home on oxygen, no anticoagulation.  She was seen both by interventional radiology and pulmonology during that hospitalization.  Patient then presented on 12/25 with increasing fatigue.  Patient was admitted for severe sepsis from pneumonia, subsequently complained of increasing lower extremity edema and pain, ultrasound revealed extensive bilateral lower extremity DVTs with progression with concern for involvement into the pelvis and even to IVC filter.  She was transferred to [facility] for further evaluation.  She was seen by vascular surgery, but in the absence of anticoagulation, no intervention was recommended.  Interventional radiology and hematology were subsequently consulted and patient was started on heparin drip.  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  Patient presented with with tachycardia, tachypnea and lactic acidosis with acute hypoxic respiratory failure. Chest  x-ray with opacity of the left lung base.    Patient was empirically started on vancomycin and cefepime due to history of recent Covid pneumonia. -- MRSA PCR was negative on December 8.    Completed 7-day course of antibiotic.  Blood cultures negative at 5 days.  Patient is on room air saturating 96% today.  Extensive, progressive bilateral lower extremity DVT with concern for progression to the iliac and IVC with history of IVC filter. Dr. Grace Isaac IR and Dr. Pamelia Hoit, oncology on board and patient was started on heparin drip.   IR followed the patient and the underwent mechanical thrombectomy on May 16, 2019.   I had spoken with Dr.  [facility] who recommended Lovenox as a choice of anticoagulation and consider imaging if hemoglobin dropped.. Hemoglobin trend of 7.4>8.3>7.8>7.9 so will consider lovenox q 12 and discontinue heparin drip.  Continue to monitor hemoglobin trend.  Please Dr. Myna Hidalgo oncology discussed regarding long-term anticoagulation possibly with oral options.  Hematology to  follow-up regarding recommendations  Submassive bilateral PE with right-sided heart strain by CT, diagnosed previous admission (discharged 05/02/2019).   On heparin drip, s/p mechanical thrombectomy, patient has IVC filter in place.  Changed to lovenox and tolerating well...  Extensive large pelvic hematoma on previous hospitalization (discharged 12/17) displacing bowel and compressing bladder, likely originating from left rectus abdominis muscle.   No significant interval change on CT 12/27 compared to previous study.   Renal function normal with creatinine of 0.7.  On Lovenox therapeutic dose.  Repeat imaging if hemoglobin drifts down significantly.  Thrombocytopenia,    resolved  Hyponatremia.   Resolved, sodium of 138.  Mild hypokalemia.  Improved with replacement.  Constipation.    Improved .  Continue bowel regimen.  Diabetes mellitus type 2 -  Continue sliding scale insulin, Lantus, diabetic diet.   Accu-Cheks..  Closely monitor.  Last POC glucose of 165.  Status post COVID-19 viral pneumonia treated with remdesivir and Decadron with discharge 12/17.  Initially Covid + 04/15/2019.  VTE Prophylaxis: Therapeutic Lovenox  Code Status: Full code  Family Communication:  None today.  Disposition Plan:  Skilled nursing facility as per PT evaluation. Will need continued PT. Social worker on board.  At this time, patient is stable for disposition to skilled nursing facility.  Addendum:  05/20/2019 4:52 PM  Texted oncology Dr Twanna Hy to guide me through oral anticoagulation option on discharge.  Spoke with the patient's husband Riley Lam on the phone and updated him about the clinical condition of the patient.  PT has seen the patient today and recommended home PT.  Patient's husband would like her to be discharged home if possible.  We will plan for disposition home likely tomorrow.  Consultants:  Hematooncology  Interventional radiology  Vascular surgery  Procedures: Mechanical thrombectomy May 16, 2019.  Antibiotics: Anti-infectives (From admission, onward)   Start     Dose/Rate Route Frequency Ordered Stop   05/17/19 2100  ceFEPIme (MAXIPIME) 2 g in sodium chloride 0.9 % 100 mL IVPB     2 g 200 mL/hr over 30 Minutes Intravenous Every 12 hours 05/17/19 1230 05/20/19 0119   05/12/19 0000  vancomycin (VANCOCIN) IVPB 1000 mg/200 mL premix  Status:  Discontinued     1,000 mg 200 mL/hr over 60 Minutes Intravenous Every 24 hours 05/11/19 0108 05/14/19 1052   05/11/19 1200  ceFEPIme (MAXIPIME) 2 g in sodium chloride 0.9 % 100 mL IVPB  Status:  Discontinued     2 g 200 mL/hr over 30 Minutes Intravenous Every 12 hours 05/11/19 0611 05/17/19 1243   05/11/19 0115  vancomycin (VANCOREADY) IVPB 2000 mg/400 mL     2,000 mg 200 mL/hr over 120 Minutes Intravenous  Once 05/11/19 0100 05/11/19 0350   05/11/19 0100  ceFEPIme (MAXIPIME) 1 g in sodium chloride 0.9 % 100 mL IVPB     1 g 200  mL/hr over 30 Minutes Intravenous  Once 05/11/19 0054 05/11/19 0443     Subjective: Today, patient denies shortness of breath, chest pain, dizziness lightheadedness.  Minimal leg pain.  Has been moving bowels.  Objective: Vitals:   05/20/19 0415 05/20/19 0844  BP: 102/64   Pulse: 94   Resp: 19   Temp: 98.8 F (37.1 C)   SpO2: 97% 96%    Intake/Output Summary (Last 24 hours) at 05/20/2019 1313 Last data filed at 05/20/2019 0415 Gross per 24 hour  Intake 340 ml  Output 2130 ml  Net -1790 ml   Filed Weights   05/10/19 2352  Weight: 106.6 kg   Body mass index is 39.11 kg/m.   Physical Exam: General: Morbidly obese, not in obvious distress HENT: Normocephalic, pupils equally reacting to light and accommodation.  No scleral pallor or icterus noted. Oral mucosa is moist.  Chest:    Diminished breath sounds bilaterally. No crackles or wheezes.  CVS: S1 &S2 heard. No murmur.  Regular rate and rhythm. Abdomen: Soft, nontender, nondistended.  Bowel sounds are heard.  Extremities: No cyanosis, clubbing but with bilateral lower extremity pitting edema with tenderness.  Peripheral pulses are palpable. Psych: Alert, awake and oriented, normal mood CNS:  No cranial nerve deficits.  Power equal in all extremities.  No sensory deficits noted.  No cerebellar signs.   Skin: Warm and dry.  No rashes noted.  Data Review: I have personally reviewed the following laboratory data and studies,  CBC: Recent Labs  Lab 05/16/19 2335 05/17/19 0221 05/17/19 0841 05/18/19 0249 05/19/19 0418 05/20/19 0405  WBC 12.9* 13.9* 12.1* 12.1* 11.3* 9.8  NEUTROABS 8.1*  --  8.7*  --   --   --   HGB 7.8* 7.4* 8.3* 7.8* 7.9* 7.9*  HCT 24.9* 24.2* 26.8* 24.8* 25.3* 26.1*  MCV 87.4 88.3 89.0 87.3 87.5 89.7  PLT 186 180 185 173 188 355   Basic Metabolic Panel: Recent Labs  Lab 05/14/19 2350 05/16/19 0230 05/18/19 0249 05/19/19 0418 05/20/19 0405  NA 128* 131* 134* 137 138  K 4.8 4.3 3.8 3.4* 4.1  CL  102 104 110 112* 111  CO2 19* 19* 16* 18* 19*  GLUCOSE 240* 177* 194* 127* 139*  BUN 25* 20 13 9 8   CREATININE 0.90 0.85 0.76 0.73 0.76  CALCIUM 8.8* 8.7* 8.0* 8.3* 8.3*  MG  --  2.0 1.8 1.8 1.8   Liver Function Tests: Recent Labs  Lab 05/16/19 0230 05/19/19 0418  AST 24 27  ALT 16 12  ALKPHOS 87 67  BILITOT 1.0 0.7  PROT 6.4* 5.3*  ALBUMIN 2.6* 2.0*   No results for input(s): LIPASE, AMYLASE in the last 168 hours. No results for input(s): AMMONIA in the last 168 hours. Cardiac Enzymes: No results for input(s): CKTOTAL, CKMB, CKMBINDEX, TROPONINI in the last 168 hours. BNP (last 3 results) Recent Labs    04/24/19 0500 05/10/19 2358  BNP 193.7* 20.0    ProBNP (last 3 results) No results for input(s): PROBNP in the last 8760 hours.  CBG: Recent Labs  Lab 05/19/19 1147 05/19/19 1644 05/19/19 2142 05/20/19 0614 05/20/19 1119  GLUCAP 201* 180* 157* 147* 165*   Recent Results (from the past 240 hour(s))  Culture, blood (routine x 2) Call MD if unable to obtain prior to antibiotics being given     Status: None   Collection Time: 05/11/19  1:22 AM   Specimen: BLOOD LEFT HAND  Result Value Ref Range Status   Specimen Description BLOOD LEFT HAND  Final   Special Requests   Final    BOTTLES DRAWN AEROBIC AND ANAEROBIC Blood Culture adequate volume   Culture   Final    NO GROWTH 5 DAYS Performed at [facility], [address], [place], [place] [address]    Report Status 05/16/2019 FINAL  Final  Culture, blood (routine x 2) Call MD if unable to obtain prior to antibiotics being given     Status: None   Collection Time: 05/11/19  3:16 AM   Specimen: BLOOD LEFT HAND  Result Value Ref Range Status   Specimen Description BLOOD LEFT HAND  Final   Special Requests   Final    BOTTLES DRAWN AEROBIC ONLY Blood Culture results may not be optimal due to an inadequate volume of blood received in culture bottles   Culture   Final    NO GROWTH 5 DAYS Performed at [facility], [address], [place],  [address]    Report Status 05/16/2019 FINAL  Final  Surgical PCR screen     Status: None   Collection Time: 05/16/19  6:09 AM   Specimen: Nasal Mucosa; Nasal Swab  Result Value Ref Range Status   MRSA, PCR NEGATIVE NEGATIVE Final   Staphylococcus aureus NEGATIVE NEGATIVE Final    Comment: (NOTE) The Xpert SA Assay (FDA approved for NASAL specimens in patients 29 years of age and  older), is one component of a comprehensive surveillance program. It is not intended to diagnose infection nor to guide or monitor treatment. Performed at [facility], [address], [place], [place] [address]      Studies: No results found.  Scheduled Meds: . ascorbic acid  500 mg Oral Daily  . cholecalciferol  1,000 Units Oral Daily  . dextromethorphan-guaiFENesin  1 tablet Oral BID  . enoxaparin (LOVENOX) injection  100 mg Subcutaneous Q12H  . feeding supplement (GLUCERNA SHAKE)  237 mL Oral TID BM  . insulin aspart  0-15 Units Subcutaneous TID WC  . insulin aspart  0-5 Units Subcutaneous QHS  . insulin glargine  10 Units Subcutaneous Daily  . loratadine  10 mg Oral Daily  . nystatin  5 mL Oral QID  . pantoprazole  40 mg Oral Daily  . polyethylene glycol  17 g Oral BID  . potassium chloride  40 mEq Oral Daily  . senna-docusate  2 tablet Oral BID  . umeclidinium bromide  1 puff Inhalation Daily  . zinc sulfate  220 mg Oral Daily    Continuous Infusions:   Joycelyn Das, MD  [facility] 05/20/2019

## 2019-05-20 NOTE — Progress Notes (Signed)
Brief Hematology Note:  Chart has been reviewed. Has transitioned from heparin to Lovenox. S/P mechanical thrombectomy of IVC, bilateral iliac veins, and bilateral femoral veins on 05/16/2019. Hemoglobin has ben stable for the past 3 days. Recommend continuing Lovenox at the current dose. Will sign off. Please reconsult if needed.   Clenton Pare, DNP, AGPCNP-BC, AOCNP

## 2019-05-20 NOTE — Progress Notes (Signed)
Physical Therapy Treatment Patient Details Name: Regina Munoz MRN: [id number] DOB: [birth year] Today's Date: 05/20/2019    History of Present Illness 69 year old woman discharged on 12/17 following treatment for Covid pneumonia diagnosed 11/30, hospital course complicated by worsening hypoxemia leading to CTA which revealed massive bilateral pulmonary embolism with RV strain, started on heparin infusion, then developed abdominal pain and CT revealed spontaneous left pelvic hematoma. Pt d/c home and returned on 12/25 with progressive fatigue, admitted with sepsis 2/2 PNA and found to have multiple DVTs. She is s/p thrombectomy.    PT Comments    Pt demonstrating good progress today.  Pt and spouse are declining SNF - report they have good strong 24 hr support and want to go home.  Pt was able to progress today to the level of min guard to min A for transfers and short distance ambulation for transfers.  If going home recommend w/c, hospital bed, and transportation home at this point - pt and spouse in agreement.    Follow Up Recommendations  Home health PT;Supervision/Assistance - 24 hour     Equipment Recommendations  Wheelchair (measurements PT);Hospital bed(Transportation home)    Recommendations for Other Services       Precautions / Restrictions Precautions Precautions: Fall    Mobility  Bed Mobility Overal bed mobility: Needs Assistance Bed Mobility: Rolling;Supine to Sit Rolling: Supervision   Supine to sit: Supervision;HOB elevated     General bed mobility comments: used rails; HOB elevated to only 20 degrees  Transfers Overall transfer level: Needs assistance Equipment used: Rolling walker (2 wheeled) Transfers: Sit to/from Stand Sit to Stand: Min assist         General transfer comment: Performed sit to stand x 4, cues to push up;  toileting ADLs with max A (due to IV)  Ambulation/Gait Ambulation/Gait assistance: Min guard Gait Distance (Feet): 8  Feet Assistive device: Rolling walker (2 wheeled) Gait Pattern/deviations: Step-through pattern;Decreased stride length;Wide base of support Gait velocity: decr   General Gait Details: Ambulated 8' and then 3'x2 ; rest breaks; cues for posutre; chair follow; fatigue easily   Stairs             Wheelchair Mobility    Modified Rankin (Stroke Patients Only)       Balance Overall balance assessment: Needs assistance Sitting-balance support: Single extremity supported;Feet supported Sitting balance-Leahy Scale: Good     Standing balance support: Bilateral upper extremity supported;During functional activity Standing balance-Leahy Scale: Fair                              Cognition Arousal/Alertness: Awake/alert Behavior During Therapy: WFL for tasks assessed/performed Overall Cognitive Status: Within Functional Limits for tasks assessed                                 General Comments: spouse present      Exercises      General Comments General comments (skin integrity, edema, etc.): Pt on RA VSS      Pertinent Vitals/Pain Pain Assessment: No/denies pain    Home Living                      Prior Function            PT Goals (current goals can now be found in the care plan section) Progress towards PT goals: Progressing toward goals  Frequency    Min 3X/week      PT Plan Discharge plan needs to be updated;Frequency needs to be updated    Co-evaluation              AM-PAC PT "6 Clicks" Mobility   Outcome Measure  Help needed turning from your back to your side while in a flat bed without using bedrails?: None Help needed moving from lying on your back to sitting on the side of a flat bed without using bedrails?: A Little Help needed moving to and from a bed to a chair (including a wheelchair)?: A Little Help needed standing up from a chair using your arms (e.g., wheelchair or bedside chair)?: A  Little Help needed to walk in hospital room?: A Little Help needed climbing 3-5 steps with a railing? : Total 6 Click Score: 17    End of Session Equipment Utilized During Treatment: Gait belt Activity Tolerance: Patient tolerated treatment well Patient left: in chair;with call bell/phone within reach;with family/visitor present Nurse Communication: Mobility status PT Visit Diagnosis: Muscle weakness (generalized) (M62.81);Other abnormalities of gait and mobility (R26.89)     Time: 1610-9604 PT Time Calculation (min) (ACUTE ONLY): 30 min  Charges:  $Gait Training: 8-22 mins $Therapeutic Activity: 8-22 mins                     Royetta Asal, PT Acute Rehab Services Pager [phone] [facility] [phone] [facility] [phone]    Rayetta Humphrey 05/20/2019, 2:07 PM

## 2019-05-20 NOTE — Progress Notes (Signed)
Nutrition Follow up  DOCUMENTATION CODES:   Obesity unspecified  INTERVENTION:   Add MVI daily   -Continue Glucerna Shake po TID, each supplement provides 220 kcal and 10 grams of protein  NUTRITION DIAGNOSIS:   Increased nutrient needs related to acute illness(sepsis secondary to pneumonia) as evidenced by estimated needs.  Ongoing   GOAL:   Patient will meet greater than or equal to 90% of their needs   Progressing   MONITOR:   Labs, I & O's, Skin, Supplement acceptance, Weight trends, PO intake  REASON FOR ASSESSMENT:   Malnutrition Screening Tool    ASSESSMENT:  69 year old female with past medical history of asthma, T2DM, LLE DVT and PE s/p IVC filter, recent discharge from [facility] on 12/17 for COVID 19 who presented with complaints of progressive generalized weakness, cough, and nausea that began a few days after discharge. She reports worsening leg swelling and SOB on ambulating over the past 2 days. In ED, pt on 2L supplemental oxygen; CXR showed mildly increased airspace opacity at left lung base suspected to be due to atelectasis and/or early infectious etiology and admitted for further evaluation and management.   12/30- s/p thrombectomy IVC, bilat iliac/femoral veins   Pt reports appetite is finally progressing to normal. Metallic taste is not present anymore. Meal completions charted as 60-100% for her last 6 meals. Drinking Glucerna TID and would like to continue.   Admission weight: 106.6 kg- no recent weight obtained   I/O: +3,118 ml since admit UOP: 2,130 ml x 24 hrs   Medications: 500 mg Vit C, Vit D, SS novolog, lantus, 40 mEq KCl daily, senokot Labs: CBG 128-201  Diet Order:   Diet Order            Diet Carb Modified Fluid consistency: Thin; Room service appropriate? Yes  Diet effective now              EDUCATION NEEDS:   No education needs have been identified at this time  Skin:  Skin Assessment: Skin Integrity Issues: Skin Integrity  Issues:: Other (Comment) Other: puncture- L/R knee, throat  Last BM:  1/2  Height:   Ht Readings from Last 1 Encounters:  05/10/19 5\' 5"  (1.651 m)    Weight:   Wt Readings from Last 1 Encounters:  05/10/19 106.6 kg    Ideal Body Weight:  52.3 kg  BMI:  Body mass index is 39.11 kg/m.  Estimated Nutritional Needs:   Kcal:  1900-2100  Protein:  95-110  Fluid:  >/= 1.8 L/day   05/12/19 RD, LDN Clinical Nutrition Pager # - [phone]

## 2019-05-21 ENCOUNTER — Encounter (HOSPITAL_COMMUNITY): Payer: Self-pay

## 2019-05-21 LAB — GLUCOSE, CAPILLARY
Glucose-Capillary: 152 mg/dL — ABNORMAL HIGH (ref 70–99)
Glucose-Capillary: 125 mg/dL — ABNORMAL HIGH (ref 70–99)
Glucose-Capillary: 150 mg/dL — ABNORMAL HIGH (ref 70–99)

## 2019-05-21 LAB — CBC
HCT: 26.1 % — ABNORMAL LOW (ref 36.0–46.0)
Hemoglobin: 7.9 g/dL — ABNORMAL LOW (ref 12.0–15.0)
MCH: 27.1 pg (ref 26.0–34.0)
MCHC: 30.3 g/dL (ref 30.0–36.0)
MCV: 89.7 fL (ref 80.0–100.0)
Platelets: 194 10*3/uL (ref 150–400)
RBC: 2.91 MIL/uL — ABNORMAL LOW (ref 3.87–5.11)
RDW: 20.5 % — ABNORMAL HIGH (ref 11.5–15.5)
WBC: 9.3 10*3/uL (ref 4.0–10.5)
nRBC: 0.2 % (ref 0.0–0.2)

## 2019-05-21 MED ORDER — ENOXAPARIN SODIUM 100 MG/ML ~~LOC~~ SOLN: 100.0000 mg | Freq: Two times a day (BID) | SUBCUTANEOUS | 0 refills | Status: DC

## 2019-05-21 NOTE — Progress Notes (Cosign Needed)
    Durable Medical Equipment  (From admission, onward)         Start     Ordered   05/21/19 1438  For home use only DME lightweight manual wheelchair with seat cushion  Once    Comments: Patient suffers from debility, deconditioning, extensive deep vein thrombosis of the lower extremities, history of pulmonary embolism, diabetes mellitus, obesity  which impairs their ability to perform daily activities like bathing, grooming and toileting in the home.  A cane or walker will not resolve  issue with performing activities of daily living. A wheelchair will allow patient to safely perform daily activities. Patient is not able to propel themselves in the home using a standard weight wheelchair due to weakness. Patient can self propel in the lightweight wheelchair. Length of need 6 months. Accessories: elevating leg rests (ELRs), wheel locks, extensions and anti-tippers. Back cushion   05/21/19 1440   05/21/19 1427  For home use only DME Hospital bed  Once    Question Answer Comment  Length of Need 12 Months   Patient has (list medical condition): DVT/PE with decreased mobility   The above medical condition requires: Patient requires the ability to reposition frequently   Head must be elevated greater than: 30 degrees   Bed type Semi-electric   Support Surface: Gel Overlay      05/21/19 1427   05/21/19 1419  For home use only DME high strength lightweight manual wheelchair with seat cushion  Once    Comments: Patient suffers from debility, deconditioning, extensive deep vein thrombosis of the lower extremities, history of pulmonary embolism, diabetes mellitus, obesity which impairs their ability to perform daily activities like bathing, grooming and toileting in the home.  A cane or walker will not resolve  issue with performing activities of daily living. A wheelchair will allow patient to safely perform daily activities.Length of need 6 months . (THEN ONE OF THESE TWO:) Patient self-propels the  wheelchair while engaging in frequent activities such as laundry, meals and toileting which cannot be performed in a standard or lightweight wheelchair due to the weight of the chair. Accessories: elevating leg rests (ELRs), wheel locks, extensions and anti-tippers.   05/21/19 1423

## 2019-05-21 NOTE — TOC Benefit Eligibility Note (Signed)
Transition of Care [facility]) Benefit Eligibility Note    Patient Details  Name: Regina Munoz MRN: [id number] Date of Birth: Jan 18, 1951               [facility] with Person/Company/Phone Number:: SHARON  @  [place] / [employer] #  [phone] OPT- 2           Additional Notes: PLAN UNCONSTRUCTION FOR 2021 UNABLE TO GIVE AND ESTIMATE CO-PAY FOR ANY MECICINE ON  Toniann Fail Phone Number: 05/21/2019, 12:43 PM

## 2019-05-21 NOTE — Progress Notes (Signed)
Order received to discharge patient.  Telemetry monitor removed and CCMD notified.  PIV access removed.  Discharge instructions, follow up, medications and instructions for their use discussed with patient. 

## 2019-05-21 NOTE — TOC Transition Note (Signed)
Transition of Care [facility]) - CM/SW Discharge Note Donn Pierini RN, BSN Transitions of Care Unit 4E- RN Case Manager [phone]   Patient Details  Name: Regina Munoz MRN: [id number] Date of Birth: April 26, 1951  Transition of Care [facility]) CM/SW Contact:  Darrold Span, RN Phone Number: 05/21/2019, 3:19 PM   Clinical Narrative:    Pt stable for transition home today, per MD pt to go home on Lovenox- benefits check submitted however pt's new insurance info not in system- CM spoke with pt and pt had her new insurance card at bedside- copy made and placed on shadow chart- call made to [facility] and new info provided to them for 2021 insurance- was able to get copay cost for Lovenox- $90/30 day supply- per [facility] they can fill 10 syringes to day and will have to order the rest that they can have delivered to pt tomorrow. Info provided to pt and spouse and they are agreeable to this. Choice offered for [facility] and DME needs- list provided Per CMS guidelines from medicare.gov website with star ratings (copy placed in shadow chart)per pt her choice is as follows- [facility], [facility], [facility]- pt would like hospital bed and w/c for home also. Orders placed for HHPT and DME needs- Per pt she is going to stay at her son's home in [place] and would like her DIL Amy Delia to be her contact-  Address for d/c- [address], [place] [address] - Phone for Amy- [phone] Call made to [facility]- unable to accept, [facility] - unable to service [facility], Call made to [facility] with Frances Furbish who is able to accept referral for [facility] needs.  Spoke with Ian Malkin with [facility] for DME- needs- will process orders for bed and w/c- hospital bed to be delivered to home and w/c will be delivered to hospital room prior to discharge.  Discussed transportation home- pt and spouse has decided to transport home via private vehicle as can not [employer] will cover cost of non- emergent transport home via EMS.     Final next level of care: Home w Home Health Services Barriers to Discharge: Barriers Resolved   Patient Goals and CMS Choice Patient states their goals for this hospitalization and ongoing recovery are:: To get better to go home CMS Medicare.gov Compare Post Acute Care list provided to:: Patient Choice offered to / list presented to : Patient  Discharge Placement               Home with [facility]        Discharge Plan and Services In-house Referral: Clinical Social Work Discharge Planning Services: CM Consult Post Acute Care Choice: Home Health, Durable Medical Equipment          DME Arranged: Hospital bed, Wheelchair manual DME Agency: [facility] Date DME Agency Contacted: 05/21/19 Time DME Agency Contacted: [phone] Representative spoke with at DME Agency: Ian Malkin HH Arranged: PT HH Agency: [facility] Date [facility] Agency Contacted: 05/21/19 Time HH Agency Contacted: 1518 Representative spoke with at [facility] Agency: Kandee Keen  Social Determinants of Health (SDOH) Interventions     Readmission Risk Interventions Readmission Risk Prevention Plan 05/21/2019  Transportation Screening Complete  PCP or Specialist Appt within 3-5 Days Complete  HRI or Home Care Consult Complete  Social Work Consult for Recovery Care Planning/Counseling Complete  Palliative Care Screening Not Applicable  Medication Review Ocean[employer]) Complete  Some recent data might be hidden

## 2019-05-21 NOTE — Discharge Summary (Signed)
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  Diet recommendation:  Carbohydrate-modified.    Wound care: None.  Code status: Full.   History of Present Illness:   As per HPI, 69 year old woman discharged on 12/17 following treatment for Covid pneumonia diagnosed 11/30, hospital course complicated by worsening hypoxemia leading to CTA which revealed massive bilateral pulmonary embolism with RV strain, started on heparin infusion, then developed abdominal pain and CT revealed spontaneous left pelvic hematoma. IR consulted for possible embolization but patient was stabilized after transfusion. Left lower extremity venous ultrasound showed DVT. IR was consulted and IVC filter placed 12/14. She was subsequently discharged home on oxygen, no anticoagulation. She was seen both by interventional radiology and pulmonology during that hospitalization.  Patient then presented on 12/25 with increasing fatigue.  Patient was admitted for severe sepsis from pneumonia, subsequently complained of increasing lower extremity edema and pain, ultrasound revealed extensive bilateral lower extremity DVTs with progression with concern for involvement into the pelvis and even to  IVC filter. She was transferred to [facility] for further evaluation.She was seen by vascular surgery, but in the absence of anticoagulation, no intervention was recommended. Interventional radiology and hematology were subsequently consulted and patient was started on heparin drip.   Hospital Course:  Following conditions were addressed during hospitalization as listed below, follow-up plan in italics  Severe sepsis secondary to left lobar pneumonia.  Patient presented with with tachycardia, tachypnea and lactic acidosis with acute hypoxic respiratory failure. Chest x-ray with opacity of the left lung base.   Patient was empirically started on vancomycin and cefepime due to history of recent Covid pneumonia. --MRSA PCR was negative on December 8.   Completed 7-day course of antibiotic during hospitalization..  Blood cultures were negative.  Patient was subsequently weaned to room air and has been stable for the last several days.  Extensive, progressivebilateral lower extremity DVTwith concern for progression to the iliac and IVC with history of IVC filter. Dr. Pascal Lux , interventional radiology and Dr. Lindi Adie, oncology were consulted and patient was started on heparin drip.   IR followed the patient and the patient underwent mechanical thrombectomy on May 16, 2019.    Oncology recommended Lovenox as the source of anticoagulation after discharge.  Hemoglobin was closely monitored during hospitalization since patient had spontaneous retroperitoneal hematoma on previous hospitalization. No significant interval change on CT 12/27 compared to previous study.   Hemoglobin did not significantly drop so the patient was put on Lovenox on discharge.  Patient will however have to discuss with his primary care physician and hemato oncology as outpatient to discuss about the long-term anticoagulation.  Submassive bilateral PE with right-sided heart strain by CT, diagnosed on previous admission  (discharged 05/02/2019).  Had IVC filter in place.  Now presented with extensive DVT.  Status post mechanical thrombectomy.  We will continue Lovenox on discharge.   Thrombocytopenia,   resolved  Hyponatremia.  Resolved, sodium of 138.  Mild hypokalemia.  Improved with replacement.  Latest potassium of 4.1  Constipation.   Improved .    Diabetes mellitus type 2 -We will resume home insulin regimen on discharge.  Status post COVID-19 viral pneumonia treated with remdesivir and Decadron with discharge 12/17. Initially Covid + 04/15/2019.  No acute issues at this time.   Disposition.  At this time, patient is stable for disposition home with home health.  Medical Consultants:    Hematooncology  Interventional radiology  Vascular surgery  Procedures:    Mechanical thrombectomy May 16, 2019.  Subjective:   Today, patient denies any chest pain, shortness of breath, increasing leg swelling.  Discharge Exam:   Vitals:   05/21/19 1103 05/21/19 1123  BP:  (!) 107/53  Pulse: 94 94  Resp: (!) 23 20  Temp:  98.6 F (37 C)  SpO2: 100% 97%   Vitals:   05/21/19 0600 05/21/19 0700 05/21/19 1103 05/21/19 1123  BP:    (!) 107/53  Pulse: 96 95 94 94  Resp: 20 19 (!) 23 20  Temp:    98.6 F (37 C)  TempSrc:    Oral  SpO2: 96% 93% 100% 97%  Weight:      Height:        General: Alert awake, not in obvious distress, morbidly obese  HENT: pupils equally reacting to light and accommodation.  No scleral pallor or icterus noted. Oral mucosa is moist.  Chest:  Clear breath sounds.  Diminished breath sounds bilaterally. No crackles or wheezes.  CVS: S1 &S2 heard. No murmur.  Regular rate and rhythm. Abdomen: Soft, nontender, nondistended.  Bowel sounds are heard.   Extremities: No cyanosis, clubbing but bilateral pitting edema noted with some tenderness.  Peripheral pulses are palpable. Psych: Alert, awake and oriented, normal mood CNS:  No cranial nerve deficits.   Power equal in all extremities.   Skin: Warm and dry.  No rashes noted.  The results of significant diagnostics from this hospitalization (including imaging, microbiology, ancillary and laboratory) are listed below for reference.     Diagnostic Studies:   DG Chest Port 1 View  Result Date: 05/11/2019 CLINICAL DATA:  Shortness of breath EXAM: PORTABLE CHEST 1 VIEW COMPARISON:  April 24, 2019 FINDINGS: The heart size and mediastinal contours are within normal limits. Mildly increased hazy airspace opacity seen at the left lung base. The visualized skeletal structures are unremarkable. IMPRESSION: Mildly increased hazy airspace opacity at the left lung base which could be due to atelectasis and/or early infectious etiology. Electronically Signed   By: Prudencio Pair M.D.   On: 05/11/2019 00:27     Labs:   Basic Metabolic Panel: Recent Labs  Lab 05/14/19 2350 05/16/19 0230 05/18/19 0249 05/19/19 0418 05/20/19 0405  NA 128* 131* 134* 137 138  K 4.8 4.3 3.8 3.4* 4.1  CL 102 104 110 112* 111  CO2 19* 19* 16* 18* 19*  GLUCOSE 240* 177* 194* 127* 139*  BUN 25* _0 CREATININE 0.90 0.85 0.76 0.73 0.76  CALCIUM 8.8* 8.7* 8.0* 8.3* 8.3*  MG  --  2.0 1.8 1.8 1.8   GFR Estimated Creatinine Clearance: 82.7 mL/min (by C-G formula based on SCr of 0.76 mg/dL). Liver Function Tests: Recent Labs  Lab 05/16/19 0230 05/19/19 0418  AST 24 27  ALT 16 12  ALKPHOS  87 67  BILITOT 1.0 0.7  PROT 6.4* 5.3*  ALBUMIN 2.6* 2.0*   No results for input(s): LIPASE, AMYLASE in the last 168 hours. No results for input(s): AMMONIA in the last 168 hours. Coagulation profile Recent Labs  Lab 05/16/19 0230  INR 1.0    CBC: Recent Labs  Lab 05/16/19 2335 05/17/19 0841 05/18/19 0249 05/19/19 0418 05/20/19 0405 05/21/19 0249  WBC 12.9* 12.1* 12.1* 11.3* 9.8 9.3  NEUTROABS 8.1* 8.7*  --   --   --   --   HGB 7.8* 8.3* 7.8* 7.9* 7.9* 7.9*  HCT 24.9* 26.8* 24.8* 25.3* 26.1* 26.1*  MCV  87.4 89.0 87.3 87.5 89.7 89.7  PLT 186 185 173 188 188 194   Cardiac Enzymes: No results for input(s): CKTOTAL, CKMB, CKMBINDEX, TROPONINI in the last 168 hours. BNP: Invalid input(s): POCBNP CBG: Recent Labs  Lab 05/20/19 1119 05/20/19 1624 05/20/19 2211 05/21/19 0626 05/21/19 1122  GLUCAP 165* 135* 152* 125* 150*   D-Dimer No results for input(s): DDIMER in the last 72 hours. Hgb A1c No results for input(s): HGBA1C in the last 72 hours. Lipid Profile No results for input(s): CHOL, HDL, LDLCALC, TRIG, CHOLHDL, LDLDIRECT in the last 72 hours. Thyroid function studies No results for input(s): TSH, T4TOTAL, T3FREE, THYROIDAB in the last 72 hours.  Invalid input(s): FREET3 Anemia work up No results for input(s): VITAMINB12, FOLATE, FERRITIN, TIBC, IRON, RETICCTPCT in the last 72 hours. Microbiology Recent Results (from the past 240 hour(s))  Surgical PCR screen     Status: None   Collection Time: 05/16/19  6:09 AM   Specimen: Nasal Mucosa; Nasal Swab  Result Value Ref Range Status   MRSA, PCR NEGATIVE NEGATIVE Final   Staphylococcus aureus NEGATIVE NEGATIVE Final    Comment: (NOTE) The Xpert SA Assay (FDA approved for NASAL specimens in patients 56 years of age and older), is one component of a comprehensive surveillance program. It is not intended to diagnose infection nor to guide or monitor treatment. Performed at [facility], [place] [address], [place], [place] [address]      Discharge Instructions:   Discharge Instructions    Call MD for:   Complete by: As directed    Worsening symptoms including chest pain, shortness of breath   Call MD for:  redness, tenderness, or signs of infection (pain, swelling, redness, odor or green/yellow discharge around incision site)   Complete by: As directed    Call MD for:  temperature >100.4   Complete by: As directed    Diet - low sodium heart healthy   Complete by: As directed    Diet Carb Modified   Complete by: As  directed    Discharge instructions   Complete by: As directed    Follow up with IR for repeat duplex imaging (radiology to contact you). Continue ambulation at home as per PT at home. Continue to use lovenox for blood thinner. Follow up with hematology oncology in 2 weeks to discuss about blood thinners.  Follow-up with your primary care physician in 1 to 2 weeks for general follow-up and discussion about blood thinners.   Increase activity slowly   Complete by: As directed    As per home PT     Allergies as of 05/21/2019   No Known Allergies     Medication List    TAKE these medications   acetaminophen 500 MG tablet Commonly known as: TYLENOL Take 1,000 mg by mouth every 6 (six) hours as  needed for mild pain or moderate pain.   albuterol 108 (90 Base) MCG/ACT inhaler Commonly known as: VENTOLIN HFA Inhale 2 puffs into the lungs every 4 (four) hours as needed for wheezing or shortness of breath.   ascorbic acid 500 MG tablet Commonly known as: VITAMIN C Take 1 tablet (500 mg total) by mouth daily.   blood glucose meter kit and supplies Kit Dispense based on patient and insurance preference. Use up to four times daily as directed. (FOR ICD-9 250.00, 250.01).   buPROPion 150 MG 12 hr tablet Commonly known as: ZYBAN Take 75 mg by mouth every morning.   cetirizine 10 MG tablet Commonly known as: ZYRTEC Take 10 mg by mouth daily.   cholecalciferol 1000 units tablet Commonly known as: VITAMIN D Take 1,000 Units by mouth daily.   dextromethorphan-guaiFENesin 30-600 MG 12hr tablet Commonly known as: MUCINEX DM Take 1 tablet by mouth 2 (two) times daily.   diclofenac Sodium 1 % Gel Commonly known as: VOLTAREN Apply 2 g topically daily as needed (for pain).   enoxaparin 100 MG/ML injection Commonly known as: LOVENOX Inject 1 mL (100 mg total) into the skin every 12 (twelve) hours.   Insulin Glargine 100 UNIT/ML Solostar Pen Commonly known as: LANTUS Inject 10 Units into  the skin daily.   metFORMIN 500 MG tablet Commonly known as: GLUCOPHAGE Take 500 mg by mouth 2 (two) times daily with a meal.   omeprazole 20 MG capsule Commonly known as: PRILOSEC Take 20 mg by mouth daily.   Pen Needles 3/16" 31G X 5 MM Misc 1 application by Does not apply route daily.   polyethylene glycol 17 g packet Commonly known as: MIRALAX / GLYCOLAX Take 17 g by mouth daily as needed for mild constipation.   Spiriva HandiHaler 18 MCG inhalation capsule Generic drug: tiotropium Place 1 capsule (18 mcg total) into inhaler and inhale 2 (two) times daily.   zinc sulfate 220 (50 Zn) MG capsule Take 1 capsule (220 mg total) by mouth daily.      Follow-up Information    Octavio Graves P, DO. Schedule an appointment as soon as possible for a visit in 1 week(s).   Why: regular appointment Contact information: [address] [place] [address] [phone]        Nicholas Lose, MD. Schedule an appointment as soon as possible for a visit in 2 week(s).   Specialty: Hematology and Oncology Why: to discuss about long term anticoagulation, currently on eliquis Contact information: [place] [address] [phone]           Time coordinating discharge: 39 minutes  Signed:  Bryce Cheever  [facility] 05/21/2019, 2:16 PM

## 2019-05-23 ENCOUNTER — Telehealth: Payer: Self-pay | Admitting: Hematology and Oncology

## 2019-05-23 DIAGNOSIS — E872 Acidosis: Secondary | ICD-10-CM | POA: Diagnosis not present

## 2019-05-23 DIAGNOSIS — N179 Acute kidney failure, unspecified: Secondary | ICD-10-CM | POA: Diagnosis not present

## 2019-05-23 DIAGNOSIS — Z48812 Encounter for surgical aftercare following surgery on the circulatory system: Secondary | ICD-10-CM | POA: Diagnosis not present

## 2019-05-23 DIAGNOSIS — Z9181 History of falling: Secondary | ICD-10-CM | POA: Diagnosis not present

## 2019-05-23 DIAGNOSIS — J45909 Unspecified asthma, uncomplicated: Secondary | ICD-10-CM | POA: Diagnosis not present

## 2019-05-23 DIAGNOSIS — U071 COVID-19: Secondary | ICD-10-CM | POA: Diagnosis not present

## 2019-05-23 DIAGNOSIS — R000 Tachycardia, unspecified: Secondary | ICD-10-CM | POA: Diagnosis not present

## 2019-05-23 DIAGNOSIS — E119 Type 2 diabetes mellitus without complications: Secondary | ICD-10-CM | POA: Diagnosis not present

## 2019-05-23 DIAGNOSIS — I82433 Acute embolism and thrombosis of popliteal vein, bilateral: Secondary | ICD-10-CM | POA: Diagnosis not present

## 2019-05-23 DIAGNOSIS — I519 Heart disease, unspecified: Secondary | ICD-10-CM | POA: Diagnosis not present

## 2019-05-23 DIAGNOSIS — Z7901 Long term (current) use of anticoagulants: Secondary | ICD-10-CM | POA: Diagnosis not present

## 2019-05-23 DIAGNOSIS — Z794 Long term (current) use of insulin: Secondary | ICD-10-CM | POA: Diagnosis not present

## 2019-05-23 DIAGNOSIS — K5900 Constipation, unspecified: Secondary | ICD-10-CM | POA: Diagnosis not present

## 2019-05-23 DIAGNOSIS — F419 Anxiety disorder, unspecified: Secondary | ICD-10-CM | POA: Diagnosis not present

## 2019-05-23 DIAGNOSIS — I2699 Other pulmonary embolism without acute cor pulmonale: Secondary | ICD-10-CM | POA: Diagnosis not present

## 2019-05-23 DIAGNOSIS — J9601 Acute respiratory failure with hypoxia: Secondary | ICD-10-CM | POA: Diagnosis not present

## 2019-05-23 DIAGNOSIS — F329 Major depressive disorder, single episode, unspecified: Secondary | ICD-10-CM | POA: Diagnosis not present

## 2019-05-23 DIAGNOSIS — J1289 Other viral pneumonia: Secondary | ICD-10-CM | POA: Diagnosis not present

## 2019-05-23 DIAGNOSIS — I82463 Acute embolism and thrombosis of calf muscular vein, bilateral: Secondary | ICD-10-CM | POA: Diagnosis not present

## 2019-05-23 DIAGNOSIS — Z95828 Presence of other vascular implants and grafts: Secondary | ICD-10-CM | POA: Diagnosis not present

## 2019-05-23 DIAGNOSIS — E876 Hypokalemia: Secondary | ICD-10-CM | POA: Diagnosis not present

## 2019-05-23 NOTE — Telephone Encounter (Signed)
Received a call from Ms. Hopkin's daughter to schedule a hospital f/u appt w/Dr. Pamelia Hoit on 1/26 at 3:15pm.

## 2019-05-24 DIAGNOSIS — J181 Lobar pneumonia, unspecified organism: Secondary | ICD-10-CM | POA: Diagnosis not present

## 2019-05-24 DIAGNOSIS — R6520 Severe sepsis without septic shock: Secondary | ICD-10-CM | POA: Diagnosis not present

## 2019-05-24 DIAGNOSIS — A419 Sepsis, unspecified organism: Secondary | ICD-10-CM | POA: Diagnosis not present

## 2019-05-24 DIAGNOSIS — U071 COVID-19: Secondary | ICD-10-CM | POA: Diagnosis not present

## 2019-05-28 DIAGNOSIS — U071 COVID-19: Secondary | ICD-10-CM | POA: Diagnosis not present

## 2019-05-28 DIAGNOSIS — R6520 Severe sepsis without septic shock: Secondary | ICD-10-CM | POA: Diagnosis not present

## 2019-05-28 DIAGNOSIS — A419 Sepsis, unspecified organism: Secondary | ICD-10-CM | POA: Diagnosis not present

## 2019-05-28 DIAGNOSIS — R0600 Dyspnea, unspecified: Secondary | ICD-10-CM | POA: Diagnosis not present

## 2019-05-28 DIAGNOSIS — R110 Nausea: Secondary | ICD-10-CM | POA: Diagnosis not present

## 2019-05-28 DIAGNOSIS — B349 Viral infection, unspecified: Secondary | ICD-10-CM | POA: Diagnosis not present

## 2019-05-28 DIAGNOSIS — J181 Lobar pneumonia, unspecified organism: Secondary | ICD-10-CM | POA: Diagnosis not present

## 2019-06-04 DIAGNOSIS — I2699 Other pulmonary embolism without acute cor pulmonale: Secondary | ICD-10-CM | POA: Diagnosis not present

## 2019-06-04 DIAGNOSIS — E876 Hypokalemia: Secondary | ICD-10-CM | POA: Diagnosis not present

## 2019-06-04 DIAGNOSIS — R000 Tachycardia, unspecified: Secondary | ICD-10-CM | POA: Diagnosis not present

## 2019-06-04 DIAGNOSIS — I82463 Acute embolism and thrombosis of calf muscular vein, bilateral: Secondary | ICD-10-CM | POA: Diagnosis not present

## 2019-06-04 DIAGNOSIS — E872 Acidosis: Secondary | ICD-10-CM | POA: Diagnosis not present

## 2019-06-04 DIAGNOSIS — J9601 Acute respiratory failure with hypoxia: Secondary | ICD-10-CM | POA: Diagnosis not present

## 2019-06-04 DIAGNOSIS — K5900 Constipation, unspecified: Secondary | ICD-10-CM | POA: Diagnosis not present

## 2019-06-04 DIAGNOSIS — J45909 Unspecified asthma, uncomplicated: Secondary | ICD-10-CM | POA: Diagnosis not present

## 2019-06-04 DIAGNOSIS — Z9181 History of falling: Secondary | ICD-10-CM | POA: Diagnosis not present

## 2019-06-04 DIAGNOSIS — Z7901 Long term (current) use of anticoagulants: Secondary | ICD-10-CM | POA: Diagnosis not present

## 2019-06-04 DIAGNOSIS — I82433 Acute embolism and thrombosis of popliteal vein, bilateral: Secondary | ICD-10-CM | POA: Diagnosis not present

## 2019-06-04 DIAGNOSIS — N179 Acute kidney failure, unspecified: Secondary | ICD-10-CM | POA: Diagnosis not present

## 2019-06-04 DIAGNOSIS — Z48812 Encounter for surgical aftercare following surgery on the circulatory system: Secondary | ICD-10-CM | POA: Diagnosis not present

## 2019-06-04 DIAGNOSIS — F419 Anxiety disorder, unspecified: Secondary | ICD-10-CM | POA: Diagnosis not present

## 2019-06-04 DIAGNOSIS — J1289 Other viral pneumonia: Secondary | ICD-10-CM | POA: Diagnosis not present

## 2019-06-04 DIAGNOSIS — E119 Type 2 diabetes mellitus without complications: Secondary | ICD-10-CM | POA: Diagnosis not present

## 2019-06-04 DIAGNOSIS — Z95828 Presence of other vascular implants and grafts: Secondary | ICD-10-CM | POA: Diagnosis not present

## 2019-06-04 DIAGNOSIS — U071 COVID-19: Secondary | ICD-10-CM | POA: Diagnosis not present

## 2019-06-04 DIAGNOSIS — F329 Major depressive disorder, single episode, unspecified: Secondary | ICD-10-CM | POA: Diagnosis not present

## 2019-06-04 DIAGNOSIS — Z794 Long term (current) use of insulin: Secondary | ICD-10-CM | POA: Diagnosis not present

## 2019-06-04 DIAGNOSIS — I519 Heart disease, unspecified: Secondary | ICD-10-CM | POA: Diagnosis not present

## 2019-06-06 ENCOUNTER — Other Ambulatory Visit: Payer: Self-pay | Admitting: Interventional Radiology

## 2019-06-06 DIAGNOSIS — I82463 Acute embolism and thrombosis of calf muscular vein, bilateral: Secondary | ICD-10-CM

## 2019-06-10 NOTE — Progress Notes (Signed)
Patient Care Team: Scotty Court, DO as PCP - General  DIAGNOSIS:    ICD-10-CM   1. Bilateral pulmonary embolism ([place])  I26.99 CBC with Differential ([place] Only)    Reticulocytes    Sample to Blood Bank    CBC with Differential ([place] Only)    CHIEF COMPLIANT: Follow-up of hospitalization for DVT, PE  INTERVAL HISTORY: Regina Munoz is a 69 y.o. with above-mentioned history of extensive, progressive bilateral lower extremity DVT and PE while she was hospitalized for a COVID-19 infection. She was admitted to [facility] again from 05/11/19-05/21/19 due to a progression of her left lower extremity DVT. She is currently on anticoagulation with Lovenox. She presents to the clinic today for follow-up of recent hospitalization.  She is recovering fairly well from the recent hospitalization.  She is able to walk with the help of walker and tomorrow she plans to walk with the help of a cane with physical therapy.  Her breathing has markedly improved.  She is currently on Lovenox injections and tolerating them fairly well.  ALLERGIES:  has No Known Allergies.  MEDICATIONS:  Current Outpatient Medications  Medication Sig Dispense Refill  . acetaminophen (TYLENOL) 500 MG tablet Take 1,000 mg by mouth every 6 (six) hours as needed for mild pain or moderate pain.     Marland Kitchen albuterol (VENTOLIN HFA) 108 (90 Base) MCG/ACT inhaler Inhale 2 puffs into the lungs every 4 (four) hours as needed for wheezing or shortness of breath. 8 g 0  . ascorbic acid (VITAMIN C) 500 MG tablet Take 1 tablet (500 mg total) by mouth daily. 30 tablet 0  . blood glucose meter kit and supplies KIT Dispense based on patient and insurance preference. Use up to four times daily as directed. (FOR ICD-9 250.00, 250.01). 1 each 0  . buPROPion (ZYBAN) 150 MG 12 hr tablet Take 75 mg by mouth every morning.     . cetirizine (ZYRTEC) 10 MG tablet Take 10 mg by mouth daily.    . cholecalciferol (VITAMIN D) 1000 UNITS  tablet Take 1,000 Units by mouth daily.    Marland Kitchen dextromethorphan-guaiFENesin (MUCINEX DM) 30-600 MG 12hr tablet Take 1 tablet by mouth 2 (two) times daily. 15 tablet 0  . diclofenac Sodium (VOLTAREN) 1 % GEL Apply 2 g topically daily as needed (for pain).     . Insulin Glargine (LANTUS) 100 UNIT/ML Solostar Pen Inject 10 Units into the skin daily. 15 mL 11  . Insulin Pen Needle (PEN NEEDLES 3/16") 31G X 5 MM MISC 1 application by Does not apply route daily. 30 each 10  . metFORMIN (GLUCOPHAGE) 500 MG tablet Take 500 mg by mouth 2 (two) times daily with a meal.     . omeprazole (PRILOSEC) 20 MG capsule Take 20 mg by mouth daily.    . polyethylene glycol (MIRALAX / GLYCOLAX) 17 g packet Take 17 g by mouth daily as needed for mild constipation. 14 each 0  . rivaroxaban (XARELTO) 20 MG TABS tablet Take 1 tablet (20 mg total) by mouth daily with supper. 90 tablet 3  . tiotropium (SPIRIVA HANDIHALER) 18 MCG inhalation capsule Place 1 capsule (18 mcg total) into inhaler and inhale 2 (two) times daily. 30 capsule 12  . umeclidinium-vilanterol (ANORO ELLIPTA) 62.5-25 MCG/INH AEPB Inhale 1 puff into the lungs daily.    Marland Kitchen zinc sulfate 220 (50 Zn) MG capsule Take 1 capsule (220 mg total) by mouth daily. 30 capsule 0   No current facility-administered  medications for this visit.    PHYSICAL EXAMINATION: ECOG PERFORMANCE STATUS: 1 - Symptomatic but completely ambulatory  Vitals:   06/11/19 1546  BP: 131/64  Pulse: (!) 103  Resp: 17  Temp: 98.3 F (36.8 C)  SpO2: 96%   Filed Weights   06/11/19 1546  Weight: 231 lb 12.8 oz (105.1 kg)    LABORATORY DATA:  I have reviewed the data as listed CMP Latest Ref Rng & Units 05/20/2019 05/19/2019 05/18/2019  Glucose 70 - 99 mg/dL 139(H) 127(H) 194(H)  BUN 8 - 23 mg/dL 8 9 13   Creatinine 0.44 - 1.00 mg/dL 0.76 0.73 0.76  Sodium 135 - 145 mmol/L 138 137 134(L)  Potassium 3.5 - 5.1 mmol/L 4.1 3.4(L) 3.8  Chloride 98 - 111 mmol/L 111 112(H) 110  CO2 22 - 32  mmol/L 19(L) 18(L) 16(L)  Calcium 8.9 - 10.3 mg/dL 8.3(L) 8.3(L) 8.0(L)  Total Protein 6.5 - 8.1 g/dL - 5.3(L) -  Total Bilirubin 0.3 - 1.2 mg/dL - 0.7 -  Alkaline Phos 38 - 126 U/L - 67 -  AST 15 - 41 U/L - 27 -  ALT 0 - 44 U/L - 12 -    Lab Results  Component Value Date   WBC 6.4 06/11/2019   HGB 12.1 06/11/2019   HCT 40.6 06/11/2019   MCV 88.5 06/11/2019   PLT 239 06/11/2019   NEUTROABS 2.8 06/11/2019    ASSESSMENT & PLAN:  Bilateral pulmonary embolism (HCC) Bilateral PE with right-sided heart strain and extensive bilateral lower extremity DVTs with extension into the iliac and IVC. IVC filter placed Mechanical thrombectomy Retroperitoneal hematoma led to severe anemia requiring blood transfusion Lab review: 06/11/2019: Hemoglobin 12.1   Current treatment: Anticoagulation with Lovenox.  Based on excellent hemoglobin values, we recommended switching her from Lovenox to Xarelto.  Plan treatment:  Xarelto for 1 year Most likely etiology: Covid pneumonia related thromboembolic disease. Covid pneumonia treated with remdesivir and Decadron   Return to clinic in 3 months with labs and follow-up and after that we can see her the end of 1 year.     Orders Placed This Encounter  Procedures  . CBC with Differential ([facility] Only)    Standing Status:   Future    Number of Occurrences:   1    Standing Expiration Date:   06/10/2020  . Reticulocytes    Standing Status:   Future    Number of Occurrences:   1    Standing Expiration Date:   06/10/2020  . CBC with Differential (Cancer Center Only)    Standing Status:   Future    Standing Expiration Date:   06/10/2020  . Sample to Blood Bank    Standing Status:   Future    Number of Occurrences:   1    Standing Expiration Date:   06/10/2020   The patient has a good understanding of the overall plan. she agrees with it. she will call with any problems that may develop before the next visit here.  Total time spent: 30 mins  including face to face time and time spent for planning, charting and coordination of care  Nicholas Lose, MD 06/11/2019  I, Cloyde Reams Dorshimer, am acting as scribe for Dr. Nicholas Lose.  I have reviewed the above documentation for accuracy and completeness, and I agree with the above.

## 2019-06-11 ENCOUNTER — Other Ambulatory Visit: Payer: Self-pay

## 2019-06-11 ENCOUNTER — Inpatient Hospital Stay: Payer: PPO | Attending: Hematology and Oncology | Admitting: Hematology and Oncology

## 2019-06-11 ENCOUNTER — Inpatient Hospital Stay: Payer: PPO

## 2019-06-11 DIAGNOSIS — Z7901 Long term (current) use of anticoagulants: Secondary | ICD-10-CM | POA: Insufficient documentation

## 2019-06-11 DIAGNOSIS — Z79899 Other long term (current) drug therapy: Secondary | ICD-10-CM | POA: Insufficient documentation

## 2019-06-11 DIAGNOSIS — I2699 Other pulmonary embolism without acute cor pulmonale: Secondary | ICD-10-CM | POA: Diagnosis not present

## 2019-06-11 DIAGNOSIS — I82403 Acute embolism and thrombosis of unspecified deep veins of lower extremity, bilateral: Secondary | ICD-10-CM | POA: Insufficient documentation

## 2019-06-11 DIAGNOSIS — U071 COVID-19: Secondary | ICD-10-CM | POA: Insufficient documentation

## 2019-06-11 DIAGNOSIS — Z794 Long term (current) use of insulin: Secondary | ICD-10-CM | POA: Insufficient documentation

## 2019-06-11 DIAGNOSIS — J1282 Pneumonia due to coronavirus disease 2019: Secondary | ICD-10-CM | POA: Diagnosis not present

## 2019-06-11 DIAGNOSIS — D649 Anemia, unspecified: Secondary | ICD-10-CM | POA: Diagnosis not present

## 2019-06-11 DIAGNOSIS — E119 Type 2 diabetes mellitus without complications: Secondary | ICD-10-CM | POA: Diagnosis not present

## 2019-06-11 DIAGNOSIS — K661 Hemoperitoneum: Secondary | ICD-10-CM | POA: Diagnosis not present

## 2019-06-11 LAB — CBC WITH DIFFERENTIAL (CANCER CENTER ONLY)
Abs Immature Granulocytes: 0.03 10*3/uL (ref 0.00–0.07)
Basophils Absolute: 0.1 10*3/uL (ref 0.0–0.1)
Basophils Relative: 1 %
Eosinophils Absolute: 0.2 10*3/uL (ref 0.0–0.5)
Eosinophils Relative: 3 %
HCT: 40.6 % (ref 36.0–46.0)
Hemoglobin: 12.1 g/dL (ref 12.0–15.0)
Immature Granulocytes: 1 %
Lymphocytes Relative: 42 %
Lymphs Abs: 2.7 10*3/uL (ref 0.7–4.0)
MCH: 26.4 pg (ref 26.0–34.0)
MCHC: 29.8 g/dL — ABNORMAL LOW (ref 30.0–36.0)
MCV: 88.5 fL (ref 80.0–100.0)
Monocytes Absolute: 0.6 10*3/uL (ref 0.1–1.0)
Monocytes Relative: 9 %
Neutro Abs: 2.8 10*3/uL (ref 1.7–7.7)
Neutrophils Relative %: 44 %
Platelet Count: 239 10*3/uL (ref 150–400)
RBC: 4.59 MIL/uL (ref 3.87–5.11)
RDW: 18 % — ABNORMAL HIGH (ref 11.5–15.5)
WBC Count: 6.4 10*3/uL (ref 4.0–10.5)
nRBC: 0 % (ref 0.0–0.2)

## 2019-06-11 LAB — RETICULOCYTES
Immature Retic Fract: 11.5 % (ref 2.3–15.9)
RBC.: 4.57 MIL/uL (ref 3.87–5.11)
Retic Count, Absolute: 56.2 10*3/uL (ref 19.0–186.0)
Retic Ct Pct: 1.2 % (ref 0.4–3.1)

## 2019-06-11 LAB — SAMPLE TO BLOOD BANK

## 2019-06-11 MED ORDER — RIVAROXABAN 20 MG PO TABS: 20.0000 mg | ORAL_TABLET | Freq: Every day | ORAL | 3 refills | Status: DC

## 2019-06-11 MED ORDER — ANORO ELLIPTA 62.5-25 MCG/INH IN AEPB: 1.0000 | INHALATION_SPRAY | Freq: Every day | RESPIRATORY_TRACT | Status: DC

## 2019-06-11 NOTE — Assessment & Plan Note (Signed)
Bilateral PE with right-sided heart strain and extensive bilateral lower extremity DVTs with extension into the iliac and IVC. IVC filter placed Mechanical thrombectomy Current treatment: Anticoagulation with Lovenox Plan treatment: Indefinite anticoagulation with Xarelto Most likely etiology: Covid pneumonia related thromboembolic disease. Covid pneumonia treated with remdesivir and Decadron

## 2019-06-12 ENCOUNTER — Telehealth: Payer: Self-pay | Admitting: Hematology and Oncology

## 2019-06-12 NOTE — Telephone Encounter (Signed)
I talk with patient regarding schedule  

## 2019-06-18 ENCOUNTER — Ambulatory Visit
Admission: RE | Admit: 2019-06-18 | Discharge: 2019-06-18 | Disposition: A | Discharge status: Home / Self Care | Payer: Medicare Other | Source: Ambulatory Visit | Attending: Student | Admitting: Student

## 2019-06-18 ENCOUNTER — Encounter: Payer: Self-pay | Admitting: *Deleted

## 2019-06-18 ENCOUNTER — Ambulatory Visit
Admission: RE | Admit: 2019-06-18 | Discharge: 2019-06-18 | Disposition: A | Discharge status: Home / Self Care | Payer: Medicare Other | Source: Ambulatory Visit | Attending: Interventional Radiology | Admitting: Interventional Radiology

## 2019-06-18 DIAGNOSIS — I82463 Acute embolism and thrombosis of calf muscular vein, bilateral: Secondary | ICD-10-CM

## 2019-06-18 DIAGNOSIS — I82812 Embolism and thrombosis of superficial veins of left lower extremities: Secondary | ICD-10-CM | POA: Diagnosis not present

## 2019-06-18 HISTORY — PX: IR RADIOLOGIST EVAL & MGMT: IMG5224

## 2019-06-18 NOTE — Progress Notes (Signed)
Chief Complaint: Iliocaval and bilateral iliofemoral DVT, SP therapy  Referring Physician(s): Louk,Alexandra M  Hematology/Oncology: Dr. Lindi Adie  History of Present Illness: Regina Munoz is a 69 y.o. female presenting today for a scheduled follow up visit to [place] clinic, SP treatment for iliocaval and bilateral iliofemoral DVT.   Regina Munoz is here today with Regina Munoz daughter for our visit.   We first met Regina Munoz at [facility] hospital 04/28/2019 when Regina Munoz was referred to [place] for extra-peritoneal hemorrhage as a complication of anticoagulation.  Regina Munoz was started on heparin during hospitalization for COVID PNA, with PE and DVT.    Ultimately we did not treat Regina Munoz with any embolization, as Regina Munoz stabilized once [facility] was withdrawn.  Regina Munoz did require IVC filter placement.    Regina Munoz was DC'd but then came back via the ED 05/10/2019 with multiple complaints and was diagnosed with bilateral iliofemoral and iliocaval thrombus/DVT, up to the filter.   After consultation and discussion of risk benefits, including risk of significant post-thrombotic syndrome, risk of AC, etc, we restarted AC successfully, and were able to treat Regina Munoz IVC, iliac, and femoral DVT with mechanical/aspiration thrombectomy via bilateral popliteal vein access on 05/16/2019.    Regina Munoz was discharged 05/21/2019 having restarted AC.   Regina Munoz continues now on DOAC, and denies any GI bleeding, hematemesis, or other problems.    Regarding Regina Munoz leg symptoms, Regina Munoz tells me Regina Munoz is significantly better, and has been able to comfortably complete all Regina Munoz physical therapy, and also comfortably perform Regina Munoz ADL's.  Regina Munoz does not yet wear compression stockings.  Regina Munoz sleeps supine, with 2 pillows elevating Regina Munoz legs.    Regina Munoz has mild ankle swelling at the end of the day, which is better in the morning.    Past Medical History:  Diagnosis Date  . Anxiety   . Arthritis   . Asthma   . Complication of anesthesia   . COVID-19   . Diabetes mellitus without  complication ([place])   . Dysrhythmia    palpitations ->20 yrs stress test neg nothing since  . GERD (gastroesophageal reflux disease)   . Headache   . PONV (postoperative nausea and vomiting)     Past Surgical History:  Procedure Laterality Date  . ABDOMINAL HYSTERECTOMY    . ANTERIOR CERVICAL DECOMP/DISCECTOMY FUSION N/A 02/17/2016   Procedure: ANTERIOR CERVICAL DECOMPRESSION/DISCECTOMY FUSION 1 LEVEL C4-5;  Surgeon: Melina Schools, MD;  Location: [facility];  Service: Orthopedics;  Laterality: N/A;  . EYE SURGERY Bilateral    cataracts  . HIP CLOSED REDUCTION Right 12/24/2012   Procedure: CLOSED REDUCTION HIP;  Surgeon: Sinclair Ship, MD;  Location: [facility];  Service: Orthopedics;  Laterality: Right;  . IR IVC FILTER PLMT / S&I Burke Keels GUID/MOD SED  04/29/2019  . IR PTA VENOUS EXCEPT DIALYSIS CIRCUIT  05/16/2019  . IR THROMBECT SEC MECH MOD SED  05/16/2019  . IR THROMBECT VENO MECH MOD SED  05/16/2019  . IR US GUIDE VASC ACCESS LEFT  05/16/2019  . IR US GUIDE VASC ACCESS RIGHT  05/16/2019  . IR US GUIDE VASC ACCESS RIGHT  05/16/2019  . IR VENO/EXT/BI  05/16/2019  . IR VENOCAVAGRAM IVC  05/16/2019  . JOINT REPLACEMENT  2002   right and left knees  . SHOULDER ARTHROSCOPY Left   . TOTAL HIP ARTHROPLASTY Right    2002    Allergies: Patient has no known allergies.  Medications: Prior to Admission medications   Medication Sig Start Date End Date Taking? Authorizing Provider  acetaminophen (TYLENOL) 500 MG tablet Take 1,000 mg by mouth every 6 (six) hours as needed for mild pain or moderate pain.     [provider]  albuterol (VENTOLIN HFA) 108 (90 Base) MCG/ACT inhaler Inhale 2 puffs into the lungs every 4 (four) hours as needed for wheezing or shortness of breath. 05/02/19   Regalado, Belkys A, MD  ascorbic acid (VITAMIN C) 500 MG tablet Take 1 tablet (500 mg total) by mouth daily. 05/02/19   Regalado, Belkys A, MD  blood glucose meter kit and supplies KIT Dispense based on  patient and insurance preference. Use up to four times daily as directed. (FOR ICD-9 250.00, 250.01). 05/02/19   Regalado, Belkys A, MD  buPROPion (ZYBAN) 150 MG 12 hr tablet Take 75 mg by mouth every morning.  03/28/19   [provider]  cetirizine (ZYRTEC) 10 MG tablet Take 10 mg by mouth daily. 02/13/19   [provider]  cholecalciferol (VITAMIN D) 1000 UNITS tablet Take 1,000 Units by mouth daily.    [provider]  dextromethorphan-guaiFENesin (MUCINEX DM) 30-600 MG 12hr tablet Take 1 tablet by mouth 2 (two) times daily. 05/02/19   Regalado, Belkys A, MD  diclofenac Sodium (VOLTAREN) 1 % GEL Apply 2 g topically daily as needed (for pain).     [provider]  Insulin Glargine (LANTUS) 100 UNIT/ML Solostar Pen Inject 10 Units into the skin daily. 05/02/19   Regalado, Belkys A, MD  Insulin Pen Needle (PEN NEEDLES 3/16") 31G X 5 MM MISC 1 application by Does not apply route daily. 05/02/19   Regalado, Belkys A, MD  metFORMIN (GLUCOPHAGE) 500 MG tablet Take 500 mg by mouth 2 (two) times daily with a meal.     [provider]  omeprazole (PRILOSEC) 20 MG capsule Take 20 mg by mouth daily.    [provider]  polyethylene glycol (MIRALAX / GLYCOLAX) 17 g packet Take 17 g by mouth daily as needed for mild constipation. 05/02/19   Regalado, Belkys A, MD  rivaroxaban (XARELTO) 20 MG TABS tablet Take 1 tablet (20 mg total) by mouth daily with supper. 06/11/19   Nicholas Lose, MD  tiotropium (SPIRIVA HANDIHALER) 18 MCG inhalation capsule Place 1 capsule (18 mcg total) into inhaler and inhale 2 (two) times daily. 05/02/19   Regalado, Belkys A, MD  umeclidinium-vilanterol (ANORO ELLIPTA) 62.5-25 MCG/INH AEPB Inhale 1 puff into the lungs daily. 06/11/19   Nicholas Lose, MD  zinc sulfate 220 (50 Zn) MG capsule Take 1 capsule (220 mg total) by mouth daily. 05/02/19   Regalado, Cassie Freer, MD     No family history on file.  Social History   Socioeconomic  History  . Marital status: Married    Spouse name: Not on file  . Number of children: Not on file  . Years of education: Not on file  . Highest education level: Not on file  Occupational History  . Not on file  Tobacco Use  . Smoking status: Never Smoker  . Smokeless tobacco: Never Used  Substance and Sexual Activity  . Alcohol use: No  . Drug use: No  . Sexual activity: Not on file  Other Topics Concern  . Not on file  Social History Narrative  . Not on file   Social Determinants of Health   Financial Resource Strain: Low Risk   . Difficulty of Paying Living Expenses: Not hard at all  Food Insecurity: No Food Insecurity  . Worried About Charity fundraiser  in the Last Year: Never true  . Ran Out of Food in the Last Year: Never true  Transportation Needs: No Transportation Needs  . Lack of Transportation (Medical): No  . Lack of Transportation (Non-Medical): No  Physical Activity: Unknown  . Days of Exercise per Week: 1 day  . Minutes of Exercise per Session: Not on file  Stress:   . Feeling of Stress : Not on file  Social Connections:   . Frequency of Communication with Friends and Family: Not on file  . Frequency of Social Gatherings with Friends and Family: Not on file  . Attends Religious Services: Not on file  . Active Member of Clubs or Organizations: Not on file  . Attends Archivist Meetings: Not on file  . Marital Status: Not on file       Review of Systems: A 12 point ROS discussed and pertinent positives are indicated in the HPI above.  All other systems are negative.  Review of Systems  Vital Signs: There were no vitals taken for this visit.  Physical Exam General: 69 yo female appearing stated age.  Well-developed, well-nourished.  No distress. HEENT: Atraumatic, normocephalic.  Conjugate gaze, extra-ocular motor intact. No scleral icterus or scleral injection. No lesions on external ears, nose, lips, or gums.  Oral mucosa moist, pink.    Neck: Symmetric with no goiter enlargement.  Chest/Lungs:  Symmetric chest with inspiration/expiration.  No labored breathing.     Heart:  No JVD appreciated.  Abdomen:  Soft, NT/ND, with + bowel sounds.   Genito-urinary: Deferred Neurologic: Alert & Oriented to person, place, and time.   Normal affect and insight.  Appropriate questions.  Moving all 4 extremities with gross sensory intact.  Extremities: Symmetric lower extremities.  NO pitting edema.  No wounds of the lower extremities.  No induration or erythema.  Regina Munoz has retained sutures in the bilateral popliteal region which were removed today.        Imaging: No results found.  Labs:  CBC: Recent Labs    05/19/19 0418 05/20/19 0405 05/21/19 0249 06/11/19 1536  WBC 11.3* 9.8 9.3 6.4  HGB 7.9* 7.9* 7.9* 12.1  HCT 25.3* 26.1* 26.1* 40.6  PLT 188 188 194 239    COAGS: Recent Labs    04/27/19 2018 05/14/19 1200 05/16/19 0230  INR 1.4* 1.2 1.0  APTT  --  32  --     BMP: Recent Labs    05/16/19 0230 05/18/19 0249 05/19/19 0418 05/20/19 0405  NA 131* 134* 137 138  K 4.3 3.8 3.4* 4.1  CL 104 110 112* 111  CO2 19* 16* 18* 19*  GLUCOSE 177* 194* 127* 139*  BUN 20 13 9 8   CALCIUM 8.7* 8.0* 8.3* 8.3*  CREATININE 0.85 0.76 0.73 0.76  GFRNONAA >60 >60 >60 >60  GFRAA >60 >60 >60 >60    LIVER FUNCTION TESTS: Recent Labs    05/11/19 0316 05/12/19 0609 05/16/19 0230 05/19/19 0418  BILITOT 2.1* 2.2* 1.0 0.7  AST 23 17 24 27   ALT 13 13 16 12   ALKPHOS 77 76 87 67  PROT 7.9 8.0 6.4* 5.3*  ALBUMIN 3.9 3.7 2.6* 2.0*    TUMOR MARKERS: No results for input(s): AFPTM, CEA, CA199, CHROMGRNA in the last 8760 hours.  Assessment and Plan:  Regina Munoz is 69 yo female SP treatment of iliocaval and bilateral iliofemoral DVT, treated 05/16/2019 with mechanical and aspiration thrombectomy.    Regina Munoz symptoms are significantly improved.    I  had a discussion with Regina Munoz and Regina Munoz daughter about the next steps, which include  continuing on Regina Munoz [facility], as prescribed by Dr. Lindi Adie, adding compression therapy to Regina Munoz lower extremities.  Our recommendation would be bilateral thigh-high compression therapy, but I did explain to Regina Munoz that something, even knee-high, is better than nothing if Regina Munoz has trouble complying with thigh-high.   I also encouraged more exercise daily, to utilize calf-muscle pump.    Plan: - We will see Regina Munoz in 6 months with repeat duplex study   .  Electronically Signed: Corrie Mckusick 06/18/2019, 2:50 PM   I spent a total of    25 Minutes in face to face in clinical consultation, greater than 50% of which was counseling/coordinating care for iliocaval and iliofemoral DVT, SP mechanical thrombectomy.

## 2019-06-24 DIAGNOSIS — U071 COVID-19: Secondary | ICD-10-CM | POA: Diagnosis not present

## 2019-06-24 DIAGNOSIS — J181 Lobar pneumonia, unspecified organism: Secondary | ICD-10-CM | POA: Diagnosis not present

## 2019-06-24 DIAGNOSIS — A419 Sepsis, unspecified organism: Secondary | ICD-10-CM | POA: Diagnosis not present

## 2019-06-24 DIAGNOSIS — R6520 Severe sepsis without septic shock: Secondary | ICD-10-CM | POA: Diagnosis not present

## 2019-07-22 DIAGNOSIS — U071 COVID-19: Secondary | ICD-10-CM | POA: Diagnosis not present

## 2019-07-22 DIAGNOSIS — R6520 Severe sepsis without septic shock: Secondary | ICD-10-CM | POA: Diagnosis not present

## 2019-07-22 DIAGNOSIS — A419 Sepsis, unspecified organism: Secondary | ICD-10-CM | POA: Diagnosis not present

## 2019-07-22 DIAGNOSIS — J181 Lobar pneumonia, unspecified organism: Secondary | ICD-10-CM | POA: Diagnosis not present

## 2019-07-29 DIAGNOSIS — E119 Type 2 diabetes mellitus without complications: Secondary | ICD-10-CM | POA: Diagnosis not present

## 2019-07-29 DIAGNOSIS — I10 Essential (primary) hypertension: Secondary | ICD-10-CM | POA: Diagnosis not present

## 2019-07-29 DIAGNOSIS — K219 Gastro-esophageal reflux disease without esophagitis: Secondary | ICD-10-CM | POA: Diagnosis not present

## 2019-07-29 DIAGNOSIS — F418 Other specified anxiety disorders: Secondary | ICD-10-CM | POA: Diagnosis not present

## 2019-07-29 DIAGNOSIS — Z6841 Body Mass Index (BMI) 40.0 and over, adult: Secondary | ICD-10-CM | POA: Diagnosis not present

## 2019-07-30 DIAGNOSIS — E119 Type 2 diabetes mellitus without complications: Secondary | ICD-10-CM | POA: Diagnosis not present

## 2019-08-22 DIAGNOSIS — R6520 Severe sepsis without septic shock: Secondary | ICD-10-CM | POA: Diagnosis not present

## 2019-08-22 DIAGNOSIS — U071 COVID-19: Secondary | ICD-10-CM | POA: Diagnosis not present

## 2019-08-22 DIAGNOSIS — A419 Sepsis, unspecified organism: Secondary | ICD-10-CM | POA: Diagnosis not present

## 2019-08-22 DIAGNOSIS — J181 Lobar pneumonia, unspecified organism: Secondary | ICD-10-CM | POA: Diagnosis not present

## 2019-09-09 NOTE — Progress Notes (Signed)
Patient Care Team: Scotty Court, DO as PCP - General  DIAGNOSIS:    ICD-10-CM   1. Bilateral pulmonary embolism (HCC)  I26.99     CHIEF COMPLIANT: Follow-up of DVT, PE  INTERVAL HISTORY: Regina Munoz is a 69 y.o. with above-mentioned history of extensive,progressive bilateral lower extremity DVT and PE while hospitalized for a COVID-19 infection. She is currently on anticoagulation with Xarelto. She presents to the clinic today for follow-up.  She has been tolerating Xarelto extremely well without any problems or concerns.  No signs or symptoms of bleeding.  ALLERGIES:  has No Known Allergies.  MEDICATIONS:  Current Outpatient Medications  Medication Sig Dispense Refill  . acetaminophen (TYLENOL) 500 MG tablet Take 1,000 mg by mouth every 6 (six) hours as needed for mild pain or moderate pain.     Marland Kitchen albuterol (VENTOLIN HFA) 108 (90 Base) MCG/ACT inhaler Inhale 2 puffs into the lungs every 4 (four) hours as needed for wheezing or shortness of breath. 8 g 0  . ascorbic acid (VITAMIN C) 500 MG tablet Take 1 tablet (500 mg total) by mouth daily. 30 tablet 0  . blood glucose meter kit and supplies KIT Dispense based on patient and insurance preference. Use up to four times daily as directed. (FOR ICD-9 250.00, 250.01). 1 each 0  . buPROPion (ZYBAN) 150 MG 12 hr tablet Take 75 mg by mouth every morning.     . cetirizine (ZYRTEC) 10 MG tablet Take 10 mg by mouth daily.    . cholecalciferol (VITAMIN D) 1000 UNITS tablet Take 1,000 Units by mouth daily.    Marland Kitchen dextromethorphan-guaiFENesin (MUCINEX DM) 30-600 MG 12hr tablet Take 1 tablet by mouth 2 (two) times daily. 15 tablet 0  . diclofenac Sodium (VOLTAREN) 1 % GEL Apply 2 g topically daily as needed (for pain).     . Insulin Glargine (LANTUS) 100 UNIT/ML Solostar Pen Inject 10 Units into the skin daily. 15 mL 11  . Insulin Pen Needle (PEN NEEDLES 3/16") 31G X 5 MM MISC 1 application by Does not apply route daily. 30 each 10  .  metFORMIN (GLUCOPHAGE) 500 MG tablet Take 500 mg by mouth 2 (two) times daily with a meal.     . omeprazole (PRILOSEC) 20 MG capsule Take 20 mg by mouth daily.    . polyethylene glycol (MIRALAX / GLYCOLAX) 17 g packet Take 17 g by mouth daily as needed for mild constipation. 14 each 0  . rivaroxaban (XARELTO) 20 MG TABS tablet Take 1 tablet (20 mg total) by mouth daily with supper. 90 tablet 3  . tiotropium (SPIRIVA HANDIHALER) 18 MCG inhalation capsule Place 1 capsule (18 mcg total) into inhaler and inhale 2 (two) times daily. 30 capsule 12  . umeclidinium-vilanterol (ANORO ELLIPTA) 62.5-25 MCG/INH AEPB Inhale 1 puff into the lungs daily.    Marland Kitchen zinc sulfate 220 (50 Zn) MG capsule Take 1 capsule (220 mg total) by mouth daily. 30 capsule 0   No current facility-administered medications for this visit.    PHYSICAL EXAMINATION: ECOG PERFORMANCE STATUS: 1 - Symptomatic but completely ambulatory  There were no vitals filed for this visit. There were no vitals filed for this visit.  LABORATORY DATA:  I have reviewed the data as listed CMP Latest Ref Rng & Units 05/20/2019 05/19/2019 05/18/2019  Glucose 70 - 99 mg/dL 139(H) 127(H) 194(H)  BUN 8 - 23 mg/dL 8 9 13   Creatinine 2.97 - 1.00 mg/dL 0.76 0.73 0.76  Sodium 135 -  145 mmol/L 138 137 134(L)  Potassium 3.5 - 5.1 mmol/L 4.1 3.4(L) 3.8  Chloride 98 - 111 mmol/L 111 112(H) 110  CO2 22 - 32 mmol/L 19(L) 18(L) 16(L)  Calcium 8.9 - 10.3 mg/dL 8.3(L) 8.3(L) 8.0(L)  Total Protein 6.5 - 8.1 g/dL - 5.3(L) -  Total Bilirubin 0.3 - 1.2 mg/dL - 0.7 -  Alkaline Phos 38 - 126 U/L - 67 -  AST 15 - 41 U/L - 27 -  ALT 0 - 44 U/L - 12 -    Lab Results  Component Value Date   WBC 7.7 09/10/2019   HGB 13.0 09/10/2019   HCT 42.1 09/10/2019   MCV 81.6 09/10/2019   PLT 147 (L) 09/10/2019   NEUTROABS 3.7 09/10/2019    ASSESSMENT & PLAN:  Bilateral pulmonary embolism (HCC) Bilateral PE with right-sided heart strain and extensive bilateral lower extremity  DVTs with extension into the iliac and IVC. IVC filter placed Mechanical thrombectomy Retroperitoneal hematoma led to severe anemia requiring blood transfusion Lab review:  06/11/2019: Hemoglobin 12.1 06/12/2019: Hemoglobin 13   Current treatment: Anticoagulation with Xarelto for 1 year (started 05/05/2019) Xarelto toxicities: None Etiology: Covid pneumonia related thromboembolic disease.  Return to clinic in December for follow-up       No orders of the defined types were placed in this encounter.  The patient has a good understanding of the overall plan. she agrees with it. she will call with any problems that may develop before the next visit here.  Total time spent: 20 mins including face to face time and time spent for planning, charting and coordination of care  Nicholas Lose, MD 09/10/2019  I, Cloyde Reams Dorshimer, am acting as scribe for Dr. Nicholas Lose.  I have reviewed the above documentation for accuracy and completeness, and I agree with the above.

## 2019-09-10 ENCOUNTER — Other Ambulatory Visit: Payer: Self-pay

## 2019-09-10 ENCOUNTER — Inpatient Hospital Stay: Payer: PPO | Attending: Hematology and Oncology

## 2019-09-10 ENCOUNTER — Inpatient Hospital Stay: Payer: PPO | Admitting: Hematology and Oncology

## 2019-09-10 DIAGNOSIS — D649 Anemia, unspecified: Secondary | ICD-10-CM | POA: Insufficient documentation

## 2019-09-10 DIAGNOSIS — Z7901 Long term (current) use of anticoagulants: Secondary | ICD-10-CM | POA: Diagnosis not present

## 2019-09-10 DIAGNOSIS — Z79899 Other long term (current) drug therapy: Secondary | ICD-10-CM | POA: Insufficient documentation

## 2019-09-10 DIAGNOSIS — I82403 Acute embolism and thrombosis of unspecified deep veins of lower extremity, bilateral: Secondary | ICD-10-CM | POA: Diagnosis not present

## 2019-09-10 DIAGNOSIS — K661 Hemoperitoneum: Secondary | ICD-10-CM | POA: Insufficient documentation

## 2019-09-10 DIAGNOSIS — I2699 Other pulmonary embolism without acute cor pulmonale: Secondary | ICD-10-CM | POA: Diagnosis not present

## 2019-09-10 DIAGNOSIS — Z86718 Personal history of other venous thrombosis and embolism: Secondary | ICD-10-CM | POA: Insufficient documentation

## 2019-09-10 LAB — CBC WITH DIFFERENTIAL (CANCER CENTER ONLY)
Abs Immature Granulocytes: 0.04 10*3/uL (ref 0.00–0.07)
Basophils Absolute: 0.1 10*3/uL (ref 0.0–0.1)
Basophils Relative: 1 %
Eosinophils Absolute: 0.1 10*3/uL (ref 0.0–0.5)
Eosinophils Relative: 2 %
HCT: 42.1 % (ref 36.0–46.0)
Hemoglobin: 13 g/dL (ref 12.0–15.0)
Immature Granulocytes: 1 %
Lymphocytes Relative: 41 %
Lymphs Abs: 3.2 10*3/uL (ref 0.7–4.0)
MCH: 25.2 pg — ABNORMAL LOW (ref 26.0–34.0)
MCHC: 30.9 g/dL (ref 30.0–36.0)
MCV: 81.6 fL (ref 80.0–100.0)
Monocytes Absolute: 0.6 10*3/uL (ref 0.1–1.0)
Monocytes Relative: 8 %
Neutro Abs: 3.7 10*3/uL (ref 1.7–7.7)
Neutrophils Relative %: 47 %
Platelet Count: 147 10*3/uL — ABNORMAL LOW (ref 150–400)
RBC: 5.16 MIL/uL — ABNORMAL HIGH (ref 3.87–5.11)
RDW: 18.8 % — ABNORMAL HIGH (ref 11.5–15.5)
WBC Count: 7.7 10*3/uL (ref 4.0–10.5)
nRBC: 0 % (ref 0.0–0.2)

## 2019-09-10 NOTE — Assessment & Plan Note (Signed)
Bilateral PE with right-sided heart strain and extensive bilateral lower extremity DVTs with extension into the iliac and IVC. IVC filter placed Mechanical thrombectomy Retroperitoneal hematoma led to severe anemia requiring blood transfusion Lab review:  06/11/2019: Hemoglobin 12.1 06/12/2019: Hemoglobin   Current treatment: Anticoagulation with Xarelto for 1 year (started 05/05/2019) Xarelto toxicities: None Etiology: Covid pneumonia related thromboembolic disease.  Return to clinic in December for follow-up

## 2019-09-13 ENCOUNTER — Telehealth: Payer: Self-pay | Admitting: Hematology and Oncology

## 2019-09-13 NOTE — Telephone Encounter (Signed)
Scheduled per 04/27 los, patient has been called and notified.  

## 2019-09-18 DIAGNOSIS — Z23 Encounter for immunization: Secondary | ICD-10-CM | POA: Diagnosis not present

## 2019-09-19 ENCOUNTER — Emergency Department (HOSPITAL_COMMUNITY)
Admission: EM | Admit: 2019-09-19 | Discharge: 2019-09-19 | Disposition: A | Discharge status: Home / Self Care | Payer: PPO | Attending: Emergency Medicine | Admitting: Emergency Medicine

## 2019-09-19 ENCOUNTER — Emergency Department (HOSPITAL_COMMUNITY): Payer: PPO

## 2019-09-19 ENCOUNTER — Other Ambulatory Visit: Payer: Self-pay

## 2019-09-19 DIAGNOSIS — Z96653 Presence of artificial knee joint, bilateral: Secondary | ICD-10-CM | POA: Diagnosis not present

## 2019-09-19 DIAGNOSIS — W19XXXA Unspecified fall, initial encounter: Secondary | ICD-10-CM | POA: Diagnosis not present

## 2019-09-19 DIAGNOSIS — Z23 Encounter for immunization: Secondary | ICD-10-CM | POA: Diagnosis not present

## 2019-09-19 DIAGNOSIS — S81812A Laceration without foreign body, left lower leg, initial encounter: Secondary | ICD-10-CM | POA: Diagnosis not present

## 2019-09-19 DIAGNOSIS — J45909 Unspecified asthma, uncomplicated: Secondary | ICD-10-CM | POA: Diagnosis not present

## 2019-09-19 DIAGNOSIS — S71112A Laceration without foreign body, left thigh, initial encounter: Secondary | ICD-10-CM | POA: Diagnosis not present

## 2019-09-19 DIAGNOSIS — Y929 Unspecified place or not applicable: Secondary | ICD-10-CM | POA: Diagnosis not present

## 2019-09-19 DIAGNOSIS — Y9389 Activity, other specified: Secondary | ICD-10-CM | POA: Diagnosis not present

## 2019-09-19 DIAGNOSIS — Z7984 Long term (current) use of oral hypoglycemic drugs: Secondary | ICD-10-CM | POA: Insufficient documentation

## 2019-09-19 DIAGNOSIS — R42 Dizziness and giddiness: Secondary | ICD-10-CM | POA: Diagnosis not present

## 2019-09-19 DIAGNOSIS — R0902 Hypoxemia: Secondary | ICD-10-CM | POA: Diagnosis not present

## 2019-09-19 DIAGNOSIS — Z96641 Presence of right artificial hip joint: Secondary | ICD-10-CM | POA: Insufficient documentation

## 2019-09-19 DIAGNOSIS — M79605 Pain in left leg: Secondary | ICD-10-CM | POA: Diagnosis not present

## 2019-09-19 DIAGNOSIS — E119 Type 2 diabetes mellitus without complications: Secondary | ICD-10-CM | POA: Diagnosis not present

## 2019-09-19 DIAGNOSIS — Z79899 Other long term (current) drug therapy: Secondary | ICD-10-CM | POA: Insufficient documentation

## 2019-09-19 DIAGNOSIS — Y999 Unspecified external cause status: Secondary | ICD-10-CM | POA: Insufficient documentation

## 2019-09-19 DIAGNOSIS — Z7901 Long term (current) use of anticoagulants: Secondary | ICD-10-CM | POA: Insufficient documentation

## 2019-09-19 DIAGNOSIS — R58 Hemorrhage, not elsewhere classified: Secondary | ICD-10-CM | POA: Diagnosis not present

## 2019-09-19 DIAGNOSIS — W010XXA Fall on same level from slipping, tripping and stumbling without subsequent striking against object, initial encounter: Secondary | ICD-10-CM | POA: Diagnosis not present

## 2019-09-19 DIAGNOSIS — Z8616 Personal history of COVID-19: Secondary | ICD-10-CM | POA: Diagnosis not present

## 2019-09-19 LAB — CBC WITH DIFFERENTIAL/PLATELET
Abs Immature Granulocytes: 0.07 10*3/uL (ref 0.00–0.07)
Basophils Absolute: 0 10*3/uL (ref 0.0–0.1)
Basophils Relative: 1 %
Eosinophils Absolute: 0.1 10*3/uL (ref 0.0–0.5)
Eosinophils Relative: 1 %
HCT: 42.6 % (ref 36.0–46.0)
Hemoglobin: 13.2 g/dL (ref 12.0–15.0)
Immature Granulocytes: 1 %
Lymphocytes Relative: 14 %
Lymphs Abs: 1 10*3/uL (ref 0.7–4.0)
MCH: 25.2 pg — ABNORMAL LOW (ref 26.0–34.0)
MCHC: 31 g/dL (ref 30.0–36.0)
MCV: 81.5 fL (ref 80.0–100.0)
Monocytes Absolute: 0.6 10*3/uL (ref 0.1–1.0)
Monocytes Relative: 9 %
Neutro Abs: 5.6 10*3/uL (ref 1.7–7.7)
Neutrophils Relative %: 74 %
Platelets: 139 10*3/uL — ABNORMAL LOW (ref 150–400)
RBC: 5.23 MIL/uL — ABNORMAL HIGH (ref 3.87–5.11)
RDW: 19.9 % — ABNORMAL HIGH (ref 11.5–15.5)
WBC: 7.4 10*3/uL (ref 4.0–10.5)
nRBC: 0 % (ref 0.0–0.2)

## 2019-09-19 LAB — BASIC METABOLIC PANEL
Anion gap: 9 (ref 5–15)
BUN: 17 mg/dL (ref 8–23)
CO2: 24 mmol/L (ref 22–32)
Calcium: 9.3 mg/dL (ref 8.9–10.3)
Chloride: 100 mmol/L (ref 98–111)
Creatinine, Ser: 0.95 mg/dL (ref 0.44–1.00)
GFR calc Af Amer: >60 mL/min (ref >60)
GFR calc non Af Amer: >60 mL/min (ref >60)
Glucose, Bld: 121 mg/dL — ABNORMAL HIGH (ref 70–99)
Potassium: 4.3 mmol/L (ref 3.5–5.1)
Sodium: 133 mmol/L — ABNORMAL LOW (ref 135–145)

## 2019-09-19 MED ORDER — TETANUS-DIPHTH-ACELL PERTUSSIS 5-2.5-18.5 LF-MCG/0.5 IM SUSP
0.5000 mL | Freq: Once | INTRAMUSCULAR | Status: AC
  Administered 2019-09-19: 19:00:00 0.5 mL via INTRAMUSCULAR
  Filled 2019-09-19: qty 0.5

## 2019-09-19 MED ORDER — SODIUM CHLORIDE 0.9 % IV BOLUS
1000.0000 mL | Freq: Once | INTRAVENOUS | Status: AC
  Administered 2019-09-19: 19:00:00 1000 mL via INTRAVENOUS

## 2019-09-19 MED ORDER — BACITRACIN ZINC 500 UNIT/GM EX OINT
TOPICAL_OINTMENT | Freq: Once | CUTANEOUS | Status: AC
  Administered 2019-09-19: 1 via TOPICAL
  Filled 2019-09-19: qty 0.9

## 2019-09-19 MED ORDER — ONDANSETRON HCL 4 MG/2ML IJ SOLN
4.0000 mg | Freq: Once | INTRAMUSCULAR | Status: AC
  Administered 2019-09-19: 19:00:00 4 mg via INTRAVENOUS
  Filled 2019-09-19: qty 2

## 2019-09-19 MED ORDER — HYDROCODONE-ACETAMINOPHEN 5-325 MG PO TABS
1.0000 | ORAL_TABLET | Freq: Once | ORAL | Status: AC
  Administered 2019-09-19: 20:00:00 1 via ORAL
  Filled 2019-09-19: qty 1

## 2019-09-19 MED ORDER — LIDOCAINE-EPINEPHRINE (PF) 2 %-1:200000 IJ SOLN
20.0000 mL | Freq: Once | INTRAMUSCULAR | Status: AC
  Administered 2019-09-19: 20 mL via INTRADERMAL
  Filled 2019-09-19: qty 20

## 2019-09-19 NOTE — ED Provider Notes (Signed)
[facility] EMERGENCY DEPARTMENT Provider Note   CSN: [id number] Arrival date & time: 09/19/19  1815     History Chief Complaint  Patient presents with  . Fall    Regina Munoz is a 69 y.o. female past no history of COVID-19, PE (currently on Xarelto) who presents for evaluation of laceration after a fall.  Patient reports that she got her first Covid vaccine yesterday.  Today, she had been feeling lightheaded, weak.  She had had some fatigue, palpitations and nausea.  She reports she bent down to pick something up and states that when she bent down, she got lightheaded and fell backwards.  She reports that there was a picture frame that was hanging near the ground that dropped and broke and she landed on it, causing a laceration to the posterior aspect of her left leg.  She did not have any preceding chest pain.  Denies any head injury, LOC.  She has been able to walk since this happened.  Also sustained a small skin tear noted to her left forearm.  She does not know when her last tetanus shot was. She denies any numbness/weakness.    The history is provided by the patient.       Past Medical History:  Diagnosis Date  . Anxiety   . Arthritis   . Asthma   . Complication of anesthesia   . COVID-19   . Diabetes mellitus without complication ([place])   . Dysrhythmia    palpitations ->20 yrs stress test neg nothing since  . GERD (gastroesophageal reflux disease)   . Headache   . PONV (postoperative nausea and vomiting)     Patient Active Problem List   Diagnosis Date Noted  . Severe sepsis ([place]) 05/13/2019  . Lobar pneumonia ([place]) 05/13/2019  . SIRS (systemic inflammatory response syndrome) ([place]) 05/11/2019  . DVT (deep venous thrombosis) ([place]) 05/11/2019  . Anxiety 05/11/2019  . Depression 05/11/2019  . Asthma 05/11/2019  . Lactic acidosis 05/11/2019  . Hypoxia 05/02/2019  . 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 Bilateral pulmonary embolism ([place])   . Pneumonia due to  COVID-19 virus 04/21/2019  . Neck pain 02/17/2016    Past Surgical History:  Procedure Laterality Date  . ABDOMINAL HYSTERECTOMY    . ANTERIOR CERVICAL DECOMP/DISCECTOMY FUSION N/A 02/17/2016   Procedure: ANTERIOR CERVICAL DECOMPRESSION/DISCECTOMY FUSION 1 LEVEL C4-5;  Surgeon: Melina Schools, MD;  Location: [facility];  Service: Orthopedics;  Laterality: N/A;  . EYE SURGERY Bilateral    cataracts  . HIP CLOSED REDUCTION Right 12/24/2012   Procedure: CLOSED REDUCTION HIP;  Surgeon: Sinclair Ship, MD;  Location: [facility];  Service: Orthopedics;  Laterality: Right;  . IR IVC FILTER PLMT / S&I Burke Keels GUID/MOD SED  04/29/2019  . IR PTA VENOUS EXCEPT DIALYSIS CIRCUIT  05/16/2019  . IR RADIOLOGIST EVAL & MGMT  06/18/2019  . IR THROMBECT SEC MECH MOD SED  05/16/2019  . IR THROMBECT VENO MECH MOD SED  05/16/2019  . IR US GUIDE VASC ACCESS LEFT  05/16/2019  . IR US GUIDE VASC ACCESS RIGHT  05/16/2019  . IR US GUIDE VASC ACCESS RIGHT  05/16/2019  . IR VENO/EXT/BI  05/16/2019  . IR VENOCAVAGRAM IVC  05/16/2019  . JOINT REPLACEMENT  2002   right and left knees  . SHOULDER ARTHROSCOPY Left   . TOTAL HIP ARTHROPLASTY Right    2002     OB History   No obstetric history on file.  No family history on file.  Social History   Tobacco Use  . Smoking status: Never Smoker  . Smokeless tobacco: Never Used  Substance Use Topics  . Alcohol use: No  . Drug use: No    Home Medications Prior to Admission medications   Medication Sig Start Date End Date Taking? Authorizing Provider  acetaminophen (TYLENOL) 500 MG tablet Take 1,000 mg by mouth every 6 (six) hours as needed for mild pain or moderate pain.     [provider]  albuterol (VENTOLIN HFA) 108 (90 Base) MCG/ACT inhaler Inhale 2 puffs into the lungs every 4 (four) hours as needed for wheezing or shortness of breath. 05/02/19   Regalado, Belkys A, MD  ascorbic acid (VITAMIN C) 500 MG tablet Take 1 tablet (500 mg total) by mouth  daily. 05/02/19   Regalado, Belkys A, MD  blood glucose meter kit and supplies KIT Dispense based on patient and insurance preference. Use up to four times daily as directed. (FOR ICD-9 250.00, 250.01). 05/02/19   Regalado, Belkys A, MD  buPROPion (ZYBAN) 150 MG 12 hr tablet Take 75 mg by mouth every morning.  03/28/19   [provider]  cetirizine (ZYRTEC) 10 MG tablet Take 10 mg by mouth daily. 02/13/19   [provider]  cholecalciferol (VITAMIN D) 1000 UNITS tablet Take 1,000 Units by mouth daily.    [provider]  dextromethorphan-guaiFENesin (MUCINEX DM) 30-600 MG 12hr tablet Take 1 tablet by mouth 2 (two) times daily. 05/02/19   Regalado, Belkys A, MD  diclofenac Sodium (VOLTAREN) 1 % GEL Apply 2 g topically daily as needed (for pain).     [provider]  Insulin Glargine (LANTUS) 100 UNIT/ML Solostar Pen Inject 10 Units into the skin daily. 05/02/19   Regalado, Belkys A, MD  Insulin Pen Needle (PEN NEEDLES 3/16") 31G X 5 MM MISC 1 application by Does not apply route daily. 05/02/19   Regalado, Belkys A, MD  metFORMIN (GLUCOPHAGE) 500 MG tablet Take 500 mg by mouth 2 (two) times daily with a meal.     [provider]  omeprazole (PRILOSEC) 20 MG capsule Take 20 mg by mouth daily.    [provider]  polyethylene glycol (MIRALAX / GLYCOLAX) 17 g packet Take 17 g by mouth daily as needed for mild constipation. 05/02/19   Regalado, Belkys A, MD  rivaroxaban (XARELTO) 20 MG TABS tablet Take 1 tablet (20 mg total) by mouth daily with supper. 06/11/19   Nicholas Lose, MD  tiotropium (SPIRIVA HANDIHALER) 18 MCG inhalation capsule Place 1 capsule (18 mcg total) into inhaler and inhale 2 (two) times daily. 05/02/19   Regalado, Belkys A, MD  umeclidinium-vilanterol (ANORO ELLIPTA) 62.5-25 MCG/INH AEPB Inhale 1 puff into the lungs daily. 06/11/19   Nicholas Lose, MD  zinc sulfate 220 (50 Zn) MG capsule Take 1 capsule (220 mg total) by mouth daily.  05/02/19   Regalado, Cassie Freer, MD    Allergies    Patient has no known allergies.  Review of Systems   Review of Systems  Constitutional: Positive for fatigue. Negative for fever.  Respiratory: Negative for shortness of breath.   Cardiovascular: Negative for chest pain.  Gastrointestinal: Positive for nausea. Negative for abdominal pain and vomiting.  Skin: Positive for wound.  Neurological: Positive for light-headedness. Negative for headaches.  All other systems reviewed and are negative.   Physical Exam Updated Vital Signs BP 92/79 (BP Location: Right Arm)   Pulse (!) 105   Temp  99.4 F (37.4 C) (Oral)   Resp 16   Ht '5\' 5"'$  (1.651 m)   Wt 96.2 kg   SpO2 93%   BMI 35.28 kg/m   Physical Exam Vitals and nursing note reviewed.  Constitutional:      Appearance: Normal appearance. She is well-developed.  HENT:     Head: Normocephalic and atraumatic.  Eyes:     General: Lids are normal.     Conjunctiva/sclera: Conjunctivae normal.     Pupils: Pupils are equal, round, and reactive to light.  Neck:     Comments: Full flexion/extension and lateral movement of neck fully intact. No bony midline tenderness. No deformities or crepitus.  Cardiovascular:     Rate and Rhythm: Normal rate and regular rhythm.     Pulses: Normal pulses.          Dorsalis pedis pulses are 2+ on the right side and 2+ on the left side.     Heart sounds: Normal heart sounds. No murmur. No friction rub. No gallop.   Pulmonary:     Effort: Pulmonary effort is normal.     Breath sounds: Normal breath sounds.     Comments: Lungs clear to auscultation bilaterally.  Symmetric chest rise.  No wheezing, rales, rhonchi. Abdominal:     Palpations: Abdomen is soft. Abdomen is not rigid.     Tenderness: There is no abdominal tenderness. There is no guarding.  Musculoskeletal:        General: Normal range of motion.     Cervical back: Full passive range of motion without pain.     Comments: 5 cm linear  laceration to the posterior aspect of left femur.  Full flexion/tension of left lower extremity intact any difficulty.  She is a small skin tear noted to the left forearm. No midline T or L spine tenderness.   Skin:    General: Skin is warm and dry.     Capillary Refill: Capillary refill takes less than 2 seconds.     Comments: Small 2 cm skin tear noted to the left forearm.  No deep laceration.  Neurological:     Mental Status: She is alert and oriented to person, place, and time.     Comments: Cranial nerves III-XII intact Follows commands, Moves all extremities  5/5 strength to BUE and BLE  Sensation intact throughout all major nerve distributions No gait abnormalities  No slurred speech. No facial droop.   Psychiatric:        Speech: Speech normal.         ED Results / Procedures / Treatments   Labs (all labs ordered are listed, but only abnormal results are displayed) Labs Reviewed  BASIC METABOLIC PANEL - Abnormal; Notable for the following components:      Result Value   Sodium 133 (*)    Glucose, Bld 121 (*)    All other components within normal limits  CBC WITH DIFFERENTIAL/PLATELET - Abnormal; Notable for the following components:   RBC 5.23 (*)    MCH 25.2 (*)    RDW 19.9 (*)    Platelets 139 (*)    All other components within normal limits    EKG EKG Interpretation  Date/Time:  Thursday Sep 19 2019 18:57:30 EDT Ventricular Rate:  105 PR Interval:    QRS Duration: 93 QT Interval:  337 QTC Calculation: 446 R Axis:   82 Text Interpretation: Sinus tachycardia Ventricular premature complex Borderline right axis deviation Low voltage, extremity and precordial leads Artifact  Confirmed by Fredia Sorrow [phone]) on 09/19/2019 7:14:21 PM   Radiology DG Femur Min 2 Views Left  Result Date: 09/19/2019 CLINICAL DATA:  Leg pain EXAM: LEFT FEMUR 2 VIEWS COMPARISON:  None. FINDINGS: No fracture or malalignment. Left knee replacement. No radiopaque foreign body  IMPRESSION: No acute osseous abnormality Electronically Signed   By: Donavan Foil M.D.   On: 09/19/2019 19:47    Procedures .Marland KitchenLaceration Repair  Date/Time: 09/19/2019 8:52 PM Performed by: Volanda Napoleon, PA-C Authorized by: Volanda Napoleon, PA-C   Consent:    Consent obtained:  Verbal   Consent given by:  Patient   Risks discussed:  Infection, need for additional repair, pain, poor cosmetic result and poor wound healing   Alternatives discussed:  No treatment and delayed treatment Universal protocol:    Procedure explained and questions answered to patient or proxy's satisfaction: yes     Relevant documents present and verified: yes     Test results available and properly labeled: yes     Imaging studies available: yes     Required blood products, implants, devices, and special equipment available: yes     Site/side marked: yes     Immediately prior to procedure, a time out was called: yes     Patient identity confirmed:  Verbally with patient Anesthesia (see MAR for exact dosages):    Anesthesia method:  Local infiltration   Local anesthetic:  Lidocaine 2% WITH epi Laceration details:    Location:  Leg   Leg location:  L upper leg   Length (cm):  5 Repair type:    Repair type:  Intermediate Pre-procedure details:    Preparation:  Patient was prepped and draped in usual sterile fashion and imaging obtained to evaluate for foreign bodies Exploration:    Hemostasis achieved with:  Direct pressure   Wound exploration: wound explored through full range of motion     Wound extent: no foreign bodies/material noted and no tendon damage noted   Treatment:    Area cleansed with:  Betadine   Amount of cleaning:  Extensive   Irrigation solution:  Sterile saline   Irrigation method:  Syringe Subcutaneous repair:    Suture size:  4-0   Suture material:  Vicryl   Suture technique:  Simple interrupted   Number of sutures:  3 Skin repair:    Repair method:  Sutures   Suture  size:  4-0   Suture material:  Nylon   Suture technique:  Simple interrupted and horizontal mattress   Number of sutures:  6 Approximation:    Approximation:  Close Post-procedure details:    Dressing:  Antibiotic ointment and non-adherent dressing   Patient tolerance of procedure:  Tolerated well, no immediate complications        Medications Ordered in ED Medications  sodium chloride 0.9 % bolus 1,000 mL (1,000 mLs Intravenous New Bag/Given 09/19/19 1906)  ondansetron (ZOFRAN) injection 4 mg (4 mg Intravenous Given 09/19/19 1906)  Tdap (BOOSTRIX) injection 0.5 mL (0.5 mLs Intramuscular Given 09/19/19 1911)  lidocaine-EPINEPHrine (XYLOCAINE W/EPI) 2 %-1:200000 (PF) injection 20 mL (20 mLs Intradermal Given 09/19/19 1943)  HYDROcodone-acetaminophen (NORCO/VICODIN) 5-325 MG per tablet 1 tablet (1 tablet Oral Given 09/19/19 1957)  bacitracin ointment (1 application Topical Given 09/19/19 2121)    ED Course  I have reviewed the triage vital signs and the nursing notes.  Pertinent labs & imaging results that were available during my care of the patient were reviewed by me and considered in  my medical decision making (see chart for details).    MDM Rules/Calculators/A&P                      69 year old female who presents for evaluation after a fall that occurred earlier this afternoon. Reports that she got her first Covid vaccine yesterday and since then has felt slightly lightheaded, nauseous. She was bending down and when she bent down, she got lightheaded which caused her to fall and she landed on a glass picture frame. No head injury, LOC. She has been able to ambulate since this happened. She is on Xarelto. Patient is afebrile, non-toxic appearing, sitting comfortably on examination table. Vital signs reviewed and stable.  Patient is neurovascularly intact. On exam, she has a 5 cm linear laceration noted to posterior thigh. She also has a small skin tear noted to her anterior arm. Plan for  wound care, tetanus update. Will plan to hydrate her up giving lightheadedness and just check basic labs. Additionally, plan to update tetanus.  Patient reports that she did not hit her head and states that she landed on her buttocks and sat down on the glass.  She did not have any LOC.  No indication for CT head.  We will plan for imaging of the femur to ensure that there is no retained foreign body.  I suspect that her fall was most likely due to near syncope symptoms secondary to feeling lightheaded from her Covid vaccine yesterday.  Will check basic labs, give fluids while here in the ED.  BMP shows BUN and creatinine within normal limits.  CBC shows no leukocytosis.  Hemoglobin is stable.  X-ray femur shows no evidence of acute bony abnormality or foreign body noted.  Laceration repaired as documented above.  Given the extensiveness of the wound, a deep layer of sutures was added and then for the skin closure, a combination of horizontal mattress and simple interrupted was used with good closure as seen in photo above. Patient instructed on at home wound care instructions. At this time, patient exhibits no emergent life-threatening condition that require further evaluation in ED or admission. Discussed patient with Dr. Rogene Houston who is agreeable to plan. Patient had ample opportunity for questions and discussion. All patient's questions were answered with full understanding. Strict return precautions discussed. Patient expresses understanding and agreement to plan.   Portions of this note were generated with [employer]. Dictation errors may occur despite best attempts at proofreading.  Final Clinical Impression(s) / ED Diagnoses Final diagnoses:  Fall, initial encounter  Laceration of left lower extremity, initial encounter  Lightheaded    Rx / DC Orders ED Discharge Orders    None       Desma Mcgregor 09/19/19 2125    Fredia Sorrow, MD 09/19/19 2128

## 2019-09-19 NOTE — ED Provider Notes (Signed)
Medical screening examination/treatment/procedure(s) were conducted as a shared visit with non-physician practitioner(s) and myself.  I personally evaluated the patient during the encounter.  EKG Interpretation  Date/Time:  Thursday Sep 19 2019 18:57:30 EDT Ventricular Rate:  105 PR Interval:    QRS Duration: 93 QT Interval:  337 QTC Calculation: 446 R Axis:   82 Text Interpretation: Sinus tachycardia Ventricular premature complex Borderline right axis deviation Low voltage, extremity and precordial leads Artifact Confirmed by Vanetta Mulders [phone]) on 09/19/2019 7:14:21 PM   Patient seen by me along with physician assistant.  Patient was not feeling well today after having her first [place] vaccination yesterday.  Patient did have Covid known infection back in November.  And was complicated by lots of blood clots.  Patient may have had a little bit of near syncopal or presyncopal type episode fell into a picture frame that was leaning up against the wall waiting to be home.  The glass broke and cut her left thigh posteriorly with a lot of bleeding.  And kind of a superficial avulsion type laceration to her left fore arm.  No loss of consciousness did not hit her head.  No other specific complaints.  X-rays of the left femur are pending.  Mostly to probably rule out foreign body.  Make sure that is no bony injury.  Patient receiving some IV fluids.  Patient will require laceration repair of the left posterior thigh laceration.  The laceration now measures approximately 5 cm.  Bleeding currently controlled.   Vanetta Mulders, MD 09/19/19 [phone]

## 2019-09-19 NOTE — Discharge Instructions (Signed)
Keep the wound clean and dry for the first 24 hours. After that you may gently clean the wound with soap and water. Make sure to pat dry the wound before covering it with any dressing. You can use topical antibiotic ointment and bandage. Ice and elevate for pain relief.  ° °You can take Tylenol or Ibuprofen as directed for pain. You can alternate Tylenol and Ibuprofen every 4 hours for additional pain relief.  ° °Return to the Emergency Department, your primary care doctor, or the [facility] in 7-10 days for suture removal.  ° °Monitor closely for any signs of infection. Return to the Emergency Department for any worsening redness/swelling of the area that begins to spread, drainage from the site, worsening pain, fever or any other worsening or concerning symptoms.  ° ° °

## 2019-09-19 NOTE — ED Triage Notes (Signed)
Pt fell on glass picture frame at home. No LOC. Laceration to left hamstring.

## 2019-09-21 DIAGNOSIS — A419 Sepsis, unspecified organism: Secondary | ICD-10-CM | POA: Diagnosis not present

## 2019-09-21 DIAGNOSIS — J181 Lobar pneumonia, unspecified organism: Secondary | ICD-10-CM | POA: Diagnosis not present

## 2019-09-21 DIAGNOSIS — R6520 Severe sepsis without septic shock: Secondary | ICD-10-CM | POA: Diagnosis not present

## 2019-09-21 DIAGNOSIS — U071 COVID-19: Secondary | ICD-10-CM | POA: Diagnosis not present

## 2019-09-27 DIAGNOSIS — S71112A Laceration without foreign body, left thigh, initial encounter: Secondary | ICD-10-CM | POA: Diagnosis not present

## 2019-09-27 DIAGNOSIS — Z4889 Encounter for other specified surgical aftercare: Secondary | ICD-10-CM | POA: Diagnosis not present

## 2019-10-16 DIAGNOSIS — Z23 Encounter for immunization: Secondary | ICD-10-CM | POA: Diagnosis not present

## 2019-10-22 DIAGNOSIS — J181 Lobar pneumonia, unspecified organism: Secondary | ICD-10-CM | POA: Diagnosis not present

## 2019-10-22 DIAGNOSIS — U071 COVID-19: Secondary | ICD-10-CM | POA: Diagnosis not present

## 2019-10-22 DIAGNOSIS — R6520 Severe sepsis without septic shock: Secondary | ICD-10-CM | POA: Diagnosis not present

## 2019-10-22 DIAGNOSIS — A419 Sepsis, unspecified organism: Secondary | ICD-10-CM | POA: Diagnosis not present

## 2019-10-29 DIAGNOSIS — I10 Essential (primary) hypertension: Secondary | ICD-10-CM | POA: Diagnosis not present

## 2019-10-29 DIAGNOSIS — E782 Mixed hyperlipidemia: Secondary | ICD-10-CM | POA: Diagnosis not present

## 2019-10-29 DIAGNOSIS — E119 Type 2 diabetes mellitus without complications: Secondary | ICD-10-CM | POA: Diagnosis not present

## 2019-11-21 DIAGNOSIS — U071 COVID-19: Secondary | ICD-10-CM | POA: Diagnosis not present

## 2019-11-21 DIAGNOSIS — R6520 Severe sepsis without septic shock: Secondary | ICD-10-CM | POA: Diagnosis not present

## 2019-11-21 DIAGNOSIS — J181 Lobar pneumonia, unspecified organism: Secondary | ICD-10-CM | POA: Diagnosis not present

## 2019-11-21 DIAGNOSIS — A419 Sepsis, unspecified organism: Secondary | ICD-10-CM | POA: Diagnosis not present

## 2019-12-11 ENCOUNTER — Other Ambulatory Visit: Payer: Self-pay | Admitting: Interventional Radiology

## 2019-12-11 DIAGNOSIS — I82463 Acute embolism and thrombosis of calf muscular vein, bilateral: Secondary | ICD-10-CM

## 2019-12-22 DIAGNOSIS — A419 Sepsis, unspecified organism: Secondary | ICD-10-CM | POA: Diagnosis not present

## 2019-12-22 DIAGNOSIS — R6520 Severe sepsis without septic shock: Secondary | ICD-10-CM | POA: Diagnosis not present

## 2019-12-22 DIAGNOSIS — U071 COVID-19: Secondary | ICD-10-CM | POA: Diagnosis not present

## 2019-12-22 DIAGNOSIS — J181 Lobar pneumonia, unspecified organism: Secondary | ICD-10-CM | POA: Diagnosis not present

## 2020-01-07 ENCOUNTER — Ambulatory Visit
Admission: RE | Admit: 2020-01-07 | Discharge: 2020-01-07 | Disposition: A | Discharge status: Home / Self Care | Payer: PPO | Source: Ambulatory Visit | Attending: Interventional Radiology | Admitting: Interventional Radiology

## 2020-01-07 ENCOUNTER — Other Ambulatory Visit: Payer: Self-pay

## 2020-01-07 DIAGNOSIS — Z86718 Personal history of other venous thrombosis and embolism: Secondary | ICD-10-CM | POA: Diagnosis not present

## 2020-01-07 DIAGNOSIS — I82411 Acute embolism and thrombosis of right femoral vein: Secondary | ICD-10-CM | POA: Diagnosis not present

## 2020-01-07 DIAGNOSIS — I82431 Acute embolism and thrombosis of right popliteal vein: Secondary | ICD-10-CM | POA: Diagnosis not present

## 2020-01-07 DIAGNOSIS — I82463 Acute embolism and thrombosis of calf muscular vein, bilateral: Secondary | ICD-10-CM

## 2020-01-07 DIAGNOSIS — I82531 Chronic embolism and thrombosis of right popliteal vein: Secondary | ICD-10-CM | POA: Diagnosis not present

## 2020-01-07 HISTORY — PX: IR RADIOLOGIST EVAL & MGMT: IMG5224

## 2020-01-07 NOTE — Procedures (Signed)
Chief Complaint: Iliocaval and bilateral iliofemoral DVT, SP therapy  Referring Physician(s): Louk,Alexandra M  Hematology/Oncology: Dr. Lindi Adie  History of Present Illness: Regina Munoz is a 69 y.o. female presenting today for a scheduled follow up visit to [place] clinic, SP treatment for iliocaval and bilateral iliofemoral DVT.   Regina Munoz is here today by herself for the visit.    Repeat bilateral venous duplex was performed today.  This shows no acute DVT.  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   Hx: We first met Regina Munoz at [facility] hospital 04/28/2019 when she was referred to [place] for extra-peritoneal hemorrhage as a complication of anticoagulation.  She was started on heparin during hospitalization for COVID PNA, with PE and DVT.    Ultimately we did not treat her with any embolization, as she stabilized once [facility] was withdrawn.  She did require IVC filter placement.    She was DC'd but then came back via the ED 05/10/2019 with bilateral iliofemoral and iliocaval thrombus/DVT, up to the filter.   We were able to start her on [facility], treat her IVC, iliac, and femoral DVT with mechanical/aspiration thrombectomy via bilateral popliteal vein access on 05/16/2019.    Interval Hx: She continues now on DOAC, and denies any GI bleeding, hematemesis, nosebleeds, or other new symptoms.    She has since had COVID vaccination, with 2 doses.  She tells me she felt quite ill after each of the doses, and in fact she had an unfortunate event after the second dose at home where she fainted in a chair, and fell on the glass framing of a picture, receiving a laceration requiring stiches.  Other than this she has stayed out of the ED.  She has follow up with Dr. Lindi Adie at the end of the year to discuss the [place].    Regarding her leg symptoms, she tells me today that she has essentially no symptoms, with no pain,  paresthesia, pruritis, cramping, or heaviness.  She does not have any redness, swelling, asymmetry, though does have some varicose veins/venous ectasia. She tells me that she wore compression stockings after discharge, but does not wear them during the summer.  She acknowledges they are easier to wear in the fall/winter.      Past Medical History:  Diagnosis Date  . Anxiety   . Arthritis   . Asthma   . Complication of anesthesia   . COVID-19   . Diabetes mellitus without complication ([place])   . Dysrhythmia    palpitations ->20 yrs stress test neg nothing since  . GERD (gastroesophageal reflux disease)   . Headache   . PONV (postoperative nausea and vomiting)     Past Surgical History:  Procedure Laterality Date  . ABDOMINAL HYSTERECTOMY    . ANTERIOR CERVICAL DECOMP/DISCECTOMY FUSION N/A 02/17/2016   Procedure: ANTERIOR CERVICAL DECOMPRESSION/DISCECTOMY FUSION 1 LEVEL C4-5;  Surgeon: Melina Schools, MD;  Location: [place];  Service: Orthopedics;  Laterality: N/A;  . EYE SURGERY Bilateral    cataracts  . HIP CLOSED REDUCTION Right 12/24/2012   Procedure: CLOSED REDUCTION HIP;  Surgeon: Sinclair Ship, MD;  Location: [facility];  Service: Orthopedics;  Laterality: Right;  . IR IVC FILTER PLMT / S&I [place] Keels GUID/MOD SED  04/29/2019  . IR PTA VENOUS EXCEPT DIALYSIS CIRCUIT  05/16/2019  . IR RADIOLOGIST EVAL & MGMT  06/18/2019  . IR THROMBECT SEC MECH MOD SED  05/16/2019  .  IR THROMBECT VENO MECH MOD SED  05/16/2019  . IR US GUIDE VASC ACCESS LEFT  05/16/2019  . IR US GUIDE VASC ACCESS RIGHT  05/16/2019  . IR US GUIDE VASC ACCESS RIGHT  05/16/2019  . IR VENO/EXT/BI  05/16/2019  . IR VENOCAVAGRAM IVC  05/16/2019  . JOINT REPLACEMENT  2002   right and left knees  . SHOULDER ARTHROSCOPY Left   . TOTAL HIP ARTHROPLASTY Right    2002    Allergies: Patient has no known allergies.  Medications: Prior to Admission medications   Medication Sig Start Date End Date Taking? Authorizing  Provider  acetaminophen (TYLENOL) 500 MG tablet Take 1,000 mg by mouth every 6 (six) hours as needed for mild pain or moderate pain.     [provider]  albuterol (VENTOLIN HFA) 108 (90 Base) MCG/ACT inhaler Inhale 2 puffs into the lungs every 4 (four) hours as needed for wheezing or shortness of breath. 05/02/19   Regalado, Belkys A, MD  ascorbic acid (VITAMIN C) 500 MG tablet Take 1 tablet (500 mg total) by mouth daily. 05/02/19   Regalado, Belkys A, MD  blood glucose meter kit and supplies KIT Dispense based on patient and insurance preference. Use up to four times daily as directed. (FOR ICD-9 250.00, 250.01). 05/02/19   Regalado, Belkys A, MD  buPROPion (ZYBAN) 150 MG 12 hr tablet Take 75 mg by mouth every morning.  03/28/19   [provider]  cetirizine (ZYRTEC) 10 MG tablet Take 10 mg by mouth daily. 02/13/19   [provider]  cholecalciferol (VITAMIN D) 1000 UNITS tablet Take 1,000 Units by mouth daily.    [provider]  dextromethorphan-guaiFENesin (MUCINEX DM) 30-600 MG 12hr tablet Take 1 tablet by mouth 2 (two) times daily. 05/02/19   Regalado, Belkys A, MD  diclofenac Sodium (VOLTAREN) 1 % GEL Apply 2 g topically daily as needed (for pain).     [provider]  Insulin Glargine (LANTUS) 100 UNIT/ML Solostar Pen Inject 10 Units into the skin daily. 05/02/19   Regalado, Belkys A, MD  Insulin Pen Needle (PEN NEEDLES 3/16") 31G X 5 MM MISC 1 application by Does not apply route daily. 05/02/19   Regalado, Belkys A, MD  metFORMIN (GLUCOPHAGE) 500 MG tablet Take 500 mg by mouth 2 (two) times daily with a meal.     [provider]  omeprazole (PRILOSEC) 20 MG capsule Take 20 mg by mouth daily.    [provider]  polyethylene glycol (MIRALAX / GLYCOLAX) 17 g packet Take 17 g by mouth daily as needed for mild constipation. 05/02/19   Regalado, Belkys A, MD  rivaroxaban (XARELTO) 20 MG TABS tablet Take 1 tablet (20 mg total) by mouth  daily with supper. 06/11/19   Nicholas Lose, MD  tiotropium (SPIRIVA HANDIHALER) 18 MCG inhalation capsule Place 1 capsule (18 mcg total) into inhaler and inhale 2 (two) times daily. 05/02/19   Regalado, Belkys A, MD  umeclidinium-vilanterol (ANORO ELLIPTA) 62.5-25 MCG/INH AEPB Inhale 1 puff into the lungs daily. 06/11/19   Nicholas Lose, MD  zinc sulfate 220 (50 Zn) MG capsule Take 1 capsule (220 mg total) by mouth daily. 05/02/19   Regalado, Cassie Freer, MD     No family history on file.  Social History   Socioeconomic History  . Marital status: Married    Spouse name: Not on file  . Number of children: Not on file  . Years of education: Not on file  . Highest education  level: Not on file  Occupational History  . Not on file  Tobacco Use  . Smoking status: Never Smoker  . Smokeless tobacco: Never Used  Vaping Use  . Vaping Use: Never used  Substance and Sexual Activity  . Alcohol use: No  . Drug use: No  . Sexual activity: Not on file  Other Topics Concern  . Not on file  Social History Narrative  . Not on file   Social Determinants of Health   Financial Resource Strain: Low Risk   . Difficulty of Paying Living Expenses: Not hard at all  Food Insecurity: No Food Insecurity  . Worried About Charity fundraiser in the Last Year: Never true  . Ran Out of Food in the Last Year: Never true  Transportation Needs: No Transportation Needs  . Lack of Transportation (Medical): No  . Lack of Transportation (Non-Medical): No  Physical Activity: Unknown  . Days of Exercise per Week: 1 day  . Minutes of Exercise per Session: Not on file  Stress:   . Feeling of Stress : Not on file  Social Connections:   . Frequency of Communication with Friends and Family: Not on file  . Frequency of Social Gatherings with Friends and Family: Not on file  . Attends Religious Services: Not on file  . Active Member of Clubs or Organizations: Not on file  . Attends Archivist Meetings: Not  on file  . Marital Status: Not on file       Review of Systems: A 12 point ROS discussed and pertinent positives are indicated in the HPI above.  All other systems are negative.  Review of Systems  Vital Signs: There were no vitals taken for this visit.  Physical Exam General: 69 yo female appearing stated age.  Well-developed, well-nourished.  No distress. HEENT: Atraumatic, normocephalic.  Glasses.  Conjugate gaze, extra-ocular motor intact. No scleral icterus or scleral injection. No lesions on external ears, nose, lips, or gums.  Oral mucosa moist, pink.  Neck: Symmetric with no goiter enlargement.  Chest/Lungs:  Symmetric chest with inspiration/expiration.  No labored breathing.   Heart:   No JVD appreciated.  Abdomen:  Soft, NT/ND,  .   Genito-urinary: Deferred Neurologic: Alert & Oriented to person, place, and time.   Normal affect and insight.  Appropriate questions.  Moving all 4 extremities with gross sensory intact.   Extremities:  No wounds.  No pitting edema/swelling.  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   Mallampati Score:     Imaging: No results found.  Labs:  CBC: Recent Labs    05/21/19 0249 06/11/19 1536 09/10/19 1449 09/19/19 1858  WBC 9.3 6.4 7.7 7.4  HGB 7.9* 12.1 13.0 13.2  HCT 26.1* 40.6 42.1 42.6  PLT 194 239 147* 139*    COAGS: Recent Labs    04/27/19 2018 05/14/19 1200 05/16/19 0230  INR 1.4* 1.2 1.0  APTT  --  32  --     BMP: Recent Labs    05/18/19 0249 05/19/19 0418 05/20/19 0405 09/19/19 1858  NA 134* 137 138 133*  K 3.8 3.4* 4.1 4.3  CL 110 112* 111 100  CO2 16* 18* 19* 24  GLUCOSE 194* 127* 139* 121*  BUN 13 9 8 17   CALCIUM 8.0* 8.3* 8.3* 9.3  CREATININE 0.76 0.73 0.76 0.95  GFRNONAA >60 >60 >60 >60  GFRAA >60 >60 >60 >60    LIVER FUNCTION TESTS: Recent Labs  05/11/19 0316 05/12/19 0609 05/16/19 0230 05/19/19 0418  BILITOT 2.1* 2.2* 1.0 0.7  AST 23 17 24  27   ALT 13 13 16 12   ALKPHOS 77 76 87 67  PROT 7.9 8.0 6.4* 5.3*  ALBUMIN 3.9 3.7 2.6* 2.0*    TUMOR MARKERS: No results for input(s): AFPTM, CEA, CA199, CHROMGRNA in the last 8760 hours.  Assessment and Plan:  Regina Calbert is a 69 yo female with history of DVT/PE in late December 2020, with iliocaval thrombosis of IVC filter.   This was successfully treated with mechanical thrombectomy, with good results.    She now is successfully taking DOAC, and has had no problems.    I do think that retrieval of the IVC filter is indicated, given her success in using AC.    We discussed removal of the filter on outpatient basis.    Risks and benefits regarding IVC filter retrieval were discussed with her including, but not limited to bleeding, infection, contrast induced renal failure, filter fracture or migration which can lead to emergency surgery or even death, inability to retrieve filter, strut penetration with damage or irritation to adjacent structures.   All of the patient's questions were answered, patient is agreeable to proceed.   Plan: - Plan for IVC filter retrieval with Dr. Earleen Newport at [facility] in the next few weeks when able.  Do not need to hold anti-coagulation. - Continue compression stocking therapy    Electronically Signed: Corrie Mckusick 01/07/2020, 2:32 PM   I spent a total of    25 Minutes in face to face in clinical consultation, greater than 50% of which was counseling/coordinating care for DVT, IVC filter, possible IVC retrieval

## 2020-01-08 ENCOUNTER — Other Ambulatory Visit (HOSPITAL_COMMUNITY): Payer: Self-pay | Admitting: Interventional Radiology

## 2020-01-08 DIAGNOSIS — Z95828 Presence of other vascular implants and grafts: Secondary | ICD-10-CM

## 2020-01-17 ENCOUNTER — Other Ambulatory Visit: Payer: Self-pay | Admitting: Physician Assistant

## 2020-01-21 ENCOUNTER — Ambulatory Visit (HOSPITAL_COMMUNITY)
Admission: RE | Admit: 2020-01-21 | Discharge: 2020-01-21 | Disposition: A | Discharge status: Home / Self Care | Payer: PPO | Source: Ambulatory Visit | Attending: Interventional Radiology | Admitting: Interventional Radiology

## 2020-01-21 ENCOUNTER — Encounter (HOSPITAL_COMMUNITY): Payer: Self-pay

## 2020-01-21 ENCOUNTER — Other Ambulatory Visit: Payer: Self-pay

## 2020-01-21 DIAGNOSIS — F419 Anxiety disorder, unspecified: Secondary | ICD-10-CM | POA: Insufficient documentation

## 2020-01-21 DIAGNOSIS — Z86711 Personal history of pulmonary embolism: Secondary | ICD-10-CM | POA: Diagnosis not present

## 2020-01-21 DIAGNOSIS — Z8616 Personal history of COVID-19: Secondary | ICD-10-CM | POA: Diagnosis not present

## 2020-01-21 DIAGNOSIS — Z95828 Presence of other vascular implants and grafts: Secondary | ICD-10-CM

## 2020-01-21 DIAGNOSIS — I82413 Acute embolism and thrombosis of femoral vein, bilateral: Secondary | ICD-10-CM | POA: Diagnosis not present

## 2020-01-21 DIAGNOSIS — E119 Type 2 diabetes mellitus without complications: Secondary | ICD-10-CM | POA: Insufficient documentation

## 2020-01-21 DIAGNOSIS — K219 Gastro-esophageal reflux disease without esophagitis: Secondary | ICD-10-CM | POA: Diagnosis not present

## 2020-01-21 DIAGNOSIS — Z794 Long term (current) use of insulin: Secondary | ICD-10-CM | POA: Insufficient documentation

## 2020-01-21 DIAGNOSIS — I82433 Acute embolism and thrombosis of popliteal vein, bilateral: Secondary | ICD-10-CM | POA: Diagnosis not present

## 2020-01-21 DIAGNOSIS — Z452 Encounter for adjustment and management of vascular access device: Secondary | ICD-10-CM | POA: Insufficient documentation

## 2020-01-21 DIAGNOSIS — J45909 Unspecified asthma, uncomplicated: Secondary | ICD-10-CM | POA: Insufficient documentation

## 2020-01-21 DIAGNOSIS — Z79899 Other long term (current) drug therapy: Secondary | ICD-10-CM | POA: Diagnosis not present

## 2020-01-21 DIAGNOSIS — Z7901 Long term (current) use of anticoagulants: Secondary | ICD-10-CM | POA: Diagnosis not present

## 2020-01-21 DIAGNOSIS — Z86718 Personal history of other venous thrombosis and embolism: Secondary | ICD-10-CM | POA: Diagnosis not present

## 2020-01-21 DIAGNOSIS — I82423 Acute embolism and thrombosis of iliac vein, bilateral: Secondary | ICD-10-CM | POA: Diagnosis not present

## 2020-01-21 DIAGNOSIS — I2699 Other pulmonary embolism without acute cor pulmonale: Secondary | ICD-10-CM | POA: Diagnosis not present

## 2020-01-21 HISTORY — PX: IR IVC FILTER RETRIEVAL / S&I /IMG GUID/MOD SED: IMG5308

## 2020-01-21 LAB — BASIC METABOLIC PANEL
Anion gap: 8 (ref 5–15)
BUN: 18 mg/dL (ref 8–23)
CO2: 25 mmol/L (ref 22–32)
Calcium: 9.3 mg/dL (ref 8.9–10.3)
Chloride: 105 mmol/L (ref 98–111)
Creatinine, Ser: 0.84 mg/dL (ref 0.44–1.00)
GFR calc Af Amer: >60 mL/min (ref >60)
GFR calc non Af Amer: >60 mL/min (ref >60)
Glucose, Bld: 101 mg/dL — ABNORMAL HIGH (ref 70–99)
Potassium: 5.4 mmol/L — ABNORMAL HIGH (ref 3.5–5.1)
Sodium: 138 mmol/L (ref 135–145)

## 2020-01-21 LAB — GLUCOSE, CAPILLARY: Glucose-Capillary: 111 mg/dL — ABNORMAL HIGH (ref 70–99)

## 2020-01-21 MED ORDER — LIDOCAINE HCL 1 % IJ SOLN
INTRAMUSCULAR | Status: AC
  Filled 2020-01-21: qty 20

## 2020-01-21 MED ORDER — LIDOCAINE HCL 1 % IJ SOLN
INTRAMUSCULAR | Status: AC | PRN
  Administered 2020-01-21: 10 mL
  Administered 2020-01-21: 20 mL

## 2020-01-21 MED ORDER — FENTANYL CITRATE (PF) 100 MCG/2ML IJ SOLN
INTRAMUSCULAR | Status: AC
  Filled 2020-01-21: qty 2

## 2020-01-21 MED ORDER — MIDAZOLAM HCL 2 MG/2ML IJ SOLN
INTRAMUSCULAR | Status: AC
  Filled 2020-01-21: qty 2

## 2020-01-21 MED ORDER — FENTANYL CITRATE (PF) 100 MCG/2ML IJ SOLN
INTRAMUSCULAR | Status: AC | PRN
  Administered 2020-01-21 (×2): 25 ug via INTRAVENOUS

## 2020-01-21 MED ORDER — IOHEXOL 300 MG/ML  SOLN
100.0000 mL | Freq: Once | INTRAMUSCULAR | Status: AC | PRN
  Administered 2020-01-21: 40 mL via INTRAVENOUS

## 2020-01-21 MED ORDER — SODIUM CHLORIDE 0.9 % IV SOLN: INTRAVENOUS | Status: DC

## 2020-01-21 MED ORDER — MIDAZOLAM HCL 2 MG/2ML IJ SOLN
INTRAMUSCULAR | Status: AC | PRN
  Administered 2020-01-21 (×2): 0.5 mg via INTRAVENOUS

## 2020-01-21 NOTE — Discharge Instructions (Signed)
IVC filter removal///Venogram A venogram, or venography, is a procedure that uses an X-ray and dye (contrast) to examine how well the veins work and how blood flows through them. Contrast helps the veins show up on X-rays. A venogram may be done:  To evaluate abnormalities in the vein.  To identify clots within veins, such as deep vein thrombosis (DVT).  To map out the veins that might be needed for another procedure. Tell a health care provider about:  Any allergies you have, especially to medicines, shellfish, iodine, and contrast.  All medicines you are taking, including vitamins, herbs, eye drops, creams, and over-the-counter medicines.  Any problems you or family members have had with anesthetic medicines.  Any blood disorders you have.  Any surgeries you have had and any complications that occurred.  Any medical conditions you have.  Whether you are pregnant, may be pregnant, or are breastfeeding.  Any history of smoking or tobacco use. What are the risks? Generally, this is a safe procedure. However, problems may occur, including:  Infection.  Bleeding.  Blood clots.  Allergic reaction to medicines or contrast.  Damage to other structures or organs.  Kidney problems.  Increased risk of cancer. Being exposed to too much radiation over a lifetime can increase the risk of cancer. The risk is small. What happens before the procedure? Medicines Ask your health care provider about:  Changing or stopping your regular medicines. This is especially important if you are taking diabetes medicines or blood thinners.  Taking medicines such as aspirin and ibuprofen. These medicines can thin your blood. Do not take these medicines unless your health care provider tells you to take them.  Taking over-the-counter medicines, vitamins, herbs, and supplements. General instructions  Follow instructions from your health care provider about eating or drinking restrictions.  You  may have blood tests to check how well your kidneys and liver are working and how well your blood can clot.  Plan to have someone take you home from the hospital or clinic. What happens during the procedure?   An IV will be inserted into one of your veins.  You may be given a medicine to help you relax (sedative).  You will lie down on an X-ray table. The table may be tilted in different directions during the procedure to help the contrast move throughout your body. Safety straps will keep you secure if the table is tilted.  If veins in your arm or leg will be examined, a band may be wrapped around that arm or leg to keep the veins full of blood. This may cause your arm or leg to feel numb.  The contrast will be injected into your IV. You may have a hot, flushed feeling as it moves throughout your body. You may also have a metallic taste in your mouth. Both of these sensations will go away after the test is complete.  You may be asked to lie in different positions or place your legs or arms in different positions.  At the end of the procedure, you may be given IV fluids to help wash or flush the contrast out of your veins.  The IV will be removed, and pressure will be applied to the IV site to prevent bleeding. A bandage (dressing) may be applied to the IV site. The exact procedure may vary among health care providers and hospitals. What can I expect after the procedure?  Your blood pressure, heart rate, breathing rate, and blood oxygen level will be monitored  until you leave the hospital or clinic.  You may be given something to eat and drink.  You may have bruising or mild discomfort in the area where the IV was inserted. Follow these instructions at home: Eating and drinking   Follow instructions from your health care provider about eating or drinking restrictions.  Drink a lot of water for the first several days after the procedure, as directed by your health care provider.  This helps to flush the contrast out of your body. Activity  Rest as told by your health care provider.  Return to your normal activities as told by your health care provider. Ask your health care provider what activities are safe for you.  If you were given a sedative during your procedure, do not drive for 24 hours or until your health care provider approves. General instructions  Check your IV insertion area every day for signs of infection. Check for: ? Redness, swelling, or pain. ? Fluid or blood. ? Warmth. ? Pus or a bad smell.  Take over-the-counter and prescription medicines only as told by your health care provider.  Keep all follow-up visits as told by your health care provider. This is important. Contact a health care provider if:  Your skin becomes itchy or you develop a rash or hives.  You have a fever that does not get better with medicine.  You feel nauseous or you vomit.  You have redness, swelling, or pain around the insertion site.  You have fluid or blood coming from the insertion site.  Your insertion area feels warm to the touch.  You have pus or a bad smell coming from the insertion site. Get help right away if you:  Have shortness of breath or difficulty breathing.  Develop chest pain.  Faint.  Feel very dizzy. These symptoms may represent a serious problem that is an emergency. Do not wait to see if the symptoms will go away. Get medical help right away. Call your local emergency services (911 in the [place] Do not drive yourself to the hospital. Summary  A venogram, or venography, is a procedure that uses an X-ray and contrast dye to check how well the veins work and how blood flows through them.  An IV will be inserted into one of your veins in order to inject the contrast.  During the exam, you will lie on an X-ray table. The table may be tilted in different directions during the procedure to help the contrast move throughout your body.  Safety straps will keep you secure.  After the procedure, you will need to drink a lot of water to help wash or flush the contrast out of your body. This information is not intended to replace advice given to you by your health care provider. Make sure you discuss any questions you have with your health care provider. Document Revised: 12/08/2018 Document Reviewed: 12/08/2018 Elsevier Patient Education  2020 [employer].

## 2020-01-21 NOTE — H&P (Signed)
Chief Complaint: Patient was seen in consultation today for IVC filter retrieval  Referring Physician(s): [place]  Supervising Physician: Corrie Mckusick  Patient Status: [facility] - Out-pt  History of Present Illness: Regina Munoz is a 69 y.o. female with past medical history of extra-peritoneal hemorrhage 04/28/2019 after starting anticoagulation for PE, DVT in the setting of COVID infection 04/15/19.  After filter was placed, she returned tot he ED 05/10/19 with significant clot noted in bilateral iliofemoral, iliocaval thrombus/DVT up to the level of the filter.  She underwent femoral DVT thrombectomy via bilateral popliteal veins 05/15/20 which reduced clot burden. She was then able to successfully transition to anticoagulation which she has maintained since this time.  She recently met with Dr. Earleen Newport in consultation to discuss IVC filter retrieval.    Patient assessed in short stay. States that she continues to feel winded at times after her prolonged hospitalization due to COVID and PE/DVT. Her leg swelling has completely resolved and she has not difficult ambulating. She reports recent consultation with Dr. Earleen Newport.  She is agreeable to procedure today.   She has been NPO.  She does take Xarelto and last took today.    Past Medical History:  Diagnosis Date  . Anxiety   . Arthritis   . Asthma   . Complication of anesthesia   . COVID-19   . Diabetes mellitus without complication ([place])   . Dysrhythmia    palpitations ->20 yrs stress test neg nothing since  . GERD (gastroesophageal reflux disease)   . Headache   . PONV (postoperative nausea and vomiting)     Past Surgical History:  Procedure Laterality Date  . ABDOMINAL HYSTERECTOMY    . ANTERIOR CERVICAL DECOMP/DISCECTOMY FUSION N/A 02/17/2016   Procedure: ANTERIOR CERVICAL DECOMPRESSION/DISCECTOMY FUSION 1 LEVEL C4-5;  Surgeon: Melina Schools, MD;  Location: [place];  Service: Orthopedics;  Laterality: N/A;  . EYE  SURGERY Bilateral    cataracts  . HIP CLOSED REDUCTION Right 12/24/2012   Procedure: CLOSED REDUCTION HIP;  Surgeon: Sinclair Ship, MD;  Location: [facility];  Service: Orthopedics;  Laterality: Right;  . IR IVC FILTER PLMT / S&I Burke Keels GUID/MOD SED  04/29/2019  . IR PTA VENOUS EXCEPT DIALYSIS CIRCUIT  05/16/2019  . IR RADIOLOGIST EVAL & MGMT  06/18/2019  . IR RADIOLOGIST EVAL & MGMT  01/07/2020  . IR THROMBECT SEC MECH MOD SED  05/16/2019  . IR THROMBECT VENO MECH MOD SED  05/16/2019  . IR US GUIDE VASC ACCESS LEFT  05/16/2019  . IR US GUIDE VASC ACCESS RIGHT  05/16/2019  . IR US GUIDE VASC ACCESS RIGHT  05/16/2019  . IR VENO/EXT/BI  05/16/2019  . IR VENOCAVAGRAM IVC  05/16/2019  . JOINT REPLACEMENT  2002   right and left knees  . SHOULDER ARTHROSCOPY Left   . TOTAL HIP ARTHROPLASTY Right    2002    Allergies: Patient has no known allergies.  Medications: Prior to Admission medications   Medication Sig Start Date End Date Taking? Authorizing Provider  acetaminophen (TYLENOL) 500 MG tablet Take 1,000 mg by mouth every 6 (six) hours as needed for mild pain or moderate pain.    Yes [provider]  albuterol (VENTOLIN HFA) 108 (90 Base) MCG/ACT inhaler Inhale 2 puffs into the lungs every 4 (four) hours as needed for wheezing or shortness of breath. Patient taking differently: Inhale 1-2 puffs into the lungs every 4 (four) hours as needed for wheezing or shortness of breath.  05/02/19  Yes Regalado, Belkys A, MD  blood glucose meter kit and supplies KIT Dispense based on patient and insurance preference. Use up to four times daily as directed. (FOR ICD-9 250.00, 250.01). 05/02/19  Yes Regalado, Belkys A, MD  buPROPion (WELLBUTRIN SR) 150 MG 12 hr tablet Take 150 mg by mouth daily.   Yes [provider]  cetirizine (ZYRTEC) 10 MG tablet Take 10 mg by mouth daily. 02/13/19  Yes [provider]  cholecalciferol (VITAMIN D) 1000 UNITS tablet Take 1,000 Units by  mouth daily.   Yes [provider]  diclofenac Sodium (VOLTAREN) 1 % GEL Apply 2 g topically daily as needed (for pain).    Yes [provider]  HYDROcodone-acetaminophen (NORCO) 10-325 MG tablet Take 1 tablet by mouth daily as needed for moderate pain.  10/17/19  Yes [provider]  Insulin Pen Needle (PEN NEEDLES 3/16") 31G X 5 MM MISC 1 application by Does not apply route daily. 05/02/19  Yes Regalado, Belkys A, MD  metFORMIN (GLUCOPHAGE) 500 MG tablet Take 500 mg by mouth 2 (two) times daily with a meal.    Yes [provider]  omeprazole (PRILOSEC) 20 MG capsule Take 20 mg by mouth daily.   Yes [provider]  rivaroxaban (XARELTO) 20 MG TABS tablet Take 1 tablet (20 mg total) by mouth daily with supper. 06/11/19  Yes Nicholas Lose, MD  Insulin Glargine (LANTUS) 100 UNIT/ML Solostar Pen Inject 10 Units into the skin daily. Patient not taking: Reported on 01/14/2020 05/02/19   Regalado, Jerald Kief A, MD  tiotropium (SPIRIVA HANDIHALER) 18 MCG inhalation capsule Place 1 capsule (18 mcg total) into inhaler and inhale 2 (two) times daily. Patient not taking: Reported on 01/14/2020 05/02/19   Regalado, Jerald Kief A, MD  umeclidinium-vilanterol (ANORO ELLIPTA) 62.5-25 MCG/INH AEPB Inhale 1 puff into the lungs daily. Patient not taking: Reported on 01/14/2020 06/11/19   Nicholas Lose, MD     History reviewed. No pertinent family history.  Social History   Socioeconomic History  . Marital status: Married    Spouse name: Not on file  . Number of children: Not on file  . Years of education: Not on file  . Highest education level: Not on file  Occupational History  . Not on file  Tobacco Use  . Smoking status: Never Smoker  . Smokeless tobacco: Never Used  Vaping Use  . Vaping Use: Never used  Substance and Sexual Activity  . Alcohol use: No  . Drug use: No  . Sexual activity: Not on file  Other Topics Concern  . Not on file  Social History Narrative   . Not on file   Social Determinants of Health   Financial Resource Strain: Low Risk   . Difficulty of Paying Living Expenses: Not hard at all  Food Insecurity: No Food Insecurity  . Worried About Charity fundraiser in the Last Year: Never true  . Ran Out of Food in the Last Year: Never true  Transportation Needs: No Transportation Needs  . Lack of Transportation (Medical): No  . Lack of Transportation (Non-Medical): No  Physical Activity: Unknown  . Days of Exercise per Week: 1 day  . Minutes of Exercise per Session: Not on file  Stress:   . Feeling of Stress : Not on file  Social Connections:   . Frequency of Communication with Friends and Family: Not on file  . Frequency of Social Gatherings with Friends and Family: Not on file  . Attends Religious  Services: Not on file  . Active Member of Clubs or Organizations: Not on file  . Attends Archivist Meetings: Not on file  . Marital Status: Not on file     Review of Systems: A 12 point ROS discussed and pertinent positives are indicated in the HPI above.  All other systems are negative.  Review of Systems  Constitutional: Negative for fatigue and fever.  Respiratory: Positive for shortness of breath (occasional, chronic since COVID infection). Negative for cough.   Cardiovascular: Negative for chest pain.  Gastrointestinal: Negative for abdominal pain, nausea and vomiting.  Musculoskeletal: Negative for back pain.  Psychiatric/Behavioral: Negative for behavioral problems and confusion.    Vital Signs: BP 117/60 (BP Location: Right Arm)   Pulse 79   Temp 98.5 F (36.9 C) (Oral)   Resp 14   Ht _0  (1.6 m)   Wt 215 lb (97.5 kg)   SpO2 99%   BMI 38.09 kg/m   Physical Exam Vitals and nursing note reviewed.  Constitutional:      General: She is not in acute distress.    Appearance: Normal appearance. She is not ill-appearing.  HENT:     Mouth/Throat:     Mouth: Mucous membranes are moist.     Pharynx:  Oropharynx is clear.  Cardiovascular:     Rate and Rhythm: Normal rate and regular rhythm.  Pulmonary:     Effort: Pulmonary effort is normal. No respiratory distress.     Breath sounds: Normal breath sounds.  Abdominal:     General: Abdomen is flat.     Palpations: Abdomen is soft.  Skin:    General: Skin is warm and dry.  Neurological:     General: No focal deficit present.     Mental Status: She is alert and oriented to person, place, and time. Mental status is at baseline.  Psychiatric:        Mood and Affect: Mood normal.        Behavior: Behavior normal.        Thought Content: Thought content normal.        Judgment: Judgment normal.      MD Evaluation Airway: WNL Heart: WNL Abdomen: WNL Chest/ Lungs: WNL ASA  Classification: 3 Mallampati/Airway Score: One   Imaging: US Venous Img Lower Bilateral (DVT)  Result Date: 01/07/2020 CLINICAL DATA:  69 year old female with a history of lower extremity DVT/ilio caval thrombus, prior treatment 05/15/2020 EXAM: BILATERAL LOWER EXTREMITY VENOUS DOPPLER ULTRASOUND TECHNIQUE: Gray-scale sonography with graded compression, as well as color Doppler and duplex ultrasound were performed to evaluate the lower extremity deep venous systems from the level of the common femoral vein and including the common femoral, femoral, profunda femoral, popliteal and calf veins including the posterior tibial, peroneal and gastrocnemius veins when visible. The superficial great saphenous vein was also interrogated. Spectral Doppler was utilized to evaluate flow at rest and with distal augmentation maneuvers in the common femoral, femoral and popliteal veins. COMPARISON:  06/07/2019 FINDINGS: RIGHT LOWER EXTREMITY Common Femoral Vein: No evidence of thrombus. Normal compressibility, respiratory phasicity and response to augmentation. Saphenofemoral Junction: No evidence of thrombus. Normal compressibility and flow on color Doppler imaging. Profunda Femoral  Vein: No evidence of thrombus. Normal compressibility and flow on color Doppler imaging. Femoral Vein: Proximal right femoral vein compressible with flow maintained. Distal femoral vein remains essentially occluded, extending into the popliteal vein. Duplicated femoral vein with collateral flow. Popliteal Vein: Minimal flow within the popliteal vein which is  incompletely compressible. Calf Veins: No evidence of thrombus. Normal compressibility and flow on color Doppler imaging. Superficial Great Saphenous Vein: No evidence of thrombus. Normal compressibility and flow on color Doppler imaging. Other Findings:  None. LEFT LOWER EXTREMITY Common Femoral Vein: No evidence of thrombus. Normal compressibility, respiratory phasicity and response to augmentation. Saphenofemoral Junction: No evidence of thrombus. Normal compressibility and flow on color Doppler imaging. Profunda Femoral Vein: No evidence of thrombus. Normal compressibility and flow on color Doppler imaging. Femoral Vein: No evidence of thrombus. Normal compressibility, respiratory phasicity and response to augmentation. Popliteal Vein: No evidence of thrombus. Normal compressibility, respiratory phasicity and response to augmentation. Calf Veins: No evidence of thrombus. Normal compressibility and flow on color Doppler imaging. Superficial Great Saphenous Vein: GSV is compressible on today's study. Other Findings:  None. IMPRESSION: Sonographic survey of the bilateral lower extremities negative for DVT. Chronic changes of the right include includes essentially occluded popliteal vein and distal femoral vein with collateral flow. Electronically Signed   By: Corrie Mckusick D.O.   On: 01/07/2020 17:26   IR Radiologist Eval & Mgmt  Result Date: 01/07/2020 Please refer to notes tab for details about interventional procedure. (Op Note)   Labs:  CBC: Recent Labs    05/21/19 0249 06/11/19 1536 09/10/19 1449 09/19/19 1858  WBC 9.3 6.4 7.7 7.4  HGB  7.9* 12.1 13.0 13.2  HCT 26.1* 40.6 42.1 42.6  PLT 194 239 147* 139*    COAGS: Recent Labs    04/27/19 2018 05/14/19 1200 05/16/19 0230  INR 1.4* 1.2 1.0  APTT  --  32  --     BMP: Recent Labs    05/19/19 0418 05/20/19 0405 09/19/19 1858 01/21/20 1036  NA 137 138 133* 138  K 3.4* 4.1 4.3 5.4*  CL 112* 111 100 105  CO2 18* 19* 24 25  GLUCOSE 127* 139* 121* 101*  BUN _0 CALCIUM 8.3* 8.3* 9.3 9.3  CREATININE 0.73 0.76 0.95 0.84  GFRNONAA >60 >60 >60 >60  GFRAA >60 >60 >60 >60    LIVER FUNCTION TESTS: Recent Labs    05/11/19 0316 05/12/19 0609 05/16/19 0230 05/19/19 0418  BILITOT 2.1* 2.2* 1.0 0.7  AST _1 ALT _2 ALKPHOS 77 76 87 67  PROT 7.9 8.0 6.4* 5.3*  ALBUMIN 3.9 3.7 2.6* 2.0*    TUMOR MARKERS: No results for input(s): AFPTM, CEA, CA199, CHROMGRNA in the last 8760 hours.  Assessment and Plan: Patient with past medical history of COVID infection with subsequent development of bilateral DVT and acute PE.  She developed hemorrhagic complication in the setting of anticoagulation and underwent IVC filter placement.  She developed significant clot burden up to the IVC filter requiring thrombectomy.  Ultimately, she has been able resume anticoagulation with tolerance and without further bleeding.  She presents today for IVC removal.  She has met with Dr. Earleen Newport in consultation.   Patient presents today in their usual state of health.  She has been NPO and is currently on blood thinners as expected.  Risks and benefits discussed with the patient including, but not limited to bleeding, infection, contrast induced renal failure, filter fracture or migration which can lead to emergency surgery or even death, strut penetration with damage or irritation to adjacent structures and caval thrombosis.  All of the patient's questions were answered, patient is agreeable to proceed. Consent signed and in chart.  Thank you for this interesting  consult.  I greatly enjoyed meeting CASSIDEE DEATS and look forward to participating in their care.  A copy of this report was sent to the requesting provider on this date.  Electronically Signed: Docia Barrier, PA 01/21/2020, 12:21 PM   I spent a total of 40 Minutes    in face to face in clinical consultation, greater than 50% of which was counseling/coordinating care for DVT, pulmonary embolus, presence of IVC filter.

## 2020-01-21 NOTE — Procedures (Signed)
Interventional Radiology Procedure Note  Procedure: Retrievable IVC filter removal, via right IJ access.    Findings: Filter removed in its entirety.   Complications: None  Recommendations:  - OK to shower tomorrow - routine wound care - continue AC - hold metformin x 48 hrs - Do not submerge for 7 days    Signed,  Yvone Neu. Loreta Ave, DO

## 2020-01-22 DIAGNOSIS — U071 COVID-19: Secondary | ICD-10-CM | POA: Diagnosis not present

## 2020-01-22 DIAGNOSIS — R6520 Severe sepsis without septic shock: Secondary | ICD-10-CM | POA: Diagnosis not present

## 2020-01-22 DIAGNOSIS — A419 Sepsis, unspecified organism: Secondary | ICD-10-CM | POA: Diagnosis not present

## 2020-01-22 DIAGNOSIS — J181 Lobar pneumonia, unspecified organism: Secondary | ICD-10-CM | POA: Diagnosis not present

## 2020-01-29 DIAGNOSIS — E119 Type 2 diabetes mellitus without complications: Secondary | ICD-10-CM | POA: Diagnosis not present

## 2020-01-29 DIAGNOSIS — I10 Essential (primary) hypertension: Secondary | ICD-10-CM | POA: Diagnosis not present

## 2020-01-29 DIAGNOSIS — E782 Mixed hyperlipidemia: Secondary | ICD-10-CM | POA: Diagnosis not present

## 2020-02-21 DIAGNOSIS — U071 COVID-19: Secondary | ICD-10-CM | POA: Diagnosis not present

## 2020-02-21 DIAGNOSIS — J181 Lobar pneumonia, unspecified organism: Secondary | ICD-10-CM | POA: Diagnosis not present

## 2020-02-21 DIAGNOSIS — R6520 Severe sepsis without septic shock: Secondary | ICD-10-CM | POA: Diagnosis not present

## 2020-02-21 DIAGNOSIS — A419 Sepsis, unspecified organism: Secondary | ICD-10-CM | POA: Diagnosis not present

## 2020-03-23 DIAGNOSIS — J181 Lobar pneumonia, unspecified organism: Secondary | ICD-10-CM | POA: Diagnosis not present

## 2020-03-23 DIAGNOSIS — A419 Sepsis, unspecified organism: Secondary | ICD-10-CM | POA: Diagnosis not present

## 2020-03-23 DIAGNOSIS — R6520 Severe sepsis without septic shock: Secondary | ICD-10-CM | POA: Diagnosis not present

## 2020-03-23 DIAGNOSIS — U071 COVID-19: Secondary | ICD-10-CM | POA: Diagnosis not present

## 2020-03-30 DIAGNOSIS — H04123 Dry eye syndrome of bilateral lacrimal glands: Secondary | ICD-10-CM | POA: Diagnosis not present

## 2020-03-30 DIAGNOSIS — H40033 Anatomical narrow angle, bilateral: Secondary | ICD-10-CM | POA: Diagnosis not present

## 2020-04-22 DIAGNOSIS — U071 COVID-19: Secondary | ICD-10-CM | POA: Diagnosis not present

## 2020-04-22 DIAGNOSIS — J181 Lobar pneumonia, unspecified organism: Secondary | ICD-10-CM | POA: Diagnosis not present

## 2020-04-22 DIAGNOSIS — R6520 Severe sepsis without septic shock: Secondary | ICD-10-CM | POA: Diagnosis not present

## 2020-04-22 DIAGNOSIS — A419 Sepsis, unspecified organism: Secondary | ICD-10-CM | POA: Diagnosis not present

## 2020-04-29 DIAGNOSIS — E782 Mixed hyperlipidemia: Secondary | ICD-10-CM | POA: Diagnosis not present

## 2020-04-29 DIAGNOSIS — E119 Type 2 diabetes mellitus without complications: Secondary | ICD-10-CM | POA: Diagnosis not present

## 2020-04-29 DIAGNOSIS — I10 Essential (primary) hypertension: Secondary | ICD-10-CM | POA: Diagnosis not present

## 2020-04-30 DIAGNOSIS — Z23 Encounter for immunization: Secondary | ICD-10-CM | POA: Diagnosis not present

## 2020-05-01 ENCOUNTER — Other Ambulatory Visit: Payer: Self-pay

## 2020-05-01 DIAGNOSIS — I2699 Other pulmonary embolism without acute cor pulmonale: Secondary | ICD-10-CM

## 2020-05-03 NOTE — Progress Notes (Signed)
Patient Care Team: Adaline Sill, NP as PCP - General (Internal Medicine)  DIAGNOSIS:    ICD-10-CM   1. Bilateral pulmonary embolism ([place])  I26.99      CHIEF COMPLIANT: Follow-up of DVT, PE  INTERVAL HISTORY: Regina Munoz is a 69 y.o. with above-mentioned history of extensive,progressive bilateral lower extremity DVTand PE while hospitalized for a COVID-19 infection. She is currently on anticoagulation with Xarelto. She presents to the clinic today for follow-up.    She received the COVID-19 booster shot and developed lots of muscle aches and pains and bone discomfort especially in the lower back.  ALLERGIES:  has No Known Allergies.  MEDICATIONS:  Current Outpatient Medications  Medication Sig Dispense Refill  . acetaminophen (TYLENOL) 500 MG tablet Take 1,000 mg by mouth every 6 (six) hours as needed for mild pain or moderate pain.     Marland Kitchen albuterol (VENTOLIN HFA) 108 (90 Base) MCG/ACT inhaler Inhale 2 puffs into the lungs every 4 (four) hours as needed for wheezing or shortness of breath. (Patient taking differently: Inhale 1-2 puffs into the lungs every 4 (four) hours as needed for wheezing or shortness of breath. ) 8 g 0  . blood glucose meter kit and supplies KIT Dispense based on patient and insurance preference. Use up to four times daily as directed. (FOR ICD-9 250.00, 250.01). 1 each 0  . buPROPion (WELLBUTRIN SR) 150 MG 12 hr tablet Take 150 mg by mouth daily.    . cetirizine (ZYRTEC) 10 MG tablet Take 10 mg by mouth daily.    . cholecalciferol (VITAMIN D) 1000 UNITS tablet Take 1,000 Units by mouth daily.    . diclofenac Sodium (VOLTAREN) 1 % GEL Apply 2 g topically daily as needed (for pain).     Marland Kitchen HYDROcodone-acetaminophen (NORCO) 10-325 MG tablet Take 1 tablet by mouth daily as needed for moderate pain.     . Insulin Glargine (LANTUS) 100 UNIT/ML Solostar Pen Inject 10 Units into the skin daily. (Patient not taking: Reported on 01/14/2020) 15 mL 11  . Insulin Pen  Needle (PEN NEEDLES 3/16") 31G X 5 MM MISC 1 application by Does not apply route daily. 30 each 10  . metFORMIN (GLUCOPHAGE) 500 MG tablet Take 500 mg by mouth 2 (two) times daily with a meal.     . omeprazole (PRILOSEC) 20 MG capsule Take 20 mg by mouth daily.    . rivaroxaban (XARELTO) 20 MG TABS tablet Take 1 tablet (20 mg total) by mouth daily with supper. 90 tablet 3  . tiotropium (SPIRIVA HANDIHALER) 18 MCG inhalation capsule Place 1 capsule (18 mcg total) into inhaler and inhale 2 (two) times daily. (Patient not taking: Reported on 01/14/2020) 30 capsule 12  . umeclidinium-vilanterol (ANORO ELLIPTA) 62.5-25 MCG/INH AEPB Inhale 1 puff into the lungs daily. (Patient not taking: Reported on 01/14/2020)     No current facility-administered medications for this visit.    PHYSICAL EXAMINATION: ECOG PERFORMANCE STATUS: 1 - Symptomatic but completely ambulatory  Vitals:   05/04/20 1456  BP: 138/67  Pulse: 94  Resp: 18  Temp: 98.6 F (37 C)  SpO2: 99%   Filed Weights   05/04/20 1456  Weight: 219 lb 1.6 oz (99.4 kg)     LABORATORY DATA:  I have reviewed the data as listed CMP Latest Ref Rng & Units 01/21/2020 09/19/2019 05/20/2019  Glucose 70 - 99 mg/dL 101(H) 121(H) 139(H)  BUN 8 - 23 mg/dL 18 17 8   Creatinine 0.44 - 1.00 mg/dL 0.84  0.95 0.76  Sodium 135 - 145 mmol/L 138 133(L) 138  Potassium 3.5 - 5.1 mmol/L 5.4(H) 4.3 4.1  Chloride 98 - 111 mmol/L 105 100 111  CO2 22 - 32 mmol/L 25 24 19(L)  Calcium 8.9 - 10.3 mg/dL 9.3 9.3 8.3(L)  Total Protein 6.5 - 8.1 g/dL - - -  Total Bilirubin 0.3 - 1.2 mg/dL - - -  Alkaline Phos 38 - 126 U/L - - -  AST 15 - 41 U/L - - -  ALT 0 - 44 U/L - - -    Lab Results  Component Value Date   WBC 7.1 05/04/2020   HGB 13.5 05/04/2020   HCT 41.7 05/04/2020   MCV 81.6 05/04/2020   PLT 181 05/04/2020   NEUTROABS 3.3 05/04/2020    ASSESSMENT & PLAN:  Bilateral pulmonary embolism (H[facility]) Bilateral PE with right-sided heart strain and extensive  bilateral lower extremity DVTs with extension into the iliac and IVC. IVC filter placed Mechanical thrombectomy Retroperitoneal hematoma led to severe anemia requiring blood transfusion Lab review:  06/11/2019: Hemoglobin 12.1 06/12/2019: Hemoglobin 13  Current treatment: Anticoagulation with Xarelto for 1 year (started 05/05/2019) Xarelto toxicities: None Etiology: Covid pneumonia related thromboembolic disease.  She can come off anticoagulation at this time. We will see her on an as-needed basis.     No orders of the defined types were placed in this encounter.  The patient has a good understanding of the overall plan. she agrees with it. she will call with any problems that may develop before the next visit here.  Total time spent: 20 mins including face to face time and time spent for planning, charting and coordination of care  Brunetta Genera, MD 05/04/2020  I, Cloyde Reams Dorshimer, am acting as scribe for Dr. Nicholas Lose.  I have reviewed the above documentation for accuracy and completeness, and I agree with the above.

## 2020-05-04 ENCOUNTER — Other Ambulatory Visit: Payer: Self-pay

## 2020-05-04 ENCOUNTER — Inpatient Hospital Stay (HOSPITAL_BASED_OUTPATIENT_CLINIC_OR_DEPARTMENT_OTHER): Payer: PPO | Admitting: Hematology and Oncology

## 2020-05-04 ENCOUNTER — Other Ambulatory Visit: Payer: PPO

## 2020-05-04 ENCOUNTER — Ambulatory Visit: Payer: PPO | Admitting: Hematology

## 2020-05-04 ENCOUNTER — Inpatient Hospital Stay: Payer: PPO | Attending: Hematology and Oncology

## 2020-05-04 DIAGNOSIS — Z7901 Long term (current) use of anticoagulants: Secondary | ICD-10-CM | POA: Diagnosis not present

## 2020-05-04 DIAGNOSIS — D649 Anemia, unspecified: Secondary | ICD-10-CM | POA: Insufficient documentation

## 2020-05-04 DIAGNOSIS — Z79899 Other long term (current) drug therapy: Secondary | ICD-10-CM | POA: Diagnosis not present

## 2020-05-04 DIAGNOSIS — I2699 Other pulmonary embolism without acute cor pulmonale: Secondary | ICD-10-CM

## 2020-05-04 DIAGNOSIS — M545 Low back pain, unspecified: Secondary | ICD-10-CM | POA: Diagnosis not present

## 2020-05-04 DIAGNOSIS — I82403 Acute embolism and thrombosis of unspecified deep veins of lower extremity, bilateral: Secondary | ICD-10-CM | POA: Insufficient documentation

## 2020-05-04 DIAGNOSIS — K661 Hemoperitoneum: Secondary | ICD-10-CM | POA: Diagnosis not present

## 2020-05-04 LAB — CBC WITH DIFFERENTIAL (CANCER CENTER ONLY)
Abs Immature Granulocytes: 0.02 10*3/uL (ref 0.00–0.07)
Basophils Absolute: 0.1 10*3/uL (ref 0.0–0.1)
Basophils Relative: 1 %
Eosinophils Absolute: 0.4 10*3/uL (ref 0.0–0.5)
Eosinophils Relative: 6 %
HCT: 41.7 % (ref 36.0–46.0)
Hemoglobin: 13.5 g/dL (ref 12.0–15.0)
Immature Granulocytes: 0 %
Lymphocytes Relative: 39 %
Lymphs Abs: 2.8 10*3/uL (ref 0.7–4.0)
MCH: 26.4 pg (ref 26.0–34.0)
MCHC: 32.4 g/dL (ref 30.0–36.0)
MCV: 81.6 fL (ref 80.0–100.0)
Monocytes Absolute: 0.5 10*3/uL (ref 0.1–1.0)
Monocytes Relative: 8 %
Neutro Abs: 3.3 10*3/uL (ref 1.7–7.7)
Neutrophils Relative %: 46 %
Platelet Count: 181 10*3/uL (ref 150–400)
RBC: 5.11 MIL/uL (ref 3.87–5.11)
RDW: 17.2 % — ABNORMAL HIGH (ref 11.5–15.5)
WBC Count: 7.1 10*3/uL (ref 4.0–10.5)
nRBC: 0 % (ref 0.0–0.2)

## 2020-05-04 NOTE — Assessment & Plan Note (Signed)
Bilateral PE with right-sided heart strain and extensive bilateral lower extremity DVTs with extension into the iliac and IVC. IVC filter placed Mechanical thrombectomy Retroperitoneal hematoma led to severe anemia requiring blood transfusion Lab review:  06/11/2019: Hemoglobin 12.1 06/12/2019: Hemoglobin 13  Current treatment: Anticoagulation with Xarelto for 1 year (started 05/05/2019) Xarelto toxicities: None Etiology: Covid pneumonia related thromboembolic disease.  Return to clinic in 1 year for follow-up

## 2020-05-07 ENCOUNTER — Telehealth: Payer: Self-pay | Admitting: Hematology and Oncology

## 2020-05-07 NOTE — Telephone Encounter (Signed)
No 12/20 los, no changes made to pt schedule  

## 2020-07-28 DIAGNOSIS — E119 Type 2 diabetes mellitus without complications: Secondary | ICD-10-CM | POA: Diagnosis not present

## 2020-07-28 DIAGNOSIS — E782 Mixed hyperlipidemia: Secondary | ICD-10-CM | POA: Diagnosis not present

## 2020-07-28 DIAGNOSIS — I10 Essential (primary) hypertension: Secondary | ICD-10-CM | POA: Diagnosis not present

## 2020-10-28 DIAGNOSIS — E119 Type 2 diabetes mellitus without complications: Secondary | ICD-10-CM | POA: Diagnosis not present

## 2020-10-28 DIAGNOSIS — E782 Mixed hyperlipidemia: Secondary | ICD-10-CM | POA: Diagnosis not present

## 2020-10-28 DIAGNOSIS — I10 Essential (primary) hypertension: Secondary | ICD-10-CM | POA: Diagnosis not present

## 2021-01-28 DIAGNOSIS — J309 Allergic rhinitis, unspecified: Secondary | ICD-10-CM | POA: Diagnosis not present

## 2021-01-28 DIAGNOSIS — E782 Mixed hyperlipidemia: Secondary | ICD-10-CM | POA: Diagnosis not present

## 2021-01-28 DIAGNOSIS — I10 Essential (primary) hypertension: Secondary | ICD-10-CM | POA: Diagnosis not present

## 2021-01-28 DIAGNOSIS — E119 Type 2 diabetes mellitus without complications: Secondary | ICD-10-CM | POA: Diagnosis not present

## 2021-01-28 DIAGNOSIS — F418 Other specified anxiety disorders: Secondary | ICD-10-CM | POA: Diagnosis not present

## 2021-01-28 DIAGNOSIS — K219 Gastro-esophageal reflux disease without esophagitis: Secondary | ICD-10-CM | POA: Diagnosis not present

## 2021-01-29 DIAGNOSIS — M1612 Unilateral primary osteoarthritis, left hip: Secondary | ICD-10-CM | POA: Diagnosis not present

## 2021-01-29 DIAGNOSIS — M7062 Trochanteric bursitis, left hip: Secondary | ICD-10-CM | POA: Diagnosis not present

## 2021-04-29 DIAGNOSIS — E119 Type 2 diabetes mellitus without complications: Secondary | ICD-10-CM | POA: Diagnosis not present

## 2021-11-17 IMAGING — CT CT ABD-PELV W/O CM
2 of 4 series · 16 of 46 positions shown, 18 images · non-contrast
Comparison: 04/27/2019

CLINICAL DATA: Follow-up of retroperitoneal hematoma. Pulmonary
emboli.

EXAM:
CT ABDOMEN AND PELVIS WITHOUT CONTRAST
TECHNIQUE: Multidetector CT imaging of the abdomen and pelvis was performed
following the standard protocol without IV contrast.

[Series 3: abd/ pelvis 5.0 i30f 2 · axial · 0.98mm/px · z∈[-558,-98]mm · 13 of 101 slices shown, 15 images]
[im 5/101  soft-tissue]
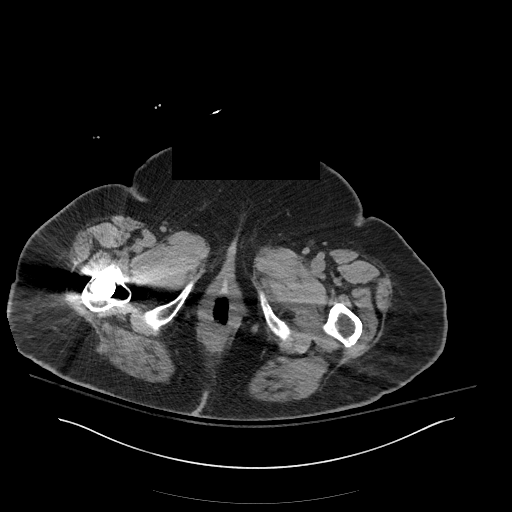
[im 5/101  bone]
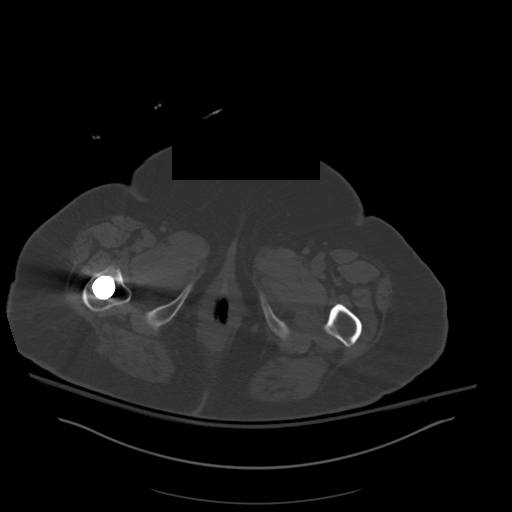
[im 13/101  soft-tissue]
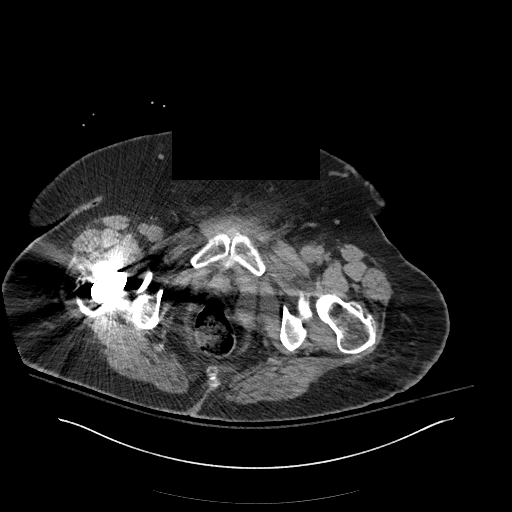
[im 21/101  soft-tissue]
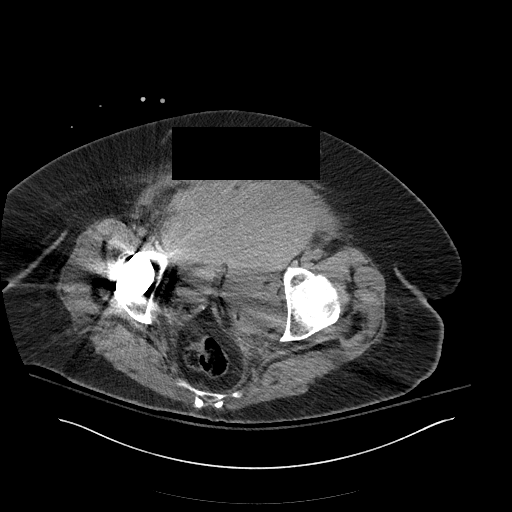
[im 29/101  soft-tissue]
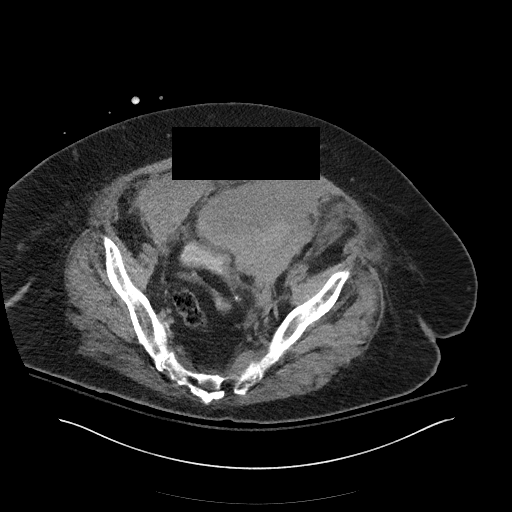
[im 37/101  soft-tissue]
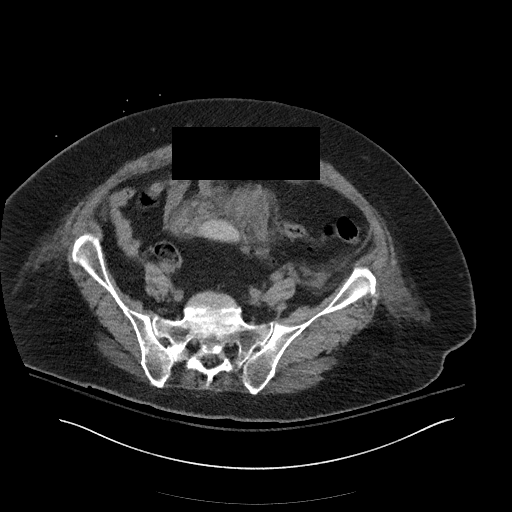
[im 45/101  soft-tissue]
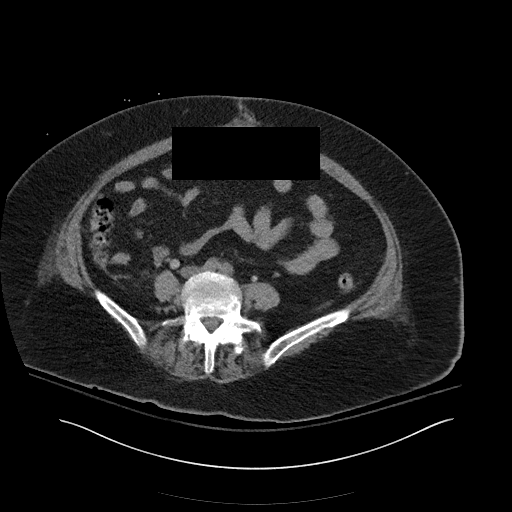
[im 53/101  soft-tissue]
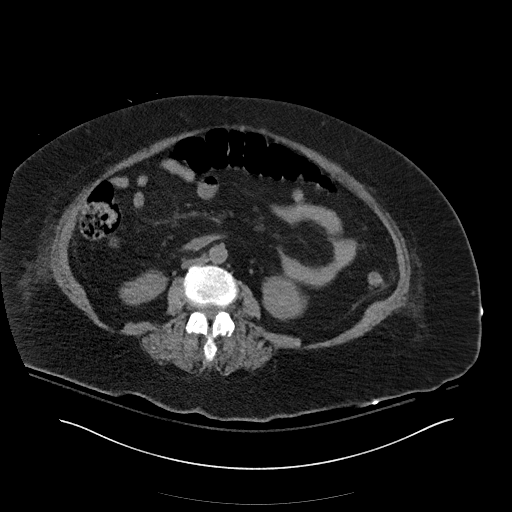
[im 57/101  soft-tissue]
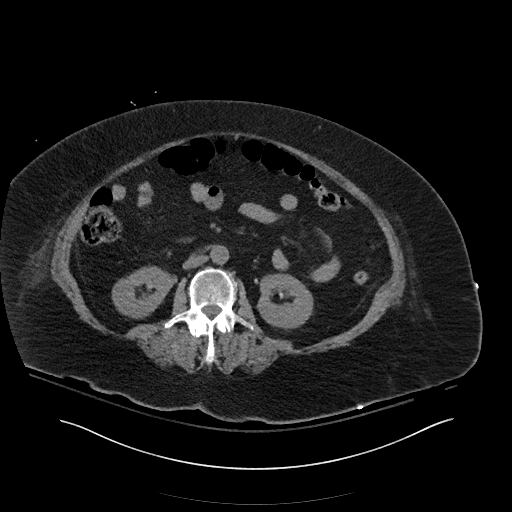
[im 65/101  soft-tissue]
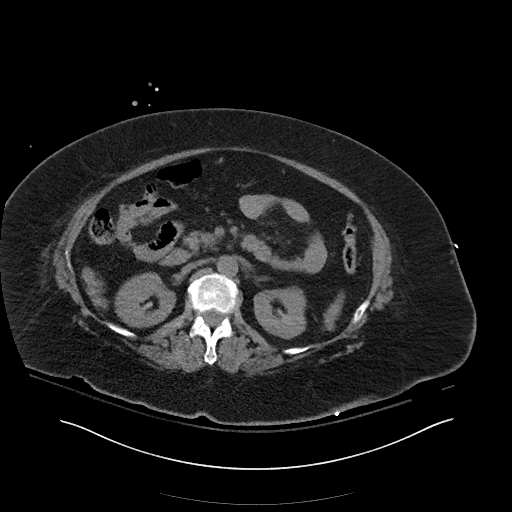
[im 65/101  bone]
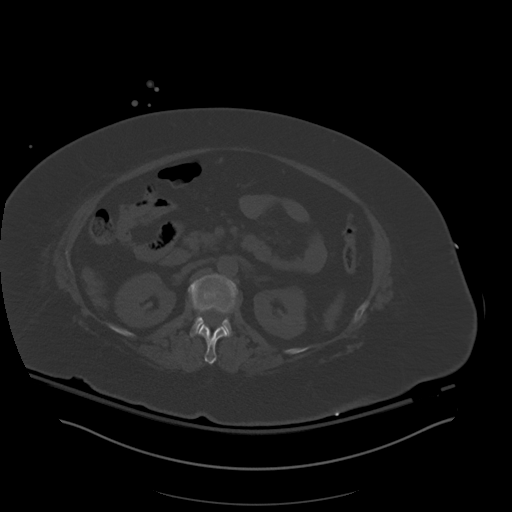
[im 73/101  soft-tissue]
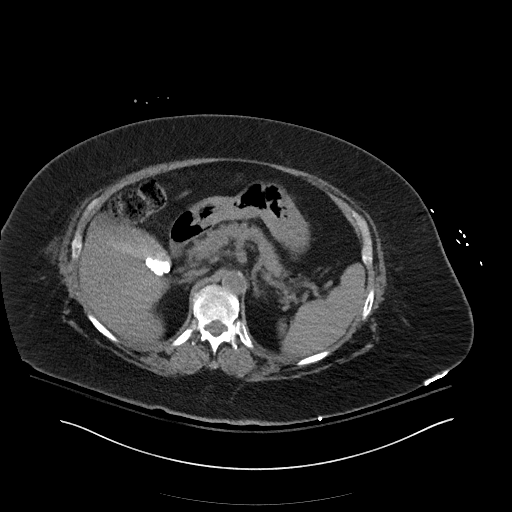
[im 81/101  soft-tissue]
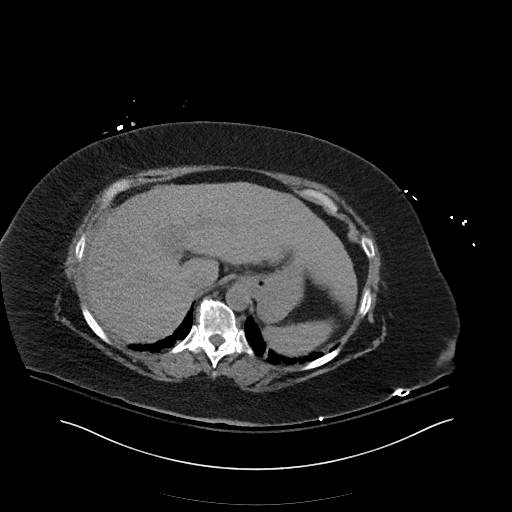
[im 89/101  soft-tissue]
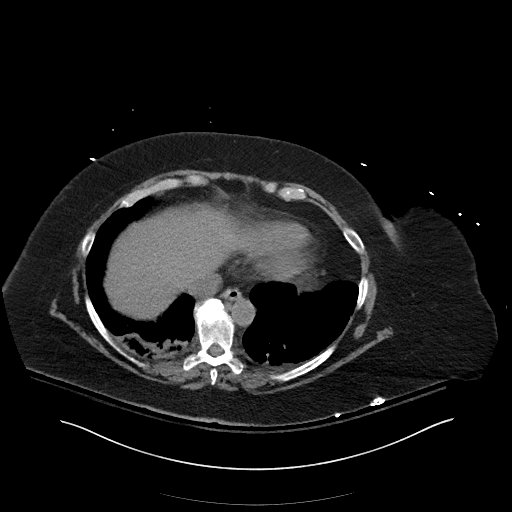
[im 97/101  soft-tissue]
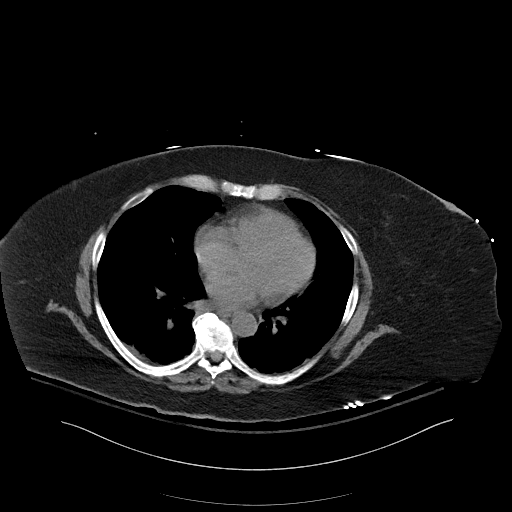

[Series 6: cor st · coronal · 0.95mm/px · 3 of 108 slices shown]
[im 36/108  soft-tissue]
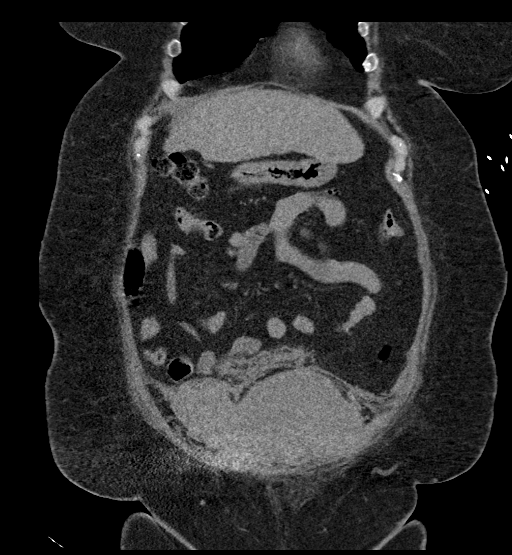
[im 48/108  soft-tissue]
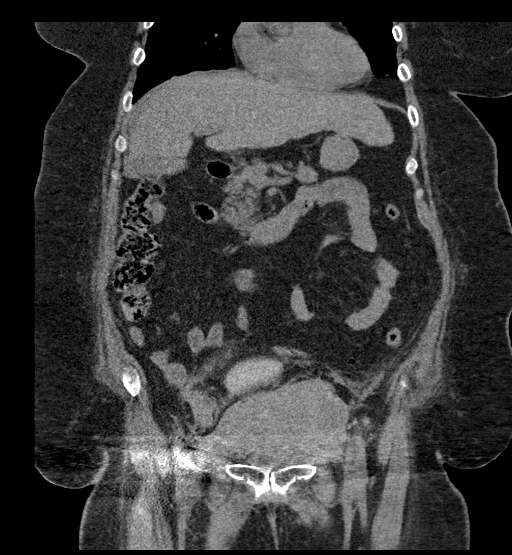
[im 60/108  soft-tissue]
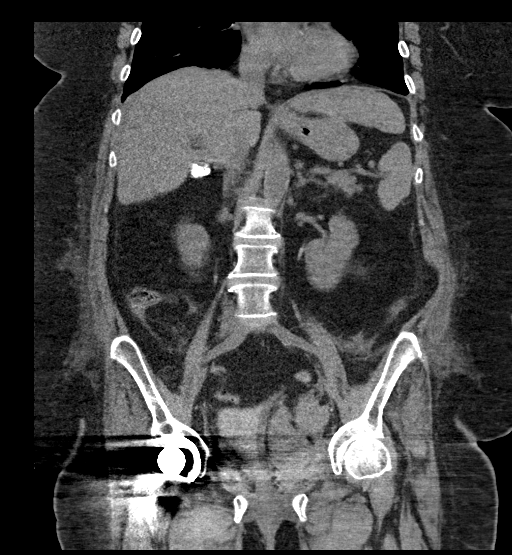

[16 of 46 positions shown; findings below may reference images not displayed]

FINDINGS: Lower chest: There is a hazy area of density at the right lung base
posteriorly which has progressed, consistent with a pulmonary
infarct. There is slight increased atelectasis at the left lung base
posteriorly. Heart size is normal.

Hepatobiliary: Prominent left lobe of the liver with a nodular
contour of the liver suggesting cirrhosis. Multiple gallstones. No
dilated bile ducts.

Pancreas: Unremarkable. No pancreatic ductal dilatation or
surrounding inflammatory changes.

Spleen: Normal in size without focal abnormality.

Adrenals/Urinary Tract: The adrenal glands and kidneys are normal.
No hydronephrosis. The bladder is markedly compressed by the large
pelvic hematoma.

Stomach/Bowel: Stomach is within normal limits. Appendix is not
visualized. No evidence of bowel wall thickening, distention, or
inflammatory changes.

The pelvic hematoma has a mass effect upon the adjacent bowel.

Vascular/Lymphatic: No significant vascular findings are present. No
enlarged abdominal or pelvic lymph nodes.

Reproductive: Status post hysterectomy. No adnexal masses.

Other: The large pelvic hematoma is essentially unchanged since the
prior study. There is minimally more prominent hemorrhage in the
mesentery just above the pelvic hematoma. The hematoma appears to
arise from the inferior aspect of the left rectus muscle.

Musculoskeletal: No acute or significant osseous findings.
IMPRESSION: 1. No significant change in the large pelvic hematoma. The hematoma
appears to arise from the inferior aspect of the left rectus muscle.
2. Slightly more prominent but slight a hemorrhage in the mesentery
just above the pelvic hematoma.
3. Pulmonary infarct at the right lung base posteriorly.
4. Probable cirrhosis.
5. Cholelithiasis.

## 2021-11-18 IMAGING — US IR IVC FILTER PLMT / S&I /IMG GUID/MOD SED
1 series · 2 of 2 positions shown · non-contrast
Comparison: CT abdomen pelvis-04/28/2019

INDICATION: History of [id number] infection complicated by pulmonary embolism and
lower extremity DVT further complicated by development of a
retroperitoneal hematoma following the initiation anticoagulation.

As such, patient is no longer a candidate for anticoagulation the
given presence of lower extremity DVT, request made for placement of
an IVC filter for temporary caval interruption purposes.
EXAM:
ULTRASOUND GUIDANCE FOR VASCULAR ACCESS
IVC CATHETERIZATION AND VENOGRAM
IVC FILTER INSERTION

[Series 1: ir (id) (id)/(id)/(id) ir · 2 of 2 slices shown]
[im 1/2]
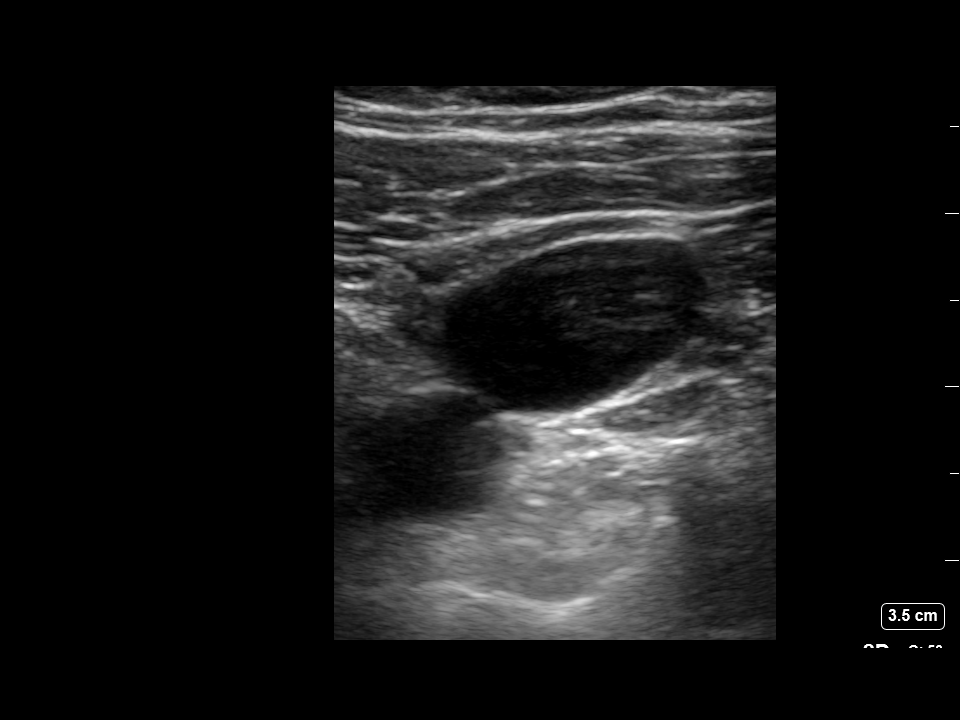
[im 2/2]
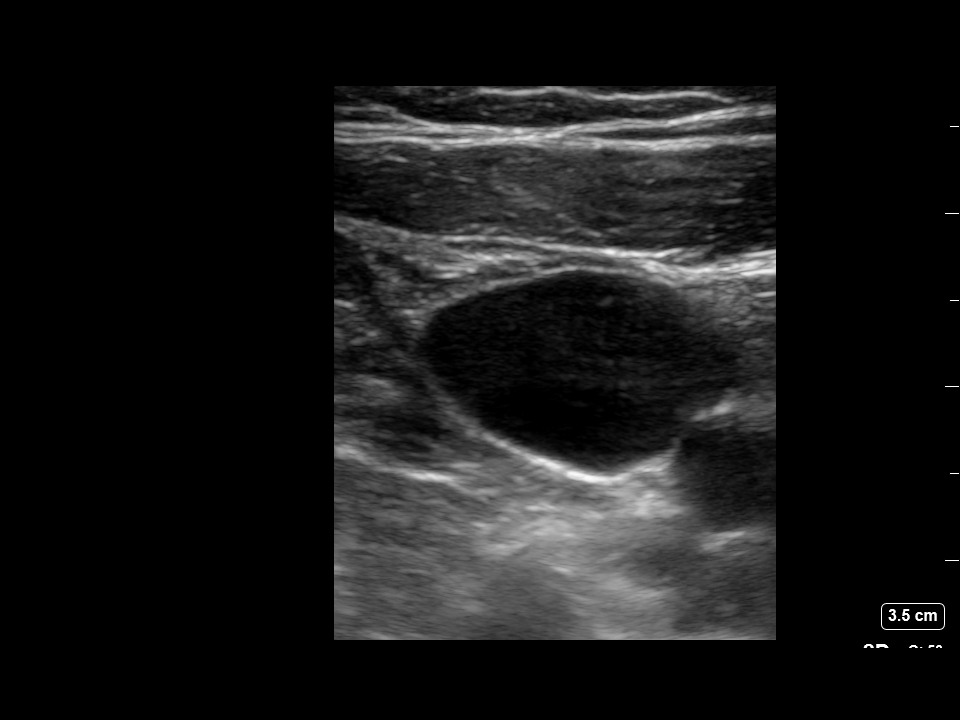

[2 of 2 positions shown; findings below may reference images not displayed]

MEDICATIONS:
None.

ANESTHESIA/SEDATION:
Fentanyl 50 mcg IV; Versed 1 mg IV

Sedation Time: 10 minutes; The patient was continuously monitored
during the procedure by the interventional radiology nurse under my
direct supervision.

CONTRAST:  40 cc [id number]

FLUOROSCOPY TIME:  2 minutes, 30 seconds (31.5 mGy)

COMPLICATIONS:
None immediate

PROCEDURE:
Informed consent was obtained from the patient following explanation
of the procedure, risks, benefits and alternatives. The patient
understands, agrees and consents for the procedure. All questions
were addressed. A time out was performed prior to the initiation of
the procedure.

Maximal barrier sterile technique utilized including caps, mask,
sterile gowns, sterile gloves, large sterile drape, hand hygiene,
and Betadine prep.

Under sterile condition and local anesthesia, right internal jugular
venous access was performed with ultrasound. An ultrasound image was
saved and sent to PACS. Over a guidewire, the IVC filter delivery
sheath and inner dilator were advanced into the IVC just above the
IVC bifurcation. Contrast injection was performed for an IVC
venogram.

Through the delivery sheath, a retrievable Denali IVC filter was
deployed below the level of the renal veins and above the IVC
bifurcation. Limited post deployment venacavagram was performed.

The delivery sheath was removed and hemostasis was obtained with
manual compression. A dressing was placed. The patient tolerated the
procedure well without immediate post procedural complication.
FINDINGS: The IVC is patent. No evidence of thrombus, stenosis, or occlusion.
No variant venous anatomy. Successful placement of the IVC filter
below the level of the renal veins.

Incidentally noted laminated gallstones overlying the right upper
abdominal quadrant as demonstrated on preceding abdominal CT.
IMPRESSION: Successful ultrasound and fluoroscopically guided placement of an
infrarenal retrievable IVC filter via right jugular approach.

PLAN:
This IVC filter is potentially retrievable. The patient will be
approximately 8-12 weeks. Further recommendations regarding filter
retrieval, continued surveillance or declaration of device
permanence, will be made at that time.

## 2024-01-09 ENCOUNTER — Telehealth (HOSPITAL_COMMUNITY): Payer: Self-pay | Admitting: *Deleted

## 2024-01-09 NOTE — Telephone Encounter (Signed)
 Received requested via proficient from Dr Redell Shoals requesting temporary IVC filter placement prior to left hip.  I will scan the document into the media tab and inform Alan.

## 2024-01-10 ENCOUNTER — Telehealth: Payer: Self-pay

## 2024-01-10 NOTE — Telephone Encounter (Signed)
 Referral rec'd from Dr. Fidel.  Will schedule IVC Filter placement with Dr. [name] when orthopedic surgery is scheduled.

## 2024-01-30 ENCOUNTER — Other Ambulatory Visit: Payer: Self-pay

## 2024-01-30 DIAGNOSIS — Z86718 Personal history of other venous thrombosis and embolism: Secondary | ICD-10-CM

## 2024-02-09 NOTE — Progress Notes (Signed)
 Sent message, via epic in basket, requesting orders in epic from Careers adviser.

## 2024-02-10 ENCOUNTER — Ambulatory Visit: Payer: Self-pay | Admitting: Student

## 2024-02-10 DIAGNOSIS — E119 Type 2 diabetes mellitus without complications: Secondary | ICD-10-CM

## 2024-02-12 ENCOUNTER — Ambulatory Visit (HOSPITAL_COMMUNITY)
Admission: RE | Admit: 2024-02-12 | Discharge: 2024-02-12 | Disposition: A | Discharge status: Home / Self Care | Attending: Vascular Surgery | Admitting: Vascular Surgery

## 2024-02-12 ENCOUNTER — Other Ambulatory Visit: Payer: Self-pay

## 2024-02-12 ENCOUNTER — Encounter (HOSPITAL_COMMUNITY)
Admission: RE | Disposition: A | Discharge status: Home / Self Care | Payer: Self-pay | Source: Home / Self Care | Attending: Vascular Surgery

## 2024-02-12 DIAGNOSIS — Z86718 Personal history of other venous thrombosis and embolism: Secondary | ICD-10-CM | POA: Insufficient documentation

## 2024-02-12 DIAGNOSIS — Z408 Encounter for other prophylactic surgery: Secondary | ICD-10-CM

## 2024-02-12 DIAGNOSIS — Z96653 Presence of artificial knee joint, bilateral: Secondary | ICD-10-CM | POA: Insufficient documentation

## 2024-02-12 DIAGNOSIS — Z86711 Personal history of pulmonary embolism: Secondary | ICD-10-CM | POA: Diagnosis not present

## 2024-02-12 DIAGNOSIS — Z4589 Encounter for adjustment and management of other implanted devices: Secondary | ICD-10-CM | POA: Diagnosis present

## 2024-02-12 DIAGNOSIS — Z96642 Presence of left artificial hip joint: Secondary | ICD-10-CM | POA: Insufficient documentation

## 2024-02-12 HISTORY — PX: IVC FILTER INSERTION: CATH118245

## 2024-02-12 LAB — POCT I-STAT, CHEM 8
BUN: 12 mg/dL (ref 8–23)
Calcium, Ion: 1.26 mmol/L (ref 1.15–1.40)
Chloride: 107 mmol/L (ref 98–111)
Creatinine, Ser: 0.8 mg/dL (ref 0.44–1.00)
Glucose, Bld: 140 mg/dL — ABNORMAL HIGH (ref 70–99)
HCT: 38 % (ref 36.0–46.0)
Hemoglobin: 12.9 g/dL (ref 12.0–15.0)
Potassium: 4.1 mmol/L (ref 3.5–5.1)
Sodium: 141 mmol/L (ref 135–145)
TCO2: 23 mmol/L (ref 22–32)

## 2024-02-12 LAB — GLUCOSE, CAPILLARY: Glucose-Capillary: 153 mg/dL — ABNORMAL HIGH (ref 70–99)

## 2024-02-12 SURGERY — IVC FILTER INSERTION
Anesthesia: LOCAL

## 2024-02-12 MED ORDER — MIDAZOLAM HCL 2 MG/2ML IJ SOLN
INTRAMUSCULAR | Status: AC
  Filled 2024-02-12: qty 2

## 2024-02-12 MED ORDER — LIDOCAINE HCL (PF) 1 % IJ SOLN
INTRAMUSCULAR | Status: AC
  Filled 2024-02-12: qty 30

## 2024-02-12 MED ORDER — SODIUM CHLORIDE 0.9 % IV SOLN
INTRAVENOUS | Status: DC
Start: 2024-02-12 — End: 2024-02-12

## 2024-02-12 MED ORDER — FENTANYL CITRATE (PF) 100 MCG/2ML IJ SOLN
INTRAMUSCULAR | Status: AC
  Filled 2024-02-12: qty 2

## 2024-02-12 MED ORDER — LIDOCAINE HCL (PF) 1 % IJ SOLN
INTRAMUSCULAR | Status: DC | PRN
  Administered 2024-02-12: 15 mL

## 2024-02-12 MED ORDER — MIDAZOLAM HCL 2 MG/2ML IJ SOLN
INTRAMUSCULAR | Status: DC | PRN
  Administered 2024-02-12: 1 mg via INTRAVENOUS

## 2024-02-12 MED ORDER — FENTANYL CITRATE (PF) 100 MCG/2ML IJ SOLN
INTRAMUSCULAR | Status: DC | PRN
  Administered 2024-02-12: 25 ug via INTRAVENOUS

## 2024-02-12 MED ORDER — HEPARIN (PORCINE) IN NACL 2000-0.9 UNIT/L-% IV SOLN
INTRAVENOUS | Status: DC | PRN
  Administered 2024-02-12: 1000 mL

## 2024-02-12 MED ORDER — IODIXANOL 320 MG/ML IV SOLN
INTRAVENOUS | Status: DC | PRN
  Administered 2024-02-12: 30 mL

## 2024-02-12 SURGICAL SUPPLY — 6 items
FILTER VC CELECT-FEMORAL (Filter) IMPLANT
KIT MICROPUNCTURE NIT STIFF (SHEATH) IMPLANT
KIT SINGLE USE MANIFOLD (KITS) IMPLANT
SET ATX-X65L (MISCELLANEOUS) IMPLANT
TRAY PV CATH (CUSTOM PROCEDURE TRAY) ×1 IMPLANT
WIRE BENTSON .035X145CM (WIRE) IMPLANT

## 2024-02-12 NOTE — Op Note (Signed)
    Patient name: Regina Munoz MRN: [id number] DOB: 18-Nov-1950 Sex: female  02/12/2024 Pre-operative Diagnosis: History of extensive DVT and PE Post-operative diagnosis:  Same Surgeon:  Penne BROCKS. [name], MD Procedure Performed: 1.  Percutaneous ultrasound-guided cannulation right greater saphenous vein 2.  IVC venogram 3.  Placement of Cook Celect IVC filter and infrarenal IVC 4.  Moderate sedation with fentanyl  and Versed  for 9 minutes  Indications: 73 year old female with history of DVT and PE at the time of COVID-19 with retroperitoneal hemorrhage requiring IVC filter placement and subsequent IVC and iliofemoral thrombectomy.  Filter was then removed.  Patient not on anticoagulation.  She is now planned for left total hip arthroplasty and indicated for IVC filter placement.  Findings: The greater saphenous vein was large and patent and compressible and due to the history of DVT I elected to cannulate the greater saphenous vein.  The IVC was patent and the renal veins were identified and the filter was placed in the midline of the IVC just inferior to the renal veins.   Procedure:  The patient was identified in the holding area and taken to room 8.  The patient was then placed supine on the table and prepped and draped in the usual sterile fashion.  A time out was called.  Ultrasound was used to evaluate the right greater saphenous vein.  The area was anesthetized 1% lidocaine  and cannulated with micropuncture needle followed by wire and sheath.  An ultrasound images saved to the permanent record.  Concomitantly we administered fentanyl  and Versed  as moderate sedation and her vital signs were monitored throughout the case.  I placed a Bentson wire under fluoroscopic guidance and then dilated the wire tract and placed the filter introducer sheath.  IVC venogram was performed and the renal veins were identified and marked on the screen.  The filter was then placed just below the renal veins in the  midline of the IVC.  Single shot was performed.  The introducer sheath was removed and pressure held until hemostasis was obtained.  She tolerated the procedure without immediate complication.  Contrast: 30cc   [name] C. [name], MD [facility] Office: [phone] Pager: [phone]

## 2024-02-12 NOTE — H&P (Signed)
 H+P  History of Present Illness: This is a 73 y.o. 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  She ultimately had IVC filter placement and subsequent treatment of ilio caval and iliofemoral DVT.  The filter was then removed and she did continue DOAC without issues for a couple years but states that she has been off of blood thinners for several years.  She does have a history of right hip and bilateral knee replacements now scheduled for left knee replacement with Dr. Fidel.  She is no longer on blood thinners.  Past Medical History:  Diagnosis Date   Anxiety    Arthritis    Asthma    Complication of anesthesia    COVID-19    Diabetes mellitus without complication (HCC)    Dysrhythmia    palpitations ->20 yrs stress test neg nothing since   GERD (gastroesophageal reflux disease)    Headache    PONV (postoperative nausea and vomiting)     Past Surgical History:  Procedure Laterality Date   ABDOMINAL HYSTERECTOMY     ANTERIOR CERVICAL DECOMP/DISCECTOMY FUSION N/A 02/17/2016   Procedure: ANTERIOR CERVICAL DECOMPRESSION/DISCECTOMY FUSION 1 LEVEL C4-5;  Surgeon: Donaciano Sprang, MD;  Location: [facility] OR;  Service: Orthopedics;  Laterality: N/A;   EYE SURGERY Bilateral    cataracts   HIP CLOSED REDUCTION Right 12/24/2012   Procedure: CLOSED REDUCTION HIP;  Surgeon: Oneil Rodgers Priestly, MD;  Location: [facility];  Service: Orthopedics;  Laterality: Right;   IR IVC FILTER PLMT / S&I /IMG GUID/MOD SED  04/29/2019   IR IVC FILTER RETRIEVAL / S&I /IMG GUID/MOD SED  01/21/2020   IR PTA VENOUS EXCEPT DIALYSIS CIRCUIT  05/16/2019   IR RADIOLOGIST EVAL & MGMT  06/18/2019   IR RADIOLOGIST EVAL & MGMT  01/07/2020   IR THROMBECT SEC MECH MOD SED  05/16/2019   IR THROMBECT VENO MECH MOD SED  05/16/2019   IR US  GUIDE VASC ACCESS LEFT  05/16/2019   IR US  GUIDE VASC ACCESS RIGHT  05/16/2019   IR US  GUIDE VASC ACCESS  RIGHT  05/16/2019   IR VENO/EXT/BI  05/16/2019   IR VENOCAVAGRAM IVC  05/16/2019   JOINT REPLACEMENT  2002   right and left knees   SHOULDER ARTHROSCOPY Left    TOTAL HIP ARTHROPLASTY Right    2002    No Known Allergies  Prior to Admission medications   Medication Sig Start Date End Date Taking? Authorizing Provider  acetaminophen  (TYLENOL ) 500 MG tablet Take 1,000 mg by mouth every 6 (six) hours as needed for mild pain or moderate pain.     [provider]  albuterol  (VENTOLIN  HFA) 108 (90 Base) MCG/ACT inhaler Inhale 2 puffs into the lungs every 4 (four) hours as needed for wheezing or shortness of breath. Patient taking differently: Inhale 1-2 puffs into the lungs every 4 (four) hours as needed for wheezing or shortness of breath.  05/02/19   Regalado, Belkys A, MD  blood glucose meter kit and supplies KIT Dispense based on patient and insurance preference. Use up to four times daily as directed. (FOR ICD-9 250.00, 250.01). 05/02/19   Regalado, Belkys A, MD  buPROPion  (WELLBUTRIN  SR) 150 MG 12 hr tablet Take 150 mg by mouth daily.    [provider]  cetirizine (ZYRTEC) 10 MG tablet Take 10 mg by mouth daily. 02/13/19   [provider]  cholecalciferol  (VITAMIN D ) 1000 UNITS tablet  Take 1,000 Units by mouth daily.    [provider]  diclofenac  Sodium (VOLTAREN ) 1 % GEL Apply 2 g topically daily as needed (for pain).     [provider]  HYDROcodone -acetaminophen  (NORCO) 10-325 MG tablet Take 1 tablet by mouth daily as needed for moderate pain.  10/17/19   [provider]  Insulin  Glargine (LANTUS ) 100 UNIT/ML Solostar Pen Inject 10 Units into the skin daily. Patient not taking: Reported on 01/14/2020 05/02/19   Regalado, Owen A, MD  metFORMIN  (GLUCOPHAGE ) 500 MG tablet Take 500 mg by mouth 2 (two) times daily with a meal.     [provider]  omeprazole (PRILOSEC) 20 MG capsule Take 20 mg by mouth daily.    [provider]    Social History   Socioeconomic History   Marital status: Married    Spouse name: Not on file   Number of children: Not on file   Years of education: Not on file   Highest education level: Not on file  Occupational History   Not on file  Tobacco Use   Smoking status: Never   Smokeless tobacco: Never  Vaping Use   Vaping status: Never Used  Substance and Sexual Activity   Alcohol use: No   Drug use: No   Sexual activity: Not on file  Other Topics Concern   Not on file  Social History Narrative   Not on file   Social Drivers of Health   Financial Resource Strain: Low Risk  (04/22/2019)   Overall Financial Resource Strain (CARDIA)    Difficulty of Paying Living Expenses: Not hard at all  Food Insecurity: No Food Insecurity (04/22/2019)   Hunger Vital Sign    Worried About Running Out of Food in the Last Year: Never true    Ran Out of Food in the Last Year: Never true  Transportation Needs: No Transportation Needs (04/22/2019)   PRAPARE - Administrator, Civil Service (Medical): No    Lack of Transportation (Non-Medical): No  Physical Activity: Unknown (04/22/2019)   Exercise Vital Sign    Days of Exercise per Week: 1 day    Minutes of Exercise per Session: Not on file  Stress: Not on file  Social Connections: Not on file  Intimate Partner Violence: Not on file     No family history on file.  Review of Systems  Constitutional: Negative.   HENT: Negative.    Eyes: Negative.   Cardiovascular:  Positive for leg swelling.  Gastrointestinal: Negative.   Musculoskeletal:  Positive for joint pain.  Skin: Negative.   Neurological: Negative.   Endo/Heme/Allergies: Negative.   Psychiatric/Behavioral: Negative.        Physical Examination Vitals:   02/12/24 0801  BP: (!) 126/50  Pulse: 76  Resp: 18  Temp: 98.2 F (36.8 C)  SpO2: 93%    Physical Exam HENT:     Head: Normocephalic.     Nose: Nose normal.     Mouth/Throat:      Mouth: Mucous membranes are moist.  Cardiovascular:     Rate and Rhythm: Normal rate.  Pulmonary:     Effort: Pulmonary effort is normal.  Musculoskeletal:     Cervical back: Normal range of motion.     Right lower leg: Edema present.     Left lower leg: Edema present.  Skin:    General: Skin is warm.     Capillary Refill: Capillary refill takes less than 2 seconds.  Neurological:  General: No focal deficit present.     Mental Status: She is alert.  Psychiatric:        Mood and Affect: Mood normal.      CBC    Component Value Date/Time   WBC 7.1 05/04/2020 1416   WBC 7.4 09/19/2019 1858   RBC 5.11 05/04/2020 1416   HGB 13.5 05/04/2020 1416   HCT 41.7 05/04/2020 1416   PLT 181 05/04/2020 1416   MCV 81.6 05/04/2020 1416   MCH 26.4 05/04/2020 1416   MCHC 32.4 05/04/2020 1416   RDW 17.2 (H) 05/04/2020 1416   LYMPHSABS 2.8 05/04/2020 1416   MONOABS 0.5 05/04/2020 1416   EOSABS 0.4 05/04/2020 1416   BASOSABS 0.1 05/04/2020 1416    BMET    Component Value Date/Time   NA 138 01/21/2020 1036   K 5.4 (H) 01/21/2020 1036   CL 105 01/21/2020 1036   CO2 25 01/21/2020 1036   GLUCOSE 101 (H) 01/21/2020 1036   BUN 18 01/21/2020 1036   CREATININE 0.84 01/21/2020 1036   CALCIUM 9.3 01/21/2020 1036   GFRNONAA >60 01/21/2020 1036   GFRAA >60 01/21/2020 1036    COAGS: Lab Results  Component Value Date   INR 1.0 05/16/2019   INR 1.2 05/14/2019   INR 1.4 (H) 04/27/2019     Non-Invasive Vascular Imaging:   No new studies   ASSESSMENT/PLAN: This is a 73 y.o. female history of DVT and IVC filter status post removal as above.  She is now indicated for left total hip arthroplasty with high risk for DVT we will plan for filter placement will need removal when she is recovered from all orthopedic interventions.  She demonstrates good understanding consent was signed.  Shakiya Mcneary C. [name], MD [facility] Office: [phone] Pager:  [phone]

## 2024-02-12 NOTE — Progress Notes (Signed)
 Patient discharge instructions and education provided to patient and family member. Patient and family member state they have no further questions at this time. Patient able to ambulate and void without any issues. Patient site is clean,dry, intact and soft upon discharge. IV removed.

## 2024-02-13 ENCOUNTER — Encounter (HOSPITAL_COMMUNITY): Payer: Self-pay | Admitting: Vascular Surgery

## 2024-02-15 ENCOUNTER — Ambulatory Visit: Payer: Self-pay | Admitting: Student

## 2024-02-15 NOTE — Patient Instructions (Signed)
 SURGICAL WAITING ROOM VISITATION  Patients having surgery or a procedure may have no more than 2 support people in the waiting area - these visitors may rotate.    Children under the age of 75 must have an adult with them who is not the patient.  Visitors with respiratory illnesses are discouraged from visiting and should remain at home.  If the patient needs to stay at the hospital during part of their recovery, the visitor guidelines for inpatient rooms apply. Pre-op nurse will coordinate an appropriate time for 1 support person to accompany patient in pre-op.  This support person may not rotate.    Please refer to the [facility] website for the visitor guidelines for Inpatients (after your surgery is over and you are in a regular room).    Your procedure is scheduled on: 02/21/24   Report to [facility] Main Entrance    Report to admitting at 12:00 PM   Call this number if you have problems the morning of surgery [phone]   Do not eat food :After Midnight.   After Midnight you may have the following liquids until 11:30 AM DAY OF SURGERY  Water Non-Citrus Juices (without pulp, NO RED-Apple, White grape, White cranberry) Black Coffee (NO MILK/CREAM OR CREAMERS, sugar ok)  Clear Tea (NO MILK/CREAM OR CREAMERS, sugar ok) regular and decaf                             Plain Jell-O (NO RED)                                           Fruit ices (not with fruit pulp, NO RED)                                     Popsicles (NO RED)                                                               Sports drinks like Gatorade (NO RED)                 The day of surgery:  Drink ONE (1) Pre-Surgery G2 at 11:30 AM the morning of surgery. Drink in one sitting. Do not sip.  This drink was given to you during your hospital  pre-op appointment visit. Nothing else to drink after completing the  Pre-Surgery G2.          If you have questions, please contact your surgeon's  office.   FOLLOW BOWEL PREP AND ANY ADDITIONAL PRE OP INSTRUCTIONS YOU RECEIVED FROM YOUR SURGEON'S OFFICE!!!     Oral Hygiene is also important to reduce your risk of infection.                                    Remember - BRUSH YOUR TEETH THE MORNING OF SURGERY WITH YOUR REGULAR TOOTHPASTE  DENTURES WILL BE REMOVED PRIOR TO SURGERY PLEASE DO NOT APPLY Poly grip OR ADHESIVES!!!  Stop all vitamins and herbal supplements 7 days before surgery.   Take these medicines the morning of surgery with A SIP OF WATER: Tylenol , Inhalers, Zyrtec, Omeprazole, Sertraline  DO NOT TAKE ANY ORAL DIABETIC MEDICATIONS DAY OF YOUR SURGERY  How to Manage Your Diabetes Before and After Surgery  Why is it important to control my blood sugar before and after surgery? Improving blood sugar levels before and after surgery helps healing and can limit problems. A way of improving blood sugar control is eating a healthy diet by:  Eating less sugar and carbohydrates  Increasing activity/exercise  Talking with your doctor about reaching your blood sugar goals High blood sugars (greater than 180 mg/dL) can raise your risk of infections and slow your recovery, so you will need to focus on controlling your diabetes during the weeks before surgery. Make sure that the doctor who takes care of your diabetes knows about your planned surgery including the date and location.  How do I manage my blood sugar before surgery? Check your blood sugar at least 4 times a day, starting 2 days before surgery, to make sure that the level is not too high or low. Check your blood sugar the morning of your surgery when you wake up and every 2 hours until you get to the Short Stay unit. If your blood sugar is less than 70 mg/dL, you will need to treat for low blood sugar: Do not take insulin . Treat a low blood sugar (less than 70 mg/dL) with  cup of clear juice (cranberry or apple), 4 glucose tablets, OR glucose gel. Recheck blood  sugar in 15 minutes after treatment (to make sure it is greater than 70 mg/dL). If your blood sugar is not greater than 70 mg/dL on recheck, call [phone] for further instructions. Report your blood sugar to the short stay nurse when you get to Short Stay.  If you are admitted to the hospital after surgery: Your blood sugar will be checked by the staff and you will probably be given insulin  after surgery (instead of oral diabetes medicines) to make sure you have good blood sugar levels. The goal for blood sugar control after surgery is 80-180 mg/dL.   WHAT DO I DO ABOUT MY DIABETES MEDICATION?  Do not take oral diabetes medicines (pills) the morning of surgery.  Hold Farxiga for 3 days. Last dose 02/17/24.  THE DAY BEFORE SURGERY, take Metformin  as prescribed.      THE MORNING OF SURGERY, do not take Metformin .  Reviewed and Endorsed by [facility] Patient Education Committee, August 2015                              You may not have any metal on your body including hair pins, jewelry, and body piercing             Do not wear make-up, lotions, powders, perfumes, or deodorant  Do not wear nail polish including gel and S&S, artificial/acrylic nails, or any other type of covering on natural nails including finger and toenails. If you have artificial nails, gel coating, etc. that needs to be removed by a nail salon please have this removed prior to surgery or surgery may need to be canceled/ delayed if the surgeon/ anesthesia feels like they are unable to be safely monitored.   Do not shave  48 hours prior to surgery.    Do not bring valuables to the hospital. [facility]  IS NOT             RESPONSIBLE   FOR VALUABLES.   Contacts, glasses, dentures or bridgework may not be worn into surgery.   Bring small overnight bag day of surgery.   DO NOT BRING YOUR HOME MEDICATIONS TO THE HOSPITAL. PHARMACY WILL DISPENSE MEDICATIONS LISTED ON YOUR MEDICATION LIST TO YOU DURING YOUR ADMISSION  IN THE HOSPITAL!              Please read over the following fact sheets you were given: IF YOU HAVE QUESTIONS ABOUT YOUR PRE-OP INSTRUCTIONS PLEASE CALL [phone].   If you received a COVID test during your pre-op visit  it is requested that you wear a mask when out in public, stay away from anyone that may not be feeling well and notify your surgeon if you develop symptoms. If you test positive for Covid or have been in contact with anyone that has tested positive in the last 10 days please notify you surgeon.      Pre-operative 4 CHG Bath Instructions  DYNA-Hex 4 Chlorhexidine  Gluconate 4% Solution Antiseptic 4 fl. oz   You can play a key role in reducing the risk of infection after surgery. Your skin needs to be as free of germs as possible. You can reduce the number of germs on your skin by washing with CHG (chlorhexidine  gluconate) soap before surgery. CHG is an antiseptic soap that kills germs and continues to kill germs even after washing.   DO NOT use if you have an allergy to chlorhexidine /CHG or antibacterial soaps. If your skin becomes reddened or irritated, stop using the CHG and notify one of our RNs at   Please shower with the CHG soap starting 4 days before surgery using the following schedule:     Please keep in mind the following:  DO NOT shave, including legs and underarms, starting the day of your first shower.   You may shave your face at any point before/day of surgery.  Place clean sheets on your bed the day you start using CHG soap. Use a clean washcloth (not used since being washed) for each shower. DO NOT sleep with pets once you start using the CHG.  CHG Shower Instructions:  If you choose to wash your hair and private area, wash first with your normal shampoo/soap.  After you use shampoo/soap, rinse your hair and body thoroughly to remove shampoo/soap residue.  Turn the water OFF and apply about 3 tablespoons (45 ml) of CHG soap to a CLEAN  washcloth.  Apply CHG soap ONLY FROM YOUR NECK DOWN TO YOUR TOES (washing for 3-5 minutes)  DO NOT use CHG soap on face, private areas, open wounds, or sores.  Pay special attention to the area where your surgery is being performed.  If you are having back surgery, having someone wash your back for you may be helpful. Wait 2 minutes after CHG soap is applied, then you may rinse off the CHG soap.  Pat dry with a clean towel  Put on clean clothes/pajamas   If you choose to wear lotion, please use ONLY the CHG-compatible lotions on the back of this paper.     Additional instructions for the day of surgery: DO NOT APPLY any lotions, deodorants, cologne, or perfumes.   Put on clean/comfortable clothes.  Brush your teeth.  Ask your nurse before applying any prescription medications to the skin.   CHG Compatible Lotions   Aveeno Moisturizing lotion  Cetaphil  Moisturizing Cream  Cetaphil Moisturizing Lotion  Clairol Herbal Essence Moisturizing Lotion, Dry Skin  Clairol Herbal Essence Moisturizing Lotion, Extra Dry Skin  Clairol Herbal Essence Moisturizing Lotion, Normal Skin  Curel Age Defying Therapeutic Moisturizing Lotion with Alpha Hydroxy  Curel Extreme Care Body Lotion  Curel Soothing Hands Moisturizing Hand Lotion  Curel Therapeutic Moisturizing Cream, Fragrance-Free  Curel Therapeutic Moisturizing Lotion, Fragrance-Free  Curel Therapeutic Moisturizing Lotion, Original Formula  Eucerin Daily Replenishing Lotion  Eucerin Dry Skin Therapy Plus Alpha Hydroxy Crme  Eucerin Dry Skin Therapy Plus Alpha Hydroxy Lotion  Eucerin Original Crme  Eucerin Original Lotion  Eucerin Plus Crme Eucerin Plus Lotion  Eucerin TriLipid Replenishing Lotion  Keri Anti-Bacterial Hand Lotion  Keri Deep Conditioning Original Lotion Dry Skin Formula Softly Scented  Keri Deep Conditioning Original Lotion, Fragrance Free Sensitive Skin Formula  Keri Lotion Fast Absorbing Fragrance Free Sensitive Skin  Formula  Keri Lotion Fast Absorbing Softly Scented Dry Skin Formula  Keri Original Lotion  Keri Skin Renewal Lotion Keri Silky Smooth Lotion  Keri Silky Smooth Sensitive Skin Lotion  Nivea Body Creamy Conditioning Oil  Nivea Body Extra Enriched Teacher, adult education Moisturizing Lotion Nivea Crme  Nivea Skin Firming Lotion  NutraDerm 30 Skin Lotion  NutraDerm Skin Lotion  NutraDerm Therapeutic Skin Cream  NutraDerm Therapeutic Skin Lotion  ProShield Protective Hand Cream  Provon moisturizing lotion  View Pre-Surgery Education Videos:  [email or web address]    WHAT IS A BLOOD TRANSFUSION? Blood Transfusion Information  A transfusion is the replacement of blood or some of its parts. Blood is made up of multiple cells which provide different functions. Red blood cells carry oxygen  and are used for blood loss replacement. White blood cells fight against infection. Platelets control bleeding. Plasma helps clot blood. Other blood products are available for specialized needs, such as hemophilia or other clotting disorders. BEFORE THE TRANSFUSION  Who gives blood for transfusions?  Healthy volunteers who are fully evaluated to make sure their blood is safe. This is blood bank blood. Transfusion therapy is the safest it has ever been in the practice of medicine. Before blood is taken from a donor, a complete history is taken to make sure that person has no history of diseases nor engages in risky social behavior (examples are intravenous drug use or sexual activity with multiple partners). The donor's travel history is screened to minimize risk of transmitting infections, such as malaria. The donated blood is tested for signs of infectious diseases, such as HIV and hepatitis. The blood is then tested to be sure it is compatible with you in order to minimize the chance of a transfusion reaction. If you or  a relative donates blood, this is often done in anticipation of surgery and is not appropriate for emergency situations. It takes many days to process the donated blood. RISKS AND COMPLICATIONS Although transfusion therapy is very safe and saves many lives, the main dangers of transfusion include:  Getting an infectious disease. Developing a transfusion reaction. This is an allergic reaction to something in the blood you were given. Every precaution is taken to prevent this. The decision to have a blood transfusion has been considered carefully by your caregiver before blood is given. Blood is not given unless the benefits outweigh the risks. AFTER THE TRANSFUSION Right after receiving a blood transfusion, you will usually feel much better and more energetic. This is especially true if your red blood cells have gotten low (anemic). The transfusion raises  the level of the red blood cells which carry oxygen , and this usually causes an energy increase. The nurse administering the transfusion will monitor you carefully for complications. HOME CARE INSTRUCTIONS  No special instructions are needed after a transfusion. You may find your energy is better. Speak with your caregiver about any limitations on activity for underlying diseases you may have. SEEK MEDICAL CARE IF:  Your condition is not improving after your transfusion. You develop redness or irritation at the intravenous (IV) site. SEEK IMMEDIATE MEDICAL CARE IF:  Any of the following symptoms occur over the next 12 hours: Shaking chills. You have a temperature by mouth above 102 F (38.9 C), not controlled by medicine. Chest, back, or muscle pain. People around you feel you are not acting correctly or are confused. Shortness of breath or difficulty breathing. Dizziness and fainting. You get a rash or develop hives. You have a decrease in urine output. Your urine turns a dark color or changes to pink, red, or brown. Any of the following  symptoms occur over the next 10 days: You have a temperature by mouth above 102 F (38.9 C), not controlled by medicine. Shortness of breath. Weakness after normal activity. The white part of the eye turns yellow (jaundice). You have a decrease in the amount of urine or are urinating less often. Your urine turns a dark color or changes to pink, red, or brown. Document Released: 04/29/2000 Document Revised: 07/25/2011 Document Reviewed: 12/17/2007 [facility] Patient Information 2014 [place], [place].  _______________________________________________________________________

## 2024-02-15 NOTE — Progress Notes (Signed)
 COVID Vaccine Completed: yes  Date of COVID positive in last 90 days:  PCP - Dorothey Cassette, NP Cardiologist - n/a  Chest x-ray - N/A EKG - 02/12/24 Epic Stress Test - N/A ECHO - 04/25/19 Epic Cardiac Cath - n/a Pacemaker/ICD device last checked:N/A Spinal Cord Stimulator:N/A  Bowel Prep - N/A  Sleep Study - N/A CPAP -   Fasting Blood Sugar - 130-150 Checks Blood Sugar once a week  Last dose of GLP1 agonist-  N/A GLP1 instructions:  Do not take after     Last dose of SGLT-2 inhibitors-  Farxiga, hold 3 days SGLT-2 instructions:  Do not take after  02/17/24   Blood Thinner Instructions: N/A Last dose:   Time: Aspirin  Instructions:N/A Last Dose:  Activity level: Can go up a flight of stairs and perform activities of daily living without stopping and without symptoms of chest pain. SOB with activity, has hx of asthma, nothing new per pt   Anesthesia review: PE, DVT, SIRS, DM2, IVC filter in place, asthma  Patient denies shortness of breath, fever, cough and chest pain at PAT appointment  Patient verbalized understanding of instructions that were given to them at the PAT appointment. Patient was also instructed that they will need to review over the PAT instructions again at home before surgery.

## 2024-02-16 ENCOUNTER — Encounter (HOSPITAL_COMMUNITY)
Admission: RE | Admit: 2024-02-16 | Discharge: 2024-02-16 | Disposition: A | Discharge status: Home / Self Care | Source: Ambulatory Visit | Attending: Orthopedic Surgery | Admitting: Orthopedic Surgery

## 2024-02-16 ENCOUNTER — Encounter (HOSPITAL_COMMUNITY): Payer: Self-pay

## 2024-02-16 ENCOUNTER — Other Ambulatory Visit: Payer: Self-pay

## 2024-02-16 VITALS — BP 133/84 | HR 78 | Temp 98.4°F | Resp 16 | Ht 63.5 in | Wt 222.0 lb

## 2024-02-16 DIAGNOSIS — Z01812 Encounter for preprocedural laboratory examination: Secondary | ICD-10-CM | POA: Diagnosis present

## 2024-02-16 DIAGNOSIS — Z981 Arthrodesis status: Secondary | ICD-10-CM | POA: Insufficient documentation

## 2024-02-16 DIAGNOSIS — E119 Type 2 diabetes mellitus without complications: Secondary | ICD-10-CM | POA: Diagnosis not present

## 2024-02-16 DIAGNOSIS — Z86718 Personal history of other venous thrombosis and embolism: Secondary | ICD-10-CM | POA: Diagnosis not present

## 2024-02-16 DIAGNOSIS — Z7984 Long term (current) use of oral hypoglycemic drugs: Secondary | ICD-10-CM | POA: Insufficient documentation

## 2024-02-16 DIAGNOSIS — M1612 Unilateral primary osteoarthritis, left hip: Secondary | ICD-10-CM | POA: Diagnosis not present

## 2024-02-16 DIAGNOSIS — Z01818 Encounter for other preprocedural examination: Secondary | ICD-10-CM

## 2024-02-16 DIAGNOSIS — Z86711 Personal history of pulmonary embolism: Secondary | ICD-10-CM | POA: Diagnosis not present

## 2024-02-16 HISTORY — DX: Pneumonia, unspecified organism: J18.9

## 2024-02-16 HISTORY — DX: Depression, unspecified: F32.A

## 2024-02-16 HISTORY — DX: Acute embolism and thrombosis of unspecified deep veins of unspecified lower extremity: I82.409

## 2024-02-16 LAB — HEMOGLOBIN A1C
Hgb A1c MFr Bld: 6.4 % — ABNORMAL HIGH (ref 4.8–5.6)
Mean Plasma Glucose: 136.98 mg/dL

## 2024-02-16 LAB — BASIC METABOLIC PANEL WITH GFR
Anion gap: 10 (ref 5–15)
BUN: 14 mg/dL (ref 8–23)
CO2: 23 mmol/L (ref 22–32)
Calcium: 9.5 mg/dL (ref 8.9–10.3)
Chloride: 105 mmol/L (ref 98–111)
Creatinine, Ser: 0.84 mg/dL (ref 0.44–1.00)
GFR, Estimated: >60 mL/min (ref >60)
Glucose, Bld: 114 mg/dL — ABNORMAL HIGH (ref 70–99)
Potassium: 4.8 mmol/L (ref 3.5–5.1)
Sodium: 138 mmol/L (ref 135–145)

## 2024-02-16 LAB — CBC
HCT: 45 % (ref 36.0–46.0)
Hemoglobin: 13 g/dL (ref 12.0–15.0)
MCH: 23.3 pg — ABNORMAL LOW (ref 26.0–34.0)
MCHC: 28.9 g/dL — ABNORMAL LOW (ref 30.0–36.0)
MCV: 80.6 fL (ref 80.0–100.0)
Platelets: 170 K/uL (ref 150–400)
RBC: 5.58 MIL/uL — ABNORMAL HIGH (ref 3.87–5.11)
RDW: 19.1 % — ABNORMAL HIGH (ref 11.5–15.5)
WBC: 6.2 K/uL (ref 4.0–10.5)
nRBC: 0 % (ref 0.0–0.2)

## 2024-02-16 LAB — SURGICAL PCR SCREEN
MRSA, PCR: NEGATIVE
Staphylococcus aureus: POSITIVE — AB

## 2024-02-16 LAB — GLUCOSE, CAPILLARY: Glucose-Capillary: 132 mg/dL — ABNORMAL HIGH (ref 70–99)

## 2024-02-16 NOTE — Progress Notes (Signed)
 [facility] positive results routed to Dr. Fidel

## 2024-02-19 ENCOUNTER — Encounter (HOSPITAL_COMMUNITY): Payer: Self-pay

## 2024-02-19 NOTE — Anesthesia Preprocedure Evaluation (Signed)
 Anesthesia Evaluation  Patient identified by MRN, date of birth, ID band Patient awake    Reviewed: Allergy & Precautions, NPO status , Patient's Chart, lab work & pertinent test results  History of Anesthesia Complications (+) PONV and history of anesthetic complications  Airway Mallampati: III  TM Distance: >3 FB Neck ROM: Full   Comment: Previous Glidescope intubations Dental  (+) Partial Upper, Dental Advisory Given   Pulmonary neg shortness of breath, asthma (seasonal, used inhaler prophylactically this morning) , neg sleep apnea, neg recent URI, PE   Pulmonary exam normal breath sounds clear to auscultation       Cardiovascular (-) hypertension(-) angina + DVT  (-) Past MI, (-) Cardiac Stents and (-) CABG + dysrhythmias (palpitations)  Rhythm:Regular Rate:Normal  TTE 04/25/2019: IMPRESSIONS    1. Left ventricular ejection fraction, by visual estimation, is 50 to  55%. The left ventricle has normal function. There is no left ventricular  hypertrophy.   2. Left ventricular diastolic parameters are consistent with Grade I  diastolic dysfunction (impaired relaxation).   3. The left ventricle has no regional wall motion abnormalities.   4. Global right ventricle has normal systolic function.The right  ventricular size is normal.   5. Left atrial size was normal.   6. Right atrial size was normal.   7. The mitral valve is normal in structure. No evidence of mitral valve  regurgitation. No evidence of mitral stenosis.   8. The tricuspid valve is normal in structure. Tricuspid valve  regurgitation is trivial.   9. The aortic valve is tricuspid. Aortic valve regurgitation is not  visualized. Mild aortic valve sclerosis without stenosis.  10. The pulmonic valve was normal in structure. Pulmonic valve  regurgitation is not visualized.  11. Mildly elevated pulmonary artery systolic pressure.  12. The inferior vena cava is normal  in size with greater than 50%  respiratory variability, suggesting right atrial pressure of 3 mmHg.  13. Low normal LV systolic function; grade 1 diastolic dysfunction;  moderate RV dysfunction.     Neuro/Psych  Headaches, neg Seizures PSYCHIATRIC DISORDERS Anxiety Depression     Neuromuscular disease (s/p cervical fusion)    GI/Hepatic Neg liver ROS,GERD  Medicated,,  Endo/Other  diabetes (Hgb A1c 6.4), Well Controlled, Type 2, Oral Hypoglycemic Agents    Renal/GU negative Renal ROS     Musculoskeletal  (+) Arthritis , Osteoarthritis,    Abdominal  (+) + obese  Peds  Hematology  (+) Blood dyscrasia (h/o clot) Lab Results      Component                Value               Date                      WBC                      6.2                 02/16/2024                HGB                      13.0                02/16/2024                HCT  45.0                02/16/2024                MCV                      80.6                02/16/2024                PLT                      170                 02/16/2024              Anesthesia Other Findings S/p prophylactic IVC filter insertion 02/12/2024 due to h/o PE  Last Farxiga: >3 days ago  Reproductive/Obstetrics                              Anesthesia Physical Anesthesia Plan  ASA: 3  Anesthesia Plan: MAC and Spinal   Post-op Pain Management:    Induction: Intravenous  PONV Risk Score and Plan: 3 and Ondansetron , Dexamethasone , Propofol  infusion, TIVA and Treatment may vary due to age or medical condition  Airway Management Planned: Natural Airway and Nasal Cannula  Additional Equipment:   Intra-op Plan:   Post-operative Plan:   Informed Consent: I have reviewed the patients History and Physical, chart, labs and discussed the procedure including the risks, benefits and alternatives for the proposed anesthesia with the patient or authorized representative who has  indicated his/her understanding and acceptance.     Dental advisory given  Plan Discussed with: CRNA and Anesthesiologist  Anesthesia Plan Comments: (See PAT note from 10/3  I have discussed risks of neuraxial anesthesia including but not limited to infection, bleeding, nerve injury, back pain, headache, seizures, and failure of block. Patient denies bleeding disorders and is not currently anticoagulated. Labs have been reviewed. Risks and benefits discussed. All patient's questions answered.   Discussed with patient risks of MAC including, but not limited to, minor pain or discomfort, hearing people in the room, and possible need for backup general anesthesia. Risks for general anesthesia also discussed including, but not limited to, sore throat, hoarse voice, chipped/damaged teeth, injury to vocal cords, nausea and vomiting, allergic reactions, lung infection, heart attack, stroke, and death. All questions answered. )         Anesthesia Quick Evaluation

## 2024-02-19 NOTE — Progress Notes (Signed)
 Case: [id number] Date/Time: 02/21/24 1415   Procedure: ARTHROPLASTY, HIP, TOTAL, ANTERIOR APPROACH (Left: Hip)   Anesthesia type: Spinal   Pre-op diagnosis: Left hip osteoarthritis   Location: [facility]OM 09 / [facility]   Surgeons: Fidel Rogue, MD       DISCUSSION: Regina [facility] is a 73 yo female with PMH of asthma, hx of DVT/PE, GERD, DM (A1c 6.4), anxiety, depression, arthritis, s/p ACDF C4-5 (2017)  Patient with hx of DVT/PE in 2021 in the setting of COVID 19 infection. Previously on anticoagulation but seen by Hematology and advised she could stop OAC. She was referred to Vascular for insertion on IVC filter which was done on 02/12/24.  Medical clearance signed (scanned in media 9/16) that patient is cleared for surgery.   VS: BP 133/84   Pulse 78   Temp 36.9 C (Oral)   Resp 16   Ht 5' 3.5 (1.613 m)   Wt 100.7 kg   SpO2 95%   BMI 38.71 kg/m   PROVIDERS: Renato Dorothey HERO, NP   LABS: Labs reviewed: Acceptable for surgery. (all labs ordered are listed, but only abnormal results are displayed)  Labs Reviewed  SURGICAL PCR SCREEN - Abnormal; Notable for the following components:      Result Value   Staphylococcus aureus POSITIVE (*)    All other components within normal limits  HEMOGLOBIN A1C - Abnormal; Notable for the following components:   Hgb A1c MFr Bld 6.4 (*)    All other components within normal limits  BASIC METABOLIC PANEL WITH GFR - Abnormal; Notable for the following components:   Glucose, Bld 114 (*)    All other components within normal limits  CBC - Abnormal; Notable for the following components:   RBC 5.58 (*)    MCH 23.3 (*)    MCHC 28.9 (*)    RDW 19.1 (*)    All other components within normal limits  GLUCOSE, CAPILLARY - Abnormal; Notable for the following components:   Glucose-Capillary 132 (*)    All other components within normal limits  TYPE AND SCREEN     IMAGES:   EKG 02/12/24:  Normal sinus rhythm Low voltage QRS Cannot rule  out Anterior infarct , age undetermined Abnormal ECG  Echo 04/25/2019:  IMPRESSIONS    1. Left ventricular ejection fraction, by visual estimation, is 50 to 55%. The left ventricle has normal function. There is no left ventricular hypertrophy.  2. Left ventricular diastolic parameters are consistent with Grade I diastolic dysfunction (impaired relaxation).  3. The left ventricle has no regional wall motion abnormalities.  4. Global right ventricle has normal systolic function.The right ventricular size is normal.  5. Left atrial size was normal.  6. Right atrial size was normal.  7. The mitral valve is normal in structure. No evidence of mitral valve regurgitation. No evidence of mitral stenosis.  8. The tricuspid valve is normal in structure. Tricuspid valve regurgitation is trivial.  9. The aortic valve is tricuspid. Aortic valve regurgitation is not visualized. Mild aortic valve sclerosis without stenosis. 10. The pulmonic valve was normal in structure. Pulmonic valve regurgitation is not visualized. 11. Mildly elevated pulmonary artery systolic pressure. 12. The inferior vena cava is normal in size with greater than 50% respiratory variability, suggesting right atrial pressure of 3 mmHg. 13. Low normal LV systolic function; grade 1 diastolic dysfunction; moderate RV dysfunction.  Past Medical History:  Diagnosis Date   Anxiety    Arthritis    Asthma  Complication of anesthesia    COVID-19    Depression    Diabetes mellitus without complication (HCC)    DVT (deep venous thrombosis) (HCC)    Dysrhythmia    palpitations ->20 yrs stress test neg nothing since   GERD (gastroesophageal reflux disease)    Headache    Pneumonia    PONV (postoperative nausea and vomiting)     Past Surgical History:  Procedure Laterality Date   ABDOMINAL HYSTERECTOMY     ANTERIOR CERVICAL DECOMP/DISCECTOMY FUSION N/A 02/17/2016   Procedure: ANTERIOR CERVICAL DECOMPRESSION/DISCECTOMY  FUSION 1 LEVEL C4-5;  Surgeon: Donaciano Sprang, MD;  Location: [facility];  Service: Orthopedics;  Laterality: N/A;   EYE SURGERY Bilateral    cataracts   FINGER SURGERY Left    thumb tendon surgery   HIP CLOSED REDUCTION Right 12/24/2012   Procedure: CLOSED REDUCTION HIP;  Surgeon: Oneil Rodgers Priestly, MD;  [facility];  Service: Orthopedics;  Laterality: Right;   IR IVC FILTER PLMT / S&I /IMG GUID/MOD SED  04/29/2019   IR IVC FILTER RETRIEVAL / S&I /IMG GUID/MOD SED  01/21/2020   IR PTA VENOUS EXCEPT DIALYSIS CIRCUIT  05/16/2019   IR RADIOLOGIST EVAL & MGMT  06/18/2019   IR RADIOLOGIST EVAL & MGMT  01/07/2020   IR THROMBECT SEC MECH MOD SED  05/16/2019   IR THROMBECT VENO MECH MOD SED  05/16/2019   IR US  GUIDE VASC ACCESS LEFT  05/16/2019   IR US  GUIDE VASC ACCESS RIGHT  05/16/2019   IR US  GUIDE VASC ACCESS RIGHT  05/16/2019   IR VENO/EXT/BI  05/16/2019   IR VENOCAVAGRAM IVC  05/16/2019   IVC FILTER INSERTION N/A 02/12/2024   Procedure: IVC FILTER INSERTION;  Surgeon: [name] Penne Bruckner, MD;  Location: [facility] INVASIVE CV LAB;  Service: Cardiovascular;  Laterality: N/A;   JOINT REPLACEMENT  2002   right and left knees   SHOULDER ARTHROSCOPY Left    TOTAL HIP ARTHROPLASTY Right    2002    MEDICATIONS:  acetaminophen  (TYLENOL ) 500 MG tablet   albuterol  (VENTOLIN  HFA) 108 (90 Base) MCG/ACT inhaler   cetirizine (ZYRTEC) 10 MG tablet   cholecalciferol  (VITAMIN D ) 1000 UNITS tablet   dapagliflozin propanediol (FARXIGA) 10 MG TABS tablet   diclofenac  Sodium (VOLTAREN ) 1 % GEL   metFORMIN  (GLUCOPHAGE ) 500 MG tablet   omeprazole (PRILOSEC) 20 MG capsule   sertraline (ZOLOFT) 25 MG tablet   No current facility-administered medications for this encounter.   [facility] Stay/Anesthesiology [facility] Phone [phone] 02/19/2024 11:03 AM

## 2024-02-20 ENCOUNTER — Ambulatory Visit: Payer: Self-pay | Admitting: Student

## 2024-02-20 NOTE — H&P (View-Only) (Signed)
 TOTAL HIP ADMISSION H&P  Patient is admitted for left total hip arthroplasty.  Subjective:  Chief Complaint: left hip pain  HPI: Reg[facility], 73 y.o. female, has a history of pain and functional disability in the left hip(s) due to arthritis and patient has failed non-surgical conservative treatments for greater than 12 weeks to include NSAID's and/or analgesics, flexibility and strengthening excercises, use of assistive devices, and activity modification.  Onset of symptoms was gradual starting 10 years ago with rapidlly worsening course since that time.The patient noted no past surgery on the left hip(s).  Patient currently rates pain in the left hip at 10 out of 10 with activity. Patient has night pain, worsening of pain with activity and weight bearing, trendelenberg gait, pain that interfers with activities of daily living, and pain with passive range of motion. Patient has evidence of subchondral cysts, subchondral sclerosis, periarticular osteophytes, and joint space narrowing by imaging studies. This condition presents safety issues increasing the risk of falls.  There is no current active infection.  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)    Bilateral pulmonary embolism (HCC)    Pneumonia due to COVID-19 virus 04/21/2019   Neck pain 02/17/2016   Past Medical History:  Diagnosis Date   Anxiety    Arthritis    Asthma    Complication of anesthesia    COVID-19    Depression    Diabetes mellitus without complication (HCC)    DVT (deep venous thrombosis) (HCC)    Dysrhythmia    palpitations ->20 yrs stress test neg nothing since   GERD  (gastroesophageal reflux disease)    Headache    Pneumonia    PONV (postoperative nausea and vomiting)     Past Surgical History:  Procedure Laterality Date   ABDOMINAL HYSTERECTOMY     ANTERIOR CERVICAL DECOMP/DISCECTOMY FUSION N/A 02/17/2016   Procedure: ANTERIOR CERVICAL DECOMPRESSION/DISCECTOMY FUSION 1 LEVEL C4-5;  Surgeon: Donaciano Sprang, MD;  Location[facility];  Service: Orthopedics;  Laterality: N/A;   EYE SURGERY Bilateral    cataracts   FINGER SURGERY Left    thumb tendon surgery   HIP CLOSED REDUCTION Right 12/24/2012   Procedure: CLOSED REDUCTION HIP;  Surgeon: Oneil Rodgers Priestly, MD;  Location[facility];  Service: Orthopedics;  Laterality: Right;   IR IVC FILTER PLMT / S&I /IMG GUID/MOD SED  04/29/2019   IR IVC FILTER RETRIEVAL / S&I /IMG GUID/MOD SED  01/21/2020   IR PTA VENOUS EXCEPT DIALYSIS CIRCUIT  05/16/2019   IR RADIOLOGIST EVAL & MGMT  06/18/2019   IR RADIOLOGIST EVAL & MGMT  01/07/2020   IR THROMBECT SEC MECH MOD SED  05/16/2019   IR THROMBECT VENO MECH MOD SED  05/16/2019   IR US  GUIDE VASC ACCESS LEFT  05/16/2019   IR US  GUIDE VASC ACCESS RIGHT  05/16/2019   IR US  GUIDE VASC ACCESS RIGHT  05/16/2019   IR VENO/EXT/BI  05/16/2019   IR VENOCAVAGRAM IVC  05/16/2019   IVC FILTER INSERTION N/A 02/12/2024   Procedure: IVC FILTER INSERTION;  Surgeon: [name] Penne Bruckner, MD;  Location: [facility] INVASIVE CV LAB;  Service: Cardiovascular;  Laterality: N/A;  JOINT REPLACEMENT  2002   right and left knees   SHOULDER ARTHROSCOPY Left    TOTAL HIP ARTHROPLASTY Right    2002    Current Outpatient Medications  Medication Sig Dispense Refill Last Dose/Taking   acetaminophen  (TYLENOL ) 500 MG tablet Take 1,000 mg by mouth every 6 (six) hours as needed for mild pain or moderate pain.       albuterol  (VENTOLIN  HFA) 108 (90 Base) MCG/ACT inhaler Inhale 2 puffs into the lungs every 4 (four) hours as needed for wheezing or shortness of breath. 8 g 0    cetirizine (ZYRTEC) 10 MG  tablet Take 10 mg by mouth daily.      cholecalciferol  (VITAMIN D ) 1000 UNITS tablet Take 1,000 Units by mouth daily.      dapagliflozin propanediol (FARXIGA) 10 MG TABS tablet Take 10 mg by mouth daily.      diclofenac  Sodium (VOLTAREN ) 1 % GEL Apply 2 g topically daily as needed (for pain).       metFORMIN  (GLUCOPHAGE ) 500 MG tablet Take 500 mg by mouth daily.      omeprazole (PRILOSEC) 20 MG capsule Take 20 mg by mouth daily.      sertraline (ZOLOFT) 25 MG tablet Take 25 mg by mouth daily.      No current facility-administered medications for this visit.   No Known Allergies  Social History   Tobacco Use   Smoking status: Never   Smokeless tobacco: Never  Substance Use Topics   Alcohol use: No    No family history on file.   Review of Systems  Musculoskeletal:  Positive for arthralgias and gait problem.  All other systems reviewed and are negative.   Objective:  Physical Exam Constitutional:      Appearance: Normal appearance.  HENT:     Head: Normocephalic and atraumatic.     Nose: Nose normal.     Mouth/Throat:     Mouth: Mucous membranes are moist.     Pharynx: Oropharynx is clear.  Cardiovascular:     Rate and Rhythm: Normal rate and regular rhythm.     Pulses: Normal pulses.     Heart sounds: Normal heart sounds.  Pulmonary:     Effort: Pulmonary effort is normal.     Breath sounds: Normal breath sounds.  Abdominal:     General: Abdomen is flat.     Palpations: Abdomen is soft.  Genitourinary:    Comments: Deferred. Musculoskeletal:     Cervical back: Normal range of motion and neck supple.     Comments: Examination of the left hip reveals no skin wounds or lesions. Pannicular fold has erythema and moisture, with nystatin  powder. Mild trochanteric tenderness to palpation. She has restricted range of motion of the hip. Pain with terminal flexion and rotation. Pain in the position of impingement. Positive Stinchfield.  Neurovascular intact  distally.  Ambulates with an antalgic gait.  Skin:    General: Skin is warm and dry.     Capillary Refill: Capillary refill takes less than 2 seconds.  Neurological:     General: No focal deficit present.     Mental Status: She is alert and oriented to person, place, and time.  Psychiatric:        Mood and Affect: Mood normal.        Behavior: Behavior normal.        Thought Content: Thought content normal.        Judgment: Judgment normal.     Vital signs  in last 24 hours: @VSRANGES @  Labs:   Estimated body mass index is 38.71 kg/m as calculated from the following:   Height as of 02/16/24: 5' 3.5 (1.613 m).   Weight as of 02/16/24: 100.7 kg.   Imaging Review Plain radiographs demonstrate severe degenerative joint disease of the left hip(s). The bone quality appears to be adequate for age and reported activity level.      Assessment/Plan:  End stage arthritis, left hip(s)  The patient history, physical examination, clinical judgement of the provider and imaging studies are consistent with end stage degenerative joint disease of the left hip(s) and total hip arthroplasty is deemed medically necessary. The treatment options including medical management, injection therapy, arthroscopy and arthroplasty were discussed at length. The risks and benefits of total hip arthroplasty were presented and reviewed. The risks due to aseptic loosening, infection, stiffness, dislocation/subluxation,  thromboembolic complications and other imponderables were discussed.  The patient acknowledged the explanation, agreed to proceed with the plan and consent was signed. Patient is being admitted for inpatient treatment for surgery, pain control, PT, OT, prophylactic antibiotics, VTE prophylaxis, progressive ambulation and ADL's and discharge planning.The patient is planning to be discharged home with HEP after an overnight stay.    Therapy Plans: HEP.  Disposition: Home with daughter and  husband Planned DVT Prophylaxis: Eliquis 2.5mg  BID DME needed: Has rolling walker. Has shower chair and bedside commode.  PCP: Cleared.  TXA: IV Allergies: NDKA.  Anesthesia Concerns: None.  BMI: 39.9. Last HgbA1c: 6.4.  Other: - T2DM, farxiga, metformin .  - History of DVT/PE, History of bilateral lower extremity thrombectomy.  - Pannicular erythema and moisture, she has nystatin  powder. Also under breasts. Discussed needs to be clear for surgery.  - IVC filter with Dr. [name] 02/12/24.  - Hydrocodone , zofran .  - 02/16/24: Hgb 13.0, K+ 4.8, Cr. 0.84.    Patient's anticipated LOS is less than 2 midnights, meeting these requirements: - Younger than 90 - Lives within 1 hour of care - Has a competent adult at home to recover with post-op recover - NO history of  - Chronic pain requiring opiods  - Diabetes  - Coronary Artery Disease  - Heart failure  - Heart attack  - Stroke  - DVT/VTE  - Cardiac arrhythmia  - Respiratory Failure/COPD  - Renal failure  - Anemia  - Advanced Liver disease

## 2024-02-20 NOTE — H&P (Signed)
 TOTAL HIP ADMISSION H&P  Patient is admitted for left total hip arthroplasty.  Subjective:  Chief Complaint: left hip pain  HPI: Regina [facility], 73 y.o. female, has a history of pain and functional disability in the left hip(s) due to arthritis and patient has failed non-surgical conservative treatments for greater than 12 weeks to include NSAID's and/or analgesics, flexibility and strengthening excercises, use of assistive devices, and activity modification.  Onset of symptoms was gradual starting 10 years ago with rapidlly worsening course since that time.The patient noted no past surgery on the left hip(s).  Patient currently rates pain in the left hip at 10 out of 10 with activity. Patient has night pain, worsening of pain with activity and weight bearing, trendelenberg gait, pain that interfers with activities of daily living, and pain with passive range of motion. Patient has evidence of subchondral cysts, subchondral sclerosis, periarticular osteophytes, and joint space narrowing by imaging studies. This condition presents safety issues increasing the risk of falls.  There is no current active infection.  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)    Bilateral pulmonary embolism (HCC)    Pneumonia due to COVID-19 virus 04/21/2019   Neck pain 02/17/2016   Past Medical History:  Diagnosis Date   Anxiety    Arthritis    Asthma    Complication of anesthesia    COVID-19    Depression    Diabetes mellitus without complication (HCC)    DVT (deep venous thrombosis) (HCC)    Dysrhythmia    palpitations ->20 yrs stress test neg nothing since   GERD  (gastroesophageal reflux disease)    Headache    Pneumonia    PONV (postoperative nausea and vomiting)     Past Surgical History:  Procedure Laterality Date   ABDOMINAL HYSTERECTOMY     ANTERIOR CERVICAL DECOMP/DISCECTOMY FUSION N/A 02/17/2016   Procedure: ANTERIOR CERVICAL DECOMPRESSION/DISCECTOMY FUSION 1 LEVEL C4-5;  Surgeon: Donaciano Sprang, MD;  Location: [facility];  Service: Orthopedics;  Laterality: N/A;   EYE SURGERY Bilateral    cataracts   FINGER SURGERY Left    thumb tendon surgery   HIP CLOSED REDUCTION Right 12/24/2012   Procedure: CLOSED REDUCTION HIP;  Surgeon: Oneil Rodgers Priestly, MD;  Location: [facility];  Service: Orthopedics;  Laterality: Right;   IR IVC FILTER PLMT / S&I /IMG GUID/MOD SED  04/29/2019   IR IVC FILTER RETRIEVAL / S&I /IMG GUID/MOD SED  01/21/2020   IR PTA VENOUS EXCEPT DIALYSIS CIRCUIT  05/16/2019   IR RADIOLOGIST EVAL & MGMT  06/18/2019   IR RADIOLOGIST EVAL & MGMT  01/07/2020   IR THROMBECT SEC MECH MOD SED  05/16/2019   IR THROMBECT VENO MECH MOD SED  05/16/2019   IR US  GUIDE VASC ACCESS LEFT  05/16/2019   IR US  GUIDE VASC ACCESS RIGHT  05/16/2019   IR US  GUIDE VASC ACCESS RIGHT  05/16/2019   IR VENO/EXT/BI  05/16/2019   IR VENOCAVAGRAM IVC  05/16/2019   IVC FILTER INSERTION N/A 02/12/2024   Procedure: IVC FILTER INSERTION;  Surgeon: [name] Penne Bruckner, MD;  Location: [facility] INVASIVE CV LAB;  Service: Cardiovascular;  Laterality: N/A;  JOINT REPLACEMENT  2002   right and left knees   SHOULDER ARTHROSCOPY Left    TOTAL HIP ARTHROPLASTY Right    2002    Current Outpatient Medications  Medication Sig Dispense Refill Last Dose/Taking   acetaminophen  (TYLENOL ) 500 MG tablet Take 1,000 mg by mouth every 6 (six) hours as needed for mild pain or moderate pain.       albuterol  (VENTOLIN  HFA) 108 (90 Base) MCG/ACT inhaler Inhale 2 puffs into the lungs every 4 (four) hours as needed for wheezing or shortness of breath. 8 g 0    cetirizine (ZYRTEC) 10 MG  tablet Take 10 mg by mouth daily.      cholecalciferol  (VITAMIN D ) 1000 UNITS tablet Take 1,000 Units by mouth daily.      dapagliflozin propanediol (FARXIGA) 10 MG TABS tablet Take 10 mg by mouth daily.      diclofenac  Sodium (VOLTAREN ) 1 % GEL Apply 2 g topically daily as needed (for pain).       metFORMIN  (GLUCOPHAGE ) 500 MG tablet Take 500 mg by mouth daily.      omeprazole (PRILOSEC) 20 MG capsule Take 20 mg by mouth daily.      sertraline (ZOLOFT) 25 MG tablet Take 25 mg by mouth daily.      No current facility-administered medications for this visit.   No Known Allergies  Social History   Tobacco Use   Smoking status: Never   Smokeless tobacco: Never  Substance Use Topics   Alcohol use: No    No family history on file.   Review of Systems  Musculoskeletal:  Positive for arthralgias and gait problem.  All other systems reviewed and are negative.   Objective:  Physical Exam Constitutional:      Appearance: Normal appearance.  HENT:     Head: Normocephalic and atraumatic.     Nose: Nose normal.     Mouth/Throat:     Mouth: Mucous membranes are moist.     Pharynx: Oropharynx is clear.  Cardiovascular:     Rate and Rhythm: Normal rate and regular rhythm.     Pulses: Normal pulses.     Heart sounds: Normal heart sounds.  Pulmonary:     Effort: Pulmonary effort is normal.     Breath sounds: Normal breath sounds.  Abdominal:     General: Abdomen is flat.     Palpations: Abdomen is soft.  Genitourinary:    Comments: Deferred. Musculoskeletal:     Cervical back: Normal range of motion and neck supple.     Comments: Examination of the left hip reveals no skin wounds or lesions. Pannicular fold has erythema and moisture, with nystatin  powder. Mild trochanteric tenderness to palpation. She has restricted range of motion of the hip. Pain with terminal flexion and rotation. Pain in the position of impingement. Positive Stinchfield.  Neurovascular intact  distally.  Ambulates with an antalgic gait.  Skin:    General: Skin is warm and dry.     Capillary Refill: Capillary refill takes less than 2 seconds.  Neurological:     General: No focal deficit present.     Mental Status: She is alert and oriented to person, place, and time.  Psychiatric:        Mood and Affect: Mood normal.        Behavior: Behavior normal.        Thought Content: Thought content normal.        Judgment: Judgment normal.     Vital signs  in last 24 hours: @VSRANGES @  Labs:   Estimated body mass index is 38.71 kg/m as calculated from the following:   Height as of 02/16/24: 5' 3.5 (1.613 m).   Weight as of 02/16/24: 100.7 kg.   Imaging Review Plain radiographs demonstrate severe degenerative joint disease of the left hip(s). The bone quality appears to be adequate for age and reported activity level.      Assessment/Plan:  End stage arthritis, left hip(s)  The patient history, physical examination, clinical judgement of the provider and imaging studies are consistent with end stage degenerative joint disease of the left hip(s) and total hip arthroplasty is deemed medically necessary. The treatment options including medical management, injection therapy, arthroscopy and arthroplasty were discussed at length. The risks and benefits of total hip arthroplasty were presented and reviewed. The risks due to aseptic loosening, infection, stiffness, dislocation/subluxation,  thromboembolic complications and other imponderables were discussed.  The patient acknowledged the explanation, agreed to proceed with the plan and consent was signed. Patient is being admitted for inpatient treatment for surgery, pain control, PT, OT, prophylactic antibiotics, VTE prophylaxis, progressive ambulation and ADL's and discharge planning.The patient is planning to be discharged home with HEP after an overnight stay.    Therapy Plans: HEP.  Disposition: Home with daughter and  husband Planned DVT Prophylaxis: Eliquis 2.5mg  BID DME needed: Has rolling walker. Has shower chair and bedside commode.  PCP: Cleared.  TXA: IV Allergies: NDKA.  Anesthesia Concerns: None.  BMI: 39.9. Last HgbA1c: 6.4.  Other: - T2DM, farxiga, metformin .  - History of DVT/PE, History of bilateral lower extremity thrombectomy.  - Pannicular erythema and moisture, she has nystatin  powder. Also under breasts. Discussed needs to be clear for surgery.  - IVC filter with Dr. [name] 02/12/24.  - Hydrocodone , zofran .  - 02/16/24: Hgb 13.0, K+ 4.8, Cr. 0.84.    Patient's anticipated LOS is less than 2 midnights, meeting these requirements: - Younger than 90 - Lives within 1 hour of care - Has a competent adult at home to recover with post-op recover - NO history of  - Chronic pain requiring opiods  - Diabetes  - Coronary Artery Disease  - Heart failure  - Heart attack  - Stroke  - DVT/VTE  - Cardiac arrhythmia  - Respiratory Failure/COPD  - Renal failure  - Anemia  - Advanced Liver disease

## 2024-02-21 ENCOUNTER — Ambulatory Visit (HOSPITAL_COMMUNITY): Payer: Self-pay | Admitting: Anesthesiology

## 2024-02-21 ENCOUNTER — Ambulatory Visit (HOSPITAL_COMMUNITY)
Admission: RE | Admit: 2024-02-21 | Discharge: 2024-02-22 | Disposition: A | Discharge status: Home / Self Care | Source: Ambulatory Visit | Attending: Orthopedic Surgery | Admitting: Orthopedic Surgery

## 2024-02-21 ENCOUNTER — Encounter (HOSPITAL_COMMUNITY): Payer: Self-pay | Admitting: Orthopedic Surgery

## 2024-02-21 ENCOUNTER — Ambulatory Visit (HOSPITAL_COMMUNITY)

## 2024-02-21 ENCOUNTER — Other Ambulatory Visit: Payer: Self-pay

## 2024-02-21 ENCOUNTER — Encounter (HOSPITAL_COMMUNITY): Admission: RE | Disposition: A | Payer: Self-pay | Source: Ambulatory Visit | Attending: Orthopedic Surgery

## 2024-02-21 ENCOUNTER — Encounter (HOSPITAL_COMMUNITY): Payer: Self-pay | Admitting: Medical

## 2024-02-21 DIAGNOSIS — Z981 Arthrodesis status: Secondary | ICD-10-CM | POA: Diagnosis not present

## 2024-02-21 DIAGNOSIS — E119 Type 2 diabetes mellitus without complications: Secondary | ICD-10-CM

## 2024-02-21 DIAGNOSIS — M1612 Unilateral primary osteoarthritis, left hip: Secondary | ICD-10-CM | POA: Insufficient documentation

## 2024-02-21 DIAGNOSIS — I499 Cardiac arrhythmia, unspecified: Secondary | ICD-10-CM | POA: Diagnosis not present

## 2024-02-21 DIAGNOSIS — F32A Depression, unspecified: Secondary | ICD-10-CM | POA: Insufficient documentation

## 2024-02-21 DIAGNOSIS — J45909 Unspecified asthma, uncomplicated: Secondary | ICD-10-CM | POA: Insufficient documentation

## 2024-02-21 DIAGNOSIS — K219 Gastro-esophageal reflux disease without esophagitis: Secondary | ICD-10-CM | POA: Diagnosis not present

## 2024-02-21 DIAGNOSIS — F419 Anxiety disorder, unspecified: Secondary | ICD-10-CM | POA: Insufficient documentation

## 2024-02-21 DIAGNOSIS — Z86718 Personal history of other venous thrombosis and embolism: Secondary | ICD-10-CM | POA: Diagnosis not present

## 2024-02-21 DIAGNOSIS — Z96642 Presence of left artificial hip joint: Secondary | ICD-10-CM | POA: Diagnosis present

## 2024-02-21 DIAGNOSIS — Z86711 Personal history of pulmonary embolism: Secondary | ICD-10-CM | POA: Diagnosis not present

## 2024-02-21 DIAGNOSIS — F418 Other specified anxiety disorders: Secondary | ICD-10-CM

## 2024-02-21 DIAGNOSIS — Z7984 Long term (current) use of oral hypoglycemic drugs: Secondary | ICD-10-CM | POA: Insufficient documentation

## 2024-02-21 HISTORY — PX: TOTAL HIP ARTHROPLASTY: SHX124

## 2024-02-21 LAB — TYPE AND SCREEN
ABO/RH(D): O POS
Antibody Screen: NEGATIVE

## 2024-02-21 LAB — GLUCOSE, CAPILLARY
Glucose-Capillary: 150 mg/dL — ABNORMAL HIGH (ref 70–99)
Glucose-Capillary: 129 mg/dL — ABNORMAL HIGH (ref 70–99)
Glucose-Capillary: 160 mg/dL — ABNORMAL HIGH (ref 70–99)

## 2024-02-21 SURGERY — ARTHROPLASTY, HIP, TOTAL, ANTERIOR APPROACH
Anesthesia: Monitor Anesthesia Care | Site: Hip | Laterality: Left

## 2024-02-21 MED ORDER — CEFAZOLIN SODIUM-DEXTROSE 2-4 GM/100ML-% IV SOLN
2.0000 g | INTRAVENOUS | Status: AC
  Administered 2024-02-21: 2 g via INTRAVENOUS
  Filled 2024-02-21: qty 100

## 2024-02-21 MED ORDER — DOCUSATE SODIUM 100 MG PO CAPS
100.0000 mg | ORAL_CAPSULE | Freq: Two times a day (BID) | ORAL | Status: DC
Start: 2024-02-21 — End: 2024-02-22
  Administered 2024-02-21 – 2024-02-22 (×2): 100 mg via ORAL
  Filled 2024-02-21 (×2): qty 1

## 2024-02-21 MED ORDER — LORATADINE 10 MG PO TABS
10.0000 mg | ORAL_TABLET | Freq: Every day | ORAL | Status: DC
  Administered 2024-02-22: 10 mg via ORAL
  Filled 2024-02-21: qty 1

## 2024-02-21 MED ORDER — LACTATED RINGERS IV SOLN: INTRAVENOUS | Status: DC

## 2024-02-21 MED ORDER — PHENOL 1.4 % MT LIQD: 1.0000 | OROMUCOSAL | Status: DC | PRN

## 2024-02-21 MED ORDER — MORPHINE SULFATE (PF) 2 MG/ML IV SOLN: 0.5000 mg | INTRAVENOUS | Status: DC | PRN

## 2024-02-21 MED ORDER — KETOROLAC TROMETHAMINE 15 MG/ML IJ SOLN
7.5000 mg | Freq: Four times a day (QID) | INTRAMUSCULAR | Status: DC
  Administered 2024-02-21 – 2024-02-22 (×3): 7.5 mg via INTRAVENOUS
  Filled 2024-02-21 (×3): qty 1

## 2024-02-21 MED ORDER — CHLORHEXIDINE GLUCONATE 0.12 % MT SOLN
15.0000 mL | Freq: Once | OROMUCOSAL | Status: AC
  Administered 2024-02-21: 15 mL via OROMUCOSAL

## 2024-02-21 MED ORDER — ONDANSETRON HCL 4 MG/2ML IJ SOLN
4.0000 mg | Freq: Four times a day (QID) | INTRAMUSCULAR | Status: DC | PRN
  Administered 2024-02-22: 4 mg via INTRAVENOUS
  Filled 2024-02-21: qty 2

## 2024-02-21 MED ORDER — POVIDONE-IODINE 10 % EX SWAB: 2.0000 | Freq: Once | CUTANEOUS | Status: DC

## 2024-02-21 MED ORDER — FENTANYL CITRATE PF 50 MCG/ML IJ SOSY: 25.0000 ug | PREFILLED_SYRINGE | INTRAMUSCULAR | Status: DC | PRN

## 2024-02-21 MED ORDER — METHOCARBAMOL 500 MG PO TABS
ORAL_TABLET | ORAL | Status: AC
Start: 2024-02-21 — End: 2024-02-21
  Filled 2024-02-21: qty 1

## 2024-02-21 MED ORDER — TRANEXAMIC ACID-NACL 1000-0.7 MG/100ML-% IV SOLN
1000.0000 mg | INTRAVENOUS | Status: AC
Start: 2024-02-21 — End: 2024-02-21
  Administered 2024-02-21: 1000 mg via INTRAVENOUS
  Filled 2024-02-21: qty 100

## 2024-02-21 MED ORDER — ORAL CARE MOUTH RINSE: 15.0000 mL | Freq: Once | OROMUCOSAL | Status: AC

## 2024-02-21 MED ORDER — ACETAMINOPHEN 325 MG PO TABS: 325.0000 mg | ORAL_TABLET | Freq: Four times a day (QID) | ORAL | Status: DC | PRN

## 2024-02-21 MED ORDER — HYDROCODONE-ACETAMINOPHEN 7.5-325 MG PO TABS: 1.0000 | ORAL_TABLET | ORAL | Status: DC | PRN

## 2024-02-21 MED ORDER — SERTRALINE HCL 25 MG PO TABS
25.0000 mg | ORAL_TABLET | Freq: Every day | ORAL | Status: DC
Start: 2024-02-21 — End: 2024-02-22
  Administered 2024-02-21 – 2024-02-22 (×2): 25 mg via ORAL
  Filled 2024-02-21 (×2): qty 1

## 2024-02-21 MED ORDER — POLYETHYLENE GLYCOL 3350 17 G PO PACK: 17.0000 g | PACK | Freq: Every day | ORAL | Status: DC | PRN

## 2024-02-21 MED ORDER — METOCLOPRAMIDE HCL 5 MG PO TABS
5.0000 mg | ORAL_TABLET | Freq: Three times a day (TID) | ORAL | Status: DC | PRN
  Administered 2024-02-22: 5 mg via ORAL
  Filled 2024-02-21: qty 1

## 2024-02-21 MED ORDER — MUPIROCIN 2 % EX OINT: 1.0000 | TOPICAL_OINTMENT | Freq: Two times a day (BID) | CUTANEOUS | 0 refills | Status: AC

## 2024-02-21 MED ORDER — ALBUTEROL SULFATE HFA 108 (90 BASE) MCG/ACT IN AERS: 2.0000 | INHALATION_SPRAY | RESPIRATORY_TRACT | Status: DC | PRN

## 2024-02-21 MED ORDER — HYDROCODONE-ACETAMINOPHEN 5-325 MG PO TABS
1.0000 | ORAL_TABLET | ORAL | Status: DC | PRN
  Administered 2024-02-21 – 2024-02-22 (×2): 1 via ORAL
  Administered 2024-02-22: 2 via ORAL
  Filled 2024-02-21: qty 1
  Filled 2024-02-21: qty 2
  Filled 2024-02-21: qty 1

## 2024-02-21 MED ORDER — FENTANYL CITRATE (PF) 100 MCG/2ML IJ SOLN
INTRAMUSCULAR | Status: DC | PRN
  Administered 2024-02-21: 50 ug via INTRAVENOUS

## 2024-02-21 MED ORDER — ALUM & MAG HYDROXIDE-SIMETH 200-200-20 MG/5ML PO SUSP: 30.0000 mL | ORAL | Status: DC | PRN

## 2024-02-21 MED ORDER — VITAMIN D 25 MCG (1000 UNIT) PO TABS
1000.0000 [IU] | ORAL_TABLET | Freq: Every day | ORAL | Status: DC
  Administered 2024-02-21 – 2024-02-22 (×2): 1000 [IU] via ORAL
  Filled 2024-02-21 (×2): qty 1

## 2024-02-21 MED ORDER — NYSTATIN 100000 UNIT/GM EX POWD
Freq: Three times a day (TID) | CUTANEOUS | Status: DC
  Filled 2024-02-21: qty 15

## 2024-02-21 MED ORDER — ONDANSETRON HCL 4 MG/2ML IJ SOLN
INTRAMUSCULAR | Status: DC | PRN
  Administered 2024-02-21: 4 mg via INTRAVENOUS

## 2024-02-21 MED ORDER — ACETAMINOPHEN 500 MG PO TABS
1000.0000 mg | ORAL_TABLET | Freq: Once | ORAL | Status: AC
  Administered 2024-02-21: 1000 mg via ORAL
  Filled 2024-02-21: qty 2

## 2024-02-21 MED ORDER — INSULIN ASPART 100 UNIT/ML IJ SOLN
0.0000 [IU] | Freq: Three times a day (TID) | INTRAMUSCULAR | Status: DC
  Administered 2024-02-22: 3 [IU] via SUBCUTANEOUS
  Administered 2024-02-22: 1 [IU] via SUBCUTANEOUS

## 2024-02-21 MED ORDER — AMISULPRIDE (ANTIEMETIC) 5 MG/2ML IV SOLN: 10.0000 mg | Freq: Once | INTRAVENOUS | Status: DC | PRN

## 2024-02-21 MED ORDER — CHLORHEXIDINE GLUCONATE 4 % EX SOLN: 1.0000 | CUTANEOUS | 1 refills | Status: AC

## 2024-02-21 MED ORDER — SODIUM CHLORIDE 0.9 % IV SOLN
INTRAVENOUS | Status: DC
Start: 2024-02-21 — End: 2024-02-22

## 2024-02-21 MED ORDER — BUPIVACAINE IN DEXTROSE 0.75-8.25 % IT SOLN
INTRATHECAL | Status: DC | PRN
  Administered 2024-02-21: 1.8 mL via INTRATHECAL

## 2024-02-21 MED ORDER — OXYCODONE HCL 5 MG PO TABS
ORAL_TABLET | ORAL | Status: AC
  Filled 2024-02-21: qty 1

## 2024-02-21 MED ORDER — INSULIN ASPART 100 UNIT/ML IJ SOLN: 0.0000 [IU] | INTRAMUSCULAR | Status: DC | PRN

## 2024-02-21 MED ORDER — KETOROLAC TROMETHAMINE 30 MG/ML IJ SOLN
INTRAMUSCULAR | Status: AC
  Filled 2024-02-21: qty 1

## 2024-02-21 MED ORDER — PANTOPRAZOLE SODIUM 40 MG PO TBEC: 40.0000 mg | DELAYED_RELEASE_TABLET | Freq: Every day | ORAL | Status: DC

## 2024-02-21 MED ORDER — SODIUM CHLORIDE (PF) 0.9 % IJ SOLN
INTRAMUSCULAR | Status: AC
  Filled 2024-02-21: qty 30

## 2024-02-21 MED ORDER — APIXABAN 2.5 MG PO TABS
2.5000 mg | ORAL_TABLET | Freq: Two times a day (BID) | ORAL | Status: DC
  Administered 2024-02-22: 2.5 mg via ORAL
  Filled 2024-02-21: qty 1

## 2024-02-21 MED ORDER — PROPOFOL 1000 MG/100ML IV EMUL
INTRAVENOUS | Status: AC
  Filled 2024-02-21: qty 100

## 2024-02-21 MED ORDER — OXYCODONE HCL 5 MG PO TABS
5.0000 mg | ORAL_TABLET | Freq: Once | ORAL | Status: AC | PRN
  Administered 2024-02-21: 5 mg via ORAL

## 2024-02-21 MED ORDER — POVIDONE-IODINE 10 % EX SWAB
2.0000 | Freq: Once | CUTANEOUS | Status: AC
  Administered 2024-02-21: 2 via TOPICAL

## 2024-02-21 MED ORDER — METOCLOPRAMIDE HCL 5 MG/ML IJ SOLN: 5.0000 mg | Freq: Three times a day (TID) | INTRAMUSCULAR | Status: DC | PRN

## 2024-02-21 MED ORDER — ALBUTEROL SULFATE (2.5 MG/3ML) 0.083% IN NEBU: 2.5000 mg | INHALATION_SOLUTION | RESPIRATORY_TRACT | Status: DC | PRN

## 2024-02-21 MED ORDER — ONDANSETRON HCL 4 MG PO TABS: 4.0000 mg | ORAL_TABLET | Freq: Four times a day (QID) | ORAL | Status: DC | PRN

## 2024-02-21 MED ORDER — CEFAZOLIN SODIUM-DEXTROSE 2-4 GM/100ML-% IV SOLN
2.0000 g | Freq: Four times a day (QID) | INTRAVENOUS | Status: AC
  Administered 2024-02-21 – 2024-02-22 (×2): 2 g via INTRAVENOUS
  Filled 2024-02-21 (×2): qty 100

## 2024-02-21 MED ORDER — ISOPROPYL ALCOHOL 70 % SOLN
Status: DC | PRN
  Administered 2024-02-21: 1 via TOPICAL

## 2024-02-21 MED ORDER — MUPIROCIN 2 % EX OINT
TOPICAL_OINTMENT | Freq: Two times a day (BID) | CUTANEOUS | Status: DC
Start: 2024-02-21 — End: 2024-02-26
  Filled 2024-02-21: qty 22

## 2024-02-21 MED ORDER — INSULIN ASPART 100 UNIT/ML IJ SOLN: 0.0000 [IU] | Freq: Every day | INTRAMUSCULAR | Status: DC

## 2024-02-21 MED ORDER — FENTANYL CITRATE (PF) 100 MCG/2ML IJ SOLN
INTRAMUSCULAR | Status: AC
  Filled 2024-02-21: qty 2

## 2024-02-21 MED ORDER — DIPHENHYDRAMINE HCL 12.5 MG/5ML PO ELIX: 12.5000 mg | ORAL_SOLUTION | ORAL | Status: DC | PRN

## 2024-02-21 MED ORDER — SENNA 8.6 MG PO TABS
1.0000 | ORAL_TABLET | Freq: Two times a day (BID) | ORAL | Status: DC
Start: 2024-02-21 — End: 2024-02-22
  Administered 2024-02-21 – 2024-02-22 (×2): 8.6 mg via ORAL
  Filled 2024-02-21 (×2): qty 1

## 2024-02-21 MED ORDER — WATER FOR IRRIGATION, STERILE IR SOLN
Status: DC | PRN
  Administered 2024-02-21: 2000 mL

## 2024-02-21 MED ORDER — METHOCARBAMOL 1000 MG/10ML IJ SOLN: 500.0000 mg | Freq: Four times a day (QID) | INTRAMUSCULAR | Status: DC | PRN

## 2024-02-21 MED ORDER — PANTOPRAZOLE SODIUM 40 MG PO TBEC
40.0000 mg | DELAYED_RELEASE_TABLET | Freq: Every day | ORAL | Status: DC
  Administered 2024-02-22: 40 mg via ORAL
  Filled 2024-02-21: qty 1

## 2024-02-21 MED ORDER — MENTHOL 3 MG MT LOZG: 1.0000 | LOZENGE | OROMUCOSAL | Status: DC | PRN

## 2024-02-21 MED ORDER — SODIUM CHLORIDE 0.9 % IR SOLN
Status: DC | PRN
  Administered 2024-02-21: 1000 mL

## 2024-02-21 MED ORDER — BUPIVACAINE-EPINEPHRINE (PF) 0.25% -1:200000 IJ SOLN
INTRAMUSCULAR | Status: AC
  Filled 2024-02-21: qty 30

## 2024-02-21 MED ORDER — PHENYLEPHRINE HCL-NACL 20-0.9 MG/250ML-% IV SOLN
INTRAVENOUS | Status: DC | PRN
  Administered 2024-02-21: 25 ug/min via INTRAVENOUS

## 2024-02-21 MED ORDER — OXYCODONE HCL 5 MG/5ML PO SOLN: 5.0000 mg | Freq: Once | ORAL | Status: AC | PRN

## 2024-02-21 MED ORDER — SODIUM CHLORIDE (PF) 0.9 % IJ SOLN
INTRAMUSCULAR | Status: DC | PRN
  Administered 2024-02-21: 61 mL

## 2024-02-21 MED ORDER — PROPOFOL 10 MG/ML IV BOLUS
INTRAVENOUS | Status: DC | PRN
  Administered 2024-02-21 (×2): 10 mg via INTRAVENOUS

## 2024-02-21 MED ORDER — PROPOFOL 500 MG/50ML IV EMUL
INTRAVENOUS | Status: DC | PRN
  Administered 2024-02-21: 50 ug/kg/min via INTRAVENOUS

## 2024-02-21 MED ORDER — BISACODYL 10 MG RE SUPP: 10.0000 mg | Freq: Every day | RECTAL | Status: DC | PRN

## 2024-02-21 MED ORDER — METHOCARBAMOL 500 MG PO TABS
500.0000 mg | ORAL_TABLET | Freq: Four times a day (QID) | ORAL | Status: DC | PRN
Start: 2024-02-21 — End: 2024-02-22
  Administered 2024-02-21: 500 mg via ORAL

## 2024-02-21 SURGICAL SUPPLY — 47 items
BAG COUNTER SPONGE SURGICOUNT (BAG) IMPLANT
BAG ZIPLOCK 12X15 (MISCELLANEOUS) ×1 IMPLANT
BLADE SAW RECIPROCATING 77.5 (BLADE) IMPLANT
BLADE SAW SGTL 18X104X1.24 (BLADE) ×1 IMPLANT
CHLORAPREP W/TINT 26 (MISCELLANEOUS) ×1 IMPLANT
COVER PERINEAL POST (MISCELLANEOUS) ×1 IMPLANT
COVER SURGICAL LIGHT HANDLE (MISCELLANEOUS) ×1 IMPLANT
DERMABOND ADVANCED .7 DNX12 (GAUZE/BANDAGES/DRESSINGS) ×1 IMPLANT
DRAPE IMP U-DRAPE 54X76 (DRAPES) ×1 IMPLANT
DRAPE SHEET LG 3/4 BI-LAMINATE (DRAPES) ×3 IMPLANT
DRAPE STERI IOBAN 125X83 (DRAPES) ×1 IMPLANT
DRAPE U-SHAPE 47X51 STRL (DRAPES) ×2 IMPLANT
DRSG AQUACEL AG ADV 3.5X10 (GAUZE/BANDAGES/DRESSINGS) ×1 IMPLANT
ELECT REM PT RETURN 15FT ADLT (MISCELLANEOUS) ×1 IMPLANT
GAUZE SPONGE 4X4 12PLY STRL (GAUZE/BANDAGES/DRESSINGS) ×1 IMPLANT
GLOVE BIO SURGEON STRL SZ7 (GLOVE) ×1 IMPLANT
GLOVE BIO SURGEON STRL SZ8.5 (GLOVE) ×2 IMPLANT
GLOVE BIOGEL PI IND STRL 7.5 (GLOVE) ×1 IMPLANT
GLOVE BIOGEL PI IND STRL 8.5 (GLOVE) ×1 IMPLANT
GOWN SPEC L3 XXLG W/TWL (GOWN DISPOSABLE) ×1 IMPLANT
GOWN STRL REUS W/ TWL XL LVL3 (GOWN DISPOSABLE) ×1 IMPLANT
HEAD CERAMIC BIOLOX 36 T1 +3 (Head) IMPLANT
HOLDER FOLEY CATH W/STRAP (MISCELLANEOUS) ×1 IMPLANT
HOOD PEEL AWAY T7 (MISCELLANEOUS) ×3 IMPLANT
KIT TURNOVER KIT A (KITS) ×1 IMPLANT
LINER ACETAB NN G7 F 36 (Liner) IMPLANT
MANIFOLD NEPTUNE II (INSTRUMENTS) ×1 IMPLANT
MARKER SKIN DUAL TIP RULER LAB (MISCELLANEOUS) ×1 IMPLANT
NDL SAFETY ECLIPSE 18X1.5 (NEEDLE) ×1 IMPLANT
NDL SPNL 18GX3.5 QUINCKE PK (NEEDLE) ×1 IMPLANT
NEEDLE SPNL 18GX3.5 QUINCKE PK (NEEDLE) ×1 IMPLANT
PACK ANTERIOR HIP CUSTOM (KITS) ×1 IMPLANT
PENCIL SMOKE EVACUATOR (MISCELLANEOUS) ×1 IMPLANT
SEALER BIPOLAR AQUA 6.0 (INSTRUMENTS) ×1 IMPLANT
SET HNDPC FAN SPRY TIP SCT (DISPOSABLE) ×1 IMPLANT
SHELL ACET G7 4H 56 SZF (Shell) IMPLANT
SOLUTION PRONTOSAN WOUND 350ML (IRRIGATION / IRRIGATOR) ×1 IMPLANT
SPIKE FLUID TRANSFER (MISCELLANEOUS) ×1 IMPLANT
STEM FEM CMTLS 16X152 133D (Stem) IMPLANT
SUT MNCRL AB 3-0 PS2 18 (SUTURE) ×1 IMPLANT
SUT MON AB 2-0 CT1 36 (SUTURE) ×1 IMPLANT
SUT STRATAFIX 14 PDO 48 VLT (SUTURE) ×1 IMPLANT
SUT VIC AB 2-0 CT1 TAPERPNT 27 (SUTURE) IMPLANT
SYR 3ML LL SCALE MARK (SYRINGE) ×1 IMPLANT
TOWEL GREEN STERILE FF (TOWEL DISPOSABLE) ×1 IMPLANT
TRAY FOLEY MTR SLVR 16FR STAT (SET/KITS/TRAYS/PACK) IMPLANT
TUBE SUCTION HIGH CAP CLEAR NV (SUCTIONS) ×1 IMPLANT

## 2024-02-21 NOTE — Discharge Instructions (Signed)

## 2024-02-21 NOTE — Anesthesia Procedure Notes (Signed)
 Spinal  Patient location during procedure: OR Start time: 02/21/2024 2:57 PM End time: 02/21/2024 3:02 PM Reason for block: surgical anesthesia Staffing Performed: anesthesiologist  Anesthesiologist: Peggye Delon Brunswick, MD Performed by: Peggye Delon Brunswick, MD Authorized by: Peggye Delon Brunswick, MD   Preanesthetic Checklist Completed: patient identified, IV checked, site marked, risks and benefits discussed, surgical consent, monitors and equipment checked, pre-op evaluation and timeout performed Spinal Block Patient position: sitting Prep: DuraPrep Patient monitoring: blood pressure and continuous pulse ox Approach: midline Location: L3-4 Injection technique: single-shot Needle Needle type: Pencan  Needle gauge: 24 G Needle length: 9 cm Additional Notes Risks and benefits of neuraxial anesthesia including, but not limited to, infection, bleeding, local anesthetic toxicity, headache, hypotension, back pain, block failure, etc. were discussed with the patient. The patient expressed understanding and consented to the procedure. I confirmed that the patient has no bleeding disorders and is not taking blood thinners. I confirmed the patient's last platelet count with the nurse. Monitors were applied. A time-out was performed immediately prior to the procedure. Sterile technique was used throughout the whole procedure.   2 attempt(s)

## 2024-02-21 NOTE — Anesthesia Postprocedure Evaluation (Signed)
 Anesthesia Post Note  Patient: Regina Munoz  Procedure(s) Performed: ARTHROPLASTY, HIP, TOTAL, ANTERIOR APPROACH (Left: Hip)     Patient location during evaluation: PACU Anesthesia Type: MAC and Spinal Level of consciousness: oriented and awake and alert Pain management: pain level controlled Vital Signs Assessment: post-procedure vital signs reviewed and stable Respiratory status: spontaneous breathing, respiratory function stable and patient connected to nasal cannula oxygen  Cardiovascular status: blood pressure returned to baseline and stable Postop Assessment: no headache, no backache and no apparent nausea or vomiting Anesthetic complications: no   No notable events documented.  Last Vitals:  Vitals:   02/21/24 1845 02/21/24 1908  BP: (!) 117/38 (!) 108/92  Pulse: (!) 54 62  Resp: 12 16  Temp:  (!) 36.4 C  SpO2: 100% 98%    Last Pain:  Vitals:   02/21/24 1908  TempSrc: Oral  PainSc:                  Kacper Cartlidge

## 2024-02-21 NOTE — Op Note (Signed)
 OPERATIVE REPORT  SURGEON: Redell Shoals, MD   ASSISTANT: Valery Potters, PA-C.  PREOPERATIVE DIAGNOSIS: Left hip arthritis.   POSTOPERATIVE DIAGNOSIS: Left hip arthritis.   PROCEDURE: Left total hip arthroplasty, anterior approach.   IMPLANTS: Biomet Taperloc Reduced Distal stem, size 16 x 182 mm, high offset. Biomet G7 OsseoTi Cup, size 56 mm. Biomet Vivacit-E liner, size 36 mm, F, neutral. Biomet Biolox ceramic head ball, size 36 + 3 mm.  ANESTHESIA:  MAC and Spinal  ESTIMATED BLOOD LOSS:-150 mL    ANTIBIOTICS: 2g Ancef .  DRAINS: None.  COMPLICATIONS: None.   CONDITION: PACU - hemodynamically stable.   BRIEF CLINICAL NOTE: Regina Munoz is a 73 y.o. female with a long-standing history of Left hip arthritis. After failing conservative management, the patient was indicated for total hip arthroplasty. The risks, benefits, and alternatives to the procedure were explained, and the patient elected to proceed.  PROCEDURE IN DETAIL: Surgical site was marked by myself in the pre-op holding area. Once inside the operating room, spinal anesthesia was obtained, and a foley catheter was inserted. The patient was then positioned on the Hana table.  All bony prominences were well padded.  The hip was prepped and draped in the normal sterile surgical fashion.  A time-out was called verifying side and site of surgery. The patient received IV antibiotics within 60 minutes of beginning the procedure.   Bikini incision was made, and superficial dissection was performed lateral to the ASIS. The direct anterior approach to the hip was performed through the Hueter interval.  Lateral femoral circumflex vessels were treated with the Auqumantys. The anterior capsule was exposed and an inverted T capsulotomy was made. The femoral neck cut was made to the level of the templated cut.  A corkscrew was placed into the head and the head was removed.  The femoral head was found to have eburnated bone.  The head was passed to the back table and was measured. Pubofemoral ligament was released off of the calcar, taking care to stay on bone. Superior capsule was released from the greater trochanter, taking care to stay lateral to the posterior border of the femoral neck in order to preserve the short external rotators.   Acetabular exposure was achieved, and the pulvinar and labrum were excised. Sequential reaming of the acetabulum was then performed up to a size 55 mm reamer. A 56 mm cup was then opened and impacted into place at approximately 40 degrees of abduction and 20 degrees of anteversion. The final polyethylene liner was impacted into place and acetabular osteophytes were removed.    I then gained femoral exposure taking care to protect the abductors and greater trochanter.  This was performed using standard external rotation, extension, and adduction.  A cookie cutter was used to enter the femoral canal, and then the femoral canal finder was placed.  Sequential broaching was performed up to a size 16.  Calcar planer was used on the femoral neck remnant.  I placed a high offset neck and a trial head ball.  The hip was reduced.  Leg lengths and offset were checked fluoroscopically.  The hip was dislocated and trial components were removed.  The final implants were placed, and the hip was reduced.  Fluoroscopy was used to confirm component position and leg lengths.  At 90 degrees of external rotation and full extension, the hip was stable to an anterior directed force.   The wound was copiously irrigated with Prontosan solution and normal saline using pulse lavage.  Marcaine  solution was injected into the periarticular soft tissue.  The wound was closed in layers using #1 Stratafix for the fascia, 2-0 Vicryl for the subcutaneous fat, 2-0 Monocryl for the deep dermal layer, and staples Dermabond for the skin.  Once the glue was fully dried, an Aquacell Ag dressing was applied.  The patient was transported  to the recovery room in stable condition.  Sponge, needle, and instrument counts were correct at the end of the case x2.  The patient tolerated the procedure well and there were no known complications.  Please note that a surgical assistant was a medical necessity for this procedure to perform it in a safe and expeditious manner. Assistant was necessary to provide appropriate retraction of vital neurovascular structures, to prevent femoral fracture, and to allow for anatomic placement of the prosthesis.

## 2024-02-21 NOTE — Transfer of Care (Signed)
 Immediate Anesthesia Transfer of Care Note  Patient: Regina Munoz  Procedure(s) Performed: ARTHROPLASTY, HIP, TOTAL, ANTERIOR APPROACH (Left: Hip)  Patient Location: PACU  Anesthesia Type:MAC and Spinal  Level of Consciousness: awake and patient cooperative  Airway & Oxygen  Therapy: Patient Spontanous Breathing and Patient connected to face mask oxygen   Post-op Assessment: Report given to RN and Post -op Vital signs reviewed and stable  Post vital signs: Reviewed and stable  Last Vitals:  Vitals Value Taken Time  BP 110/55 02/21/24 17:30  Temp    Pulse 71 02/21/24 17:31  Resp 20 02/21/24 17:31  SpO2 92 % 02/21/24 17:31  Vitals shown include unfiled device data.  Last Pain:  Vitals:   02/21/24 1248  TempSrc:   PainSc: 0-No pain         Complications: No notable events documented.

## 2024-02-21 NOTE — Interval H&P Note (Signed)
 History and Physical Interval Note:  02/21/2024 12:08 PM  Regina Munoz  has presented today for surgery, with the diagnosis of Left hip osteoarthritis.  The various methods of treatment have been discussed with the patient and family. After consideration of risks, benefits and other options for treatment, the patient has consented to  Procedure(s): ARTHROPLASTY, HIP, TOTAL, ANTERIOR APPROACH (Left) as a surgical intervention.  The patient's history has been reviewed, patient examined, no change in status, stable for surgery.  I have reviewed the patient's chart and labs.  Questions were answered to the patient's satisfaction.     Redell PARAS Gonzalo Waymire

## 2024-02-22 ENCOUNTER — Encounter (HOSPITAL_COMMUNITY): Payer: Self-pay | Admitting: Orthopedic Surgery

## 2024-02-22 ENCOUNTER — Other Ambulatory Visit: Payer: Self-pay

## 2024-02-22 DIAGNOSIS — M1612 Unilateral primary osteoarthritis, left hip: Secondary | ICD-10-CM | POA: Diagnosis not present

## 2024-02-22 LAB — CBC
HCT: 34.1 % — ABNORMAL LOW (ref 36.0–46.0)
Hemoglobin: 10 g/dL — ABNORMAL LOW (ref 12.0–15.0)
MCH: 24.2 pg — ABNORMAL LOW (ref 26.0–34.0)
MCHC: 29.3 g/dL — ABNORMAL LOW (ref 30.0–36.0)
MCV: 82.6 fL (ref 80.0–100.0)
Platelets: 131 K/uL — ABNORMAL LOW (ref 150–400)
RBC: 4.13 MIL/uL (ref 3.87–5.11)
RDW: 18.8 % — ABNORMAL HIGH (ref 11.5–15.5)
WBC: 11 K/uL — ABNORMAL HIGH (ref 4.0–10.5)
nRBC: 0 % (ref 0.0–0.2)

## 2024-02-22 LAB — BASIC METABOLIC PANEL WITH GFR
Anion gap: 7 (ref 5–15)
Anion gap: 9 (ref 5–15)
BUN: 18 mg/dL (ref 8–23)
BUN: 14 mg/dL (ref 8–23)
CO2: 24 mmol/L (ref 22–32)
CO2: 22 mmol/L (ref 22–32)
Calcium: 8.6 mg/dL — ABNORMAL LOW (ref 8.9–10.3)
Calcium: 8.6 mg/dL — ABNORMAL LOW (ref 8.9–10.3)
Chloride: 105 mmol/L (ref 98–111)
Chloride: 101 mmol/L (ref 98–111)
Creatinine, Ser: 1.1 mg/dL — ABNORMAL HIGH (ref 0.44–1.00)
Creatinine, Ser: 1.03 mg/dL — ABNORMAL HIGH (ref 0.44–1.00)
GFR, Estimated: 53 mL/min — ABNORMAL LOW (ref >60)
GFR, Estimated: 57 mL/min — ABNORMAL LOW (ref >60)
Glucose, Bld: 166 mg/dL — ABNORMAL HIGH (ref 70–99)
Glucose, Bld: 177 mg/dL — ABNORMAL HIGH (ref 70–99)
Potassium: 4.4 mmol/L (ref 3.5–5.1)
Potassium: 4.5 mmol/L (ref 3.5–5.1)
Sodium: 137 mmol/L (ref 135–145)
Sodium: 132 mmol/L — ABNORMAL LOW (ref 135–145)

## 2024-02-22 LAB — GLUCOSE, CAPILLARY
Glucose-Capillary: 129 mg/dL — ABNORMAL HIGH (ref 70–99)
Glucose-Capillary: 218 mg/dL — ABNORMAL HIGH (ref 70–99)
Glucose-Capillary: 180 mg/dL — ABNORMAL HIGH (ref 70–99)

## 2024-02-22 MED ORDER — NYSTATIN 100000 UNIT/GM EX POWD: 1.0000 | Freq: Three times a day (TID) | CUTANEOUS | 0 refills | Status: AC

## 2024-02-22 MED ORDER — ASPIRIN 81 MG PO CHEW: 81.0000 mg | CHEWABLE_TABLET | Freq: Two times a day (BID) | ORAL | 0 refills | Status: AC

## 2024-02-22 MED ORDER — SENNA 8.6 MG PO TABS: 2.0000 | ORAL_TABLET | Freq: Every day | ORAL | 0 refills | Status: AC

## 2024-02-22 MED ORDER — DOCUSATE SODIUM 100 MG PO CAPS: 100.0000 mg | ORAL_CAPSULE | Freq: Two times a day (BID) | ORAL | 0 refills | Status: AC

## 2024-02-22 MED ORDER — HYDROCODONE-ACETAMINOPHEN 5-325 MG PO TABS: 1.0000 | ORAL_TABLET | ORAL | 0 refills | Status: AC | PRN

## 2024-02-22 MED ORDER — POLYETHYLENE GLYCOL 3350 17 G PO PACK: 17.0000 g | PACK | Freq: Every day | ORAL | 0 refills | Status: AC | PRN

## 2024-02-22 MED ORDER — ONDANSETRON HCL 4 MG PO TABS: 4.0000 mg | ORAL_TABLET | Freq: Three times a day (TID) | ORAL | 0 refills | Status: AC | PRN

## 2024-02-22 NOTE — Progress Notes (Signed)
 Physical Therapy Treatment Patient Details Name: Regina Munoz MRN: [id number] DOB: 12/10/1950 Today's Date: 02/22/2024   History of Present Illness Pt s/p L THR and with hx of DM, Bil TKR, R THR, and cervical fusion    PT Comments  Pt continues very cooperative and progressing with mobility including increased stability and increasing activity tolerance.  However, pt with c/o increasing nausea during ambulation.  Pt reviewed written HEP, reviewed car transfers, negotiated step and ambulated increased distance in hall.  Pt hopeful for dc home this date but with concerns regarding ongoing nausea.   If plan is discharge home, recommend the following: A little help with walking and/or transfers;A little help with bathing/dressing/bathroom;Assistance with cooking/housework;Assist for transportation;Help with stairs or ramp for entrance   Can travel by private vehicle        Equipment Recommendations  None recommended by PT    Recommendations for Other Services       Precautions / Restrictions Precautions Precautions: Fall Restrictions Weight Bearing Restrictions Per Provider Order: Yes LLE Weight Bearing Per Provider Order: Weight bearing as tolerated     Mobility  Bed Mobility Overal bed mobility: Needs Assistance Bed Mobility: Supine to Sit     Supine to sit: Min assist     General bed mobility comments: Pt up in chair and returns to same    Transfers Overall transfer level: Needs assistance Equipment used: Rolling walker (2 wheels) Transfers: Sit to/from Stand Sit to Stand: Min assist, Contact guard assist           General transfer comment: cues for LE management and use of UEs to self assist    Ambulation/Gait Ambulation/Gait assistance: Contact guard assist, Supervision Gait Distance (Feet): 52 Feet Assistive device: Rolling walker (2 wheels) Gait Pattern/deviations: Step-to pattern, Decreased step length - right, Decreased step length - left, Shuffle,  Trunk flexed Gait velocity: decr     General Gait Details: Increased time with cues for sequence, posture and position from RW; distance ltd by c/o nausea   Stairs Stairs: Yes Stairs assistance: Min assist Stair Management: No rails, Step to pattern, Forwards, With walker Number of Stairs: 2 General stair comments: single step twice with RW and cues for sequence   Wheelchair Mobility     Tilt Bed    Modified Rankin (Stroke Patients Only)       Balance Overall balance assessment: Needs assistance Sitting-balance support: No upper extremity supported, Feet supported Sitting balance-Leahy Scale: Good     Standing balance support: Bilateral upper extremity supported Standing balance-Leahy Scale: Poor                              Communication Communication Communication: No apparent difficulties  Cognition Arousal: Alert Behavior During Therapy: WFL for tasks assessed/performed   PT - Cognitive impairments: No apparent impairments                         Following commands: Intact      Cueing Cueing Techniques: Verbal cues  Exercises Total Joint Exercises Ankle Circles/Pumps: AROM, Both, 15 reps, Supine Quad Sets: AROM, Both, 10 reps, Supine Heel Slides: AAROM, Left, 20 reps, Supine Hip ABduction/ADduction: AAROM, Left, 15 reps, Supine Long Arc Quad: AAROM, Left, 10 reps, Seated    General Comments        Pertinent Vitals/Pain Pain Assessment Pain Assessment: 0-10 Pain Score: 4  Pain Location: L hip Pain Descriptors /  Indicators: Aching, Sore Pain Intervention(s): Limited activity within patient's tolerance, Monitored during session, Premedicated before session, Ice applied    Home Living Family/patient expects to be discharged to:: Private residence Living Arrangements: Spouse/significant other Available Help at Discharge: Family;Available 24 hours/day Type of Home: House Home Access: Stairs to enter Entrance Stairs-Rails:  None Entrance Stairs-Number of Steps: 1   Home Layout: One level Home Equipment: Agricultural consultant (2 wheels)      Prior Function            PT Goals (current goals can now be found in the care plan section) Acute Rehab PT Goals Patient Stated Goal: Regain IND PT Goal Formulation: With patient Time For Goal Achievement: 02/29/24 Potential to Achieve Goals: Good Progress towards PT goals: Progressing toward goals    Frequency    7X/week      PT Plan      Co-evaluation              AM-PAC PT 6 Clicks Mobility   Outcome Measure  Help needed turning from your back to your side while in a flat bed without using bedrails?: A Little Help needed moving from lying on your back to sitting on the side of a flat bed without using bedrails?: A Little Help needed moving to and from a bed to a chair (including a wheelchair)?: A Little Help needed standing up from a chair using your arms (e.g., wheelchair or bedside chair)?: A Little Help needed to walk in hospital room?: A Little Help needed climbing 3-5 steps with a railing? : A Little 6 Click Score: 18    End of Session Equipment Utilized During Treatment: Gait belt Activity Tolerance: Patient tolerated treatment well;Other (comment) (nausea) Patient left: in chair Nurse Communication: Mobility status;Other (comment) (nausea) PT Visit Diagnosis: Difficulty in walking, not elsewhere classified (R26.2)     Time: 1344-1410 PT Time Calculation (min) (ACUTE ONLY): 26 min  Charges:    $Gait Training: 8-22 mins $Therapeutic Exercise: 8-22 mins $Therapeutic Activity: 8-22 mins PT General Charges $$ ACUTE PT VISIT: 1 Visit                     [facility] PT Acute Rehabilitation Services Office [phone]    Lino Wickliff 02/22/2024, 2:00 PM

## 2024-02-22 NOTE — Evaluation (Signed)
 Physical Therapy Evaluation Patient Details Name: Regina Munoz MRN: [id number] DOB: 01-31-1951 Today's Date: 02/22/2024  History of Present Illness  Pt s/p L THR and with hx of DM, Bil TKR, R THR, and cervical fusion  Clinical Impression  Pt s/p L THR and presents with decreased L LE strength/ROM and post op pain limiting functional mobility.  Pt very cooperative and should progress well to dc home with family assist.      If plan is discharge home, recommend the following: A little help with walking and/or transfers;A little help with bathing/dressing/bathroom;Assistance with cooking/housework;Assist for transportation;Help with stairs or ramp for entrance   Can travel by private vehicle        Equipment Recommendations None recommended by PT  Recommendations for Other Services       Functional Status Assessment Patient has had a recent decline in their functional status and demonstrates the ability to make significant improvements in function in a reasonable and predictable amount of time.     Precautions / Restrictions Precautions Precautions: Fall Restrictions Weight Bearing Restrictions Per Provider Order: Yes LLE Weight Bearing Per Provider Order: Weight bearing as tolerated      Mobility  Bed Mobility Overal bed mobility: Needs Assistance Bed Mobility: Supine to Sit     Supine to sit: Min assist     General bed mobility comments: Increased time with cues for LE management and use of UEs to self assist.  Physical assist to manage L LE and to bring trunk to upright    Transfers Overall transfer level: Needs assistance Equipment used: Rolling walker (2 wheels) Transfers: Sit to/from Stand Sit to Stand: Min assist, Contact guard assist           General transfer comment: cues for LE management and use of UEs to self assist    Ambulation/Gait Ambulation/Gait assistance: Min assist Gait Distance (Feet): 28 Feet Assistive device: Rolling walker (2  wheels) Gait Pattern/deviations: Step-to pattern, Decreased step length - right, Decreased step length - left, Shuffle, Trunk flexed Gait velocity: decr     General Gait Details: Increased time with cues for sequence, posture and position from RW; distance ltd by c/o nausea  Stairs            Wheelchair Mobility     Tilt Bed    Modified Rankin (Stroke Patients Only)       Balance Overall balance assessment: Needs assistance Sitting-balance support: No upper extremity supported, Feet supported Sitting balance-Leahy Scale: Good     Standing balance support: Bilateral upper extremity supported Standing balance-Leahy Scale: Poor                               Pertinent Vitals/Pain Pain Assessment Pain Assessment: 0-10 Pain Score: 4  Pain Location: L hip Pain Descriptors / Indicators: Aching, Sore Pain Intervention(s): Limited activity within patient's tolerance, Monitored during session, Premedicated before session, Ice applied    Home Living Family/patient expects to be discharged to:: Private residence Living Arrangements: Spouse/significant other Available Help at Discharge: Family;Available 24 hours/day Type of Home: House Home Access: Stairs to enter Entrance Stairs-Rails: None Entrance Stairs-Number of Steps: 1   Home Layout: One level Home Equipment: Agricultural consultant (2 wheels)      Prior Function Prior Level of Function : Independent/Modified Independent                     Extremity/Trunk Assessment  Upper Extremity Assessment Upper Extremity Assessment: Overall WFL for tasks assessed    Lower Extremity Assessment Lower Extremity Assessment: LLE deficits/detail LLE Deficits / Details: Strength at hip 2/5 with AAROM at hip to 90 flex and 15 abd    Cervical / Trunk Assessment Cervical / Trunk Assessment: Normal  Communication   Communication Communication: No apparent difficulties    Cognition Arousal: Alert Behavior  During Therapy: WFL for tasks assessed/performed   PT - Cognitive impairments: No apparent impairments                         Following commands: Intact       Cueing Cueing Techniques: Verbal cues     General Comments      Exercises Total Joint Exercises Ankle Circles/Pumps: AROM, Both, 15 reps, Supine Quad Sets: AROM, Both, 10 reps, Supine Heel Slides: AAROM, Left, 20 reps, Supine Hip ABduction/ADduction: AAROM, Left, 15 reps, Supine Long Arc Quad: AAROM, Left, 10 reps, Seated   Assessment/Plan    PT Assessment Patient needs continued PT services  PT Problem List Decreased strength;Decreased range of motion;Decreased activity tolerance;Decreased mobility;Decreased balance;Decreased knowledge of use of DME;Pain       PT Treatment Interventions DME instruction;Gait training;Stair training;Functional mobility training;Therapeutic activities;Therapeutic exercise;Patient/family education    PT Goals (Current goals can be found in the Care Plan section)  Acute Rehab PT Goals Patient Stated Goal: Regain IND PT Goal Formulation: With patient Time For Goal Achievement: 02/29/24 Potential to Achieve Goals: Good    Frequency 7X/week     Co-evaluation               AM-PAC PT 6 Clicks Mobility  Outcome Measure Help needed turning from your back to your side while in a flat bed without using bedrails?: A Little Help needed moving from lying on your back to sitting on the side of a flat bed without using bedrails?: A Little Help needed moving to and from a bed to a chair (including a wheelchair)?: A Little Help needed standing up from a chair using your arms (e.g., wheelchair or bedside chair)?: A Little Help needed to walk in hospital room?: A Little Help needed climbing 3-5 steps with a railing? : A Lot 6 Click Score: 17    End of Session Equipment Utilized During Treatment: Gait belt Activity Tolerance: Patient tolerated treatment well;Other (comment)  (nausea) Patient left: in chair Nurse Communication: Mobility status;Other (comment) (nausea) PT Visit Diagnosis: Difficulty in walking, not elsewhere classified (R26.2)    Time: 1010-1047 PT Time Calculation (min) (ACUTE ONLY): 37 min   Charges:   PT Evaluation $PT Eval Low Complexity: 1 Low PT Treatments $Therapeutic Exercise: 8-22 mins PT General Charges $$ ACUTE PT VISIT: 1 Visit         [facility] PT Acute Rehabilitation Services Office [phone]   Tessa Seaberry 02/22/2024, 1:42 PM

## 2024-02-22 NOTE — Progress Notes (Signed)
Patient discharged to home w/ dtr. Given all belongings, instructions, equipment. Verbalized understanding of instructions. Escorted to pov via w/c. 

## 2024-02-22 NOTE — Progress Notes (Signed)
    Subjective:  Patient reports pain as mild.  Denies N/V/CP/SOB. Eager to go home.  Objective:   VITALS:   Vitals:   02/21/24 1908 02/21/24 2101 02/22/24 0002 02/22/24 0429  BP: (!) 108/92 (!) 109/59 (!) 124/54 (!) 111/58  Pulse: 62 (!) 56 72 67  Resp: 16 16 18 16   Temp: (!) 97.5 F (36.4 C) 97.6 F (36.4 C) 98 F (36.7 C) 98.1 F (36.7 C)  TempSrc: Oral Oral Oral Oral  SpO2: 98% 96% (!) 88% 93%  Weight:      Height:  5' 5 (1.651 m)      NAD ABD soft Sensation intact distally Intact pulses distally Dorsiflexion/Plantar flexion intact Incision: dressing C/D/I Compartment soft   Lab Results  Component Value Date   WBC 11.0 (H) 02/22/2024   HGB 10.0 (L) 02/22/2024   HCT 34.1 (L) 02/22/2024   MCV 82.6 02/22/2024   PLT 131 (L) 02/22/2024   BMET    Component Value Date/Time   NA 137 02/22/2024 0324   K 4.4 02/22/2024 0324   CL 105 02/22/2024 0324   CO2 24 02/22/2024 0324   GLUCOSE 166 (H) 02/22/2024 0324   BUN 18 02/22/2024 0324   CREATININE 1.10 (H) 02/22/2024 0324   CALCIUM 8.6 (L) 02/22/2024 0324   GFRNONAA 53 (L) 02/22/2024 0324     Assessment/Plan: 1 Day Post-Op   Principal Problem:   S/P total left hip arthroplasty   WBAT with walker DVT ppx: apixaban + IVC filter, SCDs, TEDS PO pain control PT/OT Mild elevation in Cr: likely due to volume loss and third spacing, d/c ketorolac, encourage PO hydration, no h/o CKD, will recheck at 1500 Dispo: d/c home with HEP when Cr ok and clears PT   Regina Munoz 02/22/2024, 8:03 AM   Regina Shoals, MD [phone] [facility] is now [facility]  [place] [address], [place], [place] [address] Phone: [phone] [email or web address] Facebook  [employer]

## 2024-02-22 NOTE — Discharge Summary (Signed)
 Physician Discharge Summary  Patient ID: Regina Munoz MRN: [id number] DOB/AGE: 72-Mar-1952 72 y.o.  Admit date: 02/21/2024 Discharge date: 02/22/2024  Admission Diagnoses:  S/P total left hip arthroplasty  Discharge Diagnoses:  Principal Problem:   S/P total left hip arthroplasty   Past Medical History:  Diagnosis Date   Anxiety    Arthritis    Asthma    Complication of anesthesia    COVID-19    Depression    Diabetes mellitus without complication (HCC)    DVT (deep venous thrombosis) (HCC)    Dysrhythmia    palpitations ->20 yrs stress test neg nothing since   GERD (gastroesophageal reflux disease)    Headache    Pneumonia    PONV (postoperative nausea and vomiting)     Surgeries: Procedure(s): ARTHROPLASTY, HIP, TOTAL, ANTERIOR APPROACH on 02/21/2024   Consultants (if any):   Discharged Condition: Improved  Hospital Course: Regina Munoz is an 73 y.o. female who was admitted 02/21/2024 with a diagnosis of S/P total left hip arthroplasty and went to the operating room on 02/21/2024 and underwent the above named procedures.    She was given perioperative antibiotics:  Anti-infectives (From admission, onward)    Start     Dose/Rate Route Frequency Ordered Stop   02/22/24 0600  ceFAZolin  (ANCEF ) IVPB 2g/100 mL premix        2 g 200 mL/hr over 30 Minutes Intravenous On call to O.R. 02/21/24 1222 02/21/24 1503   02/21/24 2100  ceFAZolin  (ANCEF ) IVPB 2g/100 mL premix        2 g 200 mL/hr over 30 Minutes Intravenous Every 6 hours 02/21/24 1912 02/22/24 0418       She was given sequential compression devices, early ambulation, and apixaban (+ temporary IVC filter) for DVT prophylaxis.  She benefited maximally from the hospital stay and there were no complications.    Recent vital signs:  Vitals:   02/22/24 0912 02/22/24 1410  BP: (!) 141/53 97/86  Pulse: 87 82  Resp: 16 16  Temp: 97.8 F (36.6 C) 98 F (36.7 C)  SpO2: 93% 100%    Recent laboratory  studies:  Lab Results  Component Value Date   HGB 10.0 (L) 02/22/2024   HGB 13.0 02/16/2024   HGB 12.9 02/12/2024   Lab Results  Component Value Date   WBC 11.0 (H) 02/22/2024   PLT 131 (L) 02/22/2024   Lab Results  Component Value Date   INR 1.0 05/16/2019   Lab Results  Component Value Date   NA 137 02/22/2024   K 4.4 02/22/2024   CL 105 02/22/2024   CO2 24 02/22/2024   BUN 18 02/22/2024   CREATININE 1.10 (H) 02/22/2024   GLUCOSE 166 (H) 02/22/2024     Allergies as of 02/22/2024   No Known Allergies      Medication List     STOP taking these medications    diclofenac  Sodium 1 % Gel Commonly known as: VOLTAREN        TAKE these medications    acetaminophen  500 MG tablet Commonly known as: TYLENOL  Take 1,000 mg by mouth every 6 (six) hours as needed for mild pain or moderate pain.   albuterol  108 (90 Base) MCG/ACT inhaler Commonly known as: VENTOLIN  HFA Inhale 2 puffs into the lungs every 4 (four) hours as needed for wheezing or shortness of breath.   aspirin  81 MG chewable tablet Commonly known as: Aspirin  Childrens Chew 1 tablet (81 mg total) by mouth 2 (two) times  daily with a meal.   cetirizine 10 MG tablet Commonly known as: ZYRTEC Take 10 mg by mouth daily.   chlorhexidine  4 % external liquid Commonly known as: HIBICLENS  Apply 15 mLs (1 Application total) topically as directed for 30 doses. Use as directed daily for 5 days every other week for 6 weeks.   cholecalciferol  1000 units tablet Commonly known as: VITAMIN D  Take 1,000 Units by mouth daily.   docusate sodium  100 MG capsule Commonly known as: Colace Take 1 capsule (100 mg total) by mouth 2 (two) times daily.   Farxiga 10 MG Tabs tablet Generic drug: dapagliflozin propanediol Take 10 mg by mouth daily.   HYDROcodone -acetaminophen  5-325 MG tablet Commonly known as: NORCO/VICODIN Take 1 tablet by mouth every 4 (four) hours as needed for up to 7 days for moderate pain (pain score  4-6) or severe pain (pain score 7-10).   metFORMIN  500 MG tablet Commonly known as: GLUCOPHAGE  Take 500 mg by mouth daily.   mupirocin ointment 2 % Commonly known as: BACTROBAN Place 1 Application into the nose 2 (two) times daily for 60 doses. Use as directed 2 times daily for 5 days every other week for 6 weeks.   nystatin  powder Commonly known as: MYCOSTATIN /NYSTOP  Apply 1 Application topically 3 (three) times daily.   omeprazole 20 MG capsule Commonly known as: PRILOSEC Take 20 mg by mouth daily.   ondansetron  4 MG tablet Commonly known as: Zofran  Take 1 tablet (4 mg total) by mouth every 8 (eight) hours as needed for nausea or vomiting.   polyethylene glycol 17 g packet Commonly known as: MiraLax  Take 17 g by mouth daily as needed for mild constipation or moderate constipation.   senna 8.6 MG Tabs tablet Commonly known as: SENOKOT Take 2 tablets (17.2 mg total) by mouth at bedtime for 15 days.   sertraline 25 MG tablet Commonly known as: ZOLOFT Take 25 mg by mouth daily.          WEIGHT BEARING   Weight bearing as tolerated with assist device (walker, cane, etc) as directed, use it as long as suggested by your surgeon or therapist, typically at least 4-6 weeks.   EXERCISES  Results after joint replacement surgery are often greatly improved when you follow the exercise, range of motion and muscle strengthening exercises prescribed by your doctor. Safety measures are also important to protect the joint from further injury. Any time any of these exercises cause you to have increased pain or swelling, decrease what you are doing until you are comfortable again and then slowly increase them. If you have problems or questions, call your caregiver or physical therapist for advice.   Rehabilitation is important following a joint replacement. After just a few days of immobilization, the muscles of the leg can become weakened and shrink (atrophy).  These exercises are  designed to build up the tone and strength of the thigh and leg muscles and to improve motion. Often times heat used for twenty to thirty minutes before working out will loosen up your tissues and help with improving the range of motion but do not use heat for the first two weeks following surgery (sometimes heat can increase post-operative swelling).   These exercises can be done on a training (exercise) mat, on the floor, on a table or on a bed. Use whatever works the best and is most comfortable for you.    Use music or television while you are exercising so that the exercises are a pleasant  break in your day. This will make your life better with the exercises acting as a break in your routine that you can look forward to.   Perform all exercises about fifteen times, three times per day or as directed.  You should exercise both the operative leg and the other leg as well.  Exercises include:   Quad Sets - Tighten up the muscle on the front of the thigh (Quad) and hold for 5-10 seconds.   Straight Leg Raises - With your knee straight (if you were given a brace, keep it on), lift the leg to 60 degrees, hold for 3 seconds, and slowly lower the leg.  Perform this exercise against resistance later as your leg gets stronger.  Leg Slides: Lying on your back, slowly slide your foot toward your buttocks, bending your knee up off the floor (only go as far as is comfortable). Then slowly slide your foot back down until your leg is flat on the floor again.  Angel Wings: Lying on your back spread your legs to the side as far apart as you can without causing discomfort.  Hamstring Strength:  Lying on your back, push your heel against the floor with your leg straight by tightening up the muscles of your buttocks.  Repeat, but this time bend your knee to a comfortable angle, and push your heel against the floor.  You may put a pillow under the heel to make it more comfortable if necessary.   A rehabilitation program  following joint replacement surgery can speed recovery and prevent re-injury in the future due to weakened muscles. Contact your doctor or a physical therapist for more information on knee rehabilitation.    CONSTIPATION  Constipation is defined medically as fewer than three stools per week and severe constipation as less than one stool per week.  Even if you have a regular bowel pattern at home, your normal regimen is likely to be disrupted due to multiple reasons following surgery.  Combination of anesthesia, postoperative narcotics, change in appetite and fluid intake all can affect your bowels.   YOU MUST use at least one of the following options; they are listed in order of increasing strength to get the job done.  They are all available over the counter, and you may need to use some, POSSIBLY even all of these options:    Drink plenty of fluids (prune juice may be helpful) and high fiber foods Colace 100 mg by mouth twice a day  Senokot for constipation as directed and as needed Dulcolax (bisacodyl ), take with full glass of water  Miralax  (polyethylene glycol) once or twice a day as needed.  If you have tried all these things and are unable to have a bowel movement in the first 3-4 days after surgery call either your surgeon or your primary doctor.    If you experience loose stools or diarrhea, hold the medications until you stool forms back up.  If your symptoms do not get better within 1 week or if they get worse, check with your doctor.  If you experience the worst abdominal pain ever or develop nausea or vomiting, please contact the office immediately for further recommendations for treatment.   ITCHING:  If you experience itching with your medications, try taking only a single pain pill, or even half a pain pill at a time.  You can also use Benadryl  over the counter for itching or also to help with sleep.   TED HOSE STOCKINGS:  Use stockings  on both legs until for at least 2 weeks  or as directed by physician office. They may be removed at night for sleeping.  MEDICATIONS:  See your medication summary on the "After Visit Summary" that nursing will review with you.  You may have some home medications which will be placed on hold until you complete the course of blood thinner medication.  It is important for you to complete the blood thinner medication as prescribed.  PRECAUTIONS:  If you experience chest pain or shortness of breath - call 911 immediately for transfer to the hospital emergency department.   If you develop a fever greater that 101 F, purulent drainage from wound, increased redness or drainage from wound, foul odor from the wound/dressing, or calf pain - CONTACT YOUR SURGEON.                                                   FOLLOW-UP APPOINTMENTS:  If you do not already have a post-op appointment, please call the office for an appointment to be seen by your surgeon.  Guidelines for how soon to be seen are listed in your "After Visit Summary", but are typically between 1-4 weeks after surgery.  OTHER INSTRUCTIONS:   Knee Replacement:  Do not place pillow under knee, focus on keeping the knee straight while resting. CPM instructions: 0-90 degrees, 2 hours in the morning, 2 hours in the afternoon, and 2 hours in the evening. Place foam block, curve side up under heel at all times except when in CPM or when walking.  DO NOT modify, tear, cut, or change the foam block in any way.   MAKE SURE YOU:  Understand these instructions.  Get help right away if you are not doing well or get worse.    Thank you for letting us  be a part of your medical care team.  It is a privilege we respect greatly.  We hope these instructions will help you stay on track for a fast and full recovery!   Diagnostic Studies: DG Pelvis Portable Result Date: 02/21/2024 CLINICAL DATA:  Post total left hip arthroplasty. EXAM: PORTABLE PELVIS 1-2 VIEWS COMPARISON:  Left hip/femur 09/19/2019  FINDINGS: Interval placement of a left total hip arthroplasty using non cemented components. Components appear well seated. No evidence of acute fracture or dislocation. Soft tissue gas is consistent with recent surgery. A right total hip arthroplasty is also demonstrated with intact appearance. IMPRESSION: Left total hip arthroplasty. Components appear well seated without acute complication suggested. Electronically Signed   By: Elsie Gravely M.D.   On: 02/21/2024 19:22   DG HIP UNILAT WITH PELVIS 1V LEFT Result Date: 02/21/2024 CLINICAL DATA:  Elective surgery. Intraoperative left hip arthroplasty EXAM: DG HIP (WITH OR WITHOUT PELVIS) 1V*L* COMPARISON:  09/19/2019 FINDINGS: Intraoperative fluoroscopy is obtained for surgical control purposes. Fluoroscopy time recorded at 18 seconds. Dose 4.11 mGy. Four spot fluoroscopic images are obtained. Spot fluoroscopic images demonstrate a pre-existing right hip arthroplasty and subsequent placement of a left hip arthroplasty. No dislocation. IMPRESSION: Intraoperative fluoroscopy is obtained for surgical control purposes demonstrating placement of a left hip arthroplasty. Electronically Signed   By: Elsie Gravely M.D.   On: 02/21/2024 19:20   DG C-Arm 1-60 Min-No Report Result Date: 02/21/2024 Fluoroscopy was utilized by the requesting physician.  No radiographic interpretation.   DG C-Arm 1-60  Min-No Report Result Date: 02/21/2024 Fluoroscopy was utilized by the requesting physician.  No radiographic interpretation.   PERIPHERAL VASCULAR CATHETERIZATION Result Date: 02/12/2024 Patient name: Regina Munoz MRN: [id number] DOB: 03/02/1951 Sex: female 02/12/2024 Pre-operative Diagnosis: History of extensive DVT and PE Post-operative diagnosis:  Same Surgeon:  Penne BROCKS. [name], MD Procedure Performed: 1.  Percutaneous ultrasound-guided cannulation right greater saphenous vein 2.  IVC venogram 3.  Placement of Cook Celect IVC filter and infrarenal IVC 4.   Moderate sedation with fentanyl  and Versed  for 9 minutes Indications: 73 year old female with history of DVT and PE at the time of COVID-19 with retroperitoneal hemorrhage requiring IVC filter placement and subsequent IVC and iliofemoral thrombectomy.  Filter was then removed.  Patient not on anticoagulation.  She is now planned for left total hip arthroplasty and indicated for IVC filter placement. Findings: The greater saphenous vein was large and patent and compressible and due to the history of DVT I elected to cannulate the greater saphenous vein.  The IVC was patent and the renal veins were identified and the filter was placed in the midline of the IVC just inferior to the renal veins.  Procedure:  The patient was identified in the holding area and taken to room 8.  The patient was then placed supine on the table and prepped and draped in the usual sterile fashion.  A time out was called.  Ultrasound was used to evaluate the right greater saphenous vein.  The area was anesthetized 1% lidocaine  and cannulated with micropuncture needle followed by wire and sheath.  An ultrasound images saved to the permanent record.  Concomitantly we administered fentanyl  and Versed  as moderate sedation and her vital signs were monitored throughout the case.  I placed a Bentson wire under fluoroscopic guidance and then dilated the wire tract and placed the filter introducer sheath.  IVC venogram was performed and the renal veins were identified and marked on the screen.  The filter was then placed just below the renal veins in the midline of the IVC.  Single shot was performed.  The introducer sheath was removed and pressure held until hemostasis was obtained.  She tolerated the procedure without immediate complication. Contrast: 30cc Brandon C. [name], MD [facility] Office: [phone] Pager: [phone]    Disposition: Discharge disposition: 01-Home or Self Care       Discharge  Instructions     Call MD / Call 911   Complete by: As directed    If you experience chest pain or shortness of breath, CALL 911 and be transported to the hospital emergency room.  If you develope a fever above 101 F, pus (white drainage) or increased drainage or redness at the wound, or calf pain, call your surgeon's office.   Constipation Prevention   Complete by: As directed    Drink plenty of fluids.  Prune juice may be helpful.  You may use a stool softener, such as Colace (over the counter) 100 mg twice a day.  Use MiraLax  (over the counter) for constipation as needed.   Diet - low sodium heart healthy   Complete by: As directed    Driving restrictions   Complete by: As directed    No driving for 6 weeks   Increase activity slowly as tolerated   Complete by: As directed    Lifting restrictions   Complete by: As directed    No lifting for 6 weeks   Post-operative opioid taper instructions:   Complete  by: As directed    POST-OPERATIVE OPIOID TAPER INSTRUCTIONS: It is important to wean off of your opioid medication as soon as possible. If you do not need pain medication after your surgery it is ok to stop day one. Opioids include: Codeine, Hydrocodone (Norco, Vicodin), Oxycodone (Percocet, oxycontin ) and hydromorphone  amongst others.  Long term and even short term use of opiods can cause: Increased pain response Dependence Constipation Depression Respiratory depression And more.  Withdrawal symptoms can include Flu like symptoms Nausea, vomiting And more Techniques to manage these symptoms Hydrate well Eat regular healthy meals Stay active Use relaxation techniques(deep breathing, meditating, yoga) Do Not substitute Alcohol to help with tapering If you have been on opioids for less than two weeks and do not have pain than it is ok to stop all together.  Plan to wean off of opioids This plan should start within one week post op of your joint replacement. Maintain the same  interval or time between taking each dose and first decrease the dose.  Cut the total daily intake of opioids by one tablet each day Next start to increase the time between doses. The last dose that should be eliminated is the evening dose.      TED hose   Complete by: As directed    Use stockings (TED hose) for 2 weeks on both leg(s).  You may remove them at night for sleeping.        Follow-up Information     Leigh Valery RAMAN, [place]. Schedule an appointment as soon as possible for a visit in 2 week(s).   Specialty: Orthopedic Surgery Why: For suture removal, For wound re-check Contact information: [address], [address] [place] [address] [phone]                  Signed: Redell PARAS Soliyana Mcchristian 02/22/2024, 5:06 PM

## 2024-02-22 NOTE — Progress Notes (Signed)
   02/22/24 1203  TOC Brief Assessment  Insurance and Status Reviewed  Patient has primary care physician Yes  Home environment has been reviewed home w/ dtr  Prior level of function: independent  Prior/Current Home Services No current home services  Social Drivers of Health Review SDOH reviewed no interventions necessary  Readmission risk has been reviewed Yes  Transition of care needs no transition of care needs at this time   HEP, no DME needs.

## 2024-04-16 ENCOUNTER — Telehealth: Payer: Self-pay

## 2024-04-16 NOTE — Telephone Encounter (Signed)
 Called pt to schedule IVC filter retrieval. She is going to f/u with orthopedics Friday and will call us  after that appt. To schedule.

## 2024-05-21 ENCOUNTER — Other Ambulatory Visit: Payer: Self-pay

## 2024-05-21 DIAGNOSIS — Z86718 Personal history of other venous thrombosis and embolism: Secondary | ICD-10-CM

## 2024-05-27 ENCOUNTER — Other Ambulatory Visit: Payer: Self-pay

## 2024-05-27 ENCOUNTER — Encounter (HOSPITAL_COMMUNITY)
Admission: RE | Disposition: A | Discharge status: Home / Self Care | Payer: Self-pay | Source: Home / Self Care | Attending: Vascular Surgery

## 2024-05-27 ENCOUNTER — Ambulatory Visit (HOSPITAL_COMMUNITY)
Admission: RE | Admit: 2024-05-27 | Discharge: 2024-05-27 | Disposition: A | Discharge status: Home / Self Care | Attending: Vascular Surgery | Admitting: Vascular Surgery

## 2024-05-27 DIAGNOSIS — Z96653 Presence of artificial knee joint, bilateral: Secondary | ICD-10-CM | POA: Insufficient documentation

## 2024-05-27 DIAGNOSIS — Z86718 Personal history of other venous thrombosis and embolism: Secondary | ICD-10-CM | POA: Insufficient documentation

## 2024-05-27 DIAGNOSIS — Z4589 Encounter for adjustment and management of other implanted devices: Secondary | ICD-10-CM | POA: Insufficient documentation

## 2024-05-27 DIAGNOSIS — Z96641 Presence of right artificial hip joint: Secondary | ICD-10-CM | POA: Diagnosis not present

## 2024-05-27 HISTORY — PX: VENOUS ANGIOPLASTY: CATH118376

## 2024-05-27 HISTORY — PX: IVC FILTER REMOVAL: CATH118246

## 2024-05-27 LAB — POCT I-STAT, CHEM 8
BUN: 19 mg/dL (ref 8–23)
Calcium, Ion: 1.25 mmol/L (ref 1.15–1.40)
Chloride: 103 mmol/L (ref 98–111)
Creatinine, Ser: 1 mg/dL (ref 0.44–1.00)
Glucose, Bld: 138 mg/dL — ABNORMAL HIGH (ref 70–99)
HCT: 36 % (ref 36.0–46.0)
Hemoglobin: 12.2 g/dL (ref 12.0–15.0)
Potassium: 4.4 mmol/L (ref 3.5–5.1)
Sodium: 141 mmol/L (ref 135–145)
TCO2: 24 mmol/L (ref 22–32)

## 2024-05-27 LAB — GLUCOSE, CAPILLARY: Glucose-Capillary: 103 mg/dL — ABNORMAL HIGH (ref 70–99)

## 2024-05-27 SURGERY — IVC FILTER REMOVAL
Anesthesia: LOCAL

## 2024-05-27 MED ORDER — MIDAZOLAM HCL 2 MG/2ML IJ SOLN
INTRAMUSCULAR | Status: AC
  Filled 2024-05-27: qty 2

## 2024-05-27 MED ORDER — MIDAZOLAM HCL (PF) 2 MG/2ML IJ SOLN
INTRAMUSCULAR | Status: DC | PRN
  Administered 2024-05-27: 1 mg via INTRAVENOUS

## 2024-05-27 MED ORDER — FENTANYL CITRATE (PF) 100 MCG/2ML IJ SOLN
INTRAMUSCULAR | Status: DC | PRN
  Administered 2024-05-27: 50 ug via INTRAVENOUS

## 2024-05-27 MED ORDER — IODIXANOL 320 MG/ML IV SOLN
INTRAVENOUS | Status: DC | PRN
  Administered 2024-05-27: 6 mL via INTRAVENOUS

## 2024-05-27 MED ORDER — SODIUM CHLORIDE 0.9 % IV SOLN: INTRAVENOUS | Status: DC

## 2024-05-27 MED ORDER — HEPARIN (PORCINE) IN NACL 1000-0.9 UT/500ML-% IV SOLN
INTRAVENOUS | Status: DC | PRN
  Administered 2024-05-27: 1000 mL

## 2024-05-27 MED ORDER — FENTANYL CITRATE (PF) 100 MCG/2ML IJ SOLN
INTRAMUSCULAR | Status: AC
  Filled 2024-05-27: qty 2

## 2024-05-27 MED ORDER — LIDOCAINE HCL (PF) 1 % IJ SOLN
INTRAMUSCULAR | Status: DC | PRN
  Administered 2024-05-27: 10 mL

## 2024-05-27 MED ORDER — LIDOCAINE HCL (PF) 1 % IJ SOLN
INTRAMUSCULAR | Status: AC
  Filled 2024-05-27: qty 30

## 2024-05-27 SURGICAL SUPPLY — 6 items
PACK CARDIAC CATHETERIZATION (CUSTOM PROCEDURE TRAY) IMPLANT
SET CLOVERSNARE FLT RETRIEVAL (MISCELLANEOUS) IMPLANT
SET MICROPUNCTURE 5F STIFF (MISCELLANEOUS) IMPLANT
SHEATH PROBE COVER 6X72 (BAG) IMPLANT
WIRE BENTSON .035X145CM (WIRE) IMPLANT
WIRE MICROINTRODUCER 60CM (WIRE) IMPLANT

## 2024-05-27 NOTE — H&P (Signed)
 "   History of Present Illness: 74 y.o. 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  She ultimately had IVC filter placement and subsequent treatment of ilio caval and iliofemoral DVT.  The filter was then removed and she did continue DOAC without issues for a couple years but states that she has been off of blood thinners for several years now.  She does have a remote history of right hip and bilateral knee replacements now s/p for left knee replacement with Dr. Fidel.  She is no longer on blood thinners.  An IVC filter was placed in September of this year and she is now here to consider removal.       Past Medical History:  Diagnosis Date   Anxiety     Arthritis     Asthma     Complication of anesthesia     COVID-19     Diabetes mellitus without complication (HCC)     Dysrhythmia      palpitations ->20 yrs stress test neg nothing since   GERD (gastroesophageal reflux disease)     Headache     PONV (postoperative nausea and vomiting)                 Past Surgical History:  Procedure Laterality Date   ABDOMINAL HYSTERECTOMY       ANTERIOR CERVICAL DECOMP/DISCECTOMY FUSION N/A 02/17/2016    Procedure: ANTERIOR CERVICAL DECOMPRESSION/DISCECTOMY FUSION 1 LEVEL C4-5;  Surgeon: Donaciano Sprang, MD;  Location: [facility] OR;  Service: Orthopedics;  Laterality: N/A;   EYE SURGERY Bilateral      cataracts   HIP CLOSED REDUCTION Right 12/24/2012    Procedure: CLOSED REDUCTION HIP;  Surgeon: Oneil Rodgers Priestly, MD;  Location: [facility];  Service: Orthopedics;  Laterality: Right;   IR IVC FILTER PLMT / S&I /IMG GUID/MOD SED   04/29/2019   IR IVC FILTER RETRIEVAL / S&I /IMG GUID/MOD SED   01/21/2020   IR PTA VENOUS EXCEPT DIALYSIS CIRCUIT   05/16/2019   IR RADIOLOGIST EVAL & MGMT   06/18/2019   IR RADIOLOGIST EVAL & MGMT   01/07/2020   IR THROMBECT SEC MECH MOD SED   05/16/2019   IR THROMBECT VENO MECH MOD SED    05/16/2019   IR US  GUIDE VASC ACCESS LEFT   05/16/2019   IR US  GUIDE VASC ACCESS RIGHT   05/16/2019   IR US  GUIDE VASC ACCESS RIGHT   05/16/2019   IR VENO/EXT/BI   05/16/2019   IR VENOCAVAGRAM IVC   05/16/2019   JOINT REPLACEMENT   2002    right and left knees   SHOULDER ARTHROSCOPY Left     TOTAL HIP ARTHROPLASTY Right      2002          Allergies  No Known Allergies            Prior to Admission medications   Medication Sig Start Date End Date Taking? Authorizing Provider  acetaminophen  (TYLENOL ) 500 MG tablet Take 1,000 mg by mouth every 6 (six) hours as needed for mild pain or moderate pain.        [provider]  albuterol  (VENTOLIN  HFA) 108 (90 Base) MCG/ACT inhaler Inhale 2 puffs into the lungs every 4 (four) hours as needed for wheezing or shortness of breath. Patient taking differently: Inhale 1-2 puffs into the lungs every 4 (four) hours as needed for wheezing or shortness  of breath.  05/02/19     Regalado, Belkys A, MD  blood glucose meter kit and supplies KIT Dispense based on patient and insurance preference. Use up to four times daily as directed. (FOR ICD-9 250.00, 250.01). 05/02/19     Regalado, Belkys A, MD  buPROPion  (WELLBUTRIN  SR) 150 MG 12 hr tablet Take 150 mg by mouth daily.       [provider]  cetirizine (ZYRTEC) 10 MG tablet Take 10 mg by mouth daily. 02/13/19     [provider]  cholecalciferol  (VITAMIN D ) 1000 UNITS tablet Take 1,000 Units by mouth daily.       [provider]  diclofenac  Sodium (VOLTAREN ) 1 % GEL Apply 2 g topically daily as needed (for pain).        [provider]  HYDROcodone -acetaminophen  (NORCO) 10-325 MG tablet Take 1 tablet by mouth daily as needed for moderate pain.  10/17/19     [provider]  Insulin  Glargine (LANTUS ) 100 UNIT/ML Solostar Pen Inject 10 Units into the skin daily. Patient not taking: Reported on 01/14/2020 05/02/19     Regalado, Owen A, MD  metFORMIN   (GLUCOPHAGE ) 500 MG tablet Take 500 mg by mouth 2 (two) times daily with a meal.        [provider]  omeprazole (PRILOSEC) 20 MG capsule Take 20 mg by mouth daily.       [provider]      Social History         Socioeconomic History   Marital status: Married      Spouse name: Not on file   Number of children: Not on file   Years of education: Not on file   Highest education level: Not on file  Occupational History   Not on file  Tobacco Use   Smoking status: Never   Smokeless tobacco: Never  Vaping Use   Vaping status: Never Used  Substance and Sexual Activity   Alcohol  use: No   Drug use: No   Sexual activity: Not on file  Other Topics Concern   Not on file  Social History Narrative   Not on file    Social Drivers of Health        Financial Resource Strain: Low Risk  (04/22/2019)    Overall Financial Resource Strain (CARDIA)     Difficulty of Paying Living Expenses: Not hard at all  Food Insecurity: No Food Insecurity (04/22/2019)    Hunger Vital Sign     Worried About Running Out of Food in the Last Year: Never true     Ran Out of Food in the Last Year: Never true  Transportation Needs: No Transportation Needs (04/22/2019)    PRAPARE - Therapist, Art (Medical): No     Lack of Transportation (Non-Medical): No  Physical Activity: Unknown (04/22/2019)    Exercise Vital Sign     Days of Exercise per Week: 1 day     Minutes of Exercise per Session: Not on file  Stress: Not on file  Social Connections: Not on file  Intimate Partner Violence: Not on file        No family history on file.       Review of Systems  Constitutional: Negative.   HENT: Negative.    Eyes: Negative.   Cardiovascular:  Positive for leg swelling.  Gastrointestinal: Negative.   Musculoskeletal:  Positive for joint pain.  Skin: Negative.   Neurological: Negative.  Endo/Heme/Allergies: Negative.   Psychiatric/Behavioral: Negative.            Physical Examination     Vitals:   05/27/24 1045  BP: (!) 113/58  Pulse: 82  Temp: 98.1 F (36.7 C)  SpO2: 95%    Physical Exam HENT:     Head: Normocephalic.     Nose: Nose normal.     Mouth/Throat:     Mouth: Mucous membranes are moist.  Cardiovascular:     Rate and Rhythm: Normal rate.  Pulmonary:     Effort: Pulmonary effort is normal.  Musculoskeletal:     Cervical back: Normal range of motion.     Right lower leg: Edema present.     Left lower leg: Edema present.  Skin:    General: Skin is warm.     Capillary Refill: Capillary refill takes less than 2 seconds.  Neurological:     General: No focal deficit present.     Mental Status: She is alert.  Psychiatric:        Mood and Affect: Mood normal.         CBC Labs (Brief)          Component Value Date/Time    WBC 7.1 05/04/2020 1416    WBC 7.4 09/19/2019 1858    RBC 5.11 05/04/2020 1416    HGB 13.5 05/04/2020 1416    HCT 41.7 05/04/2020 1416    PLT 181 05/04/2020 1416    MCV 81.6 05/04/2020 1416    MCH 26.4 05/04/2020 1416    MCHC 32.4 05/04/2020 1416    RDW 17.2 (H) 05/04/2020 1416    LYMPHSABS 2.8 05/04/2020 1416    MONOABS 0.5 05/04/2020 1416    EOSABS 0.4 05/04/2020 1416    BASOSABS 0.1 05/04/2020 1416        BMET Labs (Brief)          Component Value Date/Time    NA 138 01/21/2020 1036    K 5.4 (H) 01/21/2020 1036    CL 105 01/21/2020 1036    CO2 25 01/21/2020 1036    GLUCOSE 101 (H) 01/21/2020 1036    BUN 18 01/21/2020 1036    CREATININE 0.84 01/21/2020 1036    CALCIUM 9.3 01/21/2020 1036    GFRNONAA >60 01/21/2020 1036    GFRAA >60 01/21/2020 1036        COAGS: Recent Labs       Lab Results  Component Value Date    INR 1.0 05/16/2019    INR 1.2 05/14/2019    INR 1.4 (H) 04/27/2019          Non-Invasive Vascular Imaging:   No new studies     ASSESSMENT/PLAN: This is a 74 y.o. female history of DVT and IVC filter status post removal as above and then  replacement in this September during workup for left total hip arthroplasty..  She is now status post left total hip arthroplasty with high risk for DVT and IVC filter indwelling.  We have discussed removing the IVC filter and the difficulty this may close given history as noted above.  She demonstrates good understanding consent was signed.   Lillyan Hitson C. [name], MD [facility] Office: [phone] Pager: [phone] "

## 2024-05-27 NOTE — Op Note (Signed)
" ° ° °  Patient name: Regina Munoz MRN: [id number] DOB: 12/27/1950 Sex: female  05/27/2024 Pre-operative Diagnosis: History of IVC filter placement Post-operative diagnosis:  Same Surgeon:  Penne BROCKS. [name], MD Procedure Performed: 1.  Percutaneous ultrasound-guided cannulation right internal jugular vein 2.  IVC venogram 3.  IVC filter retrieval 4.  Moderate sedation with fentanyl  and Versed  for 10 minutes   Indications: 74 year old female with history of retroperitoneal hemorrhage while requiring anticoagulation during COVID initially had an IVC filter placed which then occluded and was subsequently removed.  She was now scheduled for left total knee replacement and IVC filter was placed to protect against thrombotic complications.  She has recovered well from orthopedic surgery and is now indicated for filter retrieval.  Findings: The filter hook was against the left sidewall of the IVC but it was snared without complication and the filter was removed intact and the IVC was patent without extravasation or stenosis.   Procedure:  The patient was identified in the holding area and taken to room 8.  The patient was then placed supine on the table and prepped and draped in the usual sterile fashion.  A time out was called.  Ultrasound was used to evaluate the right IJ.  The right neck was anesthetized 1% lidocaine  and was cannulated with micropuncture needle followed by wire and a sheath.  An ultrasound images saved to the permanent record.  Concomitantly we administered fentanyl  and Versed  as moderate sedation her vital signs were monitored throughout the case.  We placed a Bentson wire into the IVC under fluoroscopic guidance and the wire tract was dilated and the filter retrieval sheath was then placed under fluoroscopic guidance just above the filter.  The hook was snared easily and the filter was removed intact with the sheath being passed over top of the filter.  Ablation venogram demonstrated  no extravasation or stenosis of the IVC and the sheath was removed and pressure was held in the right neck until hemostasis was obtained.  She tolerated the procedure without immediate complication.   Contrast: 6cc  Kairi Tufo C. [name], MD [facility] Office: [phone] Pager: [phone]   "

## 2024-05-28 ENCOUNTER — Encounter (HOSPITAL_COMMUNITY): Payer: Self-pay | Admitting: Vascular Surgery
